# Patient Record
Sex: Male | Born: 1965 | Race: Black or African American | Marital: Single | Smoking: Current every day smoker
Health system: Southern US, Community
[De-identification: ages and names within clinical notes are randomized; demographics above are authoritative.]

## PROBLEM LIST (undated history)

## (undated) ENCOUNTER — Emergency Department (HOSPITAL_COMMUNITY): Payer: Medicare Other | Source: Home / Self Care

## (undated) DIAGNOSIS — R0600 Dyspnea, unspecified: Secondary | ICD-10-CM

## (undated) DIAGNOSIS — R634 Abnormal weight loss: Secondary | ICD-10-CM

## (undated) DIAGNOSIS — R140 Abdominal distension (gaseous): Secondary | ICD-10-CM

## (undated) DIAGNOSIS — W3400XA Accidental discharge from unspecified firearms or gun, initial encounter: Secondary | ICD-10-CM

## (undated) DIAGNOSIS — K219 Gastro-esophageal reflux disease without esophagitis: Secondary | ICD-10-CM

## (undated) DIAGNOSIS — R519 Headache, unspecified: Secondary | ICD-10-CM

## (undated) DIAGNOSIS — R51 Headache: Secondary | ICD-10-CM

## (undated) DIAGNOSIS — S31139A Puncture wound of abdominal wall without foreign body, unspecified quadrant without penetration into peritoneal cavity, initial encounter: Secondary | ICD-10-CM

## (undated) DIAGNOSIS — M7989 Other specified soft tissue disorders: Secondary | ICD-10-CM

## (undated) DIAGNOSIS — R509 Fever, unspecified: Secondary | ICD-10-CM

## (undated) DIAGNOSIS — Z9289 Personal history of other medical treatment: Secondary | ICD-10-CM

## (undated) DIAGNOSIS — R112 Nausea with vomiting, unspecified: Secondary | ICD-10-CM

## (undated) DIAGNOSIS — Z765 Malingerer [conscious simulation]: Secondary | ICD-10-CM

## (undated) DIAGNOSIS — L0291 Cutaneous abscess, unspecified: Secondary | ICD-10-CM

## (undated) DIAGNOSIS — K859 Acute pancreatitis without necrosis or infection, unspecified: Secondary | ICD-10-CM

## (undated) DIAGNOSIS — R197 Diarrhea, unspecified: Secondary | ICD-10-CM

## (undated) DIAGNOSIS — Z87442 Personal history of urinary calculi: Secondary | ICD-10-CM

## (undated) DIAGNOSIS — G8929 Other chronic pain: Secondary | ICD-10-CM

## (undated) DIAGNOSIS — R109 Unspecified abdominal pain: Secondary | ICD-10-CM

## (undated) HISTORY — DX: Fever, unspecified: R50.9

## (undated) HISTORY — DX: Headache: R51

## (undated) HISTORY — PX: HERNIA REPAIR: SHX51

## (undated) HISTORY — DX: Abnormal weight loss: R63.4

## (undated) HISTORY — DX: Headache, unspecified: R51.9

## (undated) HISTORY — DX: Puncture wound of abdominal wall without foreign body, unspecified quadrant without penetration into peritoneal cavity, initial encounter: S31.139A

## (undated) HISTORY — DX: Cutaneous abscess, unspecified: L02.91

## (undated) HISTORY — DX: Other specified soft tissue disorders: M79.89

## (undated) HISTORY — DX: Diarrhea, unspecified: R19.7

## (undated) HISTORY — DX: Nausea with vomiting, unspecified: R11.2

## (undated) HISTORY — DX: Accidental discharge from unspecified firearms or gun, initial encounter: W34.00XA

## (undated) HISTORY — DX: Abdominal distension (gaseous): R14.0

## (undated) HISTORY — PX: ABDOMINAL SURGERY: SHX537

---

## 2005-12-04 ENCOUNTER — Emergency Department (HOSPITAL_COMMUNITY): Admission: EM | Admit: 2005-12-04 | Discharge: 2005-12-04 | Payer: Self-pay | Admitting: Emergency Medicine

## 2006-06-27 ENCOUNTER — Emergency Department (HOSPITAL_COMMUNITY): Admission: EM | Admit: 2006-06-27 | Discharge: 2006-06-27 | Payer: Self-pay | Admitting: Emergency Medicine

## 2007-03-15 ENCOUNTER — Emergency Department (HOSPITAL_COMMUNITY): Admission: EM | Admit: 2007-03-15 | Discharge: 2007-03-15 | Payer: Self-pay | Admitting: Emergency Medicine

## 2007-03-27 ENCOUNTER — Emergency Department (HOSPITAL_COMMUNITY): Admission: EM | Admit: 2007-03-27 | Discharge: 2007-03-27 | Payer: Self-pay | Admitting: Emergency Medicine

## 2007-04-04 ENCOUNTER — Emergency Department (HOSPITAL_COMMUNITY): Admission: EM | Admit: 2007-04-04 | Discharge: 2007-04-05 | Payer: Self-pay | Admitting: Emergency Medicine

## 2007-04-06 ENCOUNTER — Emergency Department (HOSPITAL_COMMUNITY): Admission: EM | Admit: 2007-04-06 | Discharge: 2007-04-07 | Payer: Self-pay | Admitting: Emergency Medicine

## 2007-04-17 ENCOUNTER — Emergency Department (HOSPITAL_COMMUNITY): Admission: EM | Admit: 2007-04-17 | Discharge: 2007-04-18 | Payer: Self-pay | Admitting: Emergency Medicine

## 2007-04-26 ENCOUNTER — Emergency Department (HOSPITAL_COMMUNITY): Admission: EM | Admit: 2007-04-26 | Discharge: 2007-04-26 | Payer: Self-pay | Admitting: Emergency Medicine

## 2007-04-28 ENCOUNTER — Inpatient Hospital Stay (HOSPITAL_COMMUNITY): Admission: EM | Admit: 2007-04-28 | Discharge: 2007-05-01 | Payer: Self-pay | Admitting: Emergency Medicine

## 2007-05-03 ENCOUNTER — Ambulatory Visit: Payer: Self-pay | Admitting: Gastroenterology

## 2007-05-13 ENCOUNTER — Emergency Department (HOSPITAL_COMMUNITY): Admission: EM | Admit: 2007-05-13 | Discharge: 2007-05-13 | Payer: Self-pay | Admitting: Emergency Medicine

## 2007-06-05 ENCOUNTER — Emergency Department (HOSPITAL_COMMUNITY): Admission: EM | Admit: 2007-06-05 | Discharge: 2007-06-05 | Payer: Self-pay | Admitting: Emergency Medicine

## 2007-06-06 ENCOUNTER — Emergency Department (HOSPITAL_COMMUNITY): Admission: EM | Admit: 2007-06-06 | Discharge: 2007-06-06 | Payer: Self-pay | Admitting: Emergency Medicine

## 2007-06-08 ENCOUNTER — Emergency Department (HOSPITAL_COMMUNITY): Admission: EM | Admit: 2007-06-08 | Discharge: 2007-06-08 | Payer: Self-pay | Admitting: Emergency Medicine

## 2007-06-09 ENCOUNTER — Emergency Department (HOSPITAL_COMMUNITY): Admission: EM | Admit: 2007-06-09 | Discharge: 2007-06-09 | Payer: Self-pay | Admitting: Emergency Medicine

## 2007-06-10 ENCOUNTER — Inpatient Hospital Stay (HOSPITAL_COMMUNITY): Admission: EM | Admit: 2007-06-10 | Discharge: 2007-06-11 | Payer: Self-pay | Admitting: Emergency Medicine

## 2007-07-14 ENCOUNTER — Emergency Department (HOSPITAL_COMMUNITY): Admission: EM | Admit: 2007-07-14 | Discharge: 2007-07-14 | Payer: Self-pay | Admitting: Emergency Medicine

## 2007-07-15 ENCOUNTER — Emergency Department (HOSPITAL_COMMUNITY): Admission: EM | Admit: 2007-07-15 | Discharge: 2007-07-15 | Payer: Self-pay | Admitting: Emergency Medicine

## 2007-07-17 ENCOUNTER — Emergency Department (HOSPITAL_COMMUNITY): Admission: EM | Admit: 2007-07-17 | Discharge: 2007-07-17 | Payer: Self-pay | Admitting: Emergency Medicine
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
## 2008-03-18 IMAGING — US US ABDOMEN COMPLETE
1 series · 14 of 25 positions shown · non-contrast
Comparison: none

CLINICAL DATA: Upper abdominal pain for 3 weeks. 
 ABDOMEN ULTRASOUND:
TECHNIQUE: Complete abdominal ultrasound examination was performed including evaluation of the liver, gallbladder, bile ducts, pancreas, kidneys, spleen, IVC, and abdominal aorta.

[Series 1: abdomen · 0.25mm/px · 14 of 80 slices shown]
[im 1/80]
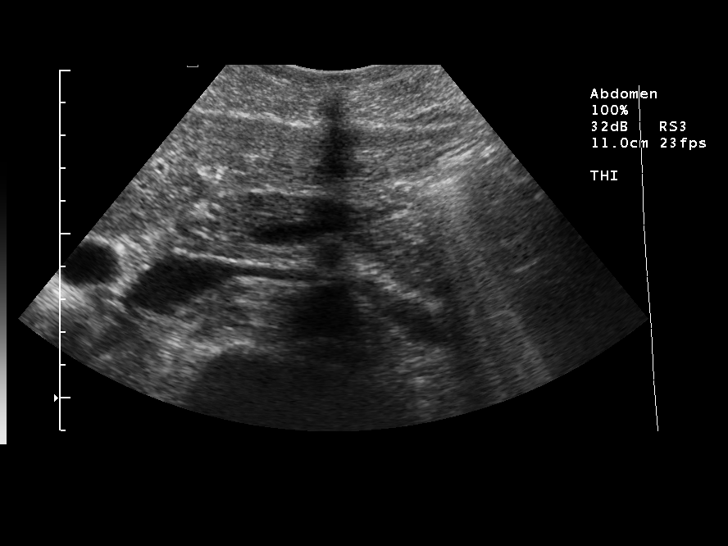
[im 7/80]
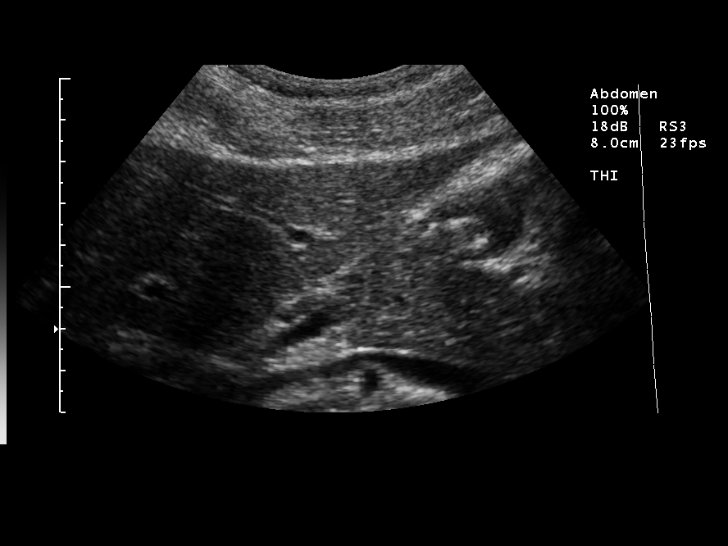
[im 14/80]
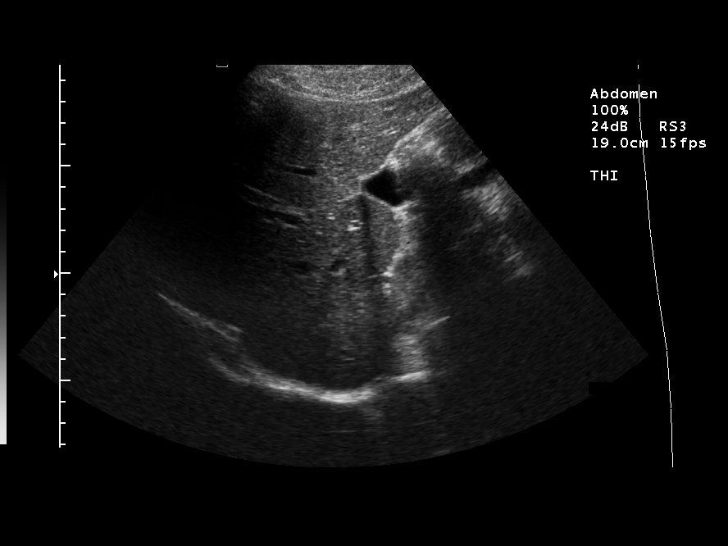
[im 20/80]
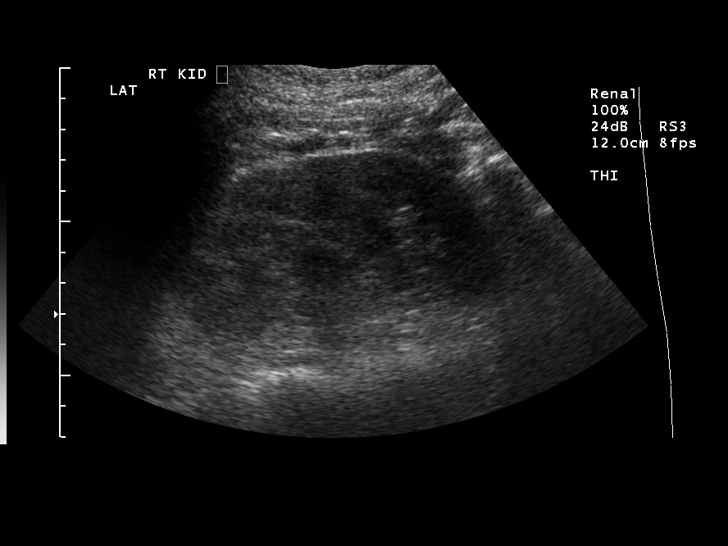
[im 27/80]
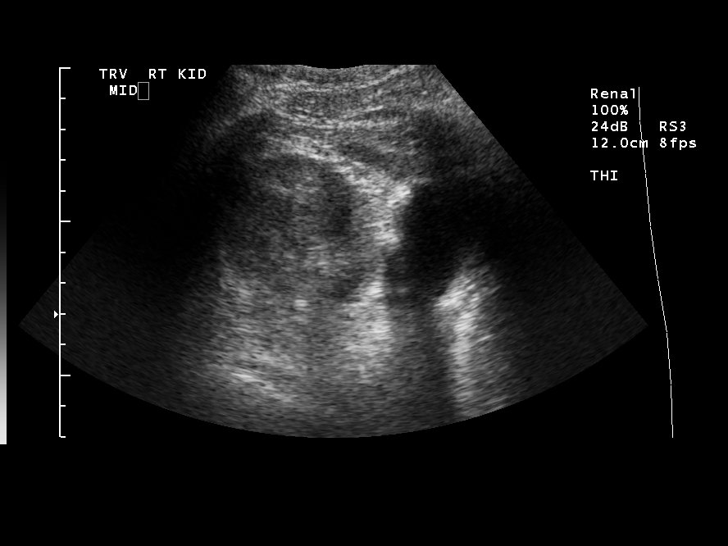
[im 30/80]
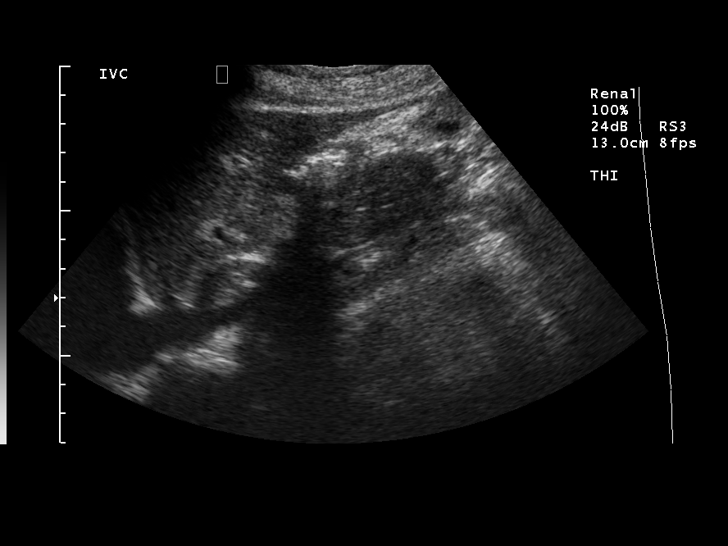
[im 37/80]
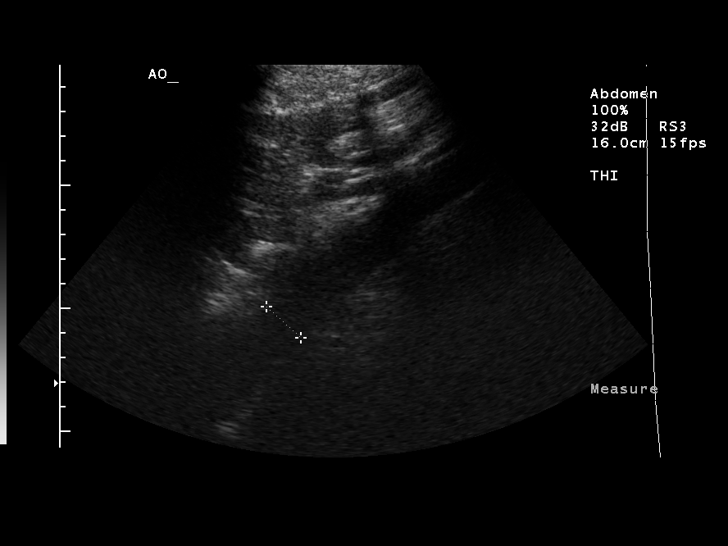
[im 43/80]
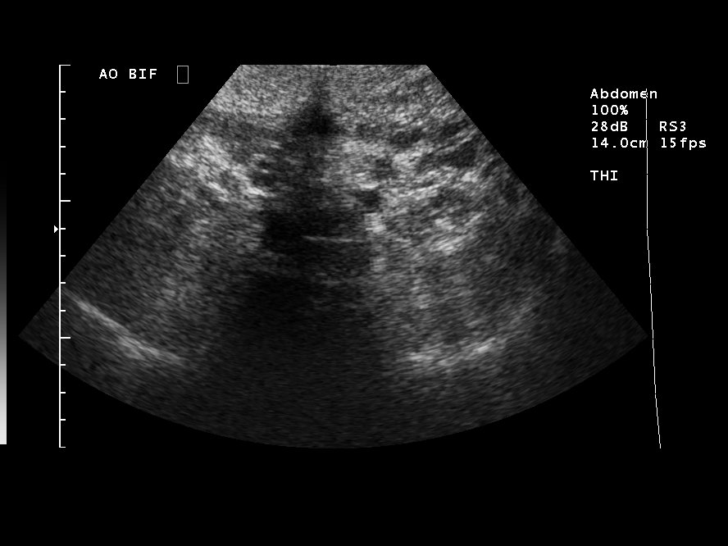
[im 50/80]
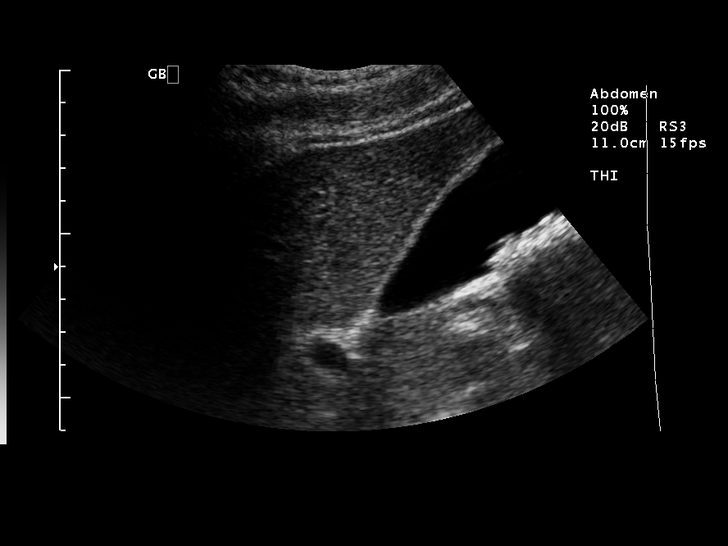
[im 53/80]
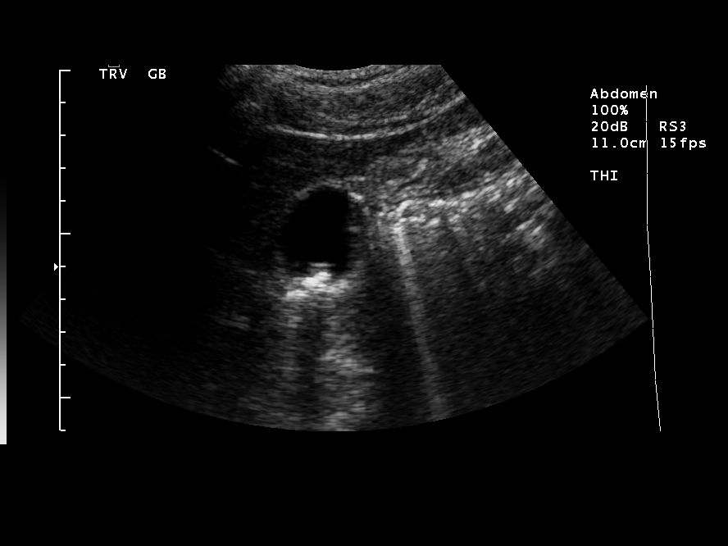
[im 60/80]
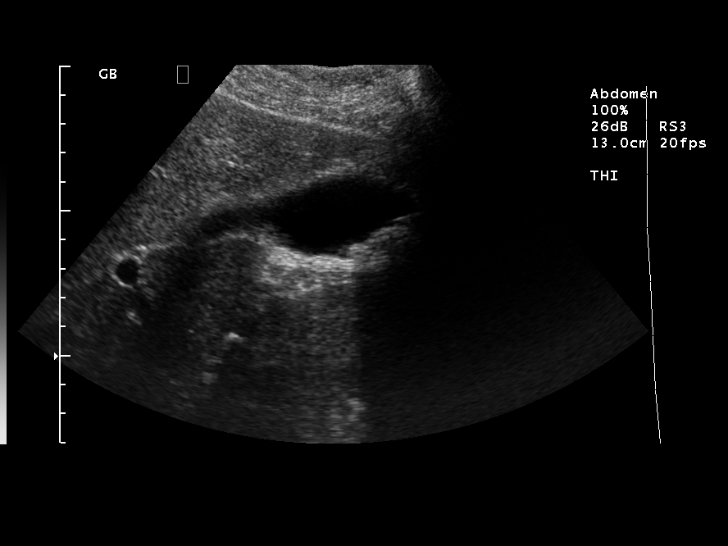
[im 66/80]
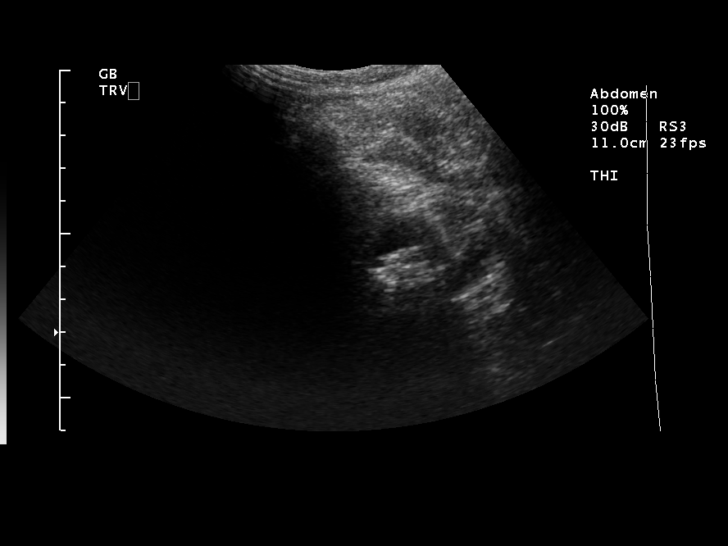
[im 73/80]
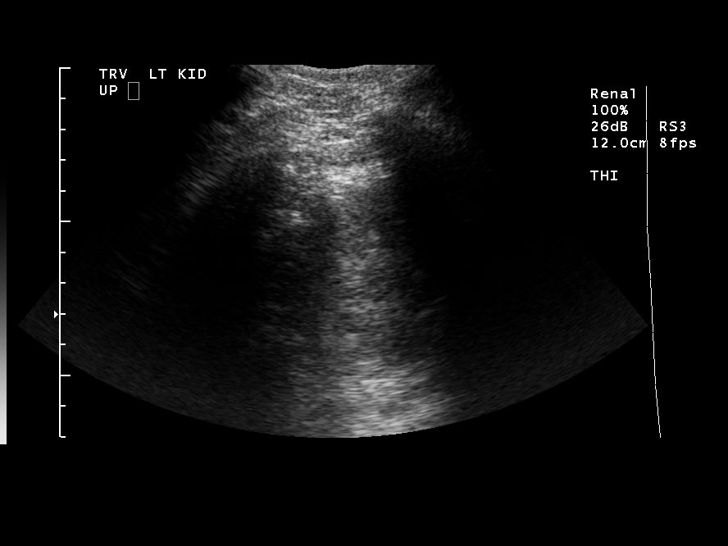
[im 80/80]
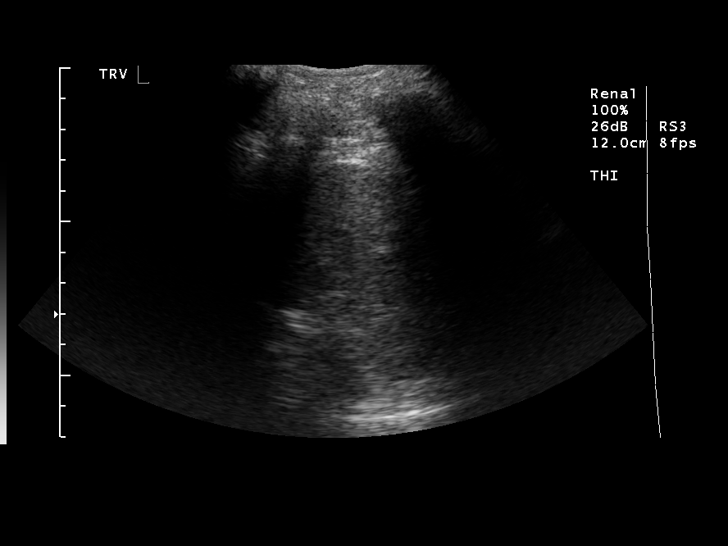

[14 of 25 positions shown; findings below may reference images not displayed]

FINDINGS: There are multiple shadowing gallstones present within the gallbladder. The gallbladder wall thickness measures 3 mm (upper normal).  There is no fluid surrounding the gallbladder.  The common bile duct is normal in caliber measuring 3 mm in diameter.  There are no focal hepatic abnormalities.  The inferior vena cava is patent although difficult to visualize in this patient.   The pancreas has a normal appearance. The entirety of the tail of the pancreas is not visualized.  The spleen, kidneys and abdominal aorta have a normal appearance with the right kidney measuring 10.7 cm in length and the left kidney 11.0 cm in length.
IMPRESSION: Cholelithiasis.  The gallbladder wall thickness is within normal limits and there is no fluid surrounding the gallbladder.  Normal sized common bile duct.
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
## 2008-04-14 ENCOUNTER — Emergency Department (HOSPITAL_COMMUNITY): Admission: EM | Admit: 2008-04-14 | Discharge: 2008-04-14 | Payer: Self-pay | Admitting: Emergency Medicine
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
## 2008-04-21 IMAGING — NM NM HEPATO W/GB/PHARM/[PERSON_NAME]
2 series · 12 of 12 positions shown · non-contrast
Comparison: CT abdomen and pelvis on 04/29/07 reviewed.

CLINICAL DATA: Persistent vomiting and abdominal pain.  
 NUCLEAR MEDICINE HEPATOBILIARY SCAN WITH EJECTION FRACTION:
TECHNIQUE: Sequential abdominal images were obtained following intravenous injection of radiopharmaceutical.  Sequential images were continued following oral ingestion of 8 oz. half-and-half, and the gallbladder ejection fraction was calculated.
 Radiopharmaceutical:  5.3 mCi [id number] Choletec with 8 oz of half-and-half.

[Series 1: he hepatobiliary · 3.21mm/px · 6 of 60 frames shown (1 of 2)]
[frame 6/60]
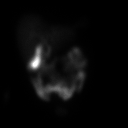
[frame 16/60]
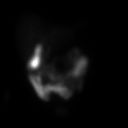
[frame 26/60]
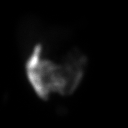
[frame 36/60]
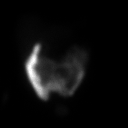
[frame 46/60]
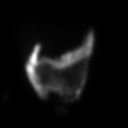
[frame 56/60]
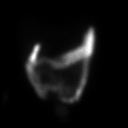

[Series 1: he hepatobiliary · 3.21mm/px · 6 of 46 frames shown (2 of 2)]
[frame 4/46]
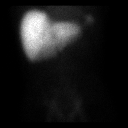
[frame 12/46]
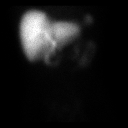
[frame 20/46]
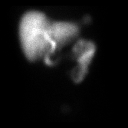
[frame 27/46]
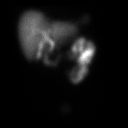
[frame 35/46]
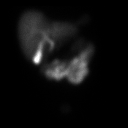
[frame 43/46]
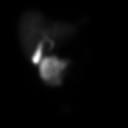

[12 of 12 positions shown; findings below may reference images not displayed]

FINDINGS: The liver demonstrates prompt and diffuse distribution of radiotracer.  The gallbladder is 1st visualized at 25 minutes.  Radiotracer is seen tracking into small bowel.  Gallbladder ejection fraction is 56% at 1 hour after 8 oz of half-and-half with normal for this lab greater than or equal to 50%.
IMPRESSION: Negative for cholecystitis with normal gallbladder ejection fraction.
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
## 2008-05-04 IMAGING — CR DG ABDOMEN ACUTE W/ 1V CHEST
3 series · 3 of 3 positions shown · non-contrast
Comparison: 05/01/07.

CLINICAL DATA: Abdominal pain with nausea, vomiting, and diarrhea.
 ACUTE ABDOMINAL SERIES - 3 VIEW:

[w chest pa]
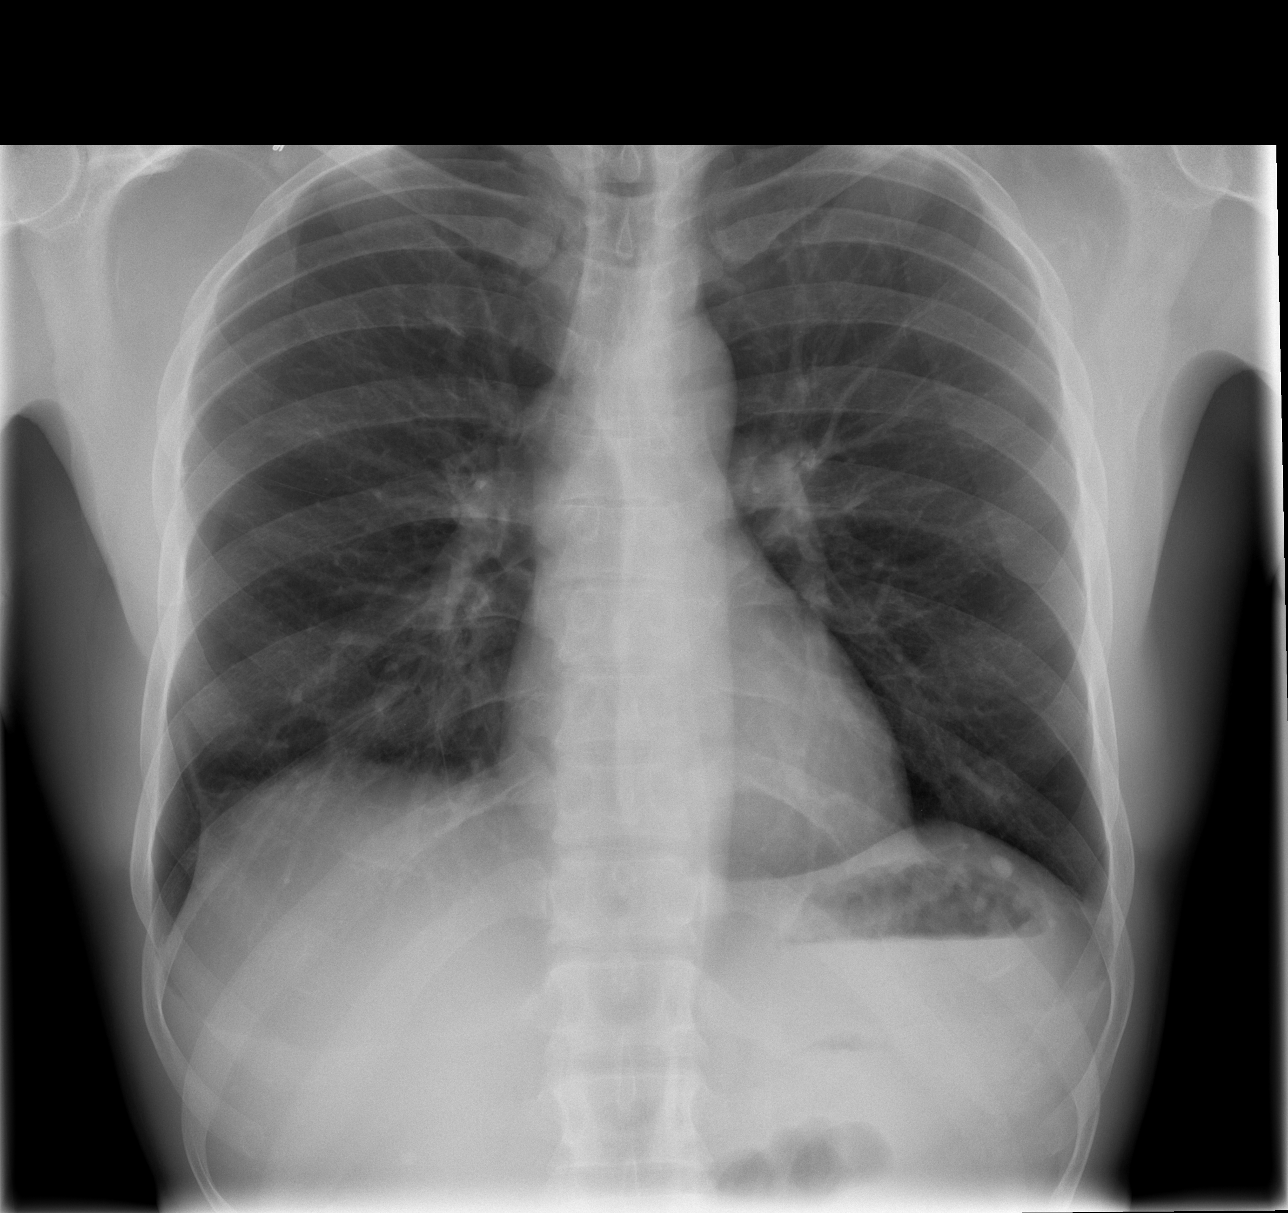

[w abdomen upright *]
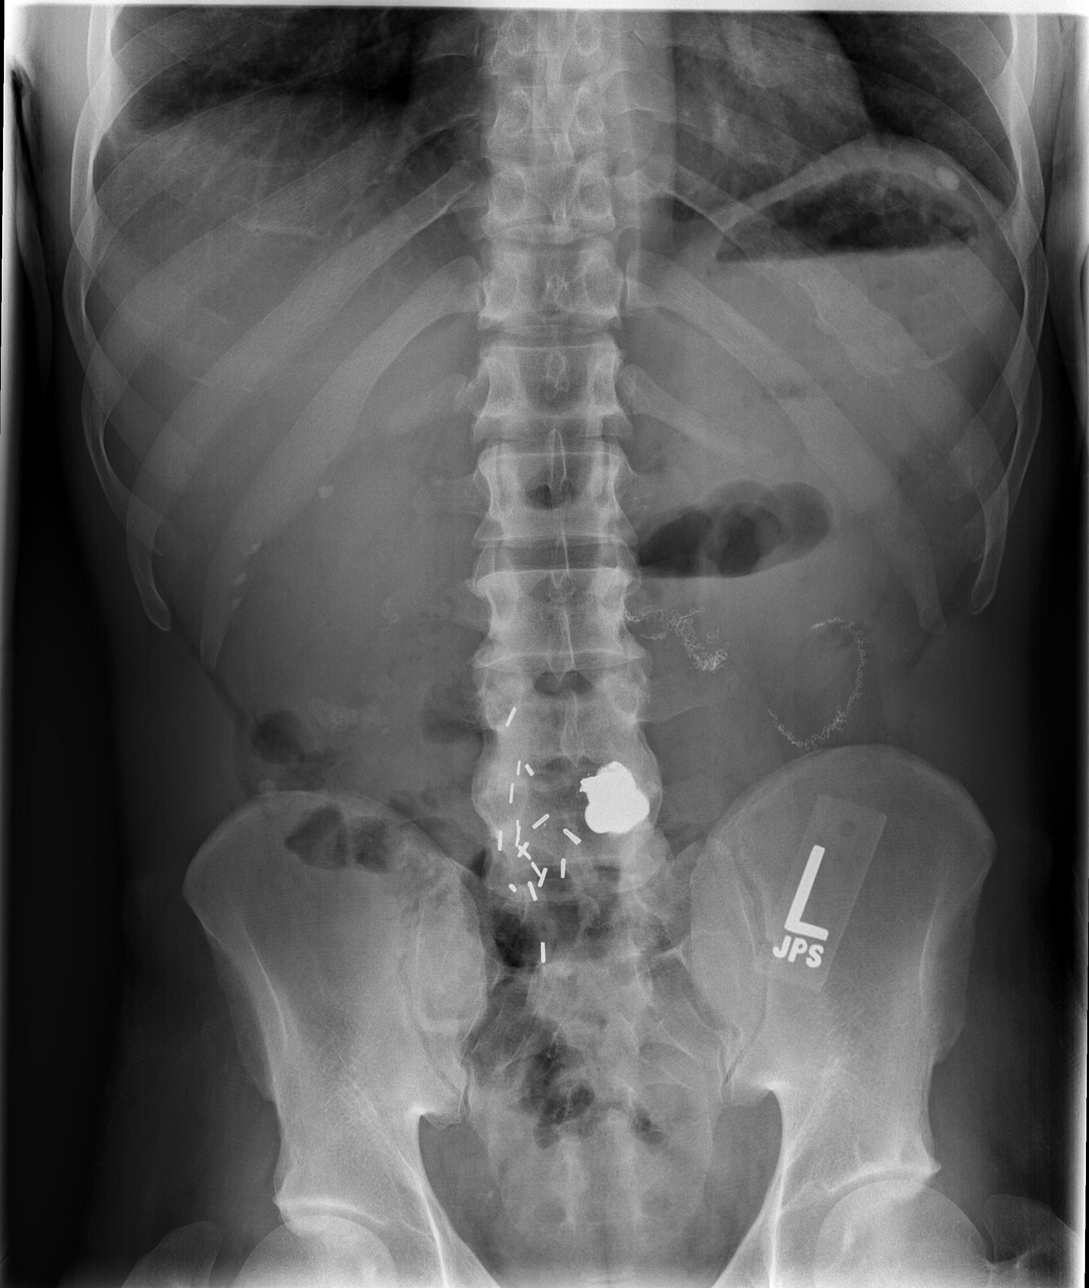

[t abdomen supine]
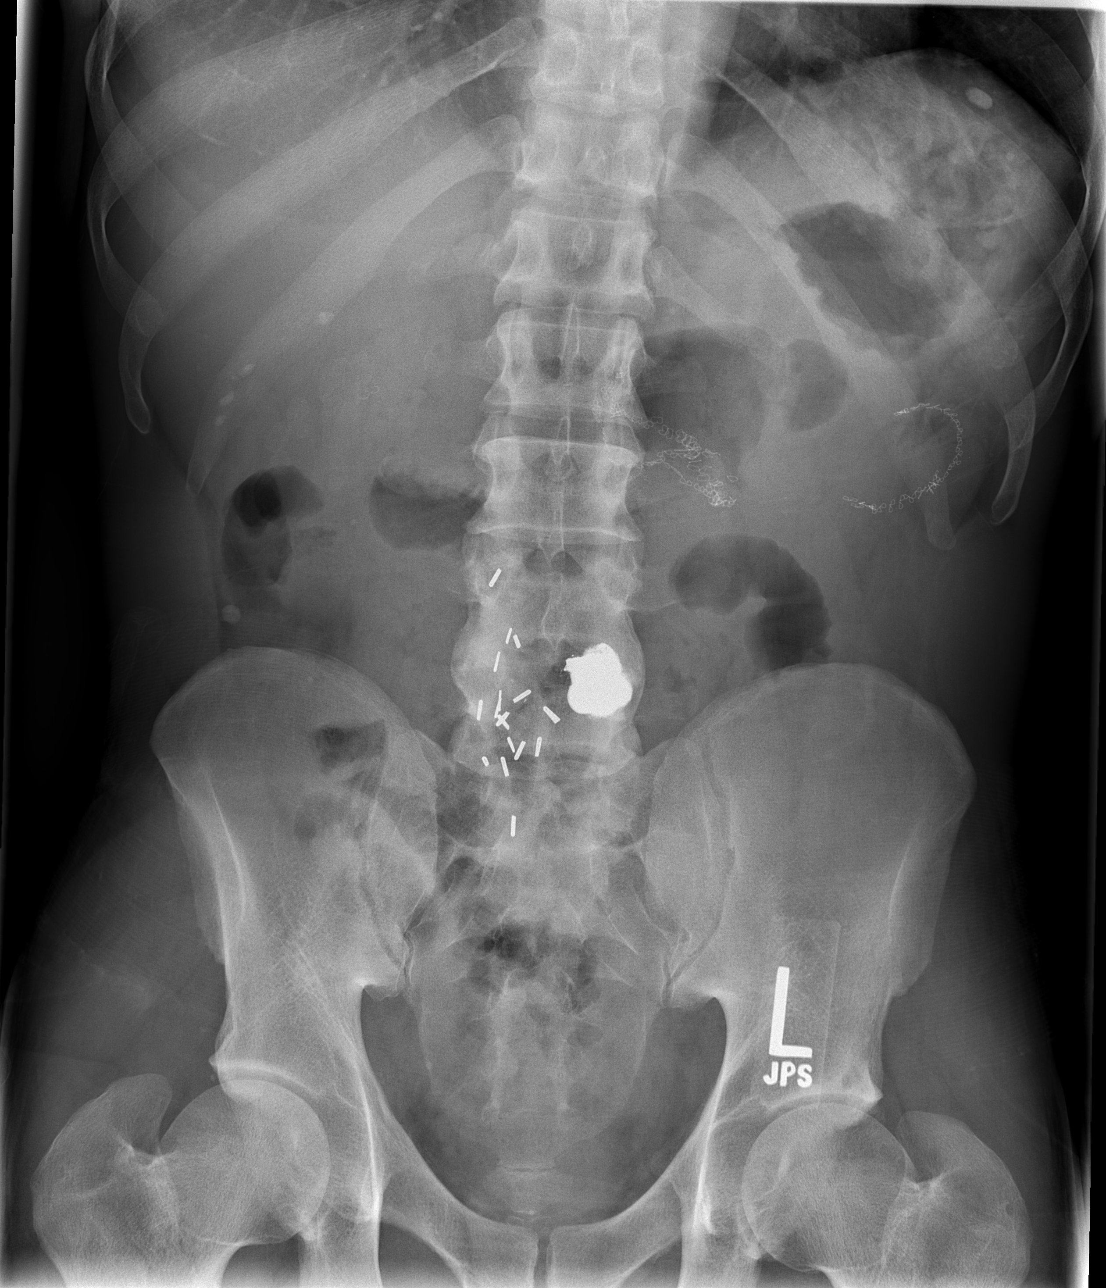

[3 of 3 positions shown; findings below may reference images not displayed]

FINDINGS: Frontal view of the chest shows midline trachea and normal heart size. The lungs are clear with the exception of right basilar scar. 
 Two views of the abdomen show gas and minimally prominent small bowel in the left abdomen. There are air fluid levels. Scattered colonic gas. Surgical changes are seen in the abdomen.
IMPRESSION: Nonspecific bowel gas pattern with partial small bowel obstruction not excluded.
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
## 2008-07-05 IMAGING — CR DG ABDOMEN ACUTE W/ 1V CHEST
3 series · 3 of 3 positions shown · non-contrast
Comparison: none

CLINICAL DATA: Abdominal pain, vomiting.
 ACUTE ABDOMINAL SERIES WITH CHEST ? 3 VIEW:

[w chest pa]
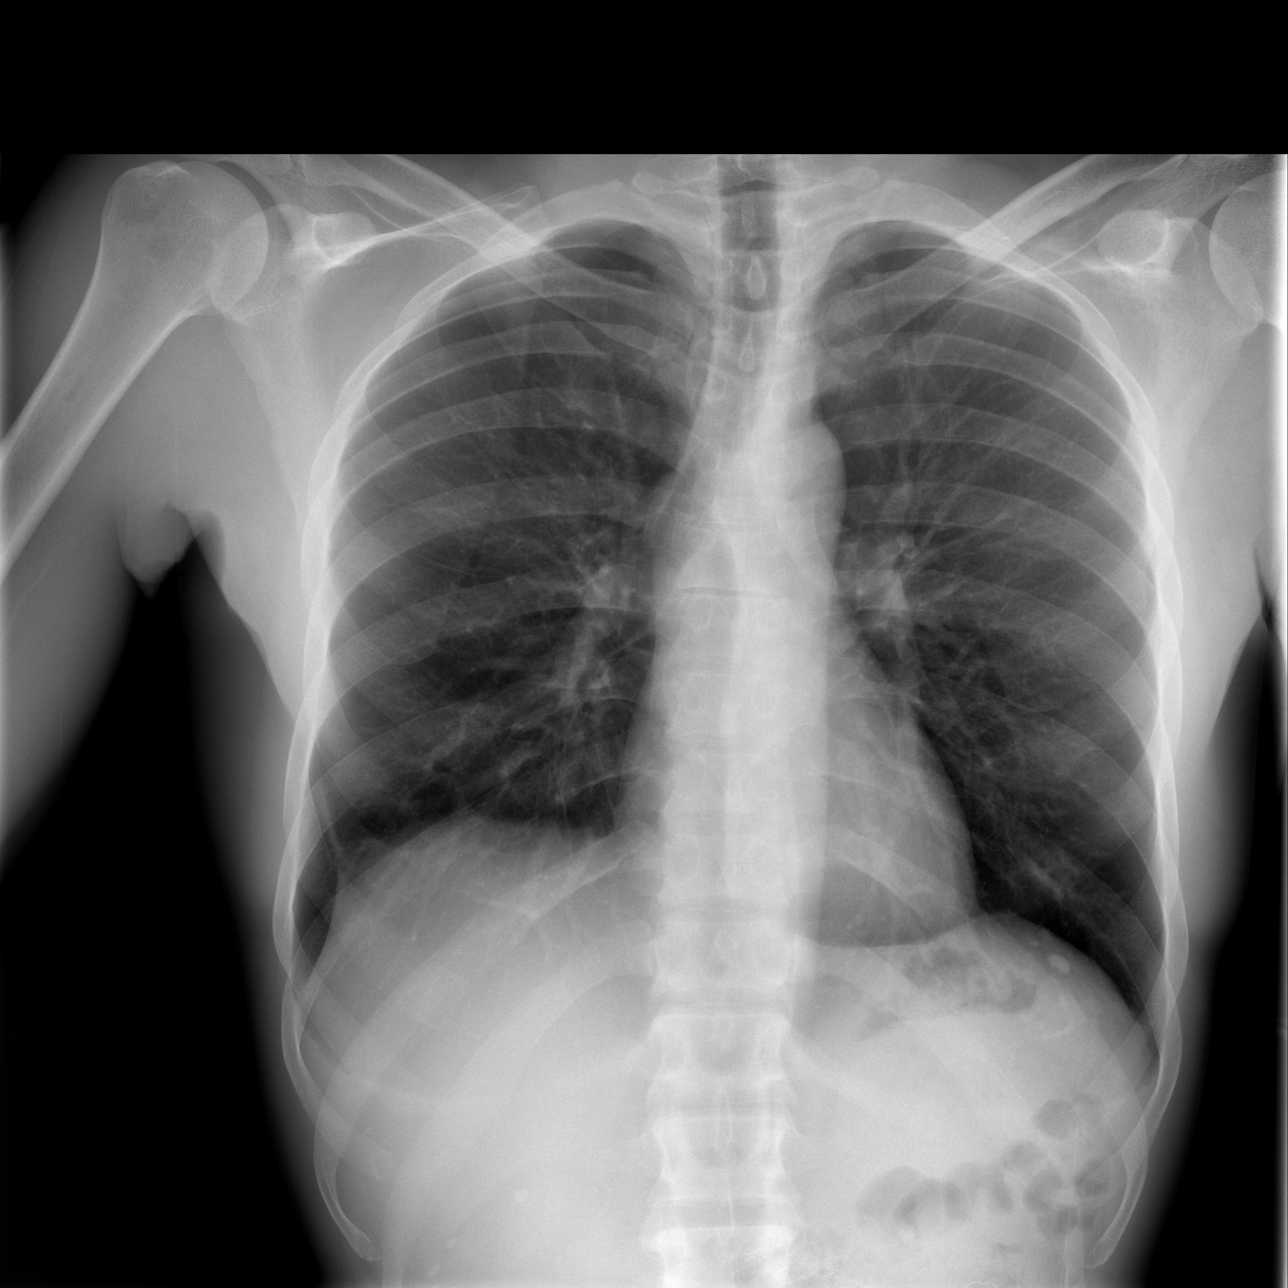

[w abdomen upright]
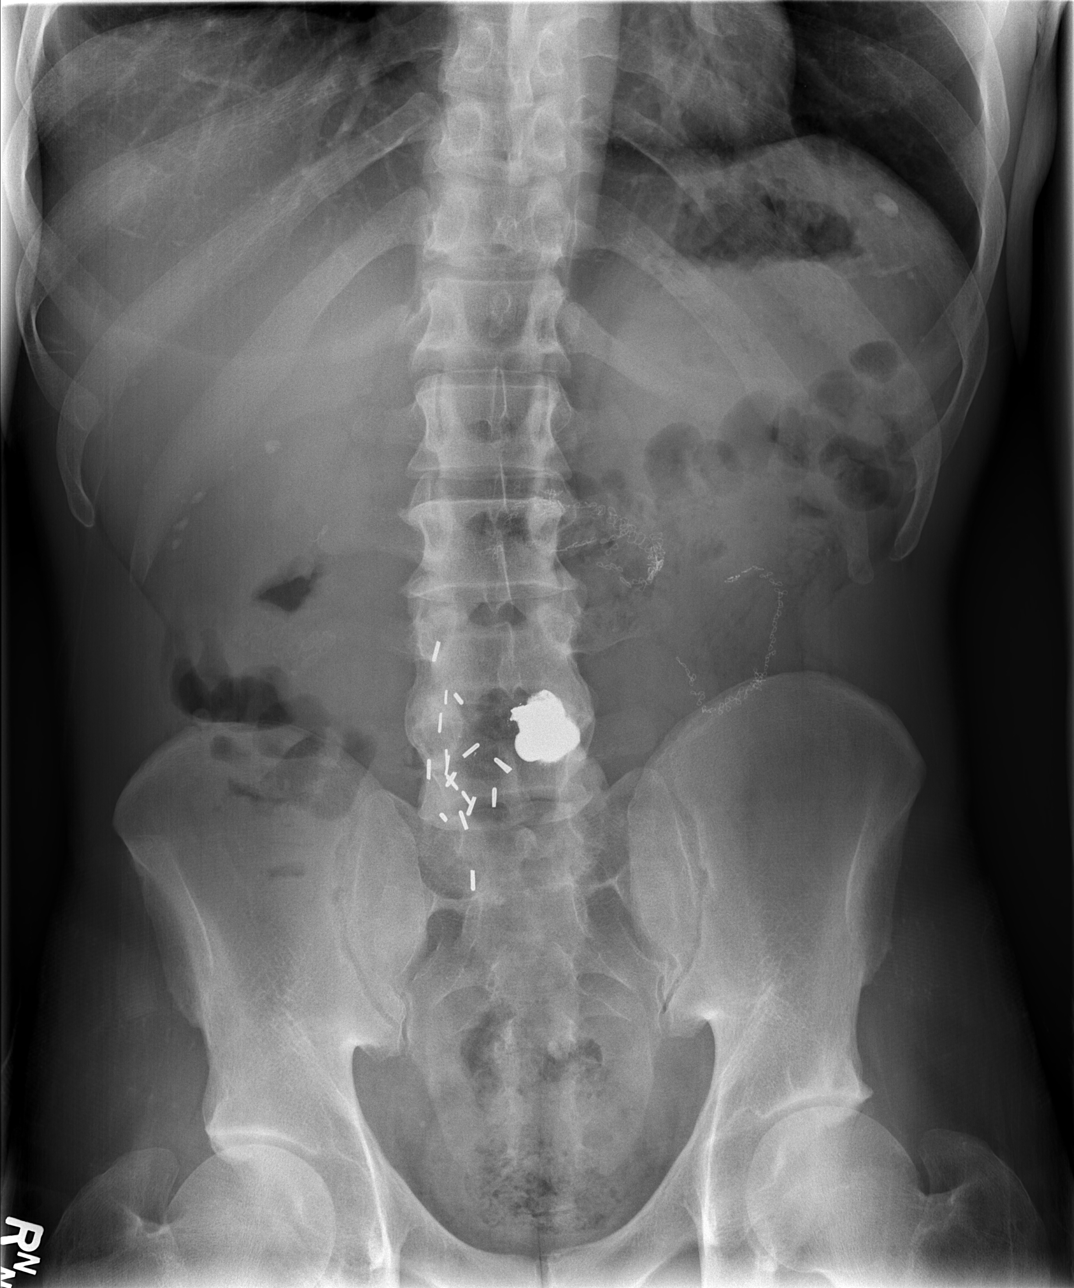

[t abdomen supine]
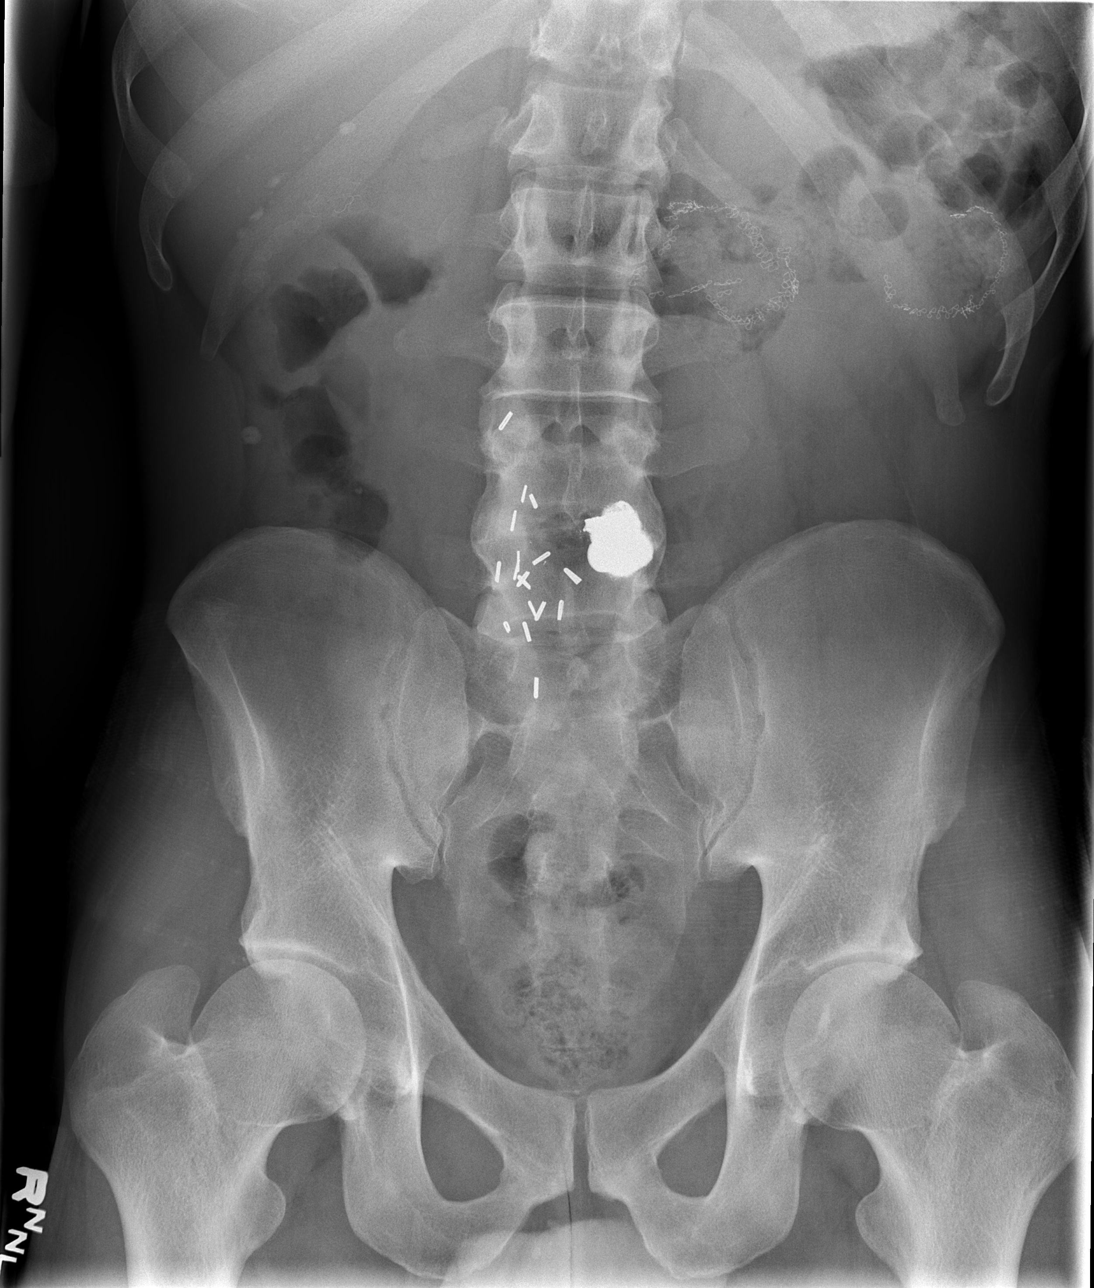

[3 of 3 positions shown; findings below may reference images not displayed]

FINDINGS: Negative for acute cardiopulmonary disease.  Surgical staple left mid abdomen.  A cluster of metallic surgical clips projecting just to the right of midline from L4 to S1.  Apparent metallic density projects over the left aspect of L4-5.  Bowel gas pattern unremarkable.  No findings to suggest ileus or bowel obstruction.
IMPRESSION: No acute chest or abdominal disease.
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
## 2010-04-24 ENCOUNTER — Emergency Department (HOSPITAL_COMMUNITY): Admission: EM | Admit: 2010-04-24 | Discharge: 2010-04-25 | Payer: Self-pay | Admitting: Emergency Medicine

## 2010-04-24 ENCOUNTER — Emergency Department (HOSPITAL_COMMUNITY): Admission: EM | Admit: 2010-04-24 | Discharge: 2010-04-24 | Payer: Self-pay | Admitting: Family Medicine

## 2010-04-26 ENCOUNTER — Emergency Department (HOSPITAL_COMMUNITY): Admission: EM | Admit: 2010-04-26 | Discharge: 2010-04-26 | Payer: Self-pay | Admitting: Emergency Medicine

## 2010-09-30 ENCOUNTER — Emergency Department (HOSPITAL_COMMUNITY): Admission: EM | Admit: 2010-09-30 | Discharge: 2010-09-30 | Payer: Self-pay | Admitting: Emergency Medicine

## 2010-10-02 ENCOUNTER — Emergency Department (HOSPITAL_COMMUNITY): Admission: EM | Admit: 2010-10-02 | Discharge: 2010-10-02 | Payer: Self-pay | Admitting: Emergency Medicine

## 2010-10-03 ENCOUNTER — Emergency Department (HOSPITAL_COMMUNITY): Admission: EM | Admit: 2010-10-03 | Discharge: 2010-10-04 | Payer: Self-pay | Admitting: Emergency Medicine

## 2010-10-05 ENCOUNTER — Emergency Department (HOSPITAL_COMMUNITY): Admission: EM | Admit: 2010-10-05 | Discharge: 2010-10-05 | Payer: Self-pay | Admitting: Emergency Medicine

## 2010-10-07 ENCOUNTER — Encounter (INDEPENDENT_AMBULATORY_CARE_PROVIDER_SITE_OTHER): Payer: Self-pay

## 2010-10-07 HISTORY — PX: CHOLECYSTECTOMY: SHX55

## 2010-10-14 ENCOUNTER — Emergency Department (HOSPITAL_COMMUNITY): Admission: EM | Admit: 2010-10-14 | Discharge: 2010-10-15 | Payer: Self-pay | Admitting: Emergency Medicine

## 2010-10-16 ENCOUNTER — Emergency Department (HOSPITAL_COMMUNITY): Admission: EM | Admit: 2010-10-16 | Discharge: 2010-10-16 | Payer: Self-pay | Admitting: Emergency Medicine

## 2010-11-10 ENCOUNTER — Inpatient Hospital Stay (HOSPITAL_COMMUNITY): Admission: EM | Admit: 2010-11-10 | Discharge: 2010-10-10 | Payer: Self-pay | Admitting: Emergency Medicine

## 2010-12-01 ENCOUNTER — Emergency Department (HOSPITAL_COMMUNITY)
Admission: EM | Admit: 2010-12-01 | Discharge: 2010-12-01 | Payer: Self-pay | Source: Home / Self Care | Admitting: Occupational Therapy

## 2010-12-02 ENCOUNTER — Inpatient Hospital Stay (HOSPITAL_COMMUNITY)
Admission: EM | Admit: 2010-12-02 | Discharge: 2010-12-04 | Payer: Self-pay | Source: Home / Self Care | Attending: Internal Medicine | Admitting: Internal Medicine
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
## 2011-01-09 ENCOUNTER — Emergency Department (HOSPITAL_COMMUNITY): Payer: Self-pay

## 2011-01-09 ENCOUNTER — Emergency Department (HOSPITAL_COMMUNITY)
Admission: EM | Admit: 2011-01-09 | Discharge: 2011-01-09 | Disposition: A | Discharge status: Home / Self Care | Payer: Self-pay | Attending: Emergency Medicine | Admitting: Emergency Medicine

## 2011-01-09 DIAGNOSIS — R109 Unspecified abdominal pain: Secondary | ICD-10-CM | POA: Insufficient documentation

## 2011-01-09 DIAGNOSIS — R112 Nausea with vomiting, unspecified: Secondary | ICD-10-CM | POA: Insufficient documentation

## 2011-01-09 LAB — COMPREHENSIVE METABOLIC PANEL
ALT: 29 U/L (ref 0–53)
AST: 38 U/L — ABNORMAL HIGH (ref 0–37)
Albumin: 4.2 g/dL (ref 3.5–5.2)
Alkaline Phosphatase: 64 U/L (ref 39–117)
BUN: 13 mg/dL (ref 6–23)
CO2: 19 mEq/L (ref 19–32)
Calcium: 9.7 mg/dL (ref 8.4–10.5)
Chloride: 107 mEq/L (ref 96–112)
Creatinine, Ser: 1.05 mg/dL (ref 0.4–1.5)
GFR calc Af Amer: >60 mL/min (ref >60)
GFR calc non Af Amer: >60 mL/min (ref >60)
Glucose, Bld: 96 mg/dL (ref 70–99)
Potassium: 4.2 mEq/L (ref 3.5–5.1)
Sodium: 144 mEq/L (ref 135–145)
Total Bilirubin: 0.6 mg/dL (ref 0.3–1.2)
Total Protein: 8.2 g/dL (ref 6.0–8.3)

## 2011-01-09 LAB — URINE MICROSCOPIC-ADD ON

## 2011-01-09 LAB — URINALYSIS, ROUTINE W REFLEX MICROSCOPIC
Hgb urine dipstick: NEGATIVE
Ketones, ur: 40 mg/dL — AB
Nitrite: NEGATIVE
Protein, ur: 30 mg/dL — AB
Specific Gravity, Urine: 1.038 — ABNORMAL HIGH (ref 1.005–1.030)
Urine Glucose, Fasting: NEGATIVE mg/dL
Urobilinogen, UA: 1 mg/dL (ref 0.0–1.0)
pH: 6 (ref 5.0–8.0)

## 2011-01-09 LAB — CBC
HCT: 44.3 % (ref 39.0–52.0)
Hemoglobin: 15.4 g/dL (ref 13.0–17.0)
MCH: 31 pg (ref 26.0–34.0)
MCHC: 34.8 g/dL (ref 30.0–36.0)
MCV: 89.3 fL (ref 78.0–100.0)
Platelets: 197 10*3/uL (ref 150–400)
RBC: 4.96 MIL/uL (ref 4.22–5.81)
RDW: 13.6 % (ref 11.5–15.5)
WBC: 9.5 10*3/uL (ref 4.0–10.5)

## 2011-01-09 LAB — DIFFERENTIAL
Basophils Absolute: 0 10*3/uL (ref 0.0–0.1)
Basophils Relative: 0 % (ref 0–1)
Eosinophils Absolute: 0 10*3/uL (ref 0.0–0.7)
Eosinophils Relative: 0 % (ref 0–5)
Lymphocytes Relative: 14 % (ref 12–46)
Lymphs Abs: 1.3 10*3/uL (ref 0.7–4.0)
Monocytes Absolute: 0.6 10*3/uL (ref 0.1–1.0)
Monocytes Relative: 6 % (ref 3–12)
Neutro Abs: 7.6 10*3/uL (ref 1.7–7.7)
Neutrophils Relative %: 80 % — ABNORMAL HIGH (ref 43–77)

## 2011-01-09 LAB — LIPASE, BLOOD: Lipase: 23 U/L (ref 11–59)

## 2011-01-10 ENCOUNTER — Emergency Department (HOSPITAL_COMMUNITY): Payer: Self-pay

## 2011-01-10 ENCOUNTER — Emergency Department (HOSPITAL_COMMUNITY)
Admission: EM | Admit: 2011-01-10 | Discharge: 2011-01-10 | Disposition: A | Discharge status: Home / Self Care | Payer: Self-pay | Attending: Emergency Medicine | Admitting: Emergency Medicine

## 2011-01-10 DIAGNOSIS — R1011 Right upper quadrant pain: Secondary | ICD-10-CM | POA: Insufficient documentation

## 2011-01-10 DIAGNOSIS — R197 Diarrhea, unspecified: Secondary | ICD-10-CM | POA: Insufficient documentation

## 2011-01-10 DIAGNOSIS — R112 Nausea with vomiting, unspecified: Secondary | ICD-10-CM | POA: Insufficient documentation

## 2011-01-10 LAB — LIPASE, BLOOD: Lipase: 19 U/L (ref 11–59)

## 2011-01-10 LAB — RAPID URINE DRUG SCREEN, HOSP PERFORMED
Amphetamines: NOT DETECTED
Barbiturates: NOT DETECTED
Benzodiazepines: NOT DETECTED
Cocaine: NOT DETECTED
Opiates: POSITIVE — AB
Tetrahydrocannabinol: POSITIVE — AB

## 2011-01-10 LAB — LACTIC ACID, PLASMA: Lactic Acid, Venous: 1.5 mmol/L (ref 0.5–2.2)

## 2011-01-10 LAB — DIFFERENTIAL
Basophils Absolute: 0 10*3/uL (ref 0.0–0.1)
Basophils Relative: 0 % (ref 0–1)
Eosinophils Absolute: 0 10*3/uL (ref 0.0–0.7)
Eosinophils Relative: 0 % (ref 0–5)
Lymphocytes Relative: 18 % (ref 12–46)
Lymphs Abs: 1.5 10*3/uL (ref 0.7–4.0)
Monocytes Absolute: 0.5 10*3/uL (ref 0.1–1.0)
Monocytes Relative: 6 % (ref 3–12)
Neutro Abs: 6.4 10*3/uL (ref 1.7–7.7)
Neutrophils Relative %: 75 % (ref 43–77)

## 2011-01-10 LAB — COMPREHENSIVE METABOLIC PANEL
ALT: 31 U/L (ref 0–53)
AST: 50 U/L — ABNORMAL HIGH (ref 0–37)
Albumin: 3.8 g/dL (ref 3.5–5.2)
Alkaline Phosphatase: 52 U/L (ref 39–117)
BUN: 9 mg/dL (ref 6–23)
CO2: 24 mEq/L (ref 19–32)
Calcium: 9.5 mg/dL (ref 8.4–10.5)
Chloride: 106 mEq/L (ref 96–112)
Creatinine, Ser: 1.14 mg/dL (ref 0.4–1.5)
GFR calc Af Amer: >60 mL/min (ref >60)
GFR calc non Af Amer: >60 mL/min (ref >60)
Glucose, Bld: 114 mg/dL — ABNORMAL HIGH (ref 70–99)
Potassium: 3.9 mEq/L (ref 3.5–5.1)
Sodium: 140 mEq/L (ref 135–145)
Total Bilirubin: 0.7 mg/dL (ref 0.3–1.2)
Total Protein: 7.3 g/dL (ref 6.0–8.3)

## 2011-01-10 LAB — CBC
HCT: 38.6 % — ABNORMAL LOW (ref 39.0–52.0)
Hemoglobin: 13.4 g/dL (ref 13.0–17.0)
MCH: 30.9 pg (ref 26.0–34.0)
MCHC: 34.7 g/dL (ref 30.0–36.0)
MCV: 89.1 fL (ref 78.0–100.0)
Platelets: 211 10*3/uL (ref 150–400)
RBC: 4.33 MIL/uL (ref 4.22–5.81)
RDW: 13.4 % (ref 11.5–15.5)
WBC: 8.5 10*3/uL (ref 4.0–10.5)

## 2011-02-13 ENCOUNTER — Emergency Department (HOSPITAL_COMMUNITY): Payer: Self-pay

## 2011-02-13 ENCOUNTER — Emergency Department (HOSPITAL_COMMUNITY)
Admission: EM | Admit: 2011-02-13 | Discharge: 2011-02-13 | Disposition: A | Discharge status: Home / Self Care | Payer: Self-pay | Attending: Emergency Medicine | Admitting: Emergency Medicine

## 2011-02-13 DIAGNOSIS — R112 Nausea with vomiting, unspecified: Secondary | ICD-10-CM | POA: Insufficient documentation

## 2011-02-13 DIAGNOSIS — R109 Unspecified abdominal pain: Secondary | ICD-10-CM | POA: Insufficient documentation

## 2011-02-13 LAB — URINALYSIS, ROUTINE W REFLEX MICROSCOPIC
Glucose, UA: NEGATIVE mg/dL
Glucose, UA: NEGATIVE mg/dL
Hgb urine dipstick: NEGATIVE
Hgb urine dipstick: NEGATIVE
Ketones, ur: 15 mg/dL — AB
Ketones, ur: 15 mg/dL — AB
Nitrite: POSITIVE — AB
Nitrite: POSITIVE — AB
Protein, ur: 30 mg/dL — AB
Protein, ur: 30 mg/dL — AB
Specific Gravity, Urine: 1.04 — ABNORMAL HIGH (ref 1.005–1.030)
Specific Gravity, Urine: 1.044 — ABNORMAL HIGH (ref 1.005–1.030)
Urobilinogen, UA: 0.2 mg/dL (ref 0.0–1.0)
Urobilinogen, UA: 1 mg/dL (ref 0.0–1.0)
pH: 5.5 (ref 5.0–8.0)
pH: 5.5 (ref 5.0–8.0)

## 2011-02-13 LAB — CBC
HCT: 39.2 % (ref 39.0–52.0)
HCT: 37.2 % — ABNORMAL LOW (ref 39.0–52.0)
HCT: 37.3 % — ABNORMAL LOW (ref 39.0–52.0)
HCT: 37.7 % — ABNORMAL LOW (ref 39.0–52.0)
HCT: 44.8 % (ref 39.0–52.0)
Hemoglobin: 13.6 g/dL (ref 13.0–17.0)
Hemoglobin: 12.5 g/dL — ABNORMAL LOW (ref 13.0–17.0)
Hemoglobin: 12.7 g/dL — ABNORMAL LOW (ref 13.0–17.0)
Hemoglobin: 13.1 g/dL (ref 13.0–17.0)
Hemoglobin: 15.5 g/dL (ref 13.0–17.0)
MCH: 31.1 pg (ref 26.0–34.0)
MCH: 30.3 pg (ref 26.0–34.0)
MCH: 30.5 pg (ref 26.0–34.0)
MCH: 30.8 pg (ref 26.0–34.0)
MCH: 30.9 pg (ref 26.0–34.0)
MCHC: 34.7 g/dL (ref 30.0–36.0)
MCHC: 33.5 g/dL (ref 30.0–36.0)
MCHC: 34.1 g/dL (ref 30.0–36.0)
MCHC: 34.6 g/dL (ref 30.0–36.0)
MCHC: 34.7 g/dL (ref 30.0–36.0)
MCV: 89.5 fL (ref 78.0–100.0)
MCV: 88.9 fL (ref 78.0–100.0)
MCV: 89.1 fL (ref 78.0–100.0)
MCV: 89.4 fL (ref 78.0–100.0)
MCV: 90.3 fL (ref 78.0–100.0)
Platelets: 178 10*3/uL (ref 150–400)
Platelets: 240 10*3/uL (ref 150–400)
Platelets: 241 10*3/uL (ref 150–400)
Platelets: 256 10*3/uL (ref 150–400)
Platelets: 260 10*3/uL (ref 150–400)
RBC: 4.38 MIL/uL (ref 4.22–5.81)
RBC: 4.13 MIL/uL — ABNORMAL LOW (ref 4.22–5.81)
RBC: 4.16 MIL/uL — ABNORMAL LOW (ref 4.22–5.81)
RBC: 4.24 MIL/uL (ref 4.22–5.81)
RBC: 5.03 MIL/uL (ref 4.22–5.81)
RDW: 13.4 % (ref 11.5–15.5)
RDW: 13.5 % (ref 11.5–15.5)
RDW: 13.6 % (ref 11.5–15.5)
RDW: 13.7 % (ref 11.5–15.5)
RDW: 13.8 % (ref 11.5–15.5)
WBC: 7.4 10*3/uL (ref 4.0–10.5)
WBC: 7.1 10*3/uL (ref 4.0–10.5)
WBC: 7.9 10*3/uL (ref 4.0–10.5)
WBC: 8 10*3/uL (ref 4.0–10.5)
WBC: 8.3 10*3/uL (ref 4.0–10.5)

## 2011-02-13 LAB — URINE MICROSCOPIC-ADD ON

## 2011-02-13 LAB — COMPREHENSIVE METABOLIC PANEL
ALT: 20 U/L (ref 0–53)
ALT: 19 U/L (ref 0–53)
ALT: 22 U/L (ref 0–53)
ALT: 33 U/L (ref 0–53)
AST: 23 U/L (ref 0–37)
AST: 24 U/L (ref 0–37)
AST: 41 U/L — ABNORMAL HIGH (ref 0–37)
AST: 49 U/L — ABNORMAL HIGH (ref 0–37)
Albumin: 3.8 g/dL (ref 3.5–5.2)
Albumin: 3.4 g/dL — ABNORMAL LOW (ref 3.5–5.2)
Albumin: 3.5 g/dL (ref 3.5–5.2)
Albumin: 4.9 g/dL (ref 3.5–5.2)
Alkaline Phosphatase: 48 U/L (ref 39–117)
Alkaline Phosphatase: 41 U/L (ref 39–117)
Alkaline Phosphatase: 62 U/L (ref 39–117)
Alkaline Phosphatase: 63 U/L (ref 39–117)
BUN: 8 mg/dL (ref 6–23)
BUN: 4 mg/dL — ABNORMAL LOW (ref 6–23)
BUN: 8 mg/dL (ref 6–23)
BUN: 9 mg/dL (ref 6–23)
CO2: 26 mEq/L (ref 19–32)
CO2: 23 mEq/L (ref 19–32)
CO2: 24 mEq/L (ref 19–32)
CO2: 26 mEq/L (ref 19–32)
Calcium: 9.1 mg/dL (ref 8.4–10.5)
Calcium: 10 mg/dL (ref 8.4–10.5)
Calcium: 8.6 mg/dL (ref 8.4–10.5)
Calcium: 9.9 mg/dL (ref 8.4–10.5)
Chloride: 109 mEq/L (ref 96–112)
Chloride: 100 mEq/L (ref 96–112)
Chloride: 101 mEq/L (ref 96–112)
Chloride: 107 mEq/L (ref 96–112)
Creatinine, Ser: 1.07 mg/dL (ref 0.4–1.5)
Creatinine, Ser: 0.9 mg/dL (ref 0.4–1.5)
Creatinine, Ser: 0.99 mg/dL (ref 0.4–1.5)
Creatinine, Ser: 1.03 mg/dL (ref 0.4–1.5)
GFR calc Af Amer: >60 mL/min (ref >60)
GFR calc Af Amer: >60 mL/min (ref >60)
GFR calc Af Amer: >60 mL/min (ref >60)
GFR calc Af Amer: >60 mL/min (ref >60)
GFR calc non Af Amer: >60 mL/min (ref >60)
GFR calc non Af Amer: >60 mL/min (ref >60)
GFR calc non Af Amer: >60 mL/min (ref >60)
GFR calc non Af Amer: >60 mL/min (ref >60)
Glucose, Bld: 115 mg/dL — ABNORMAL HIGH (ref 70–99)
Glucose, Bld: 103 mg/dL — ABNORMAL HIGH (ref 70–99)
Glucose, Bld: 112 mg/dL — ABNORMAL HIGH (ref 70–99)
Glucose, Bld: 116 mg/dL — ABNORMAL HIGH (ref 70–99)
Potassium: 3.7 mEq/L (ref 3.5–5.1)
Potassium: 4.2 mEq/L (ref 3.5–5.1)
Potassium: 4.3 mEq/L (ref 3.5–5.1)
Potassium: 4.9 mEq/L (ref 3.5–5.1)
Sodium: 142 mEq/L (ref 135–145)
Sodium: 135 mEq/L (ref 135–145)
Sodium: 136 mEq/L (ref 135–145)
Sodium: 136 mEq/L (ref 135–145)
Total Bilirubin: 0.6 mg/dL (ref 0.3–1.2)
Total Bilirubin: 0.6 mg/dL (ref 0.3–1.2)
Total Bilirubin: 0.8 mg/dL (ref 0.3–1.2)
Total Bilirubin: 0.9 mg/dL (ref 0.3–1.2)
Total Protein: 6.8 g/dL (ref 6.0–8.3)
Total Protein: 5.8 g/dL — ABNORMAL LOW (ref 6.0–8.3)
Total Protein: 7.5 g/dL (ref 6.0–8.3)
Total Protein: 8.3 g/dL (ref 6.0–8.3)

## 2011-02-13 LAB — BASIC METABOLIC PANEL
BUN: 7 mg/dL (ref 6–23)
CO2: 26 mEq/L (ref 19–32)
Calcium: 8.9 mg/dL (ref 8.4–10.5)
Chloride: 105 mEq/L (ref 96–112)
Creatinine, Ser: 0.97 mg/dL (ref 0.4–1.5)
GFR calc Af Amer: >60 mL/min (ref >60)
GFR calc non Af Amer: >60 mL/min (ref >60)
Glucose, Bld: 95 mg/dL (ref 70–99)
Potassium: 4.2 mEq/L (ref 3.5–5.1)
Sodium: 137 mEq/L (ref 135–145)

## 2011-02-13 LAB — DIFFERENTIAL
Basophils Absolute: 0 10*3/uL (ref 0.0–0.1)
Basophils Absolute: 0 10*3/uL (ref 0.0–0.1)
Basophils Absolute: 0 10*3/uL (ref 0.0–0.1)
Basophils Absolute: 0 10*3/uL (ref 0.0–0.1)
Basophils Relative: 0 % (ref 0–1)
Basophils Relative: 0 % (ref 0–1)
Basophils Relative: 0 % (ref 0–1)
Basophils Relative: 0 % (ref 0–1)
Eosinophils Absolute: 0 10*3/uL (ref 0.0–0.7)
Eosinophils Absolute: 0 10*3/uL (ref 0.0–0.7)
Eosinophils Absolute: 0 10*3/uL (ref 0.0–0.7)
Eosinophils Absolute: 0.1 10*3/uL (ref 0.0–0.7)
Eosinophils Relative: 1 % (ref 0–5)
Eosinophils Relative: 0 % (ref 0–5)
Eosinophils Relative: 0 % (ref 0–5)
Eosinophils Relative: 1 % (ref 0–5)
Lymphocytes Relative: 37 % (ref 12–46)
Lymphocytes Relative: 31 % (ref 12–46)
Lymphocytes Relative: 38 % (ref 12–46)
Lymphocytes Relative: 49 % — ABNORMAL HIGH (ref 12–46)
Lymphs Abs: 2.7 10*3/uL (ref 0.7–4.0)
Lymphs Abs: 2.5 10*3/uL (ref 0.7–4.0)
Lymphs Abs: 3 10*3/uL (ref 0.7–4.0)
Lymphs Abs: 3.5 10*3/uL (ref 0.7–4.0)
Monocytes Absolute: 0.5 10*3/uL (ref 0.1–1.0)
Monocytes Absolute: 0.5 10*3/uL (ref 0.1–1.0)
Monocytes Absolute: 0.7 10*3/uL (ref 0.1–1.0)
Monocytes Absolute: 0.7 10*3/uL (ref 0.1–1.0)
Monocytes Relative: 7 % (ref 3–12)
Monocytes Relative: 7 % (ref 3–12)
Monocytes Relative: 8 % (ref 3–12)
Monocytes Relative: 9 % (ref 3–12)
Neutro Abs: 4.1 10*3/uL (ref 1.7–7.7)
Neutro Abs: 3 10*3/uL (ref 1.7–7.7)
Neutro Abs: 4.1 10*3/uL (ref 1.7–7.7)
Neutro Abs: 4.8 10*3/uL (ref 1.7–7.7)
Neutrophils Relative %: 56 % (ref 43–77)
Neutrophils Relative %: 43 % (ref 43–77)
Neutrophils Relative %: 53 % (ref 43–77)
Neutrophils Relative %: 60 % (ref 43–77)

## 2011-02-13 LAB — MRSA PCR SCREENING: MRSA by PCR: NEGATIVE

## 2011-02-13 LAB — RAPID URINE DRUG SCREEN, HOSP PERFORMED
Amphetamines: NOT DETECTED
Barbiturates: NOT DETECTED
Benzodiazepines: NOT DETECTED
Cocaine: NOT DETECTED
Opiates: POSITIVE — AB
Tetrahydrocannabinol: POSITIVE — AB

## 2011-02-13 LAB — URINE CULTURE
Colony Count: NO GROWTH
Colony Count: NO GROWTH
Culture  Setup Time: 201112290829
Culture  Setup Time: 201112301645
Culture: NO GROWTH
Culture: NO GROWTH

## 2011-02-13 LAB — HIV ANTIBODY (ROUTINE TESTING W REFLEX): HIV: NONREACTIVE

## 2011-02-13 LAB — TSH: TSH: 1.045 u[IU]/mL (ref 0.350–4.500)

## 2011-02-13 LAB — LIPASE, BLOOD
Lipase: 28 U/L (ref 11–59)
Lipase: 26 U/L (ref 11–59)
Lipase: 27 U/L (ref 11–59)

## 2011-02-13 LAB — GLUCOSE, CAPILLARY: Glucose-Capillary: 112 mg/dL — ABNORMAL HIGH (ref 70–99)

## 2011-02-13 LAB — HEPATITIS PANEL, ACUTE
HCV Ab: NEGATIVE
Hep A IgM: NEGATIVE
Hep B C IgM: NEGATIVE
Hepatitis B Surface Ag: NEGATIVE

## 2011-02-14 LAB — LIPASE, BLOOD
Lipase: 28 U/L (ref 11–59)
Lipase: 23 U/L (ref 11–59)
Lipase: 35 U/L (ref 11–59)

## 2011-02-14 LAB — COMPREHENSIVE METABOLIC PANEL
ALT: 24 U/L (ref 0–53)
ALT: 21 U/L (ref 0–53)
ALT: 32 U/L (ref 0–53)
ALT: 40 U/L (ref 0–53)
AST: 41 U/L — ABNORMAL HIGH (ref 0–37)
AST: 25 U/L (ref 0–37)
AST: 38 U/L — ABNORMAL HIGH (ref 0–37)
AST: 57 U/L — ABNORMAL HIGH (ref 0–37)
Albumin: 3.5 g/dL (ref 3.5–5.2)
Albumin: 3 g/dL — ABNORMAL LOW (ref 3.5–5.2)
Albumin: 3.6 g/dL (ref 3.5–5.2)
Albumin: 3.9 g/dL (ref 3.5–5.2)
Alkaline Phosphatase: 47 U/L (ref 39–117)
Alkaline Phosphatase: 40 U/L (ref 39–117)
Alkaline Phosphatase: 47 U/L (ref 39–117)
Alkaline Phosphatase: 52 U/L (ref 39–117)
BUN: 6 mg/dL (ref 6–23)
BUN: 3 mg/dL — ABNORMAL LOW (ref 6–23)
BUN: 5 mg/dL — ABNORMAL LOW (ref 6–23)
BUN: 5 mg/dL — ABNORMAL LOW (ref 6–23)
CO2: 25 mEq/L (ref 19–32)
CO2: 24 mEq/L (ref 19–32)
CO2: 24 mEq/L (ref 19–32)
CO2: 25 mEq/L (ref 19–32)
Calcium: 8.8 mg/dL (ref 8.4–10.5)
Calcium: 8.2 mg/dL — ABNORMAL LOW (ref 8.4–10.5)
Calcium: 9 mg/dL (ref 8.4–10.5)
Calcium: 9.1 mg/dL (ref 8.4–10.5)
Chloride: 107 mEq/L (ref 96–112)
Chloride: 101 mEq/L (ref 96–112)
Chloride: 105 mEq/L (ref 96–112)
Chloride: 107 mEq/L (ref 96–112)
Creatinine, Ser: 1.11 mg/dL (ref 0.4–1.5)
Creatinine, Ser: 0.84 mg/dL (ref 0.4–1.5)
Creatinine, Ser: 0.89 mg/dL (ref 0.4–1.5)
Creatinine, Ser: 0.96 mg/dL (ref 0.4–1.5)
GFR calc Af Amer: >60 mL/min (ref >60)
GFR calc Af Amer: >60 mL/min (ref >60)
GFR calc Af Amer: >60 mL/min (ref >60)
GFR calc Af Amer: >60 mL/min (ref >60)
GFR calc non Af Amer: >60 mL/min (ref >60)
GFR calc non Af Amer: >60 mL/min (ref >60)
GFR calc non Af Amer: >60 mL/min (ref >60)
GFR calc non Af Amer: >60 mL/min (ref >60)
Glucose, Bld: 138 mg/dL — ABNORMAL HIGH (ref 70–99)
Glucose, Bld: 116 mg/dL — ABNORMAL HIGH (ref 70–99)
Glucose, Bld: 118 mg/dL — ABNORMAL HIGH (ref 70–99)
Glucose, Bld: 127 mg/dL — ABNORMAL HIGH (ref 70–99)
Potassium: 3.2 mEq/L — ABNORMAL LOW (ref 3.5–5.1)
Potassium: 3.3 mEq/L — ABNORMAL LOW (ref 3.5–5.1)
Potassium: 4.7 mEq/L (ref 3.5–5.1)
Potassium: 4.8 mEq/L (ref 3.5–5.1)
Sodium: 139 mEq/L (ref 135–145)
Sodium: 134 mEq/L — ABNORMAL LOW (ref 135–145)
Sodium: 136 mEq/L (ref 135–145)
Sodium: 138 mEq/L (ref 135–145)
Total Bilirubin: 1.2 mg/dL (ref 0.3–1.2)
Total Bilirubin: 0.8 mg/dL (ref 0.3–1.2)
Total Bilirubin: 1.1 mg/dL (ref 0.3–1.2)
Total Bilirubin: 1.7 mg/dL — ABNORMAL HIGH (ref 0.3–1.2)
Total Protein: 6.6 g/dL (ref 6.0–8.3)
Total Protein: 5.8 g/dL — ABNORMAL LOW (ref 6.0–8.3)
Total Protein: 6.8 g/dL (ref 6.0–8.3)
Total Protein: 7 g/dL (ref 6.0–8.3)

## 2011-02-14 LAB — POCT I-STAT, CHEM 8
BUN: <3 mg/dL — ABNORMAL LOW (ref 6–23)
Calcium, Ion: 1.03 mmol/L — ABNORMAL LOW (ref 1.12–1.32)
Chloride: 103 mEq/L (ref 96–112)
Creatinine, Ser: 1.1 mg/dL (ref 0.4–1.5)
Glucose, Bld: 104 mg/dL — ABNORMAL HIGH (ref 70–99)
HCT: 44 % (ref 39.0–52.0)
Hemoglobin: 15 g/dL (ref 13.0–17.0)
Potassium: 4.8 mEq/L (ref 3.5–5.1)
Sodium: 135 mEq/L (ref 135–145)
TCO2: 27 mmol/L (ref 0–100)

## 2011-02-14 LAB — DIFFERENTIAL
Basophils Absolute: 0 10*3/uL (ref 0.0–0.1)
Basophils Relative: 0 % (ref 0–1)
Eosinophils Absolute: 0 10*3/uL (ref 0.0–0.7)
Eosinophils Relative: 0 % (ref 0–5)
Lymphocytes Relative: 19 % (ref 12–46)
Lymphs Abs: 1.8 10*3/uL (ref 0.7–4.0)
Monocytes Absolute: 0.7 10*3/uL (ref 0.1–1.0)
Monocytes Relative: 8 % (ref 3–12)
Neutro Abs: 6.8 10*3/uL (ref 1.7–7.7)
Neutrophils Relative %: 73 % (ref 43–77)

## 2011-02-14 LAB — CBC
HCT: 34 % — ABNORMAL LOW (ref 39.0–52.0)
HCT: 38.5 % — ABNORMAL LOW (ref 39.0–52.0)
HCT: 41 % (ref 39.0–52.0)
Hemoglobin: 11.5 g/dL — ABNORMAL LOW (ref 13.0–17.0)
Hemoglobin: 13.1 g/dL (ref 13.0–17.0)
Hemoglobin: 13.8 g/dL (ref 13.0–17.0)
MCH: 30.2 pg (ref 26.0–34.0)
MCH: 30.5 pg (ref 26.0–34.0)
MCH: 30.7 pg (ref 26.0–34.0)
MCHC: 33.7 g/dL (ref 30.0–36.0)
MCHC: 33.8 g/dL (ref 30.0–36.0)
MCHC: 34 g/dL (ref 30.0–36.0)
MCV: 89.5 fL (ref 78.0–100.0)
MCV: 89.7 fL (ref 78.0–100.0)
MCV: 90.7 fL (ref 78.0–100.0)
Platelets: 168 10*3/uL (ref 150–400)
Platelets: 215 10*3/uL (ref 150–400)
Platelets: 286 10*3/uL (ref 150–400)
RBC: 3.75 MIL/uL — ABNORMAL LOW (ref 4.22–5.81)
RBC: 4.3 MIL/uL (ref 4.22–5.81)
RBC: 4.57 MIL/uL (ref 4.22–5.81)
RDW: 13.1 % (ref 11.5–15.5)
RDW: 13.2 % (ref 11.5–15.5)
RDW: 13.3 % (ref 11.5–15.5)
WBC: 10.8 10*3/uL — ABNORMAL HIGH (ref 4.0–10.5)
WBC: 9.3 10*3/uL (ref 4.0–10.5)
WBC: 9.3 10*3/uL (ref 4.0–10.5)

## 2011-02-15 LAB — CBC
HCT: 40.4 % (ref 39.0–52.0)
HCT: 48.1 % (ref 39.0–52.0)
Hemoglobin: 13.5 g/dL (ref 13.0–17.0)
Hemoglobin: 16.1 g/dL (ref 13.0–17.0)
MCH: 30.2 pg (ref 26.0–34.0)
MCH: 32.2 pg (ref 26.0–34.0)
MCHC: 33.4 g/dL (ref 30.0–36.0)
MCHC: 33.5 g/dL (ref 30.0–36.0)
MCV: 90.4 fL (ref 78.0–100.0)
MCV: 96 fL (ref 78.0–100.0)
Platelets: 164 10*3/uL (ref 150–400)
Platelets: 202 10*3/uL (ref 150–400)
RBC: 4.47 MIL/uL (ref 4.22–5.81)
RBC: 5.01 MIL/uL (ref 4.22–5.81)
RDW: 13.3 % (ref 11.5–15.5)
RDW: 14.5 % (ref 11.5–15.5)
WBC: 11.3 10*3/uL — ABNORMAL HIGH (ref 4.0–10.5)
WBC: 8.3 10*3/uL (ref 4.0–10.5)

## 2011-02-15 LAB — COMPREHENSIVE METABOLIC PANEL
ALT: 19 U/L (ref 0–53)
ALT: 26 U/L (ref 0–53)
AST: 29 U/L (ref 0–37)
AST: 46 U/L — ABNORMAL HIGH (ref 0–37)
Albumin: 4 g/dL (ref 3.5–5.2)
Albumin: 4.1 g/dL (ref 3.5–5.2)
Alkaline Phosphatase: 53 U/L (ref 39–117)
Alkaline Phosphatase: 54 U/L (ref 39–117)
BUN: 8 mg/dL (ref 6–23)
BUN: 9 mg/dL (ref 6–23)
CO2: 23 mEq/L (ref 19–32)
CO2: 25 mEq/L (ref 19–32)
Calcium: 9.1 mg/dL (ref 8.4–10.5)
Calcium: 9.3 mg/dL (ref 8.4–10.5)
Chloride: 103 mEq/L (ref 96–112)
Chloride: 109 mEq/L (ref 96–112)
Creatinine, Ser: 0.95 mg/dL (ref 0.4–1.5)
Creatinine, Ser: 1.11 mg/dL (ref 0.4–1.5)
GFR calc Af Amer: >60 mL/min (ref >60)
GFR calc Af Amer: >60 mL/min (ref >60)
GFR calc non Af Amer: >60 mL/min (ref >60)
GFR calc non Af Amer: >60 mL/min (ref >60)
Glucose, Bld: 94 mg/dL (ref 70–99)
Glucose, Bld: 97 mg/dL (ref 70–99)
Potassium: 3.4 mEq/L — ABNORMAL LOW (ref 3.5–5.1)
Potassium: 3.8 mEq/L (ref 3.5–5.1)
Sodium: 138 mEq/L (ref 135–145)
Sodium: 140 mEq/L (ref 135–145)
Total Bilirubin: 0.9 mg/dL (ref 0.3–1.2)
Total Bilirubin: 1.1 mg/dL (ref 0.3–1.2)
Total Protein: 7.2 g/dL (ref 6.0–8.3)
Total Protein: 7.7 g/dL (ref 6.0–8.3)

## 2011-02-15 LAB — DIFFERENTIAL
Basophils Absolute: 0 10*3/uL (ref 0.0–0.1)
Basophils Absolute: 0 10*3/uL (ref 0.0–0.1)
Basophils Relative: 0 % (ref 0–1)
Basophils Relative: 0 % (ref 0–1)
Eosinophils Absolute: 0 10*3/uL (ref 0.0–0.7)
Eosinophils Absolute: 0 10*3/uL (ref 0.0–0.7)
Eosinophils Relative: 0 % (ref 0–5)
Eosinophils Relative: 0 % (ref 0–5)
Lymphocytes Relative: 15 % (ref 12–46)
Lymphocytes Relative: 19 % (ref 12–46)
Lymphs Abs: 1.6 10*3/uL (ref 0.7–4.0)
Lymphs Abs: 1.7 10*3/uL (ref 0.7–4.0)
Monocytes Absolute: 0.4 10*3/uL (ref 0.1–1.0)
Monocytes Absolute: 0.6 10*3/uL (ref 0.1–1.0)
Monocytes Relative: 4 % (ref 3–12)
Monocytes Relative: 7 % (ref 3–12)
Neutro Abs: 6.2 10*3/uL (ref 1.7–7.7)
Neutro Abs: 9.2 10*3/uL — ABNORMAL HIGH (ref 1.7–7.7)
Neutrophils Relative %: 74 % (ref 43–77)
Neutrophils Relative %: 81 % — ABNORMAL HIGH (ref 43–77)

## 2011-02-15 LAB — RAPID URINE DRUG SCREEN, HOSP PERFORMED
Amphetamines: NOT DETECTED
Barbiturates: NOT DETECTED
Benzodiazepines: NOT DETECTED
Cocaine: NOT DETECTED
Opiates: POSITIVE — AB
Tetrahydrocannabinol: POSITIVE — AB

## 2011-02-15 LAB — POCT I-STAT, CHEM 8
BUN: 7 mg/dL (ref 6–23)
Calcium, Ion: 0.87 mmol/L — ABNORMAL LOW (ref 1.12–1.32)
Chloride: 114 mEq/L — ABNORMAL HIGH (ref 96–112)
Creatinine, Ser: 0.9 mg/dL (ref 0.4–1.5)
Glucose, Bld: 88 mg/dL (ref 70–99)
HCT: 51 % (ref 39.0–52.0)
Hemoglobin: 17.3 g/dL — ABNORMAL HIGH (ref 13.0–17.0)
Potassium: 3.9 mEq/L (ref 3.5–5.1)
Sodium: 139 mEq/L (ref 135–145)
TCO2: 16 mmol/L (ref 0–100)

## 2011-02-15 LAB — URINALYSIS, ROUTINE W REFLEX MICROSCOPIC
Bilirubin Urine: NEGATIVE
Glucose, UA: NEGATIVE mg/dL
Hgb urine dipstick: NEGATIVE
Nitrite: NEGATIVE
Protein, ur: NEGATIVE mg/dL
Specific Gravity, Urine: 1.026 (ref 1.005–1.030)
Urobilinogen, UA: 0.2 mg/dL (ref 0.0–1.0)
pH: 6.5 (ref 5.0–8.0)

## 2011-02-15 LAB — ETHANOL: Alcohol, Ethyl (B): 6 mg/dL (ref 0–10)

## 2011-02-15 LAB — LIPASE, BLOOD
Lipase: 18 U/L (ref 11–59)
Lipase: 22 U/L (ref 11–59)

## 2011-02-20 LAB — COMPREHENSIVE METABOLIC PANEL
ALT: 35 U/L (ref 0–53)
ALT: 39 U/L (ref 0–53)
ALT: 50 U/L (ref 0–53)
AST: 117 U/L — ABNORMAL HIGH (ref 0–37)
AST: 76 U/L — ABNORMAL HIGH (ref 0–37)
AST: 86 U/L — ABNORMAL HIGH (ref 0–37)
Albumin: 3.6 g/dL (ref 3.5–5.2)
Albumin: 3.8 g/dL (ref 3.5–5.2)
Albumin: 4.2 g/dL (ref 3.5–5.2)
Alkaline Phosphatase: 53 U/L (ref 39–117)
Alkaline Phosphatase: 53 U/L (ref 39–117)
Alkaline Phosphatase: 60 U/L (ref 39–117)
BUN: 5 mg/dL — ABNORMAL LOW (ref 6–23)
BUN: 7 mg/dL (ref 6–23)
BUN: 8 mg/dL (ref 6–23)
CO2: 23 mEq/L (ref 19–32)
CO2: 23 mEq/L (ref 19–32)
CO2: 25 mEq/L (ref 19–32)
Calcium: 8.8 mg/dL (ref 8.4–10.5)
Calcium: 9.2 mg/dL (ref 8.4–10.5)
Calcium: 9.7 mg/dL (ref 8.4–10.5)
Chloride: 109 mEq/L (ref 96–112)
Chloride: 109 mEq/L (ref 96–112)
Chloride: 112 mEq/L (ref 96–112)
Creatinine, Ser: 0.94 mg/dL (ref 0.4–1.5)
Creatinine, Ser: 1.01 mg/dL (ref 0.4–1.5)
Creatinine, Ser: 1.03 mg/dL (ref 0.4–1.5)
GFR calc Af Amer: >60 mL/min (ref >60)
GFR calc Af Amer: >60 mL/min (ref >60)
GFR calc Af Amer: >60 mL/min (ref >60)
GFR calc non Af Amer: >60 mL/min (ref >60)
GFR calc non Af Amer: >60 mL/min (ref >60)
GFR calc non Af Amer: >60 mL/min (ref >60)
Glucose, Bld: 105 mg/dL — ABNORMAL HIGH (ref 70–99)
Glucose, Bld: 109 mg/dL — ABNORMAL HIGH (ref 70–99)
Glucose, Bld: 119 mg/dL — ABNORMAL HIGH (ref 70–99)
Potassium: 3.8 mEq/L (ref 3.5–5.1)
Potassium: 3.8 mEq/L (ref 3.5–5.1)
Potassium: 4.1 mEq/L (ref 3.5–5.1)
Sodium: 140 mEq/L (ref 135–145)
Sodium: 140 mEq/L (ref 135–145)
Sodium: 141 mEq/L (ref 135–145)
Total Bilirubin: 0.4 mg/dL (ref 0.3–1.2)
Total Bilirubin: 0.9 mg/dL (ref 0.3–1.2)
Total Bilirubin: 1.1 mg/dL (ref 0.3–1.2)
Total Protein: 6.7 g/dL (ref 6.0–8.3)
Total Protein: 6.9 g/dL (ref 6.0–8.3)
Total Protein: 7.8 g/dL (ref 6.0–8.3)

## 2011-02-20 LAB — DIFFERENTIAL
Basophils Absolute: 0 10*3/uL (ref 0.0–0.1)
Basophils Absolute: 0 10*3/uL (ref 0.0–0.1)
Basophils Absolute: 0 10*3/uL (ref 0.0–0.1)
Basophils Relative: 0 % (ref 0–1)
Basophils Relative: 0 % (ref 0–1)
Basophils Relative: 1 % (ref 0–1)
Eosinophils Absolute: 0 10*3/uL (ref 0.0–0.7)
Eosinophils Absolute: 0 10*3/uL (ref 0.0–0.7)
Eosinophils Absolute: 0 10*3/uL (ref 0.0–0.7)
Eosinophils Relative: 0 % (ref 0–5)
Eosinophils Relative: 0 % (ref 0–5)
Eosinophils Relative: 0 % (ref 0–5)
Lymphocytes Relative: 16 % (ref 12–46)
Lymphocytes Relative: 20 % (ref 12–46)
Lymphocytes Relative: 7 % — ABNORMAL LOW (ref 12–46)
Lymphs Abs: 0.7 10*3/uL (ref 0.7–4.0)
Lymphs Abs: 1.2 10*3/uL (ref 0.7–4.0)
Lymphs Abs: 1.5 10*3/uL (ref 0.7–4.0)
Monocytes Absolute: 0.5 10*3/uL (ref 0.1–1.0)
Monocytes Absolute: 0.6 10*3/uL (ref 0.1–1.0)
Monocytes Absolute: 0.6 10*3/uL (ref 0.1–1.0)
Monocytes Relative: 6 % (ref 3–12)
Monocytes Relative: 7 % (ref 3–12)
Monocytes Relative: 8 % (ref 3–12)
Neutro Abs: 5.4 10*3/uL (ref 1.7–7.7)
Neutro Abs: 5.5 10*3/uL (ref 1.7–7.7)
Neutro Abs: 9.2 10*3/uL — ABNORMAL HIGH (ref 1.7–7.7)
Neutrophils Relative %: 71 % (ref 43–77)
Neutrophils Relative %: 77 % (ref 43–77)
Neutrophils Relative %: 88 % — ABNORMAL HIGH (ref 43–77)

## 2011-02-20 LAB — LIPASE, BLOOD
Lipase: 20 U/L (ref 11–59)
Lipase: 20 U/L (ref 11–59)
Lipase: 28 U/L (ref 11–59)

## 2011-02-20 LAB — URINE CULTURE
Colony Count: NO GROWTH
Culture: NO GROWTH

## 2011-02-20 LAB — URINALYSIS, ROUTINE W REFLEX MICROSCOPIC
Glucose, UA: NEGATIVE mg/dL
Hgb urine dipstick: NEGATIVE
Ketones, ur: 40 mg/dL — AB
Nitrite: NEGATIVE
Protein, ur: NEGATIVE mg/dL
Specific Gravity, Urine: 1.028 (ref 1.005–1.030)
Urobilinogen, UA: 0.2 mg/dL (ref 0.0–1.0)
pH: 5.5 (ref 5.0–8.0)

## 2011-02-20 LAB — RAPID URINE DRUG SCREEN, HOSP PERFORMED
Amphetamines: NOT DETECTED
Barbiturates: NOT DETECTED
Benzodiazepines: NOT DETECTED
Cocaine: NOT DETECTED
Opiates: NOT DETECTED
Tetrahydrocannabinol: POSITIVE — AB

## 2011-02-20 LAB — CBC
HCT: 39.8 % (ref 39.0–52.0)
HCT: 40.6 % (ref 39.0–52.0)
HCT: 43.2 % (ref 39.0–52.0)
Hemoglobin: 13.4 g/dL (ref 13.0–17.0)
Hemoglobin: 13.6 g/dL (ref 13.0–17.0)
Hemoglobin: 14.4 g/dL (ref 13.0–17.0)
MCHC: 33.4 g/dL (ref 30.0–36.0)
MCHC: 33.5 g/dL (ref 30.0–36.0)
MCHC: 33.6 g/dL (ref 30.0–36.0)
MCV: 95.3 fL (ref 78.0–100.0)
MCV: 95.4 fL (ref 78.0–100.0)
MCV: 95.8 fL (ref 78.0–100.0)
Platelets: 175 10*3/uL (ref 150–400)
Platelets: 181 10*3/uL (ref 150–400)
Platelets: 196 10*3/uL (ref 150–400)
RBC: 4.18 MIL/uL — ABNORMAL LOW (ref 4.22–5.81)
RBC: 4.25 MIL/uL (ref 4.22–5.81)
RBC: 4.52 MIL/uL (ref 4.22–5.81)
RDW: 13.4 % (ref 11.5–15.5)
RDW: 13.7 % (ref 11.5–15.5)
RDW: 13.8 % (ref 11.5–15.5)
WBC: 10.5 10*3/uL (ref 4.0–10.5)
WBC: 7.2 10*3/uL (ref 4.0–10.5)
WBC: 7.6 10*3/uL (ref 4.0–10.5)

## 2011-02-20 LAB — POCT URINALYSIS DIP (DEVICE)
Glucose, UA: NEGATIVE mg/dL
Ketones, ur: 15 mg/dL — AB
Nitrite: POSITIVE — AB
Protein, ur: 100 mg/dL — AB
Specific Gravity, Urine: >=1.03 (ref 1.005–1.030)
Urobilinogen, UA: 1 mg/dL (ref 0.0–1.0)
pH: 5.5 (ref 5.0–8.0)

## 2011-03-17 ENCOUNTER — Emergency Department (HOSPITAL_COMMUNITY): Payer: Self-pay

## 2011-03-17 ENCOUNTER — Emergency Department (HOSPITAL_COMMUNITY)
Admission: EM | Admit: 2011-03-17 | Discharge: 2011-03-17 | Disposition: A | Discharge status: Home / Self Care | Payer: Self-pay | Attending: Emergency Medicine | Admitting: Emergency Medicine

## 2011-03-17 DIAGNOSIS — G8929 Other chronic pain: Secondary | ICD-10-CM | POA: Insufficient documentation

## 2011-03-17 DIAGNOSIS — R1011 Right upper quadrant pain: Secondary | ICD-10-CM | POA: Insufficient documentation

## 2011-03-17 DIAGNOSIS — R112 Nausea with vomiting, unspecified: Secondary | ICD-10-CM | POA: Insufficient documentation

## 2011-03-17 LAB — COMPREHENSIVE METABOLIC PANEL
ALT: 35 U/L (ref 0–53)
AST: 39 U/L — ABNORMAL HIGH (ref 0–37)
Albumin: 4.1 g/dL (ref 3.5–5.2)
Alkaline Phosphatase: 61 U/L (ref 39–117)
BUN: 7 mg/dL (ref 6–23)
CO2: 21 mEq/L (ref 19–32)
Calcium: 9.5 mg/dL (ref 8.4–10.5)
Chloride: 106 mEq/L (ref 96–112)
Creatinine, Ser: 1.05 mg/dL (ref 0.4–1.5)
GFR calc Af Amer: >60 mL/min (ref >60)
GFR calc non Af Amer: >60 mL/min (ref >60)
Glucose, Bld: 118 mg/dL — ABNORMAL HIGH (ref 70–99)
Potassium: 4.1 mEq/L (ref 3.5–5.1)
Sodium: 140 mEq/L (ref 135–145)
Total Bilirubin: 0.6 mg/dL (ref 0.3–1.2)
Total Protein: 8 g/dL (ref 6.0–8.3)

## 2011-03-17 LAB — DIFFERENTIAL
Basophils Absolute: 0 10*3/uL (ref 0.0–0.1)
Basophils Relative: 1 % (ref 0–1)
Eosinophils Absolute: 0 10*3/uL (ref 0.0–0.7)
Eosinophils Relative: 0 % (ref 0–5)
Lymphocytes Relative: 29 % (ref 12–46)
Lymphs Abs: 1.8 10*3/uL (ref 0.7–4.0)
Monocytes Absolute: 0.6 10*3/uL (ref 0.1–1.0)
Monocytes Relative: 10 % (ref 3–12)
Neutro Abs: 3.8 10*3/uL (ref 1.7–7.7)
Neutrophils Relative %: 60 % (ref 43–77)

## 2011-03-17 LAB — CBC
HCT: 43.6 % (ref 39.0–52.0)
Hemoglobin: 15.2 g/dL (ref 13.0–17.0)
MCH: 31.3 pg (ref 26.0–34.0)
MCHC: 34.9 g/dL (ref 30.0–36.0)
MCV: 89.9 fL (ref 78.0–100.0)
Platelets: 212 10*3/uL (ref 150–400)
RBC: 4.85 MIL/uL (ref 4.22–5.81)
RDW: 13.7 % (ref 11.5–15.5)
WBC: 6.3 10*3/uL (ref 4.0–10.5)

## 2011-03-17 LAB — LIPASE, BLOOD: Lipase: 22 U/L (ref 11–59)

## 2011-03-20 ENCOUNTER — Emergency Department (HOSPITAL_COMMUNITY)
Admission: EM | Admit: 2011-03-20 | Discharge: 2011-03-20 | Disposition: A | Discharge status: Home / Self Care | Payer: Self-pay | Attending: Emergency Medicine | Admitting: Emergency Medicine

## 2011-03-20 ENCOUNTER — Emergency Department (HOSPITAL_COMMUNITY): Payer: Self-pay

## 2011-03-20 DIAGNOSIS — R109 Unspecified abdominal pain: Secondary | ICD-10-CM | POA: Insufficient documentation

## 2011-03-20 DIAGNOSIS — R110 Nausea: Secondary | ICD-10-CM | POA: Insufficient documentation

## 2011-03-20 LAB — COMPREHENSIVE METABOLIC PANEL
ALT: 31 U/L (ref 0–53)
AST: 29 U/L (ref 0–37)
Albumin: 4 g/dL (ref 3.5–5.2)
Alkaline Phosphatase: 59 U/L (ref 39–117)
BUN: 5 mg/dL — ABNORMAL LOW (ref 6–23)
CO2: 26 mEq/L (ref 19–32)
Calcium: 9.4 mg/dL (ref 8.4–10.5)
Chloride: 107 mEq/L (ref 96–112)
Creatinine, Ser: 0.92 mg/dL (ref 0.4–1.5)
GFR calc Af Amer: >60 mL/min (ref >60)
GFR calc non Af Amer: >60 mL/min (ref >60)
Glucose, Bld: 106 mg/dL — ABNORMAL HIGH (ref 70–99)
Potassium: 3.9 mEq/L (ref 3.5–5.1)
Sodium: 138 mEq/L (ref 135–145)
Total Bilirubin: 0.8 mg/dL (ref 0.3–1.2)
Total Protein: 7.5 g/dL (ref 6.0–8.3)

## 2011-03-20 LAB — DIFFERENTIAL
Basophils Absolute: 0 10*3/uL (ref 0.0–0.1)
Basophils Relative: 1 % (ref 0–1)
Eosinophils Absolute: 0 10*3/uL (ref 0.0–0.7)
Eosinophils Relative: 0 % (ref 0–5)
Lymphocytes Relative: 38 % (ref 12–46)
Lymphs Abs: 2.1 10*3/uL (ref 0.7–4.0)
Monocytes Absolute: 0.6 10*3/uL (ref 0.1–1.0)
Monocytes Relative: 11 % (ref 3–12)
Neutro Abs: 2.8 10*3/uL (ref 1.7–7.7)
Neutrophils Relative %: 51 % (ref 43–77)

## 2011-03-20 LAB — URINALYSIS, ROUTINE W REFLEX MICROSCOPIC
Glucose, UA: NEGATIVE mg/dL
Hgb urine dipstick: NEGATIVE
Ketones, ur: 15 mg/dL — AB
Nitrite: NEGATIVE
Protein, ur: 100 mg/dL — AB
Specific Gravity, Urine: 1.035 — ABNORMAL HIGH (ref 1.005–1.030)
Urobilinogen, UA: 1 mg/dL (ref 0.0–1.0)
pH: 5.5 (ref 5.0–8.0)

## 2011-03-20 LAB — CBC
HCT: 46.7 % (ref 39.0–52.0)
Hemoglobin: 16.2 g/dL (ref 13.0–17.0)
MCH: 31.3 pg (ref 26.0–34.0)
MCHC: 34.7 g/dL (ref 30.0–36.0)
MCV: 90.2 fL (ref 78.0–100.0)
Platelets: 227 10*3/uL (ref 150–400)
RBC: 5.18 MIL/uL (ref 4.22–5.81)
RDW: 13.6 % (ref 11.5–15.5)
WBC: 5.5 10*3/uL (ref 4.0–10.5)

## 2011-03-20 LAB — URINE MICROSCOPIC-ADD ON

## 2011-03-30 ENCOUNTER — Emergency Department (HOSPITAL_COMMUNITY)
Admission: EM | Admit: 2011-03-30 | Discharge: 2011-03-31 | Disposition: A | Discharge status: Home / Self Care | Payer: Self-pay | Attending: Emergency Medicine | Admitting: Emergency Medicine

## 2011-03-30 DIAGNOSIS — IMO0002 Reserved for concepts with insufficient information to code with codable children: Secondary | ICD-10-CM | POA: Insufficient documentation

## 2011-03-30 DIAGNOSIS — R109 Unspecified abdominal pain: Secondary | ICD-10-CM | POA: Insufficient documentation

## 2011-03-30 DIAGNOSIS — G8929 Other chronic pain: Secondary | ICD-10-CM | POA: Insufficient documentation

## 2011-03-30 DIAGNOSIS — R112 Nausea with vomiting, unspecified: Secondary | ICD-10-CM | POA: Insufficient documentation

## 2011-03-30 DIAGNOSIS — F411 Generalized anxiety disorder: Secondary | ICD-10-CM | POA: Insufficient documentation

## 2011-03-31 ENCOUNTER — Emergency Department (HOSPITAL_COMMUNITY): Payer: Self-pay

## 2011-03-31 LAB — COMPREHENSIVE METABOLIC PANEL
ALT: 24 U/L (ref 0–53)
AST: 28 U/L (ref 0–37)
Albumin: 4.2 g/dL (ref 3.5–5.2)
Alkaline Phosphatase: 64 U/L (ref 39–117)
BUN: 8 mg/dL (ref 6–23)
CO2: 25 mEq/L (ref 19–32)
Calcium: 9.6 mg/dL (ref 8.4–10.5)
Chloride: 105 mEq/L (ref 96–112)
Creatinine, Ser: 1.08 mg/dL (ref 0.4–1.5)
GFR calc Af Amer: >60 mL/min (ref >60)
GFR calc non Af Amer: >60 mL/min (ref >60)
Glucose, Bld: 139 mg/dL — ABNORMAL HIGH (ref 70–99)
Potassium: 3.8 mEq/L (ref 3.5–5.1)
Sodium: 140 mEq/L (ref 135–145)
Total Bilirubin: 0.8 mg/dL (ref 0.3–1.2)
Total Protein: 7.7 g/dL (ref 6.0–8.3)

## 2011-03-31 LAB — CBC
HCT: 39.7 % (ref 39.0–52.0)
Hemoglobin: 13.9 g/dL (ref 13.0–17.0)
MCH: 31.6 pg (ref 26.0–34.0)
MCHC: 35 g/dL (ref 30.0–36.0)
MCV: 90.2 fL (ref 78.0–100.0)
Platelets: 276 10*3/uL (ref 150–400)
RBC: 4.4 MIL/uL (ref 4.22–5.81)
RDW: 13.7 % (ref 11.5–15.5)
WBC: 12.7 10*3/uL — ABNORMAL HIGH (ref 4.0–10.5)

## 2011-03-31 LAB — URINALYSIS, ROUTINE W REFLEX MICROSCOPIC
Glucose, UA: NEGATIVE mg/dL
Hgb urine dipstick: NEGATIVE
Ketones, ur: 15 mg/dL — AB
Leukocytes, UA: NEGATIVE
Nitrite: NEGATIVE
Protein, ur: 30 mg/dL — AB
Specific Gravity, Urine: 1.029 (ref 1.005–1.030)
Urobilinogen, UA: 1 mg/dL (ref 0.0–1.0)
pH: 7 (ref 5.0–8.0)

## 2011-03-31 LAB — DIFFERENTIAL
Basophils Absolute: 0 10*3/uL (ref 0.0–0.1)
Basophils Relative: 0 % (ref 0–1)
Eosinophils Absolute: 0 10*3/uL (ref 0.0–0.7)
Eosinophils Relative: 0 % (ref 0–5)
Lymphocytes Relative: 15 % (ref 12–46)
Lymphs Abs: 1.9 10*3/uL (ref 0.7–4.0)
Monocytes Absolute: 0.8 10*3/uL (ref 0.1–1.0)
Monocytes Relative: 6 % (ref 3–12)
Neutro Abs: 10 10*3/uL — ABNORMAL HIGH (ref 1.7–7.7)
Neutrophils Relative %: 79 % — ABNORMAL HIGH (ref 43–77)

## 2011-03-31 LAB — URINE MICROSCOPIC-ADD ON

## 2011-03-31 LAB — LIPASE, BLOOD: Lipase: 25 U/L (ref 11–59)

## 2011-04-01 ENCOUNTER — Emergency Department (HOSPITAL_COMMUNITY)
Admission: EM | Admit: 2011-04-01 | Discharge: 2011-04-02 | Disposition: A | Discharge status: Home / Self Care | Payer: Self-pay | Attending: Emergency Medicine | Admitting: Emergency Medicine

## 2011-04-01 ENCOUNTER — Emergency Department (HOSPITAL_COMMUNITY): Payer: Self-pay

## 2011-04-01 DIAGNOSIS — G8929 Other chronic pain: Secondary | ICD-10-CM | POA: Insufficient documentation

## 2011-04-01 DIAGNOSIS — R109 Unspecified abdominal pain: Secondary | ICD-10-CM | POA: Insufficient documentation

## 2011-04-01 DIAGNOSIS — R112 Nausea with vomiting, unspecified: Secondary | ICD-10-CM | POA: Insufficient documentation

## 2011-04-01 LAB — DIFFERENTIAL
Basophils Absolute: 0 10*3/uL (ref 0.0–0.1)
Basophils Relative: 0 % (ref 0–1)
Eosinophils Absolute: 0 10*3/uL (ref 0.0–0.7)
Eosinophils Relative: 0 % (ref 0–5)
Lymphocytes Relative: 22 % (ref 12–46)
Lymphs Abs: 1.7 10*3/uL (ref 0.7–4.0)
Monocytes Absolute: 0.6 10*3/uL (ref 0.1–1.0)
Monocytes Relative: 7 % (ref 3–12)
Neutro Abs: 5.6 10*3/uL (ref 1.7–7.7)
Neutrophils Relative %: 71 % (ref 43–77)

## 2011-04-01 LAB — COMPREHENSIVE METABOLIC PANEL
ALT: 26 U/L (ref 0–53)
AST: 37 U/L (ref 0–37)
Albumin: 4.3 g/dL (ref 3.5–5.2)
Alkaline Phosphatase: 66 U/L (ref 39–117)
BUN: 10 mg/dL (ref 6–23)
CO2: 23 mEq/L (ref 19–32)
Calcium: 9.9 mg/dL (ref 8.4–10.5)
Chloride: 106 mEq/L (ref 96–112)
Creatinine, Ser: 1.12 mg/dL (ref 0.4–1.5)
GFR calc Af Amer: >60 mL/min (ref >60)
GFR calc non Af Amer: >60 mL/min (ref >60)
Glucose, Bld: 110 mg/dL — ABNORMAL HIGH (ref 70–99)
Potassium: 4.3 mEq/L (ref 3.5–5.1)
Sodium: 140 mEq/L (ref 135–145)
Total Bilirubin: 1.3 mg/dL — ABNORMAL HIGH (ref 0.3–1.2)
Total Protein: 7.7 g/dL (ref 6.0–8.3)

## 2011-04-01 LAB — CBC
HCT: 43 % (ref 39.0–52.0)
Hemoglobin: 15.2 g/dL (ref 13.0–17.0)
MCH: 31.3 pg (ref 26.0–34.0)
MCHC: 35.3 g/dL (ref 30.0–36.0)
MCV: 88.7 fL (ref 78.0–100.0)
Platelets: 247 10*3/uL (ref 150–400)
RBC: 4.85 MIL/uL (ref 4.22–5.81)
RDW: 13.5 % (ref 11.5–15.5)
WBC: 7.9 10*3/uL (ref 4.0–10.5)

## 2011-04-01 LAB — LIPASE, BLOOD: Lipase: 23 U/L (ref 11–59)

## 2011-04-03 ENCOUNTER — Emergency Department (HOSPITAL_COMMUNITY)
Admission: EM | Admit: 2011-04-03 | Discharge: 2011-04-03 | Disposition: A | Discharge status: Home / Self Care | Payer: Self-pay | Attending: Emergency Medicine | Admitting: Emergency Medicine

## 2011-04-03 DIAGNOSIS — Z9089 Acquired absence of other organs: Secondary | ICD-10-CM | POA: Insufficient documentation

## 2011-04-03 DIAGNOSIS — R109 Unspecified abdominal pain: Secondary | ICD-10-CM | POA: Insufficient documentation

## 2011-04-03 DIAGNOSIS — Z9889 Other specified postprocedural states: Secondary | ICD-10-CM | POA: Insufficient documentation

## 2011-04-03 DIAGNOSIS — G8929 Other chronic pain: Secondary | ICD-10-CM | POA: Insufficient documentation
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
## 2011-04-24 ENCOUNTER — Emergency Department (HOSPITAL_COMMUNITY)
Admission: EM | Admit: 2011-04-24 | Discharge: 2011-04-24 | Disposition: A | Discharge status: Home / Self Care | Payer: Self-pay | Attending: Emergency Medicine | Admitting: Emergency Medicine

## 2011-04-24 DIAGNOSIS — G8929 Other chronic pain: Secondary | ICD-10-CM | POA: Insufficient documentation

## 2011-04-24 DIAGNOSIS — R1011 Right upper quadrant pain: Secondary | ICD-10-CM | POA: Insufficient documentation

## 2011-04-30 ENCOUNTER — Emergency Department (HOSPITAL_COMMUNITY)
Admission: EM | Admit: 2011-04-30 | Discharge: 2011-04-30 | Disposition: A | Discharge status: Home / Self Care | Payer: Self-pay | Attending: Emergency Medicine | Admitting: Emergency Medicine

## 2011-04-30 ENCOUNTER — Emergency Department (HOSPITAL_COMMUNITY): Payer: Self-pay

## 2011-04-30 DIAGNOSIS — R112 Nausea with vomiting, unspecified: Secondary | ICD-10-CM | POA: Insufficient documentation

## 2011-04-30 DIAGNOSIS — R109 Unspecified abdominal pain: Secondary | ICD-10-CM | POA: Insufficient documentation

## 2011-04-30 DIAGNOSIS — G8929 Other chronic pain: Secondary | ICD-10-CM | POA: Insufficient documentation

## 2011-04-30 LAB — COMPREHENSIVE METABOLIC PANEL
ALT: 31 U/L (ref 0–53)
AST: 28 U/L (ref 0–37)
Albumin: 4.1 g/dL (ref 3.5–5.2)
Alkaline Phosphatase: 63 U/L (ref 39–117)
BUN: 5 mg/dL — ABNORMAL LOW (ref 6–23)
CO2: 20 mEq/L (ref 19–32)
Calcium: 9.4 mg/dL (ref 8.4–10.5)
Chloride: 106 mEq/L (ref 96–112)
Creatinine, Ser: 0.78 mg/dL (ref 0.4–1.5)
GFR calc Af Amer: >60 mL/min (ref >60)
GFR calc non Af Amer: >60 mL/min (ref >60)
Glucose, Bld: 101 mg/dL — ABNORMAL HIGH (ref 70–99)
Potassium: 3.9 mEq/L (ref 3.5–5.1)
Sodium: 139 mEq/L (ref 135–145)
Total Bilirubin: 0.5 mg/dL (ref 0.3–1.2)
Total Protein: 7.4 g/dL (ref 6.0–8.3)

## 2011-04-30 LAB — URINALYSIS, ROUTINE W REFLEX MICROSCOPIC
Bilirubin Urine: NEGATIVE
Glucose, UA: NEGATIVE mg/dL
Hgb urine dipstick: NEGATIVE
Ketones, ur: NEGATIVE mg/dL
Nitrite: NEGATIVE
Protein, ur: NEGATIVE mg/dL
Specific Gravity, Urine: 1.02 (ref 1.005–1.030)
Urobilinogen, UA: 1 mg/dL (ref 0.0–1.0)
pH: 7.5 (ref 5.0–8.0)

## 2011-04-30 LAB — CBC
HCT: 42.3 % (ref 39.0–52.0)
Hemoglobin: 15.1 g/dL (ref 13.0–17.0)
MCH: 31.9 pg (ref 26.0–34.0)
MCHC: 35.7 g/dL (ref 30.0–36.0)
MCV: 89.2 fL (ref 78.0–100.0)
Platelets: 206 10*3/uL (ref 150–400)
RBC: 4.74 MIL/uL (ref 4.22–5.81)
RDW: 13.2 % (ref 11.5–15.5)
WBC: 10.4 10*3/uL (ref 4.0–10.5)

## 2011-04-30 LAB — LIPASE, BLOOD: Lipase: 39 U/L (ref 11–59)

## 2011-04-30 LAB — DIFFERENTIAL
Basophils Absolute: 0 10*3/uL (ref 0.0–0.1)
Basophils Relative: 0 % (ref 0–1)
Eosinophils Absolute: 0 10*3/uL (ref 0.0–0.7)
Eosinophils Relative: 0 % (ref 0–5)
Lymphocytes Relative: 21 % (ref 12–46)
Lymphs Abs: 2.2 10*3/uL (ref 0.7–4.0)
Monocytes Absolute: 0.7 10*3/uL (ref 0.1–1.0)
Monocytes Relative: 7 % (ref 3–12)
Neutro Abs: 7.4 10*3/uL (ref 1.7–7.7)
Neutrophils Relative %: 72 % (ref 43–77)

## 2011-05-22 ENCOUNTER — Emergency Department (HOSPITAL_COMMUNITY): Payer: Self-pay

## 2011-05-22 ENCOUNTER — Emergency Department (HOSPITAL_COMMUNITY)
Admission: EM | Admit: 2011-05-22 | Discharge: 2011-05-22 | Disposition: A | Discharge status: Home / Self Care | Payer: Self-pay | Attending: Emergency Medicine | Admitting: Emergency Medicine

## 2011-05-22 DIAGNOSIS — K2990 Gastroduodenitis, unspecified, without bleeding: Secondary | ICD-10-CM | POA: Insufficient documentation

## 2011-05-22 DIAGNOSIS — G8929 Other chronic pain: Secondary | ICD-10-CM | POA: Insufficient documentation

## 2011-05-22 DIAGNOSIS — K2970 Gastritis, unspecified, without bleeding: Secondary | ICD-10-CM | POA: Insufficient documentation

## 2011-05-22 DIAGNOSIS — R1011 Right upper quadrant pain: Secondary | ICD-10-CM | POA: Insufficient documentation

## 2011-05-22 DIAGNOSIS — R112 Nausea with vomiting, unspecified: Secondary | ICD-10-CM | POA: Insufficient documentation

## 2011-05-22 LAB — DIFFERENTIAL
Basophils Absolute: 0 10*3/uL (ref 0.0–0.1)
Basophils Relative: 0 % (ref 0–1)
Eosinophils Absolute: 0 10*3/uL (ref 0.0–0.7)
Eosinophils Relative: 0 % (ref 0–5)
Lymphocytes Relative: 18 % (ref 12–46)
Lymphs Abs: 1.6 10*3/uL (ref 0.7–4.0)
Monocytes Absolute: 0.6 10*3/uL (ref 0.1–1.0)
Monocytes Relative: 7 % (ref 3–12)
Neutro Abs: 6.4 10*3/uL (ref 1.7–7.7)
Neutrophils Relative %: 75 % (ref 43–77)

## 2011-05-22 LAB — URINALYSIS, ROUTINE W REFLEX MICROSCOPIC
Glucose, UA: NEGATIVE mg/dL
Ketones, ur: 15 mg/dL — AB
Leukocytes, UA: NEGATIVE
Nitrite: NEGATIVE
Protein, ur: 30 mg/dL — AB
Specific Gravity, Urine: 1.034 — ABNORMAL HIGH (ref 1.005–1.030)
Urobilinogen, UA: 1 mg/dL (ref 0.0–1.0)
pH: 6 (ref 5.0–8.0)

## 2011-05-22 LAB — COMPREHENSIVE METABOLIC PANEL
ALT: 28 U/L (ref 0–53)
AST: 20 U/L (ref 0–37)
Albumin: 4.4 g/dL (ref 3.5–5.2)
Alkaline Phosphatase: 61 U/L (ref 39–117)
BUN: 12 mg/dL (ref 6–23)
CO2: 25 mEq/L (ref 19–32)
Calcium: 9.9 mg/dL (ref 8.4–10.5)
Chloride: 107 mEq/L (ref 96–112)
Creatinine, Ser: 0.91 mg/dL (ref 0.50–1.35)
GFR calc Af Amer: >60 mL/min (ref >60)
GFR calc non Af Amer: >60 mL/min (ref >60)
Glucose, Bld: 157 mg/dL — ABNORMAL HIGH (ref 70–99)
Potassium: 3.8 mEq/L (ref 3.5–5.1)
Sodium: 142 mEq/L (ref 135–145)
Total Bilirubin: 0.5 mg/dL (ref 0.3–1.2)
Total Protein: 7.7 g/dL (ref 6.0–8.3)

## 2011-05-22 LAB — CBC
HCT: 40.1 % (ref 39.0–52.0)
Hemoglobin: 14.5 g/dL (ref 13.0–17.0)
MCH: 31.9 pg (ref 26.0–34.0)
MCHC: 36.2 g/dL — ABNORMAL HIGH (ref 30.0–36.0)
MCV: 88.1 fL (ref 78.0–100.0)
Platelets: 211 10*3/uL (ref 150–400)
RBC: 4.55 MIL/uL (ref 4.22–5.81)
RDW: 13 % (ref 11.5–15.5)
WBC: 8.6 10*3/uL (ref 4.0–10.5)

## 2011-05-22 LAB — OCCULT BLOOD, POC DEVICE: Fecal Occult Bld: NEGATIVE

## 2011-05-22 LAB — URINE MICROSCOPIC-ADD ON

## 2011-05-22 LAB — LIPASE, BLOOD: Lipase: 20 U/L (ref 11–59)

## 2011-05-30 ENCOUNTER — Emergency Department (HOSPITAL_COMMUNITY): Payer: Self-pay

## 2011-05-30 ENCOUNTER — Emergency Department (HOSPITAL_COMMUNITY)
Admission: EM | Admit: 2011-05-30 | Discharge: 2011-05-30 | Disposition: A | Discharge status: Home / Self Care | Payer: Self-pay | Attending: Emergency Medicine | Admitting: Emergency Medicine

## 2011-05-30 DIAGNOSIS — R109 Unspecified abdominal pain: Secondary | ICD-10-CM | POA: Insufficient documentation

## 2011-05-30 DIAGNOSIS — Z9889 Other specified postprocedural states: Secondary | ICD-10-CM | POA: Insufficient documentation

## 2011-05-30 DIAGNOSIS — R112 Nausea with vomiting, unspecified: Secondary | ICD-10-CM | POA: Insufficient documentation

## 2011-05-30 DIAGNOSIS — G8929 Other chronic pain: Secondary | ICD-10-CM | POA: Insufficient documentation

## 2011-07-06 ENCOUNTER — Emergency Department (HOSPITAL_COMMUNITY)
Admission: EM | Admit: 2011-07-06 | Discharge: 2011-07-06 | Disposition: A | Discharge status: Home / Self Care | Payer: Self-pay | Attending: Emergency Medicine | Admitting: Emergency Medicine

## 2011-07-06 ENCOUNTER — Emergency Department (HOSPITAL_COMMUNITY): Payer: Self-pay

## 2011-07-06 DIAGNOSIS — R1011 Right upper quadrant pain: Secondary | ICD-10-CM | POA: Insufficient documentation

## 2011-07-06 DIAGNOSIS — G8929 Other chronic pain: Secondary | ICD-10-CM | POA: Insufficient documentation

## 2011-07-06 LAB — COMPREHENSIVE METABOLIC PANEL
ALT: 27 U/L (ref 0–53)
AST: 22 U/L (ref 0–37)
Albumin: 3.7 g/dL (ref 3.5–5.2)
Alkaline Phosphatase: 63 U/L (ref 39–117)
BUN: 9 mg/dL (ref 6–23)
CO2: 27 mEq/L (ref 19–32)
Calcium: 9.5 mg/dL (ref 8.4–10.5)
Chloride: 100 mEq/L (ref 96–112)
Creatinine, Ser: 0.87 mg/dL (ref 0.50–1.35)
GFR calc Af Amer: >60 mL/min (ref >60)
GFR calc non Af Amer: >60 mL/min (ref >60)
Glucose, Bld: 108 mg/dL — ABNORMAL HIGH (ref 70–99)
Potassium: 4.3 mEq/L (ref 3.5–5.1)
Sodium: 135 mEq/L (ref 135–145)
Total Bilirubin: 0.4 mg/dL (ref 0.3–1.2)
Total Protein: 7.5 g/dL (ref 6.0–8.3)

## 2011-07-06 LAB — CBC
HCT: 43.5 % (ref 39.0–52.0)
Hemoglobin: 15.4 g/dL (ref 13.0–17.0)
MCH: 31.7 pg (ref 26.0–34.0)
MCHC: 35.4 g/dL (ref 30.0–36.0)
MCV: 89.5 fL (ref 78.0–100.0)
Platelets: 124 10*3/uL — ABNORMAL LOW (ref 150–400)
RBC: 4.86 MIL/uL (ref 4.22–5.81)
RDW: 13.4 % (ref 11.5–15.5)
WBC: 8 10*3/uL (ref 4.0–10.5)

## 2011-07-06 LAB — DIFFERENTIAL
Basophils Absolute: 0 10*3/uL (ref 0.0–0.1)
Basophils Relative: 0 % (ref 0–1)
Eosinophils Absolute: 0.1 10*3/uL (ref 0.0–0.7)
Eosinophils Relative: 2 % (ref 0–5)
Lymphocytes Relative: 39 % (ref 12–46)
Lymphs Abs: 3.1 10*3/uL (ref 0.7–4.0)
Monocytes Absolute: 0.8 10*3/uL (ref 0.1–1.0)
Monocytes Relative: 9 % (ref 3–12)
Neutro Abs: 4 10*3/uL (ref 1.7–7.7)
Neutrophils Relative %: 50 % (ref 43–77)

## 2011-07-06 LAB — LIPASE, BLOOD: Lipase: 24 U/L (ref 11–59)

## 2011-07-06 LAB — URINALYSIS, ROUTINE W REFLEX MICROSCOPIC
Glucose, UA: NEGATIVE mg/dL
Hgb urine dipstick: NEGATIVE
Leukocytes, UA: NEGATIVE
Nitrite: NEGATIVE
Protein, ur: NEGATIVE mg/dL
Specific Gravity, Urine: 1.035 — ABNORMAL HIGH (ref 1.005–1.030)
Urobilinogen, UA: 0.2 mg/dL (ref 0.0–1.0)
pH: 6 (ref 5.0–8.0)

## 2011-07-07 ENCOUNTER — Emergency Department (HOSPITAL_COMMUNITY): Payer: Self-pay

## 2011-07-07 ENCOUNTER — Emergency Department (HOSPITAL_COMMUNITY)
Admission: EM | Admit: 2011-07-07 | Discharge: 2011-07-07 | Disposition: A | Discharge status: Home / Self Care | Payer: Self-pay | Attending: Emergency Medicine | Admitting: Emergency Medicine

## 2011-07-07 DIAGNOSIS — R6883 Chills (without fever): Secondary | ICD-10-CM | POA: Insufficient documentation

## 2011-07-07 DIAGNOSIS — R61 Generalized hyperhidrosis: Secondary | ICD-10-CM | POA: Insufficient documentation

## 2011-07-07 DIAGNOSIS — R197 Diarrhea, unspecified: Secondary | ICD-10-CM | POA: Insufficient documentation

## 2011-07-07 DIAGNOSIS — G8929 Other chronic pain: Secondary | ICD-10-CM | POA: Insufficient documentation

## 2011-07-07 DIAGNOSIS — R1011 Right upper quadrant pain: Secondary | ICD-10-CM | POA: Insufficient documentation

## 2011-07-07 DIAGNOSIS — R112 Nausea with vomiting, unspecified: Secondary | ICD-10-CM | POA: Insufficient documentation

## 2011-07-07 LAB — DIFFERENTIAL
Basophils Absolute: 0 10*3/uL (ref 0.0–0.1)
Basophils Relative: 1 % (ref 0–1)
Eosinophils Absolute: 0 10*3/uL (ref 0.0–0.7)
Eosinophils Relative: 0 % (ref 0–5)
Lymphocytes Relative: 20 % (ref 12–46)
Lymphs Abs: 1.5 10*3/uL (ref 0.7–4.0)
Monocytes Absolute: 0.5 10*3/uL (ref 0.1–1.0)
Monocytes Relative: 7 % (ref 3–12)
Neutro Abs: 5.4 10*3/uL (ref 1.7–7.7)
Neutrophils Relative %: 72 % (ref 43–77)

## 2011-07-07 LAB — URINALYSIS, ROUTINE W REFLEX MICROSCOPIC
Glucose, UA: NEGATIVE mg/dL
Hgb urine dipstick: NEGATIVE
Ketones, ur: 15 mg/dL — AB
Leukocytes, UA: NEGATIVE
Nitrite: NEGATIVE
Protein, ur: NEGATIVE mg/dL
Specific Gravity, Urine: 1.031 — ABNORMAL HIGH (ref 1.005–1.030)
Urobilinogen, UA: 0.2 mg/dL (ref 0.0–1.0)
pH: 6 (ref 5.0–8.0)

## 2011-07-07 LAB — CBC
HCT: 40.5 % (ref 39.0–52.0)
Hemoglobin: 14.6 g/dL (ref 13.0–17.0)
MCH: 31.5 pg (ref 26.0–34.0)
MCHC: 36 g/dL (ref 30.0–36.0)
MCV: 87.3 fL (ref 78.0–100.0)
Platelets: 174 10*3/uL (ref 150–400)
RBC: 4.64 MIL/uL (ref 4.22–5.81)
RDW: 12.9 % (ref 11.5–15.5)
WBC: 7.5 10*3/uL (ref 4.0–10.5)

## 2011-07-07 LAB — COMPREHENSIVE METABOLIC PANEL
ALT: 36 U/L (ref 0–53)
AST: 30 U/L (ref 0–37)
Albumin: 3.9 g/dL (ref 3.5–5.2)
Alkaline Phosphatase: 58 U/L (ref 39–117)
BUN: 11 mg/dL (ref 6–23)
CO2: 23 mEq/L (ref 19–32)
Calcium: 9.4 mg/dL (ref 8.4–10.5)
Chloride: 107 mEq/L (ref 96–112)
Creatinine, Ser: 0.9 mg/dL (ref 0.50–1.35)
GFR calc Af Amer: >60 mL/min (ref >60)
GFR calc non Af Amer: >60 mL/min (ref >60)
Glucose, Bld: 108 mg/dL — ABNORMAL HIGH (ref 70–99)
Potassium: 4.7 mEq/L (ref 3.5–5.1)
Sodium: 139 mEq/L (ref 135–145)
Total Bilirubin: 0.3 mg/dL (ref 0.3–1.2)
Total Protein: 7.3 g/dL (ref 6.0–8.3)

## 2011-07-07 LAB — LIPASE, BLOOD: Lipase: 33 U/L (ref 11–59)

## 2011-08-02 ENCOUNTER — Emergency Department (HOSPITAL_COMMUNITY): Payer: Self-pay

## 2011-08-02 ENCOUNTER — Emergency Department (HOSPITAL_COMMUNITY)
Admission: EM | Admit: 2011-08-02 | Discharge: 2011-08-02 | Disposition: A | Discharge status: Home / Self Care | Payer: Self-pay | Attending: Emergency Medicine | Admitting: Emergency Medicine

## 2011-08-02 DIAGNOSIS — Z87828 Personal history of other (healed) physical injury and trauma: Secondary | ICD-10-CM | POA: Insufficient documentation

## 2011-08-02 DIAGNOSIS — R112 Nausea with vomiting, unspecified: Secondary | ICD-10-CM | POA: Insufficient documentation

## 2011-08-02 DIAGNOSIS — Z9889 Other specified postprocedural states: Secondary | ICD-10-CM | POA: Insufficient documentation

## 2011-08-02 DIAGNOSIS — R109 Unspecified abdominal pain: Secondary | ICD-10-CM | POA: Insufficient documentation

## 2011-08-02 LAB — CBC
HCT: 42.6 % (ref 39.0–52.0)
Hemoglobin: 14.9 g/dL (ref 13.0–17.0)
MCH: 31.4 pg (ref 26.0–34.0)
MCHC: 35 g/dL (ref 30.0–36.0)
MCV: 89.7 fL (ref 78.0–100.0)
Platelets: 217 10*3/uL (ref 150–400)
RBC: 4.75 MIL/uL (ref 4.22–5.81)
RDW: 13 % (ref 11.5–15.5)
WBC: 10.7 10*3/uL — ABNORMAL HIGH (ref 4.0–10.5)

## 2011-08-02 LAB — COMPREHENSIVE METABOLIC PANEL
ALT: 24 U/L (ref 0–53)
AST: 23 U/L (ref 0–37)
Albumin: 4 g/dL (ref 3.5–5.2)
Alkaline Phosphatase: 65 U/L (ref 39–117)
BUN: 13 mg/dL (ref 6–23)
CO2: 24 mEq/L (ref 19–32)
Calcium: 9.6 mg/dL (ref 8.4–10.5)
Chloride: 106 mEq/L (ref 96–112)
Creatinine, Ser: 1.05 mg/dL (ref 0.50–1.35)
GFR calc Af Amer: >60 mL/min (ref >60)
GFR calc non Af Amer: >60 mL/min (ref >60)
Glucose, Bld: 121 mg/dL — ABNORMAL HIGH (ref 70–99)
Potassium: 4.1 mEq/L (ref 3.5–5.1)
Sodium: 141 mEq/L (ref 135–145)
Total Bilirubin: 0.4 mg/dL (ref 0.3–1.2)
Total Protein: 7.5 g/dL (ref 6.0–8.3)

## 2011-08-02 LAB — DIFFERENTIAL
Basophils Absolute: 0 10*3/uL (ref 0.0–0.1)
Basophils Relative: 0 % (ref 0–1)
Eosinophils Absolute: 0 10*3/uL (ref 0.0–0.7)
Eosinophils Relative: 0 % (ref 0–5)
Lymphocytes Relative: 15 % (ref 12–46)
Lymphs Abs: 1.6 10*3/uL (ref 0.7–4.0)
Monocytes Absolute: 0.9 10*3/uL (ref 0.1–1.0)
Monocytes Relative: 8 % (ref 3–12)
Neutro Abs: 8.2 10*3/uL — ABNORMAL HIGH (ref 1.7–7.7)
Neutrophils Relative %: 77 % (ref 43–77)

## 2011-08-02 LAB — LIPASE, BLOOD: Lipase: 20 U/L (ref 11–59)

## 2011-08-24 ENCOUNTER — Emergency Department (HOSPITAL_COMMUNITY)
Admission: EM | Admit: 2011-08-24 | Discharge: 2011-08-24 | Disposition: A | Discharge status: Home / Self Care | Payer: Self-pay | Attending: Emergency Medicine | Admitting: Emergency Medicine

## 2011-08-24 ENCOUNTER — Emergency Department (HOSPITAL_COMMUNITY): Payer: Self-pay

## 2011-08-24 DIAGNOSIS — R197 Diarrhea, unspecified: Secondary | ICD-10-CM | POA: Insufficient documentation

## 2011-08-24 DIAGNOSIS — G8929 Other chronic pain: Secondary | ICD-10-CM | POA: Insufficient documentation

## 2011-08-24 DIAGNOSIS — R1011 Right upper quadrant pain: Secondary | ICD-10-CM | POA: Insufficient documentation

## 2011-08-24 DIAGNOSIS — R109 Unspecified abdominal pain: Secondary | ICD-10-CM | POA: Insufficient documentation

## 2011-08-24 DIAGNOSIS — R1110 Vomiting, unspecified: Secondary | ICD-10-CM | POA: Insufficient documentation

## 2011-08-24 DIAGNOSIS — R112 Nausea with vomiting, unspecified: Secondary | ICD-10-CM | POA: Insufficient documentation

## 2011-08-24 LAB — URINE MICROSCOPIC-ADD ON

## 2011-08-24 LAB — DIFFERENTIAL
Basophils Absolute: 0 10*3/uL (ref 0.0–0.1)
Basophils Relative: 0 % (ref 0–1)
Eosinophils Absolute: 0.1 10*3/uL (ref 0.0–0.7)
Eosinophils Relative: 1 % (ref 0–5)
Lymphocytes Relative: 33 % (ref 12–46)
Lymphs Abs: 3 10*3/uL (ref 0.7–4.0)
Monocytes Absolute: 0.6 10*3/uL (ref 0.1–1.0)
Monocytes Relative: 6 % (ref 3–12)
Neutro Abs: 5.5 10*3/uL (ref 1.7–7.7)
Neutrophils Relative %: 60 % (ref 43–77)

## 2011-08-24 LAB — URINALYSIS, ROUTINE W REFLEX MICROSCOPIC
Glucose, UA: NEGATIVE mg/dL
Hgb urine dipstick: NEGATIVE
Ketones, ur: 15 mg/dL — AB
Nitrite: POSITIVE — AB
Protein, ur: NEGATIVE mg/dL
Specific Gravity, Urine: 1.03 (ref 1.005–1.030)
Urobilinogen, UA: 0.2 mg/dL (ref 0.0–1.0)
pH: 6 (ref 5.0–8.0)

## 2011-08-24 LAB — CBC
HCT: 43.9 % (ref 39.0–52.0)
Hemoglobin: 15.6 g/dL (ref 13.0–17.0)
MCH: 31.8 pg (ref 26.0–34.0)
MCHC: 35.5 g/dL (ref 30.0–36.0)
MCV: 89.6 fL (ref 78.0–100.0)
Platelets: 221 10*3/uL (ref 150–400)
RBC: 4.9 MIL/uL (ref 4.22–5.81)
RDW: 13.3 % (ref 11.5–15.5)
WBC: 9.2 10*3/uL (ref 4.0–10.5)

## 2011-08-24 LAB — COMPREHENSIVE METABOLIC PANEL
ALT: 39 U/L (ref 0–53)
AST: 37 U/L (ref 0–37)
Albumin: 4.7 g/dL (ref 3.5–5.2)
Alkaline Phosphatase: 69 U/L (ref 39–117)
BUN: 8 mg/dL (ref 6–23)
CO2: 28 mEq/L (ref 19–32)
Calcium: 10.1 mg/dL (ref 8.4–10.5)
Chloride: 104 mEq/L (ref 96–112)
Creatinine, Ser: 1.04 mg/dL (ref 0.50–1.35)
GFR calc Af Amer: >60 mL/min (ref >60)
GFR calc non Af Amer: >60 mL/min (ref >60)
Glucose, Bld: 122 mg/dL — ABNORMAL HIGH (ref 70–99)
Potassium: 4.3 mEq/L (ref 3.5–5.1)
Sodium: 141 mEq/L (ref 135–145)
Total Bilirubin: 0.4 mg/dL (ref 0.3–1.2)
Total Protein: 8.6 g/dL — ABNORMAL HIGH (ref 6.0–8.3)

## 2011-08-24 LAB — LIPASE, BLOOD: Lipase: 36 U/L (ref 11–59)

## 2011-08-25 ENCOUNTER — Emergency Department (HOSPITAL_COMMUNITY): Payer: Self-pay

## 2011-08-25 ENCOUNTER — Emergency Department (HOSPITAL_COMMUNITY)
Admission: EM | Admit: 2011-08-25 | Discharge: 2011-08-26 | Disposition: A | Discharge status: Home / Self Care | Payer: Self-pay | Attending: Emergency Medicine | Admitting: Emergency Medicine

## 2011-08-25 DIAGNOSIS — G8929 Other chronic pain: Secondary | ICD-10-CM | POA: Insufficient documentation

## 2011-08-25 DIAGNOSIS — R630 Anorexia: Secondary | ICD-10-CM | POA: Insufficient documentation

## 2011-08-25 DIAGNOSIS — Z79899 Other long term (current) drug therapy: Secondary | ICD-10-CM | POA: Insufficient documentation

## 2011-08-25 DIAGNOSIS — R109 Unspecified abdominal pain: Secondary | ICD-10-CM | POA: Insufficient documentation

## 2011-08-25 DIAGNOSIS — K625 Hemorrhage of anus and rectum: Secondary | ICD-10-CM | POA: Insufficient documentation

## 2011-08-25 DIAGNOSIS — R1110 Vomiting, unspecified: Secondary | ICD-10-CM | POA: Insufficient documentation

## 2011-08-25 LAB — DIFFERENTIAL
Basophils Absolute: 0 10*3/uL (ref 0.0–0.1)
Basophils Relative: 0 % (ref 0–1)
Eosinophils Absolute: 0 10*3/uL (ref 0.0–0.7)
Eosinophils Relative: 0 % (ref 0–5)
Lymphocytes Relative: 26 % (ref 12–46)
Lymphs Abs: 2.5 10*3/uL (ref 0.7–4.0)
Monocytes Absolute: 0.9 10*3/uL (ref 0.1–1.0)
Monocytes Relative: 9 % (ref 3–12)
Neutro Abs: 6.3 10*3/uL (ref 1.7–7.7)
Neutrophils Relative %: 64 % (ref 43–77)

## 2011-08-25 LAB — COMPREHENSIVE METABOLIC PANEL
ALT: 32 U/L (ref 0–53)
AST: 30 U/L (ref 0–37)
Albumin: 4.2 g/dL (ref 3.5–5.2)
Alkaline Phosphatase: 62 U/L (ref 39–117)
BUN: 9 mg/dL (ref 6–23)
CO2: 26 mEq/L (ref 19–32)
Calcium: 9.3 mg/dL (ref 8.4–10.5)
Chloride: 103 mEq/L (ref 96–112)
Creatinine, Ser: 1.01 mg/dL (ref 0.50–1.35)
GFR calc Af Amer: >60 mL/min (ref >60)
GFR calc non Af Amer: >60 mL/min (ref >60)
Glucose, Bld: 112 mg/dL — ABNORMAL HIGH (ref 70–99)
Potassium: 4 mEq/L (ref 3.5–5.1)
Sodium: 138 mEq/L (ref 135–145)
Total Bilirubin: 0.6 mg/dL (ref 0.3–1.2)
Total Protein: 7.3 g/dL (ref 6.0–8.3)

## 2011-08-25 LAB — URINALYSIS, ROUTINE W REFLEX MICROSCOPIC
Glucose, UA: NEGATIVE mg/dL
Ketones, ur: 15 mg/dL — AB
Nitrite: POSITIVE — AB
Protein, ur: 30 mg/dL — AB
Specific Gravity, Urine: 1.024 (ref 1.005–1.030)
Urobilinogen, UA: 0.2 mg/dL (ref 0.0–1.0)
pH: 6 (ref 5.0–8.0)

## 2011-08-25 LAB — CBC
HCT: 39.5 % (ref 39.0–52.0)
Hemoglobin: 13.8 g/dL (ref 13.0–17.0)
MCH: 31.2 pg (ref 26.0–34.0)
MCHC: 34.9 g/dL (ref 30.0–36.0)
MCV: 89.2 fL (ref 78.0–100.0)
Platelets: 192 10*3/uL (ref 150–400)
RBC: 4.43 MIL/uL (ref 4.22–5.81)
RDW: 13.2 % (ref 11.5–15.5)
WBC: 9.8 10*3/uL (ref 4.0–10.5)

## 2011-08-25 LAB — LIPASE, BLOOD: Lipase: 22 U/L (ref 11–59)

## 2011-08-25 LAB — URINE MICROSCOPIC-ADD ON

## 2011-08-25 LAB — PROTIME-INR
INR: 1.04 (ref 0.00–1.49)
Prothrombin Time: 13.8 seconds (ref 11.6–15.2)

## 2011-08-26 MED ORDER — IOHEXOL 300 MG/ML  SOLN
80.0000 mL | Freq: Once | INTRAMUSCULAR | Status: AC | PRN
  Administered 2011-08-26: 80 mL via INTRAVENOUS

## 2011-09-02 ENCOUNTER — Emergency Department (HOSPITAL_COMMUNITY)
Admission: EM | Admit: 2011-09-02 | Discharge: 2011-09-02 | Disposition: A | Discharge status: Home / Self Care | Payer: Self-pay | Attending: Emergency Medicine | Admitting: Emergency Medicine

## 2011-09-02 ENCOUNTER — Emergency Department (HOSPITAL_COMMUNITY): Payer: Self-pay

## 2011-09-02 DIAGNOSIS — F17200 Nicotine dependence, unspecified, uncomplicated: Secondary | ICD-10-CM | POA: Insufficient documentation

## 2011-09-02 DIAGNOSIS — R195 Other fecal abnormalities: Secondary | ICD-10-CM | POA: Insufficient documentation

## 2011-09-02 DIAGNOSIS — R109 Unspecified abdominal pain: Secondary | ICD-10-CM | POA: Insufficient documentation

## 2011-09-02 LAB — CBC
HCT: 40.3 % (ref 39.0–52.0)
Hemoglobin: 14.6 g/dL (ref 13.0–17.0)
MCH: 31.9 pg (ref 26.0–34.0)
MCHC: 36.2 g/dL — ABNORMAL HIGH (ref 30.0–36.0)
MCV: 88.2 fL (ref 78.0–100.0)
Platelets: 199 10*3/uL (ref 150–400)
RBC: 4.57 MIL/uL (ref 4.22–5.81)
RDW: 13.1 % (ref 11.5–15.5)
WBC: 9.1 10*3/uL (ref 4.0–10.5)

## 2011-09-02 LAB — DIFFERENTIAL
Basophils Absolute: 0 10*3/uL (ref 0.0–0.1)
Basophils Relative: 0 % (ref 0–1)
Eosinophils Absolute: 0 10*3/uL (ref 0.0–0.7)
Eosinophils Relative: 0 % (ref 0–5)
Lymphocytes Relative: 22 % (ref 12–46)
Lymphs Abs: 2 10*3/uL (ref 0.7–4.0)
Monocytes Absolute: 0.8 10*3/uL (ref 0.1–1.0)
Monocytes Relative: 9 % (ref 3–12)
Neutro Abs: 6.2 10*3/uL (ref 1.7–7.7)
Neutrophils Relative %: 69 % (ref 43–77)

## 2011-09-02 LAB — POCT I-STAT, CHEM 8
BUN: 10 mg/dL (ref 6–23)
Calcium, Ion: 1.19 mmol/L (ref 1.12–1.32)
Chloride: 110 mEq/L (ref 96–112)
Creatinine, Ser: 1 mg/dL (ref 0.50–1.35)
Glucose, Bld: 130 mg/dL — ABNORMAL HIGH (ref 70–99)
HCT: 45 % (ref 39.0–52.0)
Hemoglobin: 15.3 g/dL (ref 13.0–17.0)
Potassium: 4.6 mEq/L (ref 3.5–5.1)
Sodium: 140 mEq/L (ref 135–145)
TCO2: 23 mmol/L (ref 0–100)

## 2011-09-02 LAB — URINALYSIS, ROUTINE W REFLEX MICROSCOPIC
Bilirubin Urine: NEGATIVE
Glucose, UA: NEGATIVE mg/dL
Hgb urine dipstick: NEGATIVE
Ketones, ur: 15 mg/dL — AB
Nitrite: NEGATIVE
Protein, ur: NEGATIVE mg/dL
Specific Gravity, Urine: 1.028 (ref 1.005–1.030)
Urobilinogen, UA: 1 mg/dL (ref 0.0–1.0)
pH: 8.5 — ABNORMAL HIGH (ref 5.0–8.0)

## 2011-09-02 LAB — URINE MICROSCOPIC-ADD ON

## 2011-09-02 LAB — OCCULT BLOOD, POC DEVICE: Fecal Occult Bld: NEGATIVE

## 2011-09-18 LAB — URINALYSIS, ROUTINE W REFLEX MICROSCOPIC
Bilirubin Urine: NEGATIVE
Glucose, UA: NEGATIVE
Glucose, UA: NEGATIVE
Hgb urine dipstick: NEGATIVE
Hgb urine dipstick: NEGATIVE
Ketones, ur: 15 — AB
Ketones, ur: 15 — AB
Leukocytes, UA: NEGATIVE
Nitrite: NEGATIVE
Nitrite: NEGATIVE
Protein, ur: 100 — AB
Protein, ur: NEGATIVE
Specific Gravity, Urine: 1.029
Specific Gravity, Urine: 1.039 — ABNORMAL HIGH
Urobilinogen, UA: 1
Urobilinogen, UA: 1
pH: 6
pH: 8.5 — ABNORMAL HIGH

## 2011-09-18 LAB — CBC
HCT: 40.8
HCT: 41.1
Hemoglobin: 13.7
Hemoglobin: 13.8
MCHC: 33.6
MCHC: 33.7
MCV: 92
MCV: 92.4
Platelets: 179
Platelets: 210
RBC: 4.41
RBC: 4.47
RDW: 12.8
RDW: 12.8
WBC: 10.4
WBC: 7.2

## 2011-09-18 LAB — RAPID URINE DRUG SCREEN, HOSP PERFORMED
Amphetamines: NOT DETECTED
Barbiturates: NOT DETECTED
Benzodiazepines: NOT DETECTED
Cocaine: NOT DETECTED
Opiates: POSITIVE — AB
Tetrahydrocannabinol: POSITIVE — AB

## 2011-09-18 LAB — ETHANOL: Alcohol, Ethyl (B): <5

## 2011-09-18 LAB — COMPREHENSIVE METABOLIC PANEL
ALT: 31
ALT: 32
AST: 33
AST: 52 — ABNORMAL HIGH
Albumin: 4
Albumin: 4.3
Alkaline Phosphatase: 44
Alkaline Phosphatase: 52
BUN: 13
BUN: 6
CO2: 23
CO2: 26
Calcium: 9.6
Calcium: 9.9
Chloride: 104
Chloride: 107
Creatinine, Ser: 1.04
Creatinine, Ser: 1.07
GFR calc Af Amer: >60
GFR calc Af Amer: >60
GFR calc non Af Amer: >60
GFR calc non Af Amer: >60
Glucose, Bld: 126 — ABNORMAL HIGH
Glucose, Bld: 136 — ABNORMAL HIGH
Potassium: 3.4 — ABNORMAL LOW
Potassium: 3.6
Sodium: 140
Sodium: 143
Total Bilirubin: 0.6
Total Bilirubin: 0.7
Total Protein: 7.1
Total Protein: 7.7

## 2011-09-18 LAB — DIFFERENTIAL
Basophils Absolute: 0
Basophils Absolute: 0
Basophils Relative: 0
Basophils Relative: 0
Eosinophils Absolute: 0
Eosinophils Absolute: 0
Eosinophils Relative: 0
Eosinophils Relative: 0
Lymphocytes Relative: 12
Lymphocytes Relative: 29
Lymphs Abs: 1.3
Lymphs Abs: 2.1
Monocytes Absolute: 0.4
Monocytes Absolute: 0.4
Monocytes Relative: 4
Monocytes Relative: 6
Neutro Abs: 4.6
Neutro Abs: 8.7 — ABNORMAL HIGH
Neutrophils Relative %: 65
Neutrophils Relative %: 84 — ABNORMAL HIGH

## 2011-09-18 LAB — URINE MICROSCOPIC-ADD ON

## 2011-09-18 LAB — LIPASE, BLOOD
Lipase: 18
Lipase: 21

## 2011-09-19 LAB — COMPREHENSIVE METABOLIC PANEL
ALT: 17
ALT: 19
ALT: 22
ALT: 24
ALT: 24
AST: 27
AST: 27
AST: 29
AST: 29
AST: 37
Albumin: 3.1 — ABNORMAL LOW
Albumin: 3.5
Albumin: 3.6
Albumin: 4.1
Albumin: 4.2
Alkaline Phosphatase: 38 — ABNORMAL LOW
Alkaline Phosphatase: 41
Alkaline Phosphatase: 42
Alkaline Phosphatase: 46
Alkaline Phosphatase: 46
BUN: 13
BUN: 5 — ABNORMAL LOW
BUN: 6
BUN: 7
BUN: 7
CO2: 21
CO2: 23
CO2: 25
CO2: 25
CO2: 27
Calcium: 8.8
Calcium: 9.1
Calcium: 9.1
Calcium: 9.3
Calcium: 9.6
Chloride: 103
Chloride: 103
Chloride: 106
Chloride: 107
Chloride: 99
Creatinine, Ser: 1.02
Creatinine, Ser: 1.07
Creatinine, Ser: 1.08
Creatinine, Ser: 1.14
Creatinine, Ser: 1.2
GFR calc Af Amer: >60
GFR calc Af Amer: >60
GFR calc Af Amer: >60
GFR calc Af Amer: >60
GFR calc Af Amer: >60
GFR calc non Af Amer: >60
GFR calc non Af Amer: >60
GFR calc non Af Amer: >60
GFR calc non Af Amer: >60
GFR calc non Af Amer: >60
Glucose, Bld: 108 — ABNORMAL HIGH
Glucose, Bld: 124 — ABNORMAL HIGH
Glucose, Bld: 126 — ABNORMAL HIGH
Glucose, Bld: 128 — ABNORMAL HIGH
Glucose, Bld: 73
Potassium: 2.8 — ABNORMAL LOW
Potassium: 3.2 — ABNORMAL LOW
Potassium: 3.4 — ABNORMAL LOW
Potassium: 3.9
Potassium: 4.3
Sodium: 135
Sodium: 136
Sodium: 137
Sodium: 137
Sodium: 139
Total Bilirubin: 0.7
Total Bilirubin: 1
Total Bilirubin: 1
Total Bilirubin: 1
Total Bilirubin: 1.2
Total Protein: 6
Total Protein: 6.4
Total Protein: 6.6
Total Protein: 7.1
Total Protein: 7.3

## 2011-09-19 LAB — URINALYSIS, ROUTINE W REFLEX MICROSCOPIC
Bilirubin Urine: NEGATIVE
Bilirubin Urine: NEGATIVE
Bilirubin Urine: NEGATIVE
Bilirubin Urine: NEGATIVE
Glucose, UA: NEGATIVE
Glucose, UA: NEGATIVE
Glucose, UA: NEGATIVE
Glucose, UA: NEGATIVE
Hgb urine dipstick: NEGATIVE
Hgb urine dipstick: NEGATIVE
Hgb urine dipstick: NEGATIVE
Hgb urine dipstick: NEGATIVE
Ketones, ur: 15 — AB
Ketones, ur: 15 — AB
Ketones, ur: 40 — AB
Leukocytes, UA: NEGATIVE
Nitrite: NEGATIVE
Nitrite: NEGATIVE
Nitrite: NEGATIVE
Nitrite: NEGATIVE
Protein, ur: NEGATIVE
Protein, ur: NEGATIVE
Protein, ur: NEGATIVE
Protein, ur: NEGATIVE
Specific Gravity, Urine: 1.027
Specific Gravity, Urine: 1.028
Specific Gravity, Urine: 1.029
Specific Gravity, Urine: 1.029
Urobilinogen, UA: 0.2
Urobilinogen, UA: 0.2
Urobilinogen, UA: 0.2
Urobilinogen, UA: 1
pH: 5.5
pH: 6.5
pH: 7.5
pH: 8

## 2011-09-19 LAB — RAPID URINE DRUG SCREEN, HOSP PERFORMED
Amphetamines: NOT DETECTED
Barbiturates: NOT DETECTED
Benzodiazepines: NOT DETECTED
Cocaine: NOT DETECTED
Opiates: POSITIVE — AB
Tetrahydrocannabinol: POSITIVE — AB

## 2011-09-19 LAB — CBC
HCT: 41.7
HCT: 41.8
HCT: 41.8
HCT: 41.9
Hemoglobin: 13.9
Hemoglobin: 14.1
Hemoglobin: 14.1
Hemoglobin: 14.2
MCHC: 33.3
MCHC: 33.6
MCHC: 33.6
MCHC: 33.9
MCV: 91.3
MCV: 91.4
MCV: 92.6
MCV: 92.8
Platelets: 206
Platelets: 209
Platelets: 210
Platelets: 212
RBC: 4.5
RBC: 4.52
RBC: 4.58
RBC: 4.58
RDW: 12.7
RDW: 12.9
RDW: 13.1
RDW: 13.3
WBC: 7.8
WBC: 8.6
WBC: 8.8
WBC: 9.3

## 2011-09-19 LAB — DIFFERENTIAL
Basophils Absolute: 0
Basophils Absolute: 0
Basophils Absolute: 0.1
Basophils Relative: 0
Basophils Relative: 1
Basophils Relative: 1
Eosinophils Absolute: 0
Eosinophils Absolute: 0
Eosinophils Absolute: 0.1
Eosinophils Relative: 0
Eosinophils Relative: 0
Eosinophils Relative: 1
Lymphocytes Relative: 17
Lymphocytes Relative: 26
Lymphocytes Relative: 34
Lymphs Abs: 1.5
Lymphs Abs: 2.4
Lymphs Abs: 2.6
Monocytes Absolute: 0.3
Monocytes Absolute: 0.5
Monocytes Absolute: 0.5
Monocytes Relative: 4
Monocytes Relative: 6
Monocytes Relative: 7
Neutro Abs: 4.5
Neutro Abs: 6.3
Neutro Abs: 6.8
Neutrophils Relative %: 58
Neutrophils Relative %: 67
Neutrophils Relative %: 79 — ABNORMAL HIGH

## 2011-09-19 LAB — URINE CULTURE
Colony Count: NO GROWTH
Culture: NO GROWTH

## 2011-09-19 LAB — SEDIMENTATION RATE: Sed Rate: 0

## 2011-09-19 LAB — LIPASE, BLOOD
Lipase: 25
Lipase: 27
Lipase: 27
Lipase: 28

## 2011-09-19 LAB — URINE MICROSCOPIC-ADD ON

## 2011-09-21 LAB — DIFFERENTIAL
Basophils Absolute: 0
Basophils Relative: 0
Eosinophils Absolute: 0
Eosinophils Relative: 0
Lymphocytes Relative: 14
Lymphs Abs: 1.1
Monocytes Absolute: 0.4
Monocytes Relative: 5
Neutro Abs: 6.6
Neutrophils Relative %: 81 — ABNORMAL HIGH

## 2011-09-21 LAB — CBC
HCT: 44.8
Hemoglobin: 14.8
MCHC: 33.1
MCV: 93.6
Platelets: ADEQUATE
RBC: 4.78
RDW: 13.3
WBC: 8.1

## 2011-09-21 LAB — I-STAT 8, (EC8 V) (CONVERTED LAB)
Acid-Base Excess: 1
BUN: 10
Bicarbonate: 23.4
Chloride: 109
Glucose, Bld: 133 — ABNORMAL HIGH
HCT: 48
Hemoglobin: 16.3
Operator id: 288331
Potassium: 4.3
Sodium: 139
TCO2: 24
pCO2, Ven: 32.2 — ABNORMAL LOW
pH, Ven: 7.471 — ABNORMAL HIGH

## 2011-09-21 LAB — LIPASE, BLOOD: Lipase: 24

## 2011-09-21 LAB — POCT I-STAT CREATININE
Creatinine, Ser: 1.1
Operator id: 288331

## 2011-09-22 ENCOUNTER — Emergency Department (HOSPITAL_COMMUNITY)
Admission: EM | Admit: 2011-09-22 | Discharge: 2011-09-22 | Disposition: A | Discharge status: Home / Self Care | Payer: Self-pay | Attending: Emergency Medicine | Admitting: Emergency Medicine

## 2011-09-22 ENCOUNTER — Emergency Department (HOSPITAL_COMMUNITY): Payer: Self-pay

## 2011-09-22 DIAGNOSIS — R1013 Epigastric pain: Secondary | ICD-10-CM | POA: Insufficient documentation

## 2011-09-22 DIAGNOSIS — G8929 Other chronic pain: Secondary | ICD-10-CM | POA: Insufficient documentation

## 2011-09-22 LAB — URINALYSIS, ROUTINE W REFLEX MICROSCOPIC
Bilirubin Urine: NEGATIVE
Glucose, UA: NEGATIVE mg/dL
Hgb urine dipstick: NEGATIVE
Ketones, ur: 15 mg/dL — AB
Leukocytes, UA: NEGATIVE
Nitrite: NEGATIVE
Protein, ur: NEGATIVE mg/dL
Specific Gravity, Urine: 1.03 (ref 1.005–1.030)
Urobilinogen, UA: 1 mg/dL (ref 0.0–1.0)
pH: 6.5 (ref 5.0–8.0)

## 2011-09-22 LAB — COMPREHENSIVE METABOLIC PANEL
ALT: 29 U/L (ref 0–53)
AST: 25 U/L (ref 0–37)
Albumin: 4.4 g/dL (ref 3.5–5.2)
Alkaline Phosphatase: 64 U/L (ref 39–117)
BUN: 8 mg/dL (ref 6–23)
CO2: 27 mEq/L (ref 19–32)
Calcium: 9.8 mg/dL (ref 8.4–10.5)
Chloride: 103 mEq/L (ref 96–112)
Creatinine, Ser: 1.02 mg/dL (ref 0.50–1.35)
GFR calc Af Amer: >90 mL/min (ref >90)
GFR calc non Af Amer: 86 mL/min — ABNORMAL LOW (ref >90)
Glucose, Bld: 103 mg/dL — ABNORMAL HIGH (ref 70–99)
Potassium: 4.3 mEq/L (ref 3.5–5.1)
Sodium: 140 mEq/L (ref 135–145)
Total Bilirubin: 0.3 mg/dL (ref 0.3–1.2)
Total Protein: 7.9 g/dL (ref 6.0–8.3)

## 2011-09-22 LAB — CBC
HCT: 43 % (ref 39.0–52.0)
Hemoglobin: 15 g/dL (ref 13.0–17.0)
MCH: 31.6 pg (ref 26.0–34.0)
MCHC: 34.9 g/dL (ref 30.0–36.0)
MCV: 90.5 fL (ref 78.0–100.0)
Platelets: 205 10*3/uL (ref 150–400)
RBC: 4.75 MIL/uL (ref 4.22–5.81)
RDW: 13.1 % (ref 11.5–15.5)
WBC: 8 10*3/uL (ref 4.0–10.5)

## 2011-09-22 LAB — LIPASE, BLOOD: Lipase: 31 U/L (ref 11–59)
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
## 2011-09-25 IMAGING — CR DG ABDOMEN 2V
2 series · 2 of 2 positions shown · non-contrast
Comparison: 09/30/2010

CLINICAL DATA: Abdominal pain.  Nausea vomiting.

ABDOMEN - 2 VIEW

[w abdomen upright]
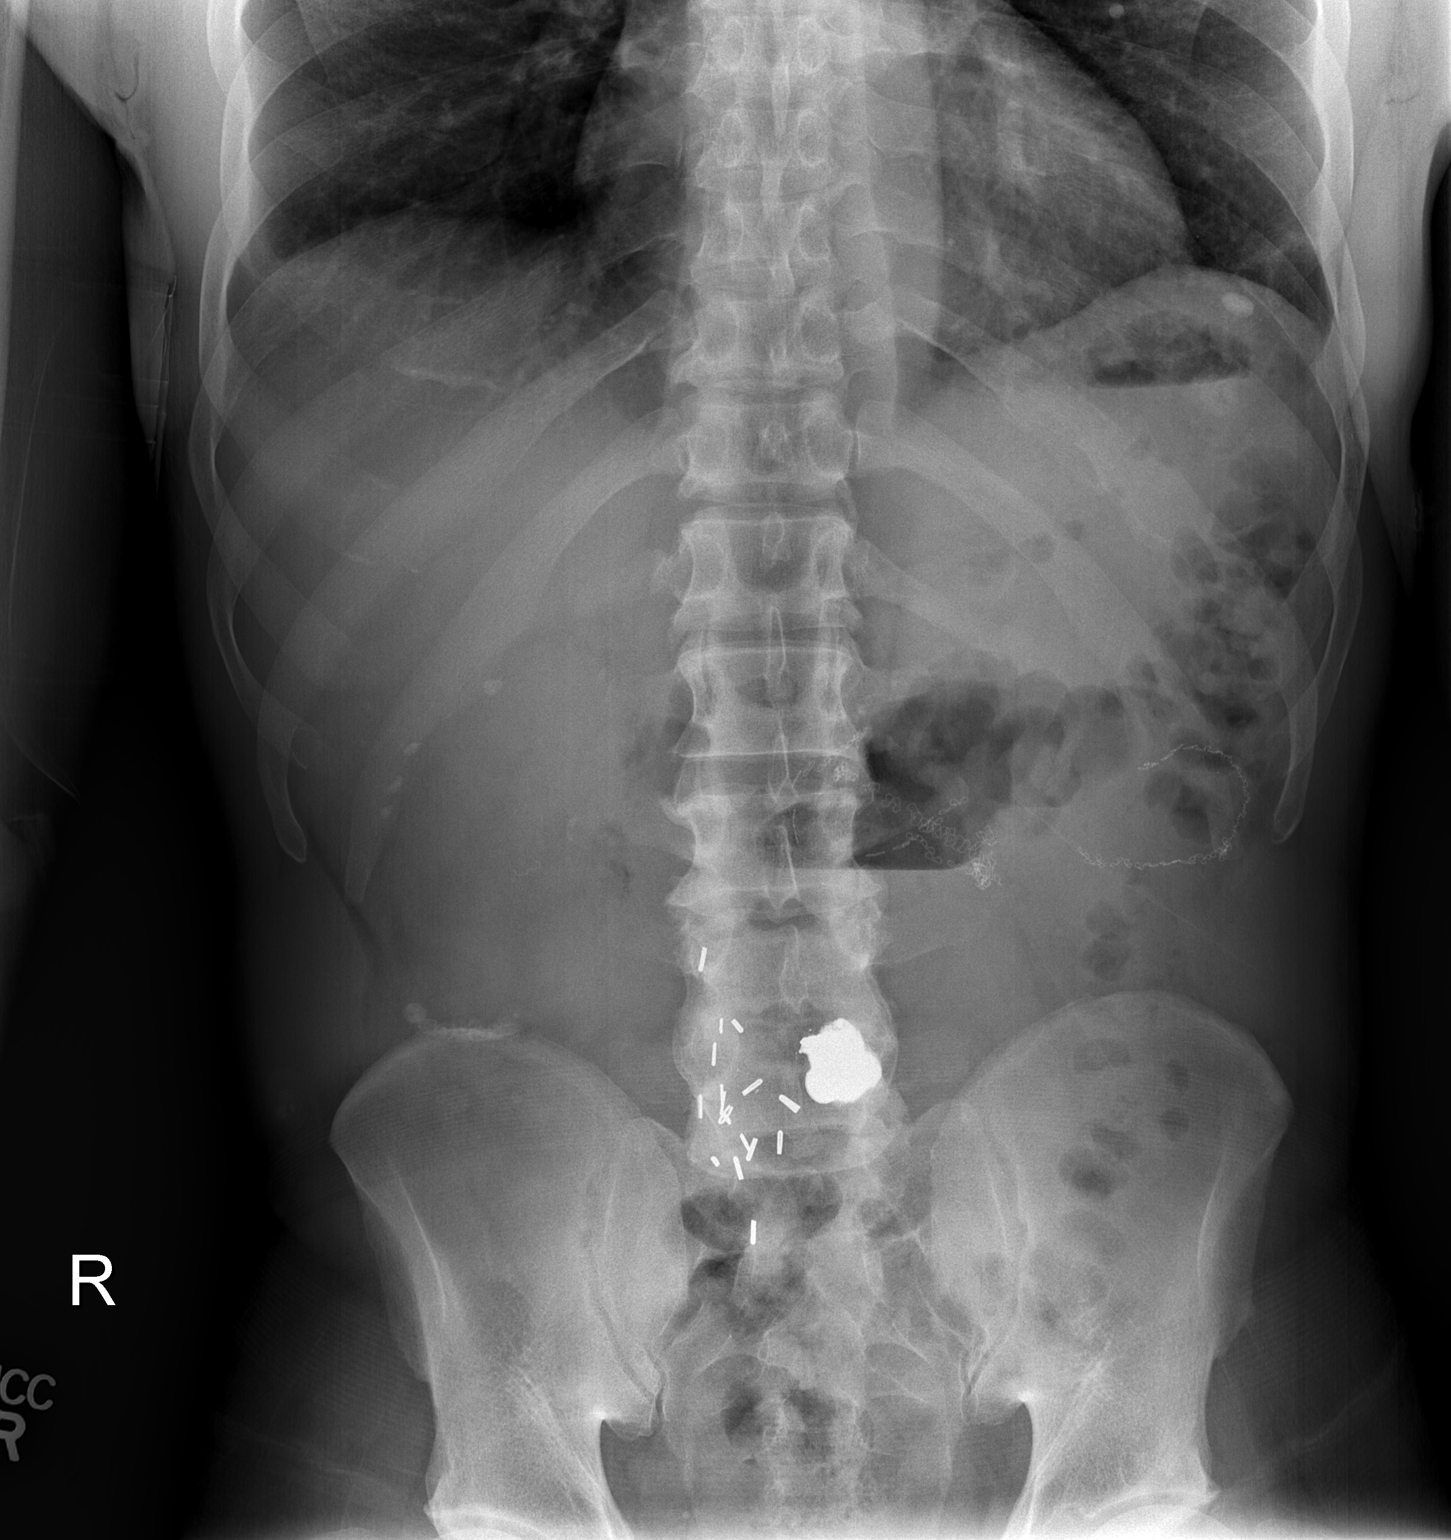

[t abdomen supine]
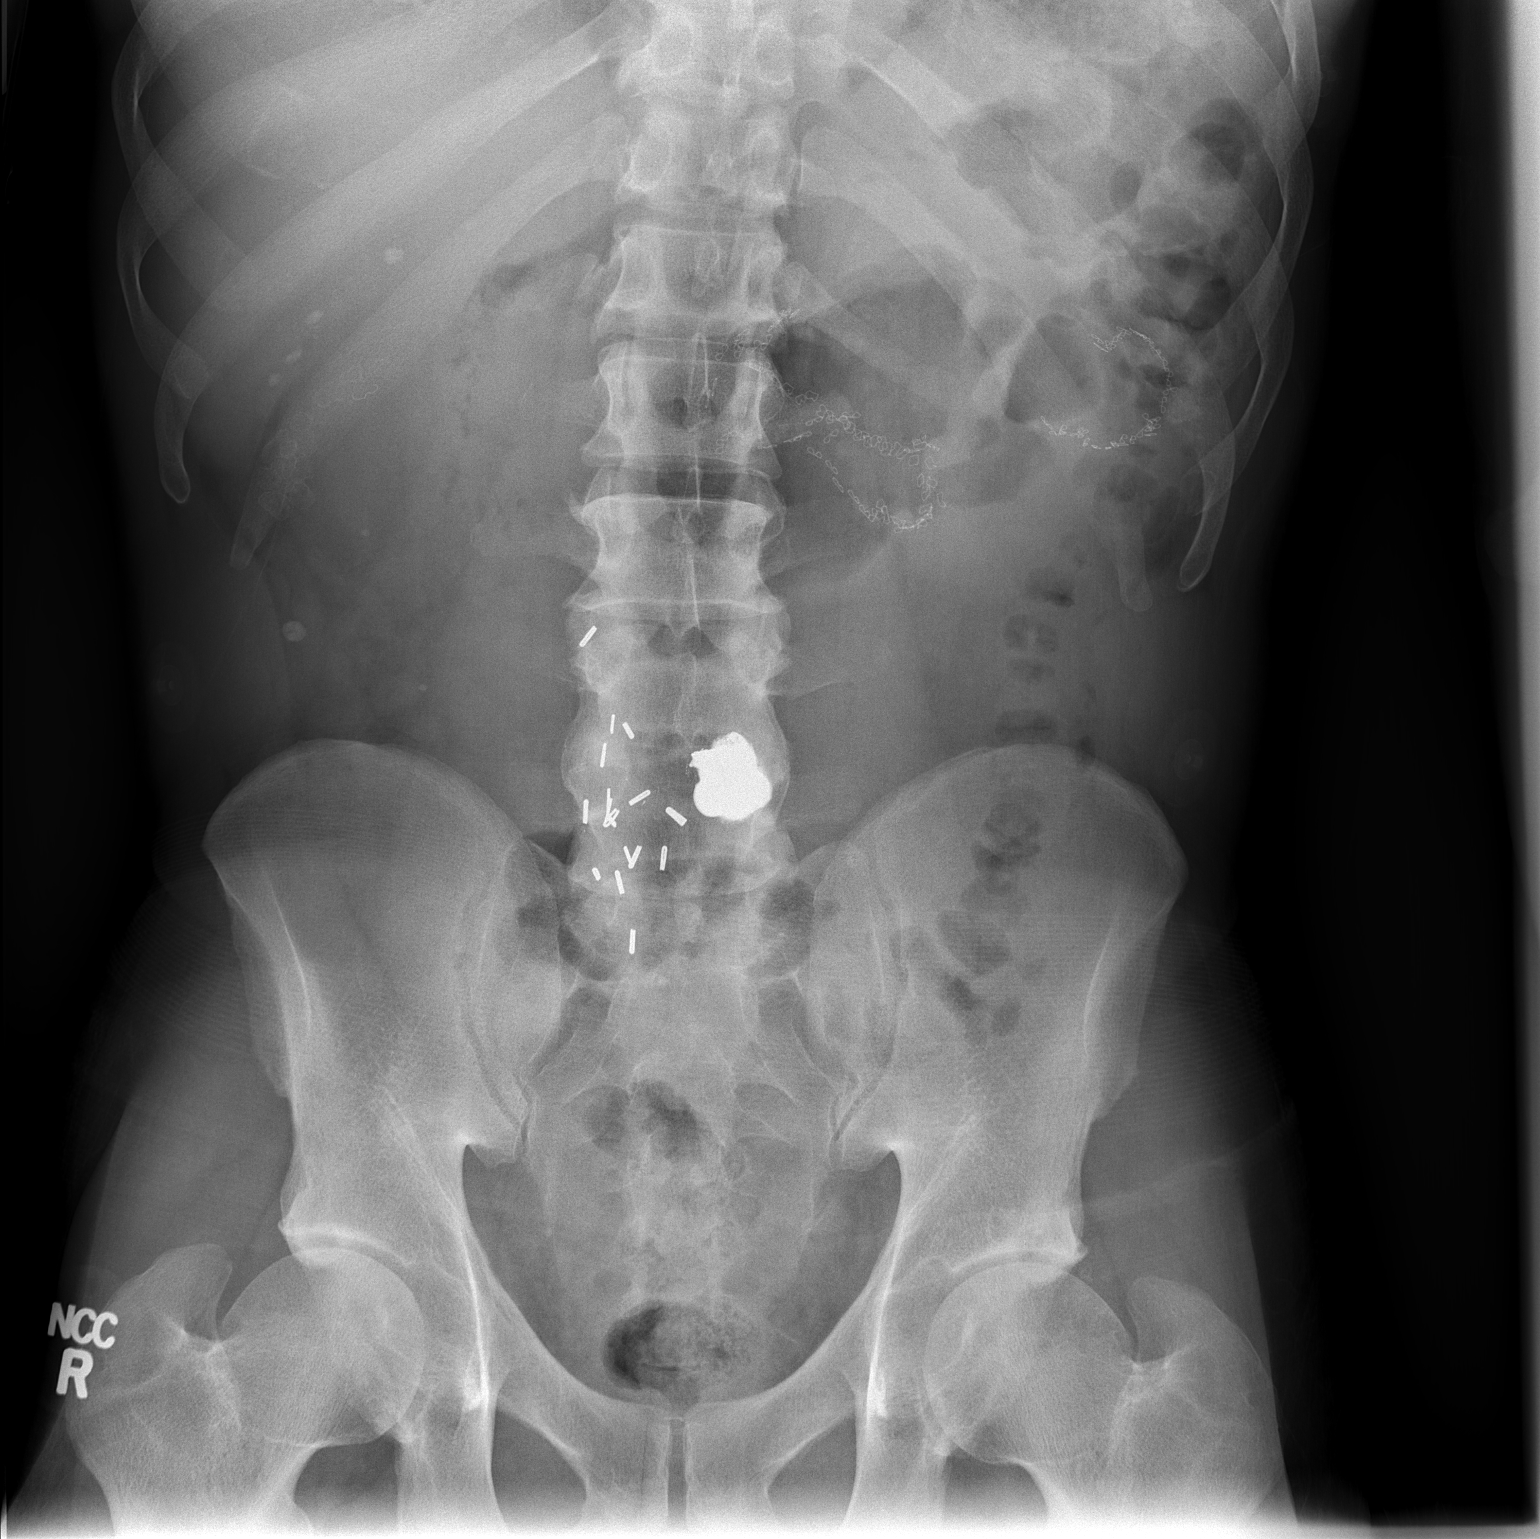

[2 of 2 positions shown; findings below may reference images not displayed]

FINDINGS: Upright abdomen shows no evidence for intraperitoneal
free air.  The supine film shows no gaseous bowel dilatation to
suggest obstruction.  There is some gas visible in the duodenal
loop.  The numerous areas of suture are seen throughout the
abdomen.  Air-fluid  level in the central abdomen on the upright
film is probably the somewhat patulous ileocolic anastomosis in
this patient status post right hemicolectomy (see CT scan from
09/30/2010).  Calcified stones are noted in the gallbladder lumen.
No gaseous bowel dilatation to suggest obstruction.
IMPRESSION: No evidence for bowel perforation or obstruction.

Cholelithiasis.

## 2011-09-26 ENCOUNTER — Emergency Department (HOSPITAL_COMMUNITY)
Admission: EM | Admit: 2011-09-26 | Discharge: 2011-09-26 | Disposition: A | Discharge status: Home / Self Care | Payer: Self-pay | Attending: Emergency Medicine | Admitting: Emergency Medicine

## 2011-09-26 DIAGNOSIS — F411 Generalized anxiety disorder: Secondary | ICD-10-CM | POA: Insufficient documentation

## 2011-09-26 DIAGNOSIS — R630 Anorexia: Secondary | ICD-10-CM | POA: Insufficient documentation

## 2011-09-26 DIAGNOSIS — R1110 Vomiting, unspecified: Secondary | ICD-10-CM | POA: Insufficient documentation

## 2011-09-26 DIAGNOSIS — R109 Unspecified abdominal pain: Secondary | ICD-10-CM | POA: Insufficient documentation
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
## 2011-09-27 IMAGING — US US ABDOMEN COMPLETE
1 series · 14 of 25 positions shown · non-contrast
Comparison: 

CLINICAL DATA: Pain.

COMPLETE ABDOMINAL ULTRASOUND

[Series 1: us abdomen complete · 0.28mm/px · 14 of 77 slices shown]
[im 1/77]
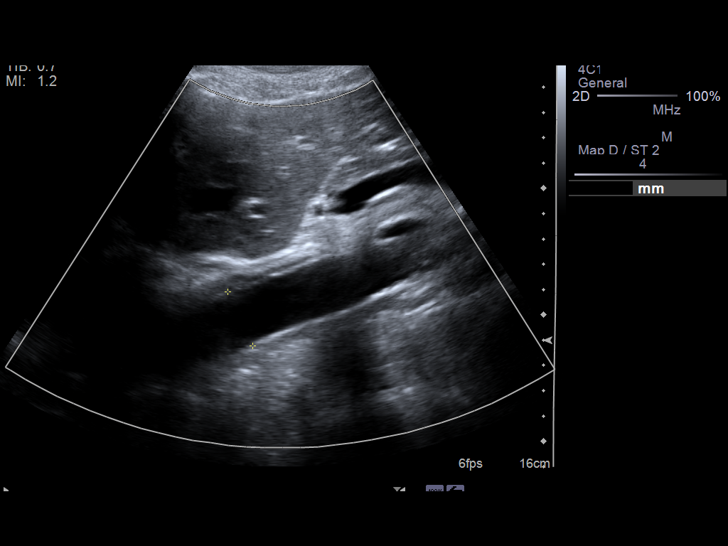
[im 7/77]
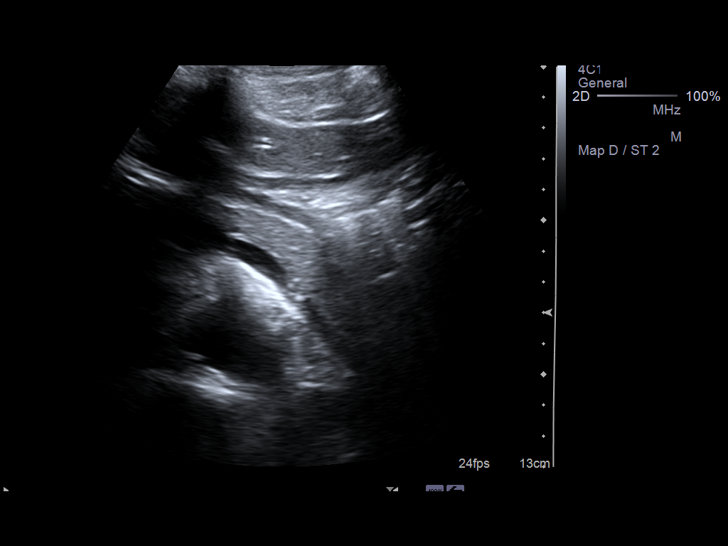
[im 13/77]
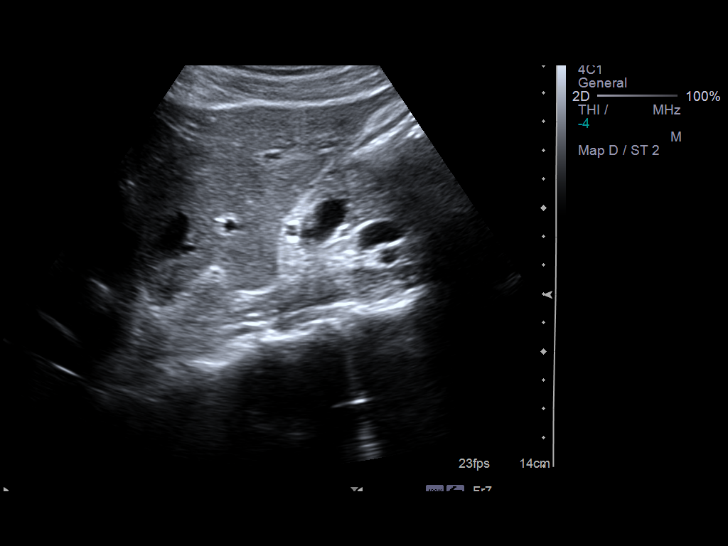
[im 20/77]
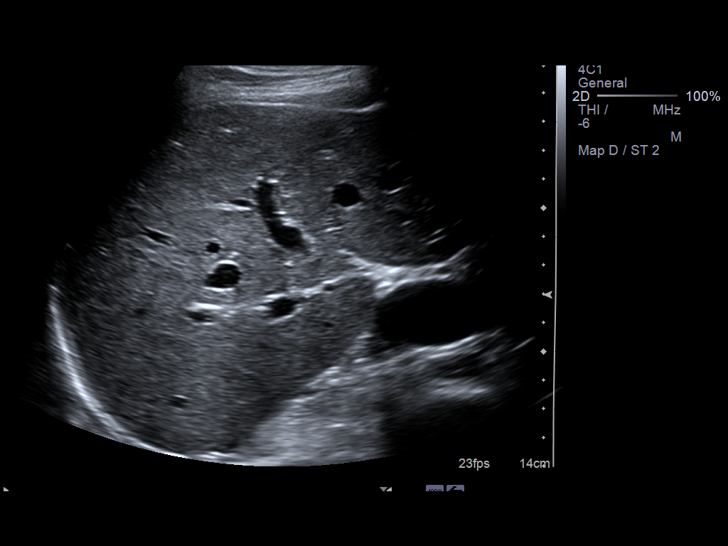
[im 26/77]
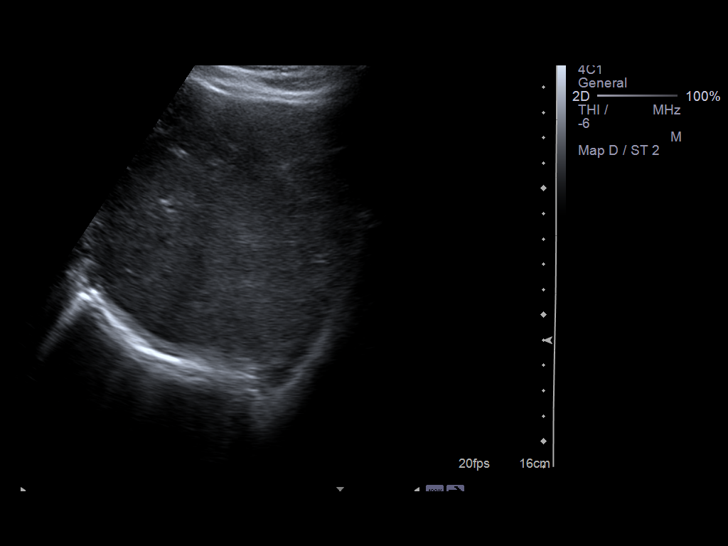
[im 29/77]
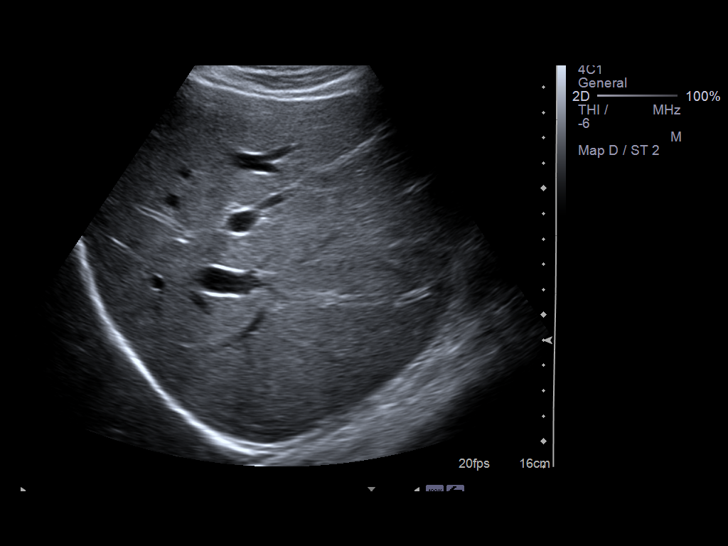
[im 35/77]
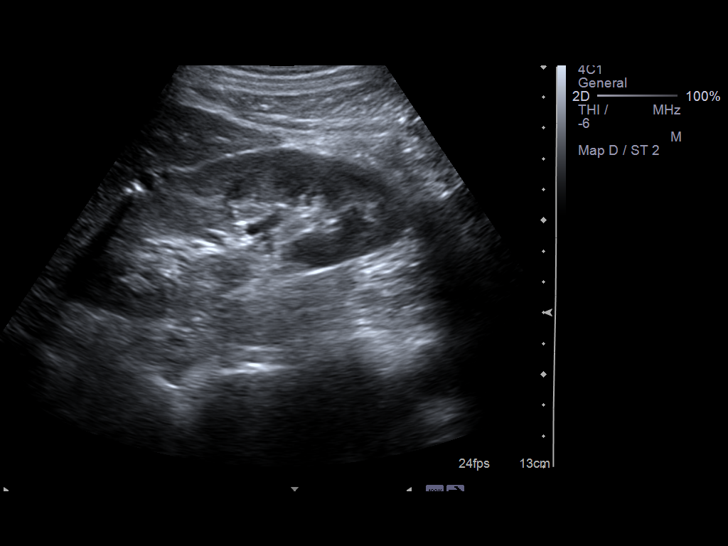
[im 42/77]
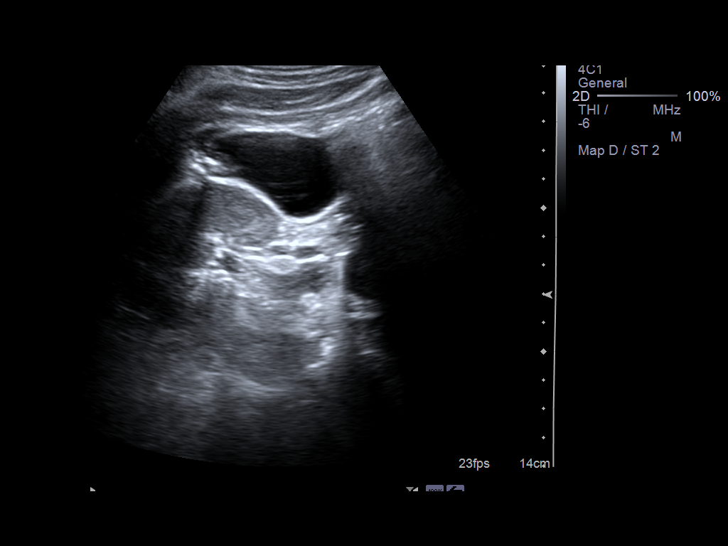
[im 48/77]
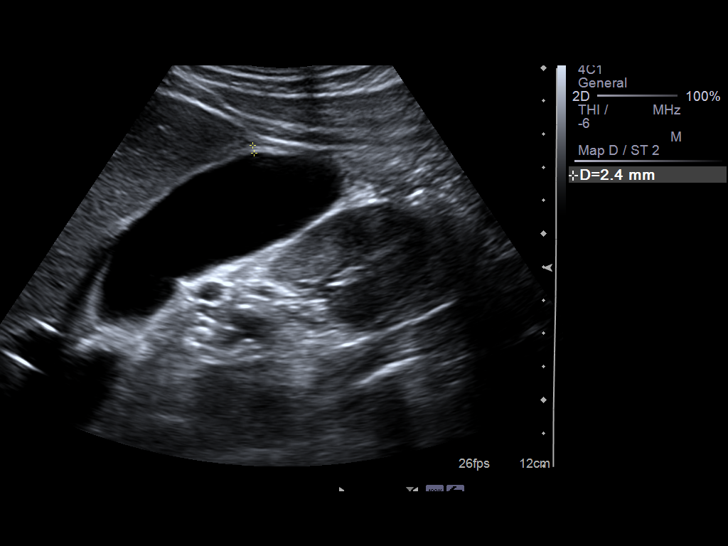
[im 51/77]
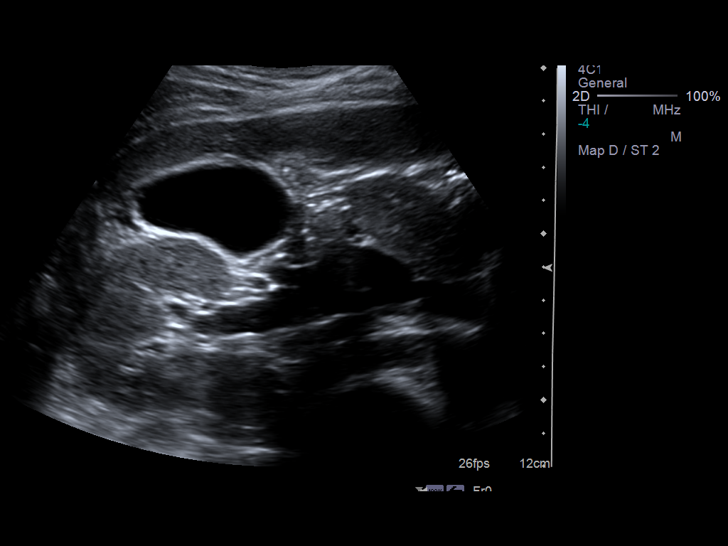
[im 58/77]
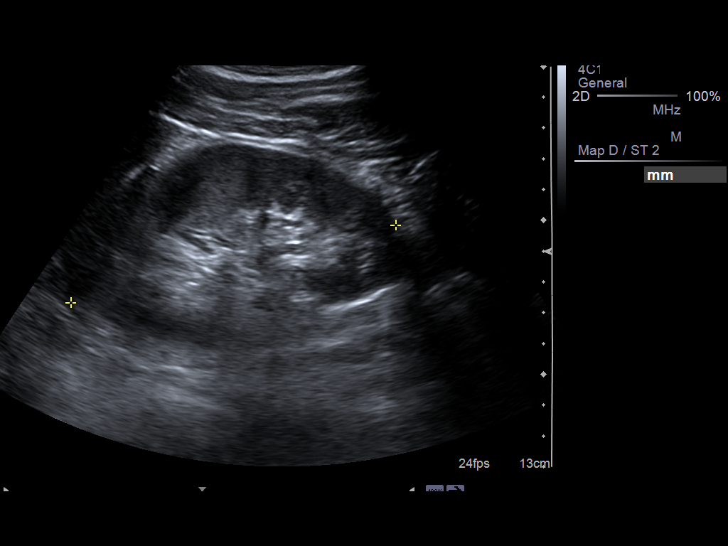
[im 64/77]
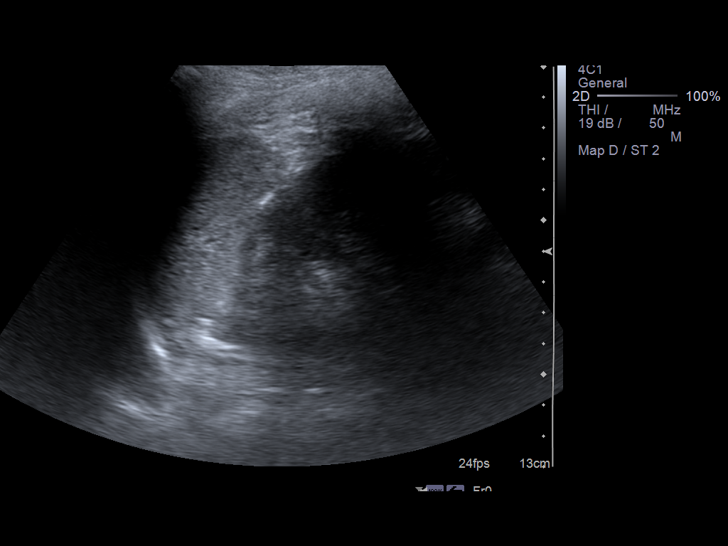
[im 70/77]
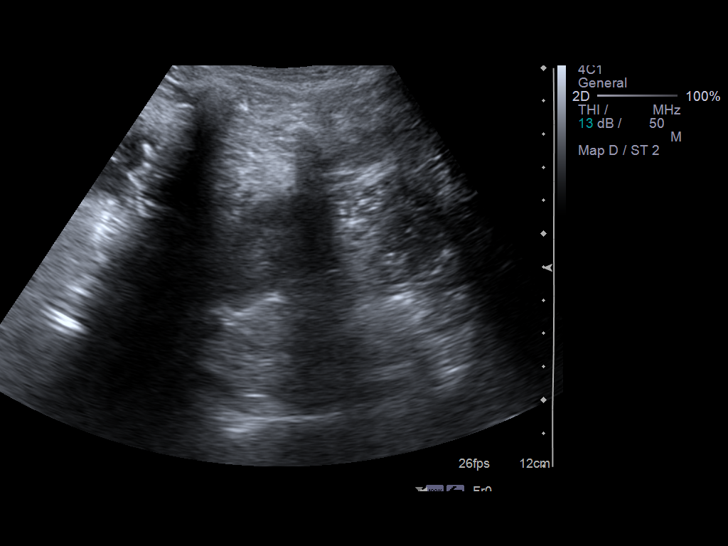
[im 77/77]
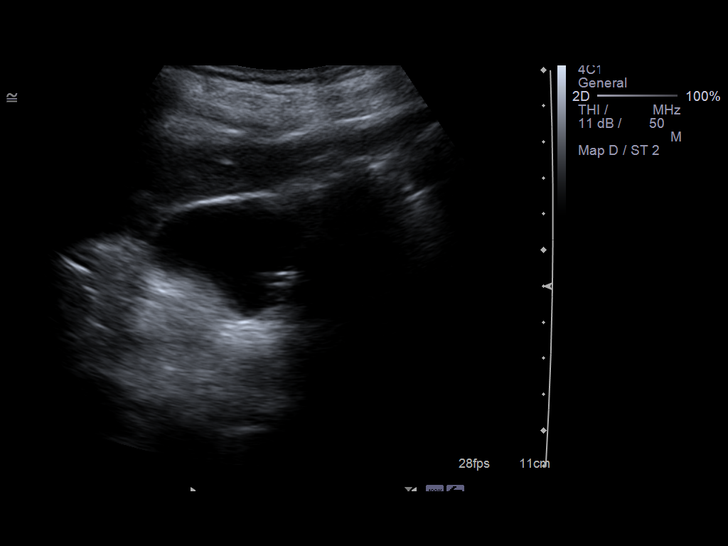

[14 of 25 positions shown; findings below may reference images not displayed]

FINDINGS: Gallbladder:  Gallstones.  Mild gallbladder wall thickening
measuring 3.1 mm.  Small amount of pericholecystic fluid.  In the
proper clinical setting, this may represent changes of
cholecystitis.

Common bile duct:  2.3 mm.

Liver:  15 cm in length. No focal hepatic lesion or intrahepatic
biliary duct dilatation.

IVC:  Limit evaluation.

Pancreas:  Small portions not visualized secondary to bowel gas.
Portions visualized unremarkable.

Spleen:  5.6 cm.  No focal mass.

Right Kidney:  11.2 cm. No hydronephrosis or renal mass.

Left Kidney:  10.8 cm. No hydronephrosis or renal mass.

Abdominal aorta:  Maximal AP dimension 2.4 cm.
IMPRESSION: Gallstones.  Mild gallbladder wall thickening measuring 3.1 mm.
Small amount of pericholecystic fluid.  In the proper clinical
setting, this may represent changes of cholecystitis.  Given the CT
findings, it is possible that the gallbladder findings are
incidental.

## 2011-09-28 ENCOUNTER — Emergency Department (HOSPITAL_COMMUNITY): Payer: Self-pay

## 2011-09-28 ENCOUNTER — Emergency Department (HOSPITAL_COMMUNITY)
Admission: EM | Admit: 2011-09-28 | Discharge: 2011-09-28 | Disposition: A | Discharge status: Home / Self Care | Payer: Self-pay | Attending: Emergency Medicine | Admitting: Emergency Medicine

## 2011-09-28 DIAGNOSIS — R110 Nausea: Secondary | ICD-10-CM | POA: Insufficient documentation

## 2011-09-28 DIAGNOSIS — R10819 Abdominal tenderness, unspecified site: Secondary | ICD-10-CM | POA: Insufficient documentation

## 2011-09-28 DIAGNOSIS — Z79899 Other long term (current) drug therapy: Secondary | ICD-10-CM | POA: Insufficient documentation

## 2011-09-28 DIAGNOSIS — IMO0002 Reserved for concepts with insufficient information to code with codable children: Secondary | ICD-10-CM | POA: Insufficient documentation

## 2011-09-28 DIAGNOSIS — R109 Unspecified abdominal pain: Secondary | ICD-10-CM | POA: Insufficient documentation

## 2011-09-28 LAB — CBC
HCT: 42.8 % (ref 39.0–52.0)
Hemoglobin: 15.1 g/dL (ref 13.0–17.0)
MCH: 31.5 pg (ref 26.0–34.0)
MCHC: 35.3 g/dL (ref 30.0–36.0)
MCV: 89.2 fL (ref 78.0–100.0)
Platelets: 142 10*3/uL — ABNORMAL LOW (ref 150–400)
RBC: 4.8 MIL/uL (ref 4.22–5.81)
RDW: 13.2 % (ref 11.5–15.5)
WBC: 8.3 10*3/uL (ref 4.0–10.5)

## 2011-09-28 LAB — COMPREHENSIVE METABOLIC PANEL
ALT: 24 U/L (ref 0–53)
AST: 27 U/L (ref 0–37)
Albumin: 3.9 g/dL (ref 3.5–5.2)
Alkaline Phosphatase: 53 U/L (ref 39–117)
BUN: 11 mg/dL (ref 6–23)
CO2: 20 mEq/L (ref 19–32)
Calcium: 10 mg/dL (ref 8.4–10.5)
Chloride: 107 mEq/L (ref 96–112)
Creatinine, Ser: 1.03 mg/dL (ref 0.50–1.35)
GFR calc Af Amer: >90 mL/min (ref >90)
GFR calc non Af Amer: 85 mL/min — ABNORMAL LOW (ref >90)
Glucose, Bld: 95 mg/dL (ref 70–99)
Potassium: 4.8 mEq/L (ref 3.5–5.1)
Sodium: 140 mEq/L (ref 135–145)
Total Bilirubin: 0.5 mg/dL (ref 0.3–1.2)
Total Protein: 7.2 g/dL (ref 6.0–8.3)

## 2011-09-28 LAB — DIFFERENTIAL
Basophils Absolute: 0.1 10*3/uL (ref 0.0–0.1)
Basophils Relative: 1 % (ref 0–1)
Eosinophils Absolute: 0.1 10*3/uL (ref 0.0–0.7)
Eosinophils Relative: 1 % (ref 0–5)
Lymphocytes Relative: 34 % (ref 12–46)
Lymphs Abs: 2.8 10*3/uL (ref 0.7–4.0)
Monocytes Absolute: 0.6 10*3/uL (ref 0.1–1.0)
Monocytes Relative: 7 % (ref 3–12)
Neutro Abs: 4.8 10*3/uL (ref 1.7–7.7)
Neutrophils Relative %: 58 % (ref 43–77)

## 2011-09-28 LAB — LIPASE, BLOOD: Lipase: 39 U/L (ref 11–59)

## 2011-09-28 IMAGING — NM NM LIVER FUNCTION STUDY
3 series · 13 of 13 positions shown · non-contrast
Comparison: None.

CLINICAL DATA: Abdominal pain

NUCLEAR MEDICINE HEPATOBILIARY IMAGING
TECHNIQUE: Sequential images of the abdomen were obtained [DATE] minutes following intravenous administration of
radiopharmaceutical.
Radiopharmaceutical:  5.0 mCi Sc-KKm Choletec

[he hepatobiliary · 3.43mm/px · 6 of 49 frames shown (1 of 3)]
[frame 5/49]
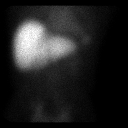
[frame 13/49]
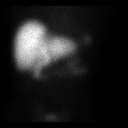
[frame 21/49]
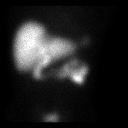
[frame 29/49]
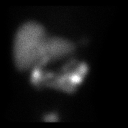
[frame 37/49]
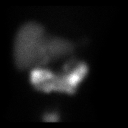
[frame 45/49]
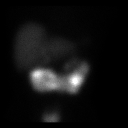

[he hepatobiliary · 3.43mm/px · 6 of 28 frames shown (2 of 3)]
[frame 3/28]
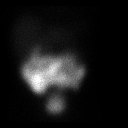
[frame 7/28]
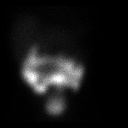
[frame 12/28]
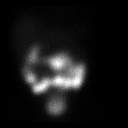
[frame 17/28]
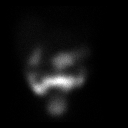
[frame 21/28]
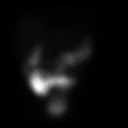
[frame 26/28]
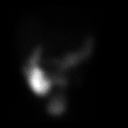

[he hepatobiliary · 1 of 1 slices shown (3 of 3)]
[im 1/1]
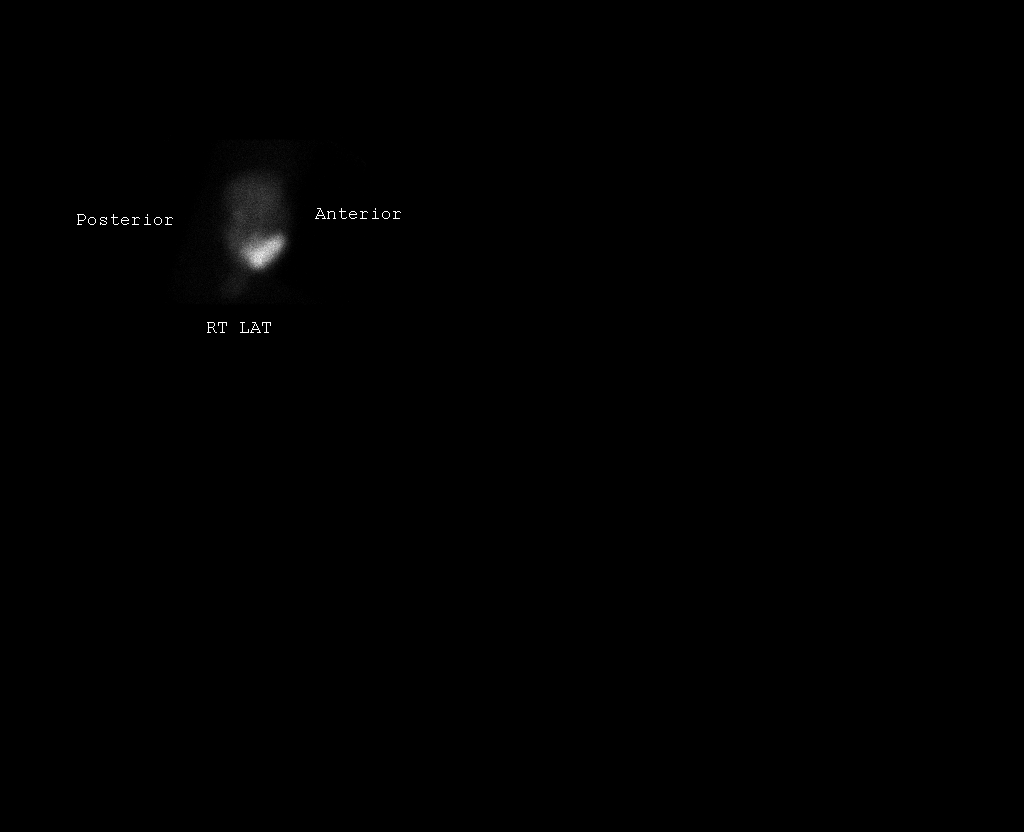

[13 of 13 positions shown; findings below may reference images not displayed]

FINDINGS: Imaging was performed over the first hour without
radioactivity in the gallbladder.  There is uniform uptake
throughout the liver as well as small bowel activity after 20
minutes.

After injecting 3.0 mg of morphine sulfate, subsequent imaging
demonstrates persistent activity throughout bowel.  There is an
oblong area of activity projecting inferior to the liver, near the
porta hepatis and is felt to represent bowel.
IMPRESSION: Findings are compatible with cystic duct obstruction.

## 2011-09-29 ENCOUNTER — Emergency Department (HOSPITAL_COMMUNITY)
Admission: EM | Admit: 2011-09-29 | Discharge: 2011-09-29 | Disposition: A | Discharge status: Home / Self Care | Payer: Self-pay | Attending: Emergency Medicine | Admitting: Emergency Medicine

## 2011-09-29 DIAGNOSIS — G8929 Other chronic pain: Secondary | ICD-10-CM | POA: Insufficient documentation

## 2011-09-29 DIAGNOSIS — R1011 Right upper quadrant pain: Secondary | ICD-10-CM | POA: Insufficient documentation

## 2011-10-03 ENCOUNTER — Encounter (INDEPENDENT_AMBULATORY_CARE_PROVIDER_SITE_OTHER): Payer: Self-pay | Admitting: Surgery

## 2011-10-03 ENCOUNTER — Emergency Department (HOSPITAL_COMMUNITY)
Admission: EM | Admit: 2011-10-03 | Discharge: 2011-10-04 | Disposition: A | Discharge status: Home / Self Care | Payer: Self-pay | Attending: Emergency Medicine | Admitting: Emergency Medicine

## 2011-10-03 DIAGNOSIS — R109 Unspecified abdominal pain: Secondary | ICD-10-CM | POA: Insufficient documentation

## 2011-10-03 DIAGNOSIS — R112 Nausea with vomiting, unspecified: Secondary | ICD-10-CM | POA: Insufficient documentation

## 2011-10-03 DIAGNOSIS — Z79899 Other long term (current) drug therapy: Secondary | ICD-10-CM | POA: Insufficient documentation

## 2011-10-03 DIAGNOSIS — Z9889 Other specified postprocedural states: Secondary | ICD-10-CM | POA: Insufficient documentation

## 2011-10-03 LAB — URINALYSIS, ROUTINE W REFLEX MICROSCOPIC
Glucose, UA: NEGATIVE mg/dL
Hgb urine dipstick: NEGATIVE
Ketones, ur: 15 mg/dL — AB
Leukocytes, UA: NEGATIVE
Nitrite: NEGATIVE
Protein, ur: NEGATIVE mg/dL
Specific Gravity, Urine: 1.036 — ABNORMAL HIGH (ref 1.005–1.030)
Urobilinogen, UA: 1 mg/dL (ref 0.0–1.0)
pH: 5.5 (ref 5.0–8.0)

## 2011-10-04 ENCOUNTER — Encounter (HOSPITAL_COMMUNITY): Payer: Self-pay | Admitting: Radiology

## 2011-10-04 ENCOUNTER — Emergency Department (HOSPITAL_COMMUNITY): Payer: Self-pay

## 2011-10-04 LAB — DIFFERENTIAL
Basophils Absolute: 0 10*3/uL (ref 0.0–0.1)
Basophils Relative: 0 % (ref 0–1)
Eosinophils Absolute: 0 10*3/uL (ref 0.0–0.7)
Eosinophils Relative: 0 % (ref 0–5)
Lymphocytes Relative: 19 % (ref 12–46)
Lymphs Abs: 1.9 10*3/uL (ref 0.7–4.0)
Monocytes Absolute: 0.6 10*3/uL (ref 0.1–1.0)
Monocytes Relative: 6 % (ref 3–12)
Neutro Abs: 7.5 10*3/uL (ref 1.7–7.7)
Neutrophils Relative %: 75 % (ref 43–77)

## 2011-10-04 LAB — CBC
HCT: 40.4 % (ref 39.0–52.0)
Hemoglobin: 13.9 g/dL (ref 13.0–17.0)
MCH: 31.4 pg (ref 26.0–34.0)
MCHC: 34.4 g/dL (ref 30.0–36.0)
MCV: 91.2 fL (ref 78.0–100.0)
Platelets: 205 10*3/uL (ref 150–400)
RBC: 4.43 MIL/uL (ref 4.22–5.81)
RDW: 12.9 % (ref 11.5–15.5)
WBC: 10.1 10*3/uL (ref 4.0–10.5)

## 2011-10-04 LAB — COMPREHENSIVE METABOLIC PANEL
ALT: 20 U/L (ref 0–53)
AST: 25 U/L (ref 0–37)
Albumin: 4.3 g/dL (ref 3.5–5.2)
Alkaline Phosphatase: 54 U/L (ref 39–117)
BUN: 10 mg/dL (ref 6–23)
CO2: 26 mEq/L (ref 19–32)
Calcium: 9.7 mg/dL (ref 8.4–10.5)
Chloride: 102 mEq/L (ref 96–112)
Creatinine, Ser: 1.06 mg/dL (ref 0.50–1.35)
GFR calc Af Amer: >90 mL/min (ref >90)
GFR calc non Af Amer: 82 mL/min — ABNORMAL LOW (ref >90)
Glucose, Bld: 136 mg/dL — ABNORMAL HIGH (ref 70–99)
Potassium: 4.1 mEq/L (ref 3.5–5.1)
Sodium: 139 mEq/L (ref 135–145)
Total Bilirubin: 0.3 mg/dL (ref 0.3–1.2)
Total Protein: 7.3 g/dL (ref 6.0–8.3)

## 2011-10-04 LAB — LIPASE, BLOOD: Lipase: 42 U/L (ref 11–59)
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
## 2011-10-09 ENCOUNTER — Encounter (INDEPENDENT_AMBULATORY_CARE_PROVIDER_SITE_OTHER): Payer: Self-pay | Admitting: Surgery

## 2011-10-09 ENCOUNTER — Ambulatory Visit (INDEPENDENT_AMBULATORY_CARE_PROVIDER_SITE_OTHER): Payer: PRIVATE HEALTH INSURANCE | Admitting: Surgery

## 2011-10-09 DIAGNOSIS — R1011 Right upper quadrant pain: Secondary | ICD-10-CM

## 2011-10-09 DIAGNOSIS — R112 Nausea with vomiting, unspecified: Secondary | ICD-10-CM | POA: Insufficient documentation
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
## 2011-10-20 ENCOUNTER — Ambulatory Visit
Admission: RE | Admit: 2011-10-20 | Discharge: 2011-10-20 | Disposition: A | Discharge status: Home / Self Care | Payer: Self-pay | Source: Ambulatory Visit | Attending: Surgery | Admitting: Surgery

## 2011-10-27 ENCOUNTER — Emergency Department (HOSPITAL_COMMUNITY): Payer: Self-pay

## 2011-10-27 ENCOUNTER — Emergency Department (HOSPITAL_COMMUNITY)
Admission: EM | Admit: 2011-10-27 | Discharge: 2011-10-27 | Disposition: A | Discharge status: Home / Self Care | Payer: Self-pay | Attending: Emergency Medicine | Admitting: Emergency Medicine

## 2011-10-27 ENCOUNTER — Encounter (HOSPITAL_COMMUNITY): Payer: Self-pay | Admitting: *Deleted

## 2011-10-27 DIAGNOSIS — R1110 Vomiting, unspecified: Secondary | ICD-10-CM | POA: Insufficient documentation

## 2011-10-27 DIAGNOSIS — Z79899 Other long term (current) drug therapy: Secondary | ICD-10-CM | POA: Insufficient documentation

## 2011-10-27 DIAGNOSIS — F17200 Nicotine dependence, unspecified, uncomplicated: Secondary | ICD-10-CM | POA: Insufficient documentation

## 2011-10-27 DIAGNOSIS — G8929 Other chronic pain: Secondary | ICD-10-CM | POA: Insufficient documentation

## 2011-10-27 DIAGNOSIS — Z9889 Other specified postprocedural states: Secondary | ICD-10-CM | POA: Insufficient documentation

## 2011-10-27 DIAGNOSIS — R1011 Right upper quadrant pain: Secondary | ICD-10-CM | POA: Insufficient documentation

## 2011-10-27 DIAGNOSIS — Z87828 Personal history of other (healed) physical injury and trauma: Secondary | ICD-10-CM | POA: Insufficient documentation

## 2011-10-27 DIAGNOSIS — R10817 Generalized abdominal tenderness: Secondary | ICD-10-CM | POA: Insufficient documentation

## 2011-10-27 LAB — DIFFERENTIAL
Basophils Absolute: 0 10*3/uL (ref 0.0–0.1)
Basophils Relative: 0 % (ref 0–1)
Eosinophils Absolute: 0.1 10*3/uL (ref 0.0–0.7)
Eosinophils Relative: 2 % (ref 0–5)
Lymphocytes Relative: 28 % (ref 12–46)
Lymphs Abs: 2.1 10*3/uL (ref 0.7–4.0)
Monocytes Absolute: 0.6 10*3/uL (ref 0.1–1.0)
Monocytes Relative: 8 % (ref 3–12)
Neutro Abs: 4.6 10*3/uL (ref 1.7–7.7)
Neutrophils Relative %: 62 % (ref 43–77)

## 2011-10-27 LAB — COMPREHENSIVE METABOLIC PANEL
ALT: 40 U/L (ref 0–53)
AST: 25 U/L (ref 0–37)
Albumin: 3.8 g/dL (ref 3.5–5.2)
Alkaline Phosphatase: 73 U/L (ref 39–117)
BUN: 9 mg/dL (ref 6–23)
CO2: 21 mEq/L (ref 19–32)
Calcium: 9.2 mg/dL (ref 8.4–10.5)
Chloride: 107 mEq/L (ref 96–112)
Creatinine, Ser: 0.84 mg/dL (ref 0.50–1.35)
GFR calc Af Amer: >90 mL/min (ref >90)
GFR calc non Af Amer: >90 mL/min (ref >90)
Glucose, Bld: 97 mg/dL (ref 70–99)
Potassium: 4.2 mEq/L (ref 3.5–5.1)
Sodium: 138 mEq/L (ref 135–145)
Total Bilirubin: 0.3 mg/dL (ref 0.3–1.2)
Total Protein: 7.6 g/dL (ref 6.0–8.3)

## 2011-10-27 LAB — CBC
HCT: 42.3 % (ref 39.0–52.0)
Hemoglobin: 14.7 g/dL (ref 13.0–17.0)
MCH: 31.5 pg (ref 26.0–34.0)
MCHC: 34.8 g/dL (ref 30.0–36.0)
MCV: 90.6 fL (ref 78.0–100.0)
Platelets: 216 10*3/uL (ref 150–400)
RBC: 4.67 MIL/uL (ref 4.22–5.81)
RDW: 13.3 % (ref 11.5–15.5)
WBC: 7.4 10*3/uL (ref 4.0–10.5)

## 2011-10-27 LAB — LIPASE, BLOOD: Lipase: 33 U/L (ref 11–59)

## 2011-10-27 MED ORDER — ONDANSETRON HCL 4 MG/2ML IJ SOLN
4.0000 mg | Freq: Once | INTRAMUSCULAR | Status: AC
  Administered 2011-10-27: 4 mg via INTRAVENOUS
  Filled 2011-10-27: qty 2

## 2011-10-27 MED ORDER — LORAZEPAM 2 MG/ML IJ SOLN
2.0000 mg | Freq: Once | INTRAMUSCULAR | Status: AC
  Administered 2011-10-27: 2 mg via INTRAVENOUS
  Filled 2011-10-27: qty 1

## 2011-10-27 MED ORDER — SODIUM CHLORIDE 0.9 % IV BOLUS (SEPSIS)
1000.0000 mL | Freq: Once | INTRAVENOUS | Status: AC
  Administered 2011-10-27: 1000 mL via INTRAVENOUS

## 2011-10-27 MED ORDER — GI COCKTAIL ~~LOC~~
30.0000 mL | Freq: Once | ORAL | Status: AC
  Administered 2011-10-27: 30 mL via ORAL
  Filled 2011-10-27: qty 30

## 2011-10-27 MED ORDER — FENTANYL CITRATE 0.05 MG/ML IJ SOLN
INTRAMUSCULAR | Status: AC
  Administered 2011-10-27: 100 ug
  Filled 2011-10-27: qty 2

## 2011-10-27 MED ORDER — HYDROMORPHONE HCL PF 1 MG/ML IJ SOLN
1.0000 mg | Freq: Once | INTRAMUSCULAR | Status: AC
  Administered 2011-10-27: 1 mg via INTRAVENOUS
  Filled 2011-10-27: qty 1

## 2011-10-27 MED ORDER — HYDROMORPHONE HCL PF 1 MG/ML IJ SOLN
INTRAMUSCULAR | Status: AC
  Filled 2011-10-27: qty 1

## 2011-10-27 MED ORDER — ONDANSETRON HCL 4 MG/2ML IJ SOLN
INTRAMUSCULAR | Status: AC
  Administered 2011-10-27: 4 mg via INTRAVENOUS
  Filled 2011-10-27: qty 2
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
## 2011-10-27 NOTE — ED Notes (Signed)
Pt presents with continued complaints of abdominal pain. He has been here multiple times for the same complaint and states that he has another follow up appointment with gi dr on 12-10. Upon arrival pt is screaming and rolling around and is lying on the floor. Iv started and protocols initiated. Pt is alert and oriented. He stops screaming when nursing is out of view but when in eyesight or near the bedside the screaming and rolling around continue.

## 2011-10-27 NOTE — ED Notes (Signed)
Patient reports onset of abd pain,  He states he took his shower and then had onset of increasing abd pain.  Patient has pain in his mid abd,  Right to mid abd

## 2011-11-01 ENCOUNTER — Emergency Department (HOSPITAL_COMMUNITY)
Admission: EM | Admit: 2011-11-01 | Discharge: 2011-11-02 | Disposition: A | Discharge status: Home / Self Care | Payer: Self-pay | Attending: Emergency Medicine | Admitting: Emergency Medicine

## 2011-11-01 ENCOUNTER — Encounter (HOSPITAL_COMMUNITY): Payer: Self-pay | Admitting: *Deleted

## 2011-11-01 DIAGNOSIS — R112 Nausea with vomiting, unspecified: Secondary | ICD-10-CM | POA: Insufficient documentation

## 2011-11-01 DIAGNOSIS — R109 Unspecified abdominal pain: Secondary | ICD-10-CM | POA: Insufficient documentation

## 2011-11-01 MED ORDER — ONDANSETRON HCL 4 MG/2ML IJ SOLN
4.0000 mg | Freq: Once | INTRAMUSCULAR | Status: AC
  Administered 2011-11-01: 4 mg via INTRAVENOUS
  Filled 2011-11-01: qty 2

## 2011-11-01 MED ORDER — HYDROMORPHONE HCL PF 1 MG/ML IJ SOLN
1.0000 mg | Freq: Once | INTRAMUSCULAR | Status: AC
  Administered 2011-11-01: 1 mg via INTRAVENOUS
  Filled 2011-11-01: qty 1

## 2011-11-01 MED ORDER — SODIUM CHLORIDE 0.9 % IV BOLUS (SEPSIS)
1000.0000 mL | Freq: Once | INTRAVENOUS | Status: AC
  Administered 2011-11-01: 1000 mL via INTRAVENOUS
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
## 2011-11-01 NOTE — ED Notes (Signed)
Pt has had abdominal pain for one year.  Last November he had his gallbladder taken out and has had severe abdominal pain since.  Pt states that the pain has been worse than normal for the last 3-4 hours.  Pt has had n/v with this.

## 2011-11-01 NOTE — ED Notes (Signed)
acutity changed to 2 due to pt severe pain

## 2011-11-03 ENCOUNTER — Emergency Department (HOSPITAL_COMMUNITY): Payer: Self-pay

## 2011-11-03 ENCOUNTER — Encounter (HOSPITAL_COMMUNITY): Payer: Self-pay | Admitting: Emergency Medicine

## 2011-11-03 ENCOUNTER — Emergency Department (HOSPITAL_COMMUNITY)
Admission: EM | Admit: 2011-11-03 | Discharge: 2011-11-03 | Disposition: A | Discharge status: Home / Self Care | Payer: Self-pay | Attending: Emergency Medicine | Admitting: Emergency Medicine

## 2011-11-03 ENCOUNTER — Telehealth (HOSPITAL_COMMUNITY): Payer: Self-pay | Admitting: Emergency Medicine

## 2011-11-03 DIAGNOSIS — Z79899 Other long term (current) drug therapy: Secondary | ICD-10-CM | POA: Insufficient documentation

## 2011-11-03 DIAGNOSIS — R109 Unspecified abdominal pain: Secondary | ICD-10-CM | POA: Insufficient documentation

## 2011-11-03 DIAGNOSIS — N390 Urinary tract infection, site not specified: Secondary | ICD-10-CM | POA: Insufficient documentation

## 2011-11-03 DIAGNOSIS — G8929 Other chronic pain: Secondary | ICD-10-CM | POA: Insufficient documentation

## 2011-11-03 DIAGNOSIS — R112 Nausea with vomiting, unspecified: Secondary | ICD-10-CM | POA: Insufficient documentation

## 2011-11-03 DIAGNOSIS — R10819 Abdominal tenderness, unspecified site: Secondary | ICD-10-CM | POA: Insufficient documentation

## 2011-11-03 DIAGNOSIS — R300 Dysuria: Secondary | ICD-10-CM | POA: Insufficient documentation

## 2011-11-03 DIAGNOSIS — Z9889 Other specified postprocedural states: Secondary | ICD-10-CM | POA: Insufficient documentation

## 2011-11-03 LAB — COMPREHENSIVE METABOLIC PANEL
ALT: 27 U/L (ref 0–53)
AST: 22 U/L (ref 0–37)
Albumin: 3.6 g/dL (ref 3.5–5.2)
Alkaline Phosphatase: 63 U/L (ref 39–117)
BUN: 6 mg/dL (ref 6–23)
CO2: 25 mEq/L (ref 19–32)
Calcium: 8.7 mg/dL (ref 8.4–10.5)
Chloride: 104 mEq/L (ref 96–112)
Creatinine, Ser: 0.87 mg/dL (ref 0.50–1.35)
GFR calc Af Amer: >90 mL/min (ref >90)
GFR calc non Af Amer: >90 mL/min (ref >90)
Glucose, Bld: 106 mg/dL — ABNORMAL HIGH (ref 70–99)
Potassium: 3.6 mEq/L (ref 3.5–5.1)
Sodium: 139 mEq/L (ref 135–145)
Total Bilirubin: 0.3 mg/dL (ref 0.3–1.2)
Total Protein: 6.9 g/dL (ref 6.0–8.3)

## 2011-11-03 LAB — DIFFERENTIAL
Basophils Absolute: 0 10*3/uL (ref 0.0–0.1)
Basophils Relative: 0 % (ref 0–1)
Eosinophils Absolute: 0.1 10*3/uL (ref 0.0–0.7)
Eosinophils Relative: 1 % (ref 0–5)
Lymphocytes Relative: 33 % (ref 12–46)
Lymphs Abs: 2.8 10*3/uL (ref 0.7–4.0)
Monocytes Absolute: 0.7 10*3/uL (ref 0.1–1.0)
Monocytes Relative: 8 % (ref 3–12)
Neutro Abs: 5 10*3/uL (ref 1.7–7.7)
Neutrophils Relative %: 58 % (ref 43–77)

## 2011-11-03 LAB — CBC
HCT: 38.7 % — ABNORMAL LOW (ref 39.0–52.0)
Hemoglobin: 13.3 g/dL (ref 13.0–17.0)
MCH: 30.9 pg (ref 26.0–34.0)
MCHC: 34.4 g/dL (ref 30.0–36.0)
MCV: 90 fL (ref 78.0–100.0)
Platelets: 210 10*3/uL (ref 150–400)
RBC: 4.3 MIL/uL (ref 4.22–5.81)
RDW: 13.2 % (ref 11.5–15.5)
WBC: 8.5 10*3/uL (ref 4.0–10.5)

## 2011-11-03 LAB — URINE MICROSCOPIC-ADD ON

## 2011-11-03 LAB — URINALYSIS, ROUTINE W REFLEX MICROSCOPIC
Glucose, UA: NEGATIVE mg/dL
Hgb urine dipstick: NEGATIVE
Ketones, ur: 15 mg/dL — AB
Nitrite: POSITIVE — AB
Protein, ur: NEGATIVE mg/dL
Specific Gravity, Urine: 1.035 — ABNORMAL HIGH (ref 1.005–1.030)
Urobilinogen, UA: 1 mg/dL (ref 0.0–1.0)
pH: 5.5 (ref 5.0–8.0)

## 2011-11-03 LAB — LIPASE, BLOOD: Lipase: 20 U/L (ref 11–59)

## 2011-11-03 MED ORDER — FENTANYL CITRATE 0.05 MG/ML IJ SOLN
100.0000 ug | Freq: Once | INTRAMUSCULAR | Status: AC
  Administered 2011-11-03: 100 ug via INTRAVENOUS
  Filled 2011-11-03: qty 2

## 2011-11-03 MED ORDER — IOHEXOL 300 MG/ML  SOLN
100.0000 mL | Freq: Once | INTRAMUSCULAR | Status: AC | PRN
  Administered 2011-11-03: 100 mL via INTRAVENOUS

## 2011-11-03 MED ORDER — SODIUM CHLORIDE 0.9 % IV BOLUS (SEPSIS)
1000.0000 mL | Freq: Once | INTRAVENOUS | Status: AC
  Administered 2011-11-03: 1000 mL via INTRAVENOUS

## 2011-11-03 MED ORDER — AZITHROMYCIN 250 MG PO TABS
1000.0000 mg | ORAL_TABLET | Freq: Once | ORAL | Status: AC
  Administered 2011-11-03: 1000 mg via ORAL
  Filled 2011-11-03: qty 4

## 2011-11-03 MED ORDER — CIPROFLOXACIN HCL 500 MG PO TABS: 500.0000 mg | ORAL_TABLET | Freq: Two times a day (BID) | ORAL | Status: DC

## 2011-11-03 MED ORDER — ONDANSETRON HCL 4 MG/2ML IJ SOLN
4.0000 mg | Freq: Once | INTRAMUSCULAR | Status: AC
  Administered 2011-11-03: 4 mg via INTRAVENOUS
  Filled 2011-11-03: qty 2

## 2011-11-03 MED ORDER — CEFTRIAXONE SODIUM 250 MG IJ SOLR
250.0000 mg | Freq: Once | INTRAMUSCULAR | Status: AC
  Administered 2011-11-03: 250 mg via INTRAMUSCULAR
  Filled 2011-11-03: qty 250

## 2011-11-03 MED ORDER — AZITHROMYCIN 1 G PO PACK: 1.0000 g | PACK | Freq: Once | ORAL | Status: DC

## 2011-11-03 NOTE — ED Notes (Signed)
Ct called and aware has completed oral contrast.
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
## 2011-11-03 NOTE — ED Notes (Signed)
States his surgeon gave him dilaudid for pain but ran out, states has follow up appointment with Dr Magnus Ivan on 11/13/11

## 2011-11-03 NOTE — ED Provider Notes (Signed)
Medical screening examination/treatment/procedure(s) were performed by non-physician practitioner and as supervising physician I was immediately available for consultation/collaboration. Devoria Albe, MD, Armando Gang   Ward Givens, MD 11/03/11 [phone]

## 2011-11-03 NOTE — ED Provider Notes (Signed)
Medical screening examination/treatment/procedure(s) were performed by non-physician practitioner and as supervising physician I was immediately available for consultation/collaboration. Devoria Albe, MD, Armando Gang   Ward Givens, MD 11/03/11 1901
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
## 2011-11-03 NOTE — ED Notes (Signed)
Pt here c/o right upper abd pain starting last night; pt denies N/V/D; pt sts painful to take deep breath; pt sts pain near where had gall bladder out

## 2011-11-03 NOTE — ED Notes (Signed)
Pt states had a bowel movement and pain returned.

## 2011-11-03 NOTE — ED Notes (Signed)
Ct called again about wait time. States will get him "soon", pt aware.

## 2011-11-04 LAB — URINE CULTURE
Colony Count: NO GROWTH
Culture  Setup Time: 201211301635
Culture: NO GROWTH

## 2011-11-05 ENCOUNTER — Encounter (HOSPITAL_COMMUNITY): Payer: Self-pay | Admitting: *Deleted

## 2011-11-05 ENCOUNTER — Emergency Department (HOSPITAL_COMMUNITY)
Admission: EM | Admit: 2011-11-05 | Discharge: 2011-11-05 | Disposition: A | Discharge status: Home / Self Care | Payer: Self-pay

## 2011-11-05 ENCOUNTER — Emergency Department (HOSPITAL_COMMUNITY)
Admission: EM | Admit: 2011-11-05 | Discharge: 2011-11-05 | Disposition: A | Discharge status: Home / Self Care | Payer: Self-pay | Attending: Emergency Medicine | Admitting: Emergency Medicine

## 2011-11-05 DIAGNOSIS — R109 Unspecified abdominal pain: Secondary | ICD-10-CM

## 2011-11-05 DIAGNOSIS — R1084 Generalized abdominal pain: Secondary | ICD-10-CM | POA: Insufficient documentation

## 2011-11-05 DIAGNOSIS — G8929 Other chronic pain: Secondary | ICD-10-CM | POA: Insufficient documentation

## 2011-11-05 MED ORDER — KETOROLAC TROMETHAMINE 60 MG/2ML IM SOLN
60.0000 mg | Freq: Once | INTRAMUSCULAR | Status: AC
  Administered 2011-11-05: 60 mg via INTRAMUSCULAR
  Filled 2011-11-05: qty 2

## 2011-11-05 NOTE — ED Notes (Signed)
Pt uncooperative on answering questions. Thrashing about in bed cussing at me and telling me to get him more pain medicine.ER Dr. And charge nurse came and talked with pt. Which calmed him to a small degree.

## 2011-11-05 NOTE — ED Notes (Signed)
Pt to ED c/o his chronic abd pain and emesis.  C/o diffuse epigastric pain.

## 2011-11-05 NOTE — ED Notes (Signed)
Pt still will not respond to anyone but male charge nurse and ER Dr. Catalina Pizza me that if he didn't get any stronger medicine that he would "check back in" and "want to see another doctor." Pt left alone to self sooth. Call light at bedside.

## 2011-11-05 NOTE — ED Notes (Signed)
Treating EDP aware of pt complaint . OK'd for discharge
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
## 2011-11-05 NOTE — ED Notes (Signed)
Pt reports being treated for same SX,s on 12-1. Pt returns because SX's are worse

## 2011-11-06 NOTE — ED Provider Notes (Signed)
  I reviewed the history and physical examination of Randal J Wrightsman and discussed/supervised his management with Dr. Zebedee Iba.  I agree with the history, physical, assessment, and plan of care, with the following exceptions: None  I was present for the following procedures: None Time Spent in Critical Care of the patient: None   Manus Rudd, MD 11/06/11 [phone]

## 2011-11-07 ENCOUNTER — Emergency Department (HOSPITAL_COMMUNITY)
Admission: EM | Admit: 2011-11-07 | Discharge: 2011-11-07 | Disposition: A | Discharge status: Home / Self Care | Payer: Self-pay | Attending: Emergency Medicine | Admitting: Emergency Medicine

## 2011-11-07 ENCOUNTER — Encounter (HOSPITAL_COMMUNITY): Payer: Self-pay

## 2011-11-07 DIAGNOSIS — R110 Nausea: Secondary | ICD-10-CM | POA: Insufficient documentation

## 2011-11-07 DIAGNOSIS — F17200 Nicotine dependence, unspecified, uncomplicated: Secondary | ICD-10-CM | POA: Insufficient documentation

## 2011-11-07 DIAGNOSIS — R52 Pain, unspecified: Secondary | ICD-10-CM | POA: Insufficient documentation

## 2011-11-07 DIAGNOSIS — Z9889 Other specified postprocedural states: Secondary | ICD-10-CM | POA: Insufficient documentation

## 2011-11-07 DIAGNOSIS — R10819 Abdominal tenderness, unspecified site: Secondary | ICD-10-CM | POA: Insufficient documentation

## 2011-11-07 DIAGNOSIS — R109 Unspecified abdominal pain: Secondary | ICD-10-CM | POA: Insufficient documentation

## 2011-11-07 DIAGNOSIS — Z87828 Personal history of other (healed) physical injury and trauma: Secondary | ICD-10-CM | POA: Insufficient documentation

## 2011-11-07 DIAGNOSIS — G8929 Other chronic pain: Secondary | ICD-10-CM | POA: Insufficient documentation

## 2011-11-07 MED ORDER — KETOROLAC TROMETHAMINE 60 MG/2ML IM SOLN
60.0000 mg | Freq: Once | INTRAMUSCULAR | Status: AC
  Administered 2011-11-07: 60 mg via INTRAMUSCULAR
  Filled 2011-11-07: qty 2

## 2011-11-07 MED ORDER — OXYCODONE-ACETAMINOPHEN 5-325 MG PO TABS
1.0000 | ORAL_TABLET | Freq: Once | ORAL | Status: AC
  Administered 2011-11-07: 1 via ORAL
  Filled 2011-11-07: qty 1

## 2011-11-07 MED ORDER — FENTANYL CITRATE 0.05 MG/ML IJ SOLN: 50.0000 ug | Freq: Once | INTRAMUSCULAR | Status: DC

## 2011-11-07 MED ORDER — ONDANSETRON 4 MG PO TBDP
4.0000 mg | ORAL_TABLET | Freq: Once | ORAL | Status: AC
  Administered 2011-11-07: 4 mg via ORAL
  Filled 2011-11-07: qty 1

## 2011-11-07 NOTE — ED Notes (Signed)
Pt c/o mid abd pain starting 0400 this am, pt sts he took one of his friends pain medication

## 2011-11-07 NOTE — ED Notes (Signed)
C/o n/v, RUQ pain since Saturday, seen here for same & discharged. Returns today for worsening pain this morning. Emesis x 2. Denies diarrhea. Last BM Sunday. Pt restless, rolling around, writhing in pain, yelling, moaning & groaning.
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
## 2011-11-11 NOTE — ED Provider Notes (Signed)
I saw and evaluated the patient, reviewed the resident's note and I agree with the findings and plan. Pt with chronic abd pain without peritonitis today.  Recently had labs and CT will have f/u with PCP.  Gwyneth Sprout, MD 11/11/11 [phone]

## 2011-11-13 ENCOUNTER — Encounter (INDEPENDENT_AMBULATORY_CARE_PROVIDER_SITE_OTHER): Payer: Self-pay | Admitting: Surgery

## 2011-11-13 ENCOUNTER — Ambulatory Visit (INDEPENDENT_AMBULATORY_CARE_PROVIDER_SITE_OTHER): Payer: PRIVATE HEALTH INSURANCE | Admitting: Surgery

## 2011-11-13 VITALS — BP 122/80 | HR 70 | Temp 98.6°F | Resp 16 | Ht 68.0 in | Wt 143.4 lb

## 2011-11-13 DIAGNOSIS — G8929 Other chronic pain: Secondary | ICD-10-CM

## 2011-11-13 DIAGNOSIS — R109 Unspecified abdominal pain: Secondary | ICD-10-CM

## 2011-11-13 NOTE — Progress Notes (Signed)
Subjective:     Patient ID: Leanora Cover, male   DOB: [birth year], 45 y.o.   MRN: [id number]  HPI He is here for another visit regarding his chronic abdominal pain. He still describes a floating in the right upper quadrant.  He has no nausea or vomiting. He has had multiple trips to the emergency department for this.  Review of Systems     Objective:   Physical Exam On exam, his abdomen is soft and nontender today. There are no hernias. He does have palpable sutures underneath the right-sided incision   His upper GI and small bowel follow-through was normal with normal transit and no stricturing or signs of obstruction. He has multiple CAT scans which are also normal.  Assessment:     Patient with chronic abdominal pain    Plan:     Given his lack of findings for obstruction on multiple films and his known large amount of adhesions, I am not recommending exploratory laparotomy. Again I remain uncertain of the etiology of his pain. Given his bloating, I will try him on Reglan to see if this will help. From a surgical standpoint, I have no further recommendations.,
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
## 2011-11-25 IMAGING — CR DG ABDOMEN 2V
2 series · 2 of 2 positions shown · non-contrast
Comparison: Plain film of 1 day prior.  CT of 12/01/2010.

CLINICAL DATA: Abdominal pain with nausea vomiting.  Ileus versus
partial small bowel obstruction.  Follow-up.

ABDOMEN - 2 VIEW

[w abdomen upright]
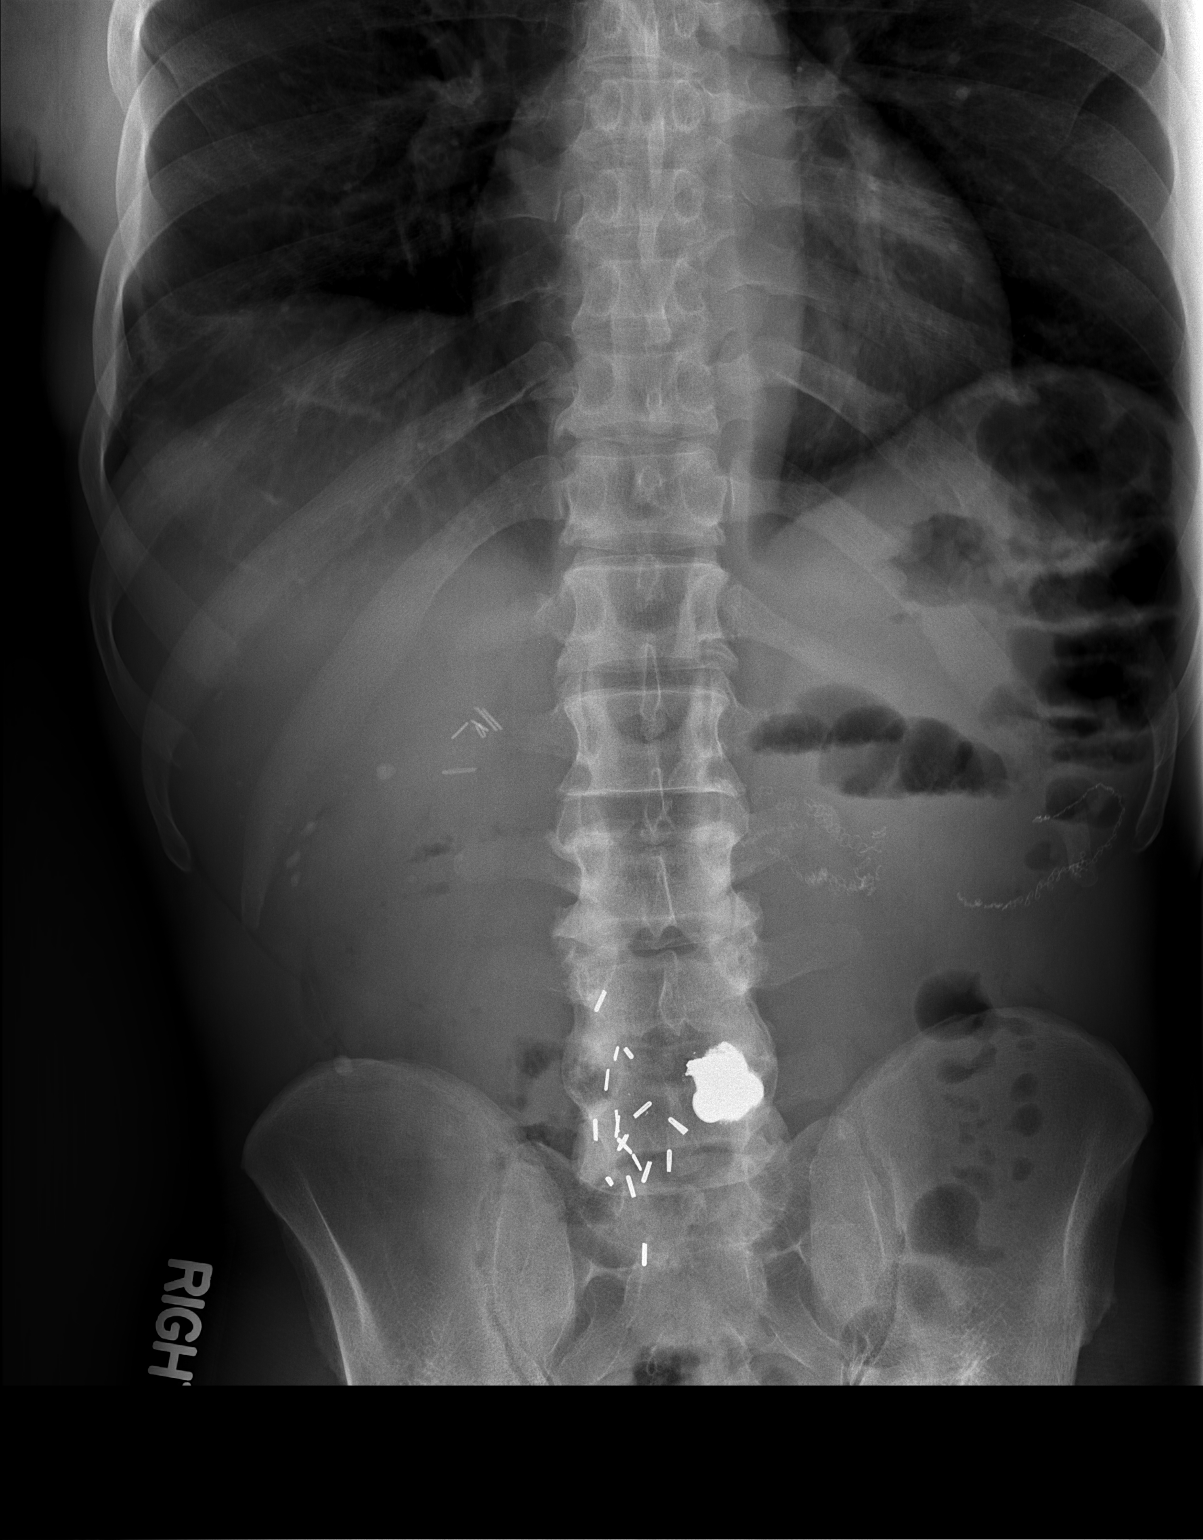

[t abdomen supine]
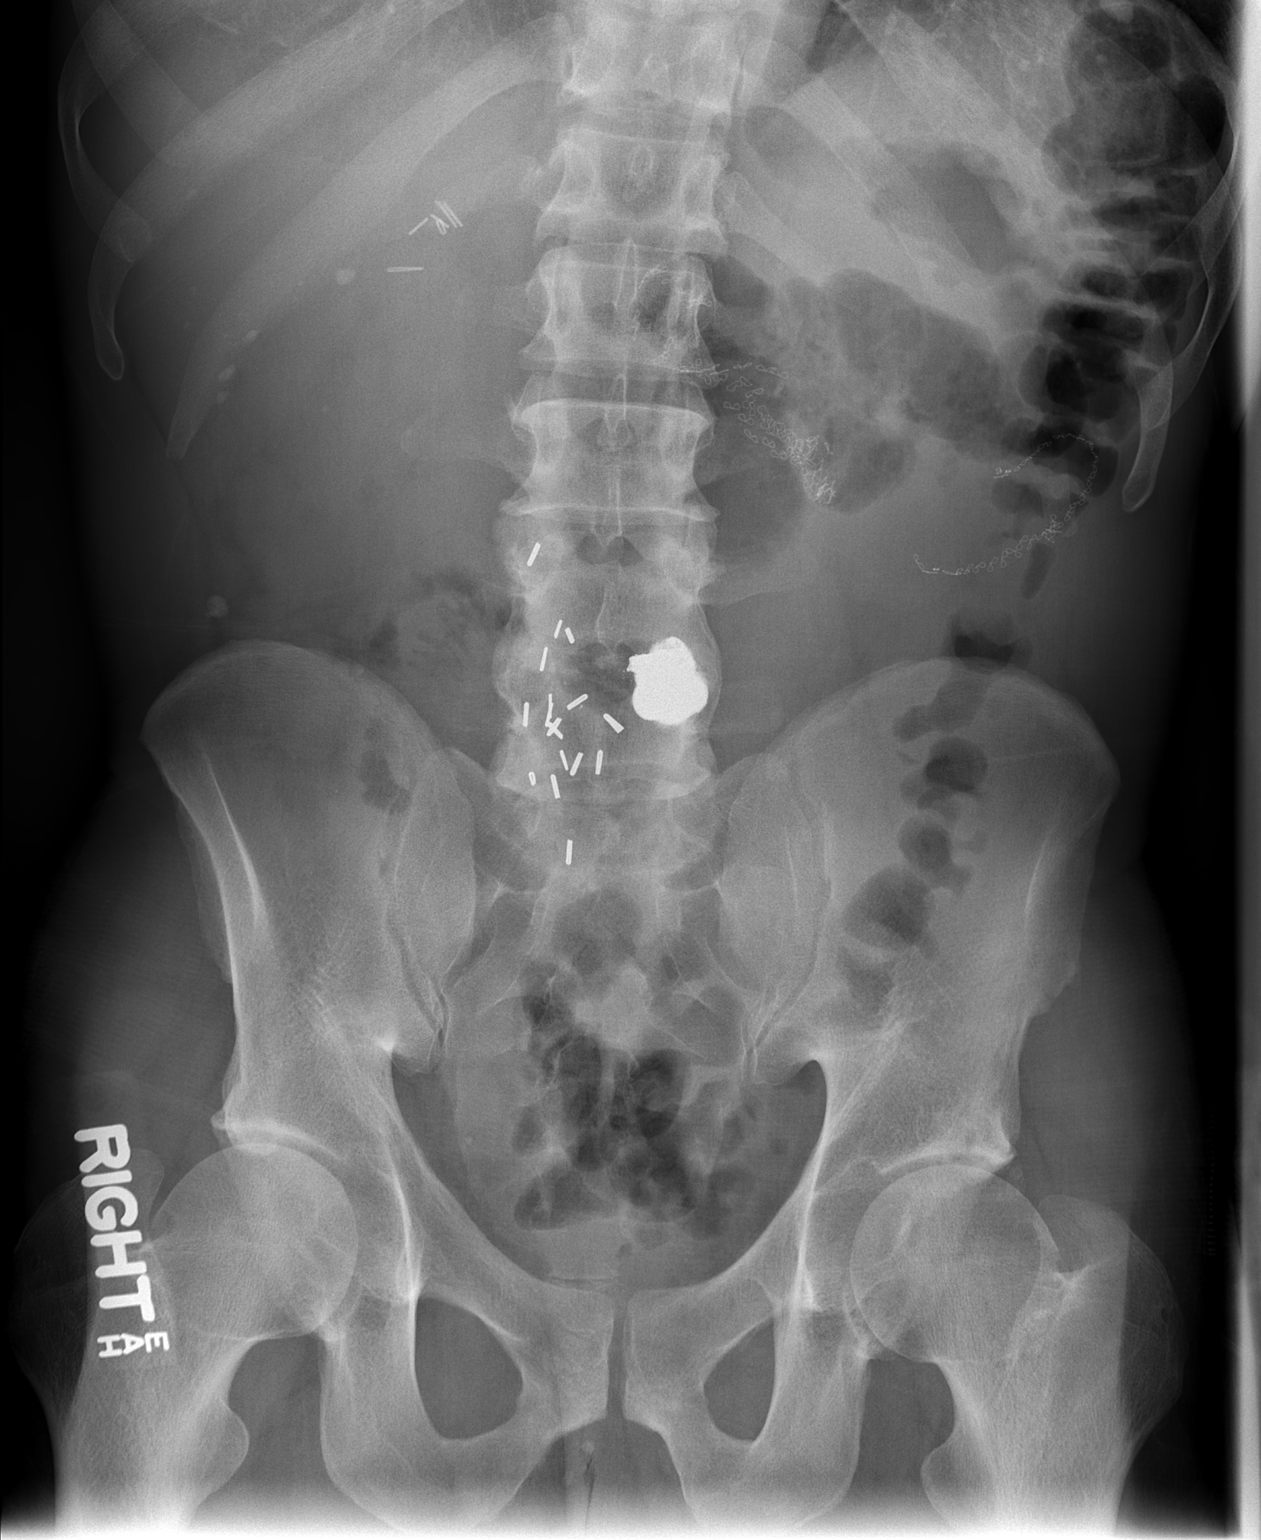

[2 of 2 positions shown; findings below may reference images not displayed]

FINDINGS: Upright and supine views.  The upright view demonstrates
decreased air fluid levels within small bowel loops in the left
side of the abdomen.  Calcifications and surgical changes in the
upper pelvis.

Supine view demonstrates improved small bowel dilatation.  Normal
caliber colon.  Distal gas and stool.  Scattered calcifications in
the right upper quadrant are likely vascular.  Cholecystectomy.  No
free intraperitoneal air.
IMPRESSION: Improved small bowel dilatation and air-fluid levels.  Either
resolving ileus or low grade partial small bowel obstruction.

## 2011-12-29 ENCOUNTER — Emergency Department (HOSPITAL_COMMUNITY)
Admission: EM | Admit: 2011-12-29 | Discharge: 2011-12-29 | Disposition: A | Discharge status: Home / Self Care | Payer: Self-pay | Attending: Emergency Medicine | Admitting: Emergency Medicine

## 2011-12-29 ENCOUNTER — Encounter (HOSPITAL_COMMUNITY): Payer: Self-pay | Admitting: Emergency Medicine

## 2011-12-29 ENCOUNTER — Emergency Department (HOSPITAL_COMMUNITY): Payer: Self-pay

## 2011-12-29 DIAGNOSIS — R110 Nausea: Secondary | ICD-10-CM | POA: Insufficient documentation

## 2011-12-29 DIAGNOSIS — Z9089 Acquired absence of other organs: Secondary | ICD-10-CM | POA: Insufficient documentation

## 2011-12-29 DIAGNOSIS — R10819 Abdominal tenderness, unspecified site: Secondary | ICD-10-CM | POA: Insufficient documentation

## 2011-12-29 DIAGNOSIS — R109 Unspecified abdominal pain: Secondary | ICD-10-CM | POA: Insufficient documentation

## 2011-12-29 DIAGNOSIS — Z87828 Personal history of other (healed) physical injury and trauma: Secondary | ICD-10-CM | POA: Insufficient documentation

## 2011-12-29 LAB — URINALYSIS, ROUTINE W REFLEX MICROSCOPIC
Glucose, UA: NEGATIVE mg/dL
Hgb urine dipstick: NEGATIVE
Nitrite: NEGATIVE
Protein, ur: 30 mg/dL — AB
Specific Gravity, Urine: 1.038 — ABNORMAL HIGH (ref 1.005–1.030)
Urobilinogen, UA: 1 mg/dL (ref 0.0–1.0)
pH: 6 (ref 5.0–8.0)

## 2011-12-29 LAB — POCT I-STAT, CHEM 8
BUN: 18 mg/dL (ref 6–23)
Calcium, Ion: 1.15 mmol/L (ref 1.12–1.32)
Chloride: 100 mEq/L (ref 96–112)
Creatinine, Ser: 1.1 mg/dL (ref 0.50–1.35)
Glucose, Bld: 121 mg/dL — ABNORMAL HIGH (ref 70–99)
HCT: 56 % — ABNORMAL HIGH (ref 39.0–52.0)
Hemoglobin: 19 g/dL — ABNORMAL HIGH (ref 13.0–17.0)
Potassium: 3.9 mEq/L (ref 3.5–5.1)
Sodium: 135 mEq/L (ref 135–145)
TCO2: 28 mmol/L (ref 0–100)

## 2011-12-29 LAB — URINE MICROSCOPIC-ADD ON

## 2011-12-29 MED ORDER — ONDANSETRON HCL 4 MG/2ML IJ SOLN
4.0000 mg | Freq: Once | INTRAMUSCULAR | Status: AC
Start: 2011-12-29 — End: 2011-12-29
  Administered 2011-12-29: 4 mg via INTRAVENOUS
  Filled 2011-12-29: qty 2

## 2011-12-29 MED ORDER — DIPHENHYDRAMINE HCL 50 MG/ML IJ SOLN
25.0000 mg | Freq: Once | INTRAMUSCULAR | Status: AC
  Administered 2011-12-29: 25 mg via INTRAVENOUS
  Filled 2011-12-29: qty 1

## 2011-12-29 MED ORDER — SODIUM CHLORIDE 0.9 % IV BOLUS (SEPSIS)
1000.0000 mL | Freq: Once | INTRAVENOUS | Status: AC
  Administered 2011-12-29: 1000 mL via INTRAVENOUS

## 2011-12-29 MED ORDER — HYDROMORPHONE HCL PF 1 MG/ML IJ SOLN
1.0000 mg | Freq: Once | INTRAMUSCULAR | Status: AC
  Administered 2011-12-29: 1 mg via INTRAVENOUS
  Filled 2011-12-29: qty 1

## 2011-12-29 NOTE — ED Notes (Signed)
MD at bedside. 

## 2011-12-29 NOTE — ED Notes (Signed)
Per EMS, patient has had abdominal pain x 2 days in the RLQ.  Per EMS, pt reports nausea and vomiting and dark urine that he noticed today.  Per EMS, pt last ate 2 days ago.  Per EMS pt has been having regular bowel movements.
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
## 2011-12-29 NOTE — ED Notes (Signed)
AVW:[id number]<XB> Expected date:12/29/11<BR> Expected time: 1:03 AM<BR> Means of arrival:Ambulance<BR> Comments:<BR> Rt lower abd pain
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
## 2012-01-20 ENCOUNTER — Emergency Department (HOSPITAL_COMMUNITY): Payer: Self-pay

## 2012-01-20 ENCOUNTER — Emergency Department (HOSPITAL_COMMUNITY)
Admission: EM | Admit: 2012-01-20 | Discharge: 2012-01-21 | Disposition: A | Discharge status: Home / Self Care | Payer: Self-pay | Attending: Emergency Medicine | Admitting: Emergency Medicine

## 2012-01-20 ENCOUNTER — Encounter (HOSPITAL_COMMUNITY): Payer: Self-pay | Admitting: Emergency Medicine

## 2012-01-20 DIAGNOSIS — R112 Nausea with vomiting, unspecified: Secondary | ICD-10-CM | POA: Insufficient documentation

## 2012-01-20 DIAGNOSIS — R1011 Right upper quadrant pain: Secondary | ICD-10-CM | POA: Insufficient documentation

## 2012-01-20 DIAGNOSIS — G8929 Other chronic pain: Secondary | ICD-10-CM | POA: Insufficient documentation

## 2012-01-20 DIAGNOSIS — F17200 Nicotine dependence, unspecified, uncomplicated: Secondary | ICD-10-CM | POA: Insufficient documentation

## 2012-01-20 LAB — DIFFERENTIAL
Basophils Absolute: 0 10*3/uL (ref 0.0–0.1)
Basophils Relative: 1 % (ref 0–1)
Eosinophils Absolute: 0.1 10*3/uL (ref 0.0–0.7)
Eosinophils Relative: 1 % (ref 0–5)
Lymphocytes Relative: 39 % (ref 12–46)
Lymphs Abs: 3.3 10*3/uL (ref 0.7–4.0)
Monocytes Absolute: 0.7 10*3/uL (ref 0.1–1.0)
Monocytes Relative: 8 % (ref 3–12)
Neutro Abs: 4.4 10*3/uL (ref 1.7–7.7)
Neutrophils Relative %: 52 % (ref 43–77)

## 2012-01-20 LAB — COMPREHENSIVE METABOLIC PANEL
ALT: 24 U/L (ref 0–53)
AST: 22 U/L (ref 0–37)
Albumin: 4.3 g/dL (ref 3.5–5.2)
Alkaline Phosphatase: 71 U/L (ref 39–117)
BUN: 9 mg/dL (ref 6–23)
CO2: 22 mEq/L (ref 19–32)
Calcium: 10.2 mg/dL (ref 8.4–10.5)
Chloride: 103 mEq/L (ref 96–112)
Creatinine, Ser: 0.96 mg/dL (ref 0.50–1.35)
GFR calc Af Amer: >90 mL/min (ref >90)
GFR calc non Af Amer: >90 mL/min (ref >90)
Glucose, Bld: 107 mg/dL — ABNORMAL HIGH (ref 70–99)
Potassium: 3.6 mEq/L (ref 3.5–5.1)
Sodium: 139 mEq/L (ref 135–145)
Total Bilirubin: 0.4 mg/dL (ref 0.3–1.2)
Total Protein: 7.9 g/dL (ref 6.0–8.3)

## 2012-01-20 LAB — CBC
HCT: 44.9 % (ref 39.0–52.0)
Hemoglobin: 15.8 g/dL (ref 13.0–17.0)
MCH: 31.5 pg (ref 26.0–34.0)
MCHC: 35.2 g/dL (ref 30.0–36.0)
MCV: 89.6 fL (ref 78.0–100.0)
Platelets: 185 10*3/uL (ref 150–400)
RBC: 5.01 MIL/uL (ref 4.22–5.81)
RDW: 13.3 % (ref 11.5–15.5)
WBC: 8.8 10*3/uL (ref 4.0–10.5)

## 2012-01-20 LAB — LIPASE, BLOOD: Lipase: 34 U/L (ref 11–59)

## 2012-01-20 MED ORDER — HYDROMORPHONE HCL PF 1 MG/ML IJ SOLN
1.0000 mg | INTRAMUSCULAR | Status: DC | PRN
  Administered 2012-01-20: 1 mg via INTRAVENOUS

## 2012-01-20 MED ORDER — HYDROMORPHONE HCL PF 2 MG/ML IJ SOLN
2.0000 mg | Freq: Once | INTRAMUSCULAR | Status: AC
  Administered 2012-01-20: 2 mg via INTRAVENOUS

## 2012-01-20 MED ORDER — ONDANSETRON HCL 4 MG/2ML IJ SOLN
4.0000 mg | Freq: Once | INTRAMUSCULAR | Status: AC
  Administered 2012-01-20: 4 mg via INTRAVENOUS
  Filled 2012-01-20: qty 2

## 2012-01-20 MED ORDER — HYDROMORPHONE HCL PF 1 MG/ML IJ SOLN
INTRAMUSCULAR | Status: AC
  Filled 2012-01-20: qty 1

## 2012-01-20 MED ORDER — HYDROMORPHONE HCL PF 2 MG/ML IJ SOLN
INTRAMUSCULAR | Status: AC
  Administered 2012-01-20: 2 mg via INTRAVENOUS
  Filled 2012-01-20: qty 1

## 2012-01-20 MED ORDER — ONDANSETRON 8 MG PO TBDP: 8.0000 mg | ORAL_TABLET | Freq: Three times a day (TID) | ORAL | Status: AC | PRN

## 2012-01-20 NOTE — ED Notes (Signed)
Patient complaining of right upper abdominal pain for the past two days; reports nausea and vomiting.  Patient has history of reoccurring abdominal pain.

## 2012-01-20 NOTE — ED Notes (Signed)
Pt states pain has returned, requesting pain medication. RN told pt he can have his scheduled dose of hydromorphone at 00:01.

## 2012-01-20 NOTE — Discharge Instructions (Signed)

## 2012-01-20 NOTE — ED Notes (Signed)
Pt states that vomiting will make his pain decrease. Pt attempted to stick his finger down his throat several times to induce vomiting. RN advise pt against this. Pt continues to try, vomiting bile like substance.
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
## 2012-01-21 LAB — URINALYSIS, ROUTINE W REFLEX MICROSCOPIC
Bilirubin Urine: NEGATIVE
Glucose, UA: NEGATIVE mg/dL
Ketones, ur: 15 mg/dL — AB
Nitrite: NEGATIVE
Protein, ur: 100 mg/dL — AB
Specific Gravity, Urine: 1.038 — ABNORMAL HIGH (ref 1.005–1.030)
Urobilinogen, UA: 1 mg/dL (ref 0.0–1.0)
pH: 5.5 (ref 5.0–8.0)

## 2012-01-21 LAB — URINE MICROSCOPIC-ADD ON

## 2012-02-03 ENCOUNTER — Emergency Department (HOSPITAL_COMMUNITY): Payer: Self-pay

## 2012-02-03 ENCOUNTER — Emergency Department (HOSPITAL_COMMUNITY)
Admission: EM | Admit: 2012-02-03 | Discharge: 2012-02-03 | Disposition: A | Discharge status: Home / Self Care | Payer: Self-pay | Attending: Emergency Medicine | Admitting: Emergency Medicine

## 2012-02-03 ENCOUNTER — Encounter (HOSPITAL_COMMUNITY): Payer: Self-pay | Admitting: *Deleted

## 2012-02-03 DIAGNOSIS — R197 Diarrhea, unspecified: Secondary | ICD-10-CM | POA: Insufficient documentation

## 2012-02-03 DIAGNOSIS — R112 Nausea with vomiting, unspecified: Secondary | ICD-10-CM | POA: Insufficient documentation

## 2012-02-03 DIAGNOSIS — G8929 Other chronic pain: Secondary | ICD-10-CM | POA: Insufficient documentation

## 2012-02-03 DIAGNOSIS — R630 Anorexia: Secondary | ICD-10-CM | POA: Insufficient documentation

## 2012-02-03 DIAGNOSIS — F17200 Nicotine dependence, unspecified, uncomplicated: Secondary | ICD-10-CM | POA: Insufficient documentation

## 2012-02-03 DIAGNOSIS — R1011 Right upper quadrant pain: Secondary | ICD-10-CM | POA: Insufficient documentation

## 2012-02-03 DIAGNOSIS — K117 Disturbances of salivary secretion: Secondary | ICD-10-CM | POA: Insufficient documentation

## 2012-02-03 DIAGNOSIS — R10811 Right upper quadrant abdominal tenderness: Secondary | ICD-10-CM | POA: Insufficient documentation

## 2012-02-03 LAB — DIFFERENTIAL
Basophils Absolute: 0.1 10*3/uL (ref 0.0–0.1)
Basophils Relative: 1 % (ref 0–1)
Eosinophils Absolute: 0.1 10*3/uL (ref 0.0–0.7)
Eosinophils Relative: 1 % (ref 0–5)
Lymphocytes Relative: 36 % (ref 12–46)
Lymphs Abs: 3.9 10*3/uL (ref 0.7–4.0)
Monocytes Absolute: 0.8 10*3/uL (ref 0.1–1.0)
Monocytes Relative: 7 % (ref 3–12)
Neutro Abs: 6.1 10*3/uL (ref 1.7–7.7)
Neutrophils Relative %: 56 % (ref 43–77)

## 2012-02-03 LAB — CBC
HCT: 45.3 % (ref 39.0–52.0)
Hemoglobin: 16.5 g/dL (ref 13.0–17.0)
MCH: 32.2 pg (ref 26.0–34.0)
MCHC: 36.4 g/dL — ABNORMAL HIGH (ref 30.0–36.0)
MCV: 88.3 fL (ref 78.0–100.0)
Platelets: 222 10*3/uL (ref 150–400)
RBC: 5.13 MIL/uL (ref 4.22–5.81)
RDW: 12.9 % (ref 11.5–15.5)
WBC: 10.9 10*3/uL — ABNORMAL HIGH (ref 4.0–10.5)

## 2012-02-03 LAB — HEPATIC FUNCTION PANEL
ALT: 36 U/L (ref 0–53)
AST: 30 U/L (ref 0–37)
Albumin: 4.2 g/dL (ref 3.5–5.2)
Alkaline Phosphatase: 73 U/L (ref 39–117)
Bilirubin, Direct: <0.1 mg/dL (ref 0.0–0.3)
Total Bilirubin: 0.3 mg/dL (ref 0.3–1.2)
Total Protein: 8.2 g/dL (ref 6.0–8.3)

## 2012-02-03 LAB — LIPASE, BLOOD: Lipase: 29 U/L (ref 11–59)

## 2012-02-03 LAB — BASIC METABOLIC PANEL
BUN: 6 mg/dL (ref 6–23)
CO2: 23 mEq/L (ref 19–32)
Calcium: 10.2 mg/dL (ref 8.4–10.5)
Chloride: 99 mEq/L (ref 96–112)
Creatinine, Ser: 0.9 mg/dL (ref 0.50–1.35)
GFR calc Af Amer: >90 mL/min (ref >90)
GFR calc non Af Amer: >90 mL/min (ref >90)
Glucose, Bld: 77 mg/dL (ref 70–99)
Potassium: 3.4 mEq/L — ABNORMAL LOW (ref 3.5–5.1)
Sodium: 135 mEq/L (ref 135–145)

## 2012-02-03 LAB — LACTIC ACID, PLASMA: Lactic Acid, Venous: 1.7 mmol/L (ref 0.5–2.2)

## 2012-02-03 MED ORDER — FENTANYL CITRATE 0.05 MG/ML IJ SOLN
50.0000 ug | Freq: Once | INTRAMUSCULAR | Status: AC
  Administered 2012-02-03: 50 ug via NASAL

## 2012-02-03 MED ORDER — HYDROMORPHONE HCL PF 2 MG/ML IJ SOLN
2.0000 mg | Freq: Once | INTRAMUSCULAR | Status: AC
  Administered 2012-02-03: 2 mg via INTRAMUSCULAR
  Filled 2012-02-03: qty 1

## 2012-02-03 MED ORDER — ONDANSETRON HCL 8 MG PO TABS: 8.0000 mg | ORAL_TABLET | Freq: Three times a day (TID) | ORAL | Status: AC | PRN

## 2012-02-03 MED ORDER — HYDROMORPHONE HCL PF 1 MG/ML IJ SOLN
1.0000 mg | Freq: Once | INTRAMUSCULAR | Status: AC
  Administered 2012-02-03: 1 mg via INTRAVENOUS
  Filled 2012-02-03: qty 1

## 2012-02-03 MED ORDER — SODIUM CHLORIDE 0.9 % IV SOLN
INTRAVENOUS | Status: DC
  Administered 2012-02-03: 20:00:00 via INTRAVENOUS

## 2012-02-03 MED ORDER — OXYCODONE-ACETAMINOPHEN 5-325 MG PO TABS: 2.0000 | ORAL_TABLET | ORAL | Status: DC | PRN

## 2012-02-03 MED ORDER — FENTANYL CITRATE 0.05 MG/ML IJ SOLN
INTRAMUSCULAR | Status: AC
  Filled 2012-02-03: qty 2

## 2012-02-03 MED ORDER — ONDANSETRON 4 MG PO TBDP
ORAL_TABLET | ORAL | Status: AC
  Filled 2012-02-03: qty 2

## 2012-02-03 MED ORDER — DIPHENHYDRAMINE HCL 25 MG PO CAPS
50.0000 mg | ORAL_CAPSULE | Freq: Once | ORAL | Status: AC
  Administered 2012-02-03: 50 mg via ORAL
  Filled 2012-02-03: qty 2

## 2012-02-03 MED ORDER — ONDANSETRON 4 MG PO TBDP
8.0000 mg | ORAL_TABLET | Freq: Once | ORAL | Status: AC
  Administered 2012-02-03: 8 mg via ORAL

## 2012-02-03 MED ORDER — FAMOTIDINE 20 MG PO TABS
20.0000 mg | ORAL_TABLET | Freq: Once | ORAL | Status: AC
  Administered 2012-02-03: 20 mg via ORAL
  Filled 2012-02-03: qty 1

## 2012-02-03 MED ORDER — ONDANSETRON HCL 4 MG PO TABS: 8.0000 mg | ORAL_TABLET | Freq: Three times a day (TID) | ORAL | Status: DC | PRN

## 2012-02-03 NOTE — Discharge Instructions (Signed)
Abdominal Pain Abdominal pain can be caused by many things. Your caregiver decides the seriousness of your pain by an examination and possibly blood tests and X-rays. Many cases can be observed and treated at home. Most abdominal pain is not caused by a disease and will probably improve without treatment. However, in many cases, more time must pass before a clear cause of the pain can be found. Before that point, it may not be known if you need more testing, or if hospitalization or surgery is needed. HOME CARE INSTRUCTIONS   Do not take laxatives unless directed by your caregiver.   Take pain medicine only as directed by your caregiver.   Only take over-the-counter or prescription medicines for pain, discomfort, or fever as directed by your caregiver.   Try a clear liquid diet (broth, tea, or water) for as long as directed by your caregiver. Slowly move to a bland diet as tolerated.  SEEK IMMEDIATE MEDICAL CARE IF:   The pain does not go away.   You have a fever.   You keep throwing up (vomiting).   The pain is felt only in portions of the abdomen. Pain in the right side could possibly be appendicitis. In an adult, pain in the left lower portion of the abdomen could be colitis or diverticulitis.   You pass bloody or black tarry stools.  MAKE SURE YOU:  Understand these instructions. Nausea and Vomiting Nausea is a sick feeling that often comes before throwing up (vomiting). Vomiting is a reflex where stomach contents come out of your mouth. Vomiting can cause severe loss of body fluids (dehydration). Children and elderly adults can become dehydrated quickly, especially if they also have diarrhea. Nausea and vomiting are symptoms of a condition or disease. It is important to find the cause of your symptoms. CAUSES  Direct irritation of the stomach lining. This irritation can result from increased acid production (gastroesophageal reflux disease), infection, food poisoning, taking certain  medicines (such as nonsteroidal anti-inflammatory drugs), alcohol use, or tobacco use.  Signals from the brain.These signals could be caused by a headache, heat exposure, an inner ear disturbance, increased pressure in the brain from injury, infection, a tumor, or a concussion, pain, emotional stimulus, or metabolic problems.  An obstruction in the gastrointestinal tract (bowel obstruction).  Illnesses such as diabetes, hepatitis, gallbladder problems, appendicitis, kidney problems, cancer, sepsis, atypical symptoms of a heart attack, or eating disorders.  Medical treatments such as chemotherapy and radiation.  Receiving medicine that makes you sleep (general anesthetic) during surgery.  DIAGNOSIS Your caregiver may ask for tests to be done if the problems do not improve after a few days. Tests may also be done if symptoms are severe or if the reason for the nausea and vomiting is not clear. Tests may include: Urine tests.  Blood tests.  Stool tests.  Cultures (to look for evidence of infection).  X-rays or other imaging studies.  Test results can help your caregiver make decisions about treatment or the need for additional tests. TREATMENT You need to stay well hydrated. Drink frequently but in small amounts.You may wish to drink water, sports drinks, clear broth, or eat frozen ice pops or gelatin dessert to help stay hydrated.When you eat, eating slowly may help prevent nausea.There are also some antinausea medicines that may help prevent nausea. HOME CARE INSTRUCTIONS  Take all medicine as directed by your caregiver.  If you do not have an appetite, do not force yourself to eat. However, you must continue  to drink fluids.  If you have an appetite, eat a normal diet unless your caregiver tells you differently.  Eat a variety of complex carbohydrates (rice, wheat, potatoes, bread), lean meats, yogurt, fruits, and vegetables.  Avoid high-fat foods because they are more difficult to digest.    Drink enough water and fluids to keep your urine clear or pale yellow.  If you are dehydrated, ask your caregiver for specific rehydration instructions. Signs of dehydration may include:  Severe thirst.  Dry lips and mouth.  Dizziness.  Dark urine.  Decreasing urine frequency and amount.  Confusion.  Rapid breathing or pulse.  SEEK IMMEDIATE MEDICAL CARE IF:  You have blood or brown flecks (like coffee grounds) in your vomit.  You have black or bloody stools.  You have a severe headache or stiff neck.  You are confused.  You have severe abdominal pain.  You have chest pain or trouble breathing.  You do not urinate at least once every 8 hours.  You develop cold or clammy skin.  You continue to vomit for longer than 24 to 48 hours.  You have a fever.  MAKE SURE YOU:  Understand these instructions.  Will watch your condition.  Will get help right away if you are not doing well or get worse.  Document Released: 11/20/2005 Document Revised: 08/02/2011 Document Reviewed: 04/19/2011  [facility] Patient Information 2012 [place], [place].  Will watch your condition.   Will get help right away if you are not doing well or get worse.  Document Released: 08/30/2005 Document Revised: 08/02/2011 Document Reviewed: 07/08/2008 [facility] Patient Information 2012 [place], [place].
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
## 2012-02-07 ENCOUNTER — Encounter (HOSPITAL_COMMUNITY): Payer: Self-pay | Admitting: *Deleted

## 2012-02-07 ENCOUNTER — Emergency Department (HOSPITAL_COMMUNITY)
Admission: EM | Admit: 2012-02-07 | Discharge: 2012-02-07 | Disposition: A | Discharge status: Home / Self Care | Payer: Self-pay | Attending: Emergency Medicine | Admitting: Emergency Medicine

## 2012-02-07 ENCOUNTER — Emergency Department (HOSPITAL_COMMUNITY): Payer: Self-pay

## 2012-02-07 DIAGNOSIS — R109 Unspecified abdominal pain: Secondary | ICD-10-CM | POA: Insufficient documentation

## 2012-02-07 LAB — COMPREHENSIVE METABOLIC PANEL
ALT: 19 U/L (ref 0–53)
AST: 18 U/L (ref 0–37)
Albumin: 3.7 g/dL (ref 3.5–5.2)
Alkaline Phosphatase: 65 U/L (ref 39–117)
BUN: 6 mg/dL (ref 6–23)
CO2: 24 mEq/L (ref 19–32)
Calcium: 9.4 mg/dL (ref 8.4–10.5)
Chloride: 103 mEq/L (ref 96–112)
Creatinine, Ser: 0.86 mg/dL (ref 0.50–1.35)
GFR calc Af Amer: >90 mL/min (ref >90)
GFR calc non Af Amer: >90 mL/min (ref >90)
Glucose, Bld: 74 mg/dL (ref 70–99)
Potassium: 4 mEq/L (ref 3.5–5.1)
Sodium: 136 mEq/L (ref 135–145)
Total Bilirubin: 0.3 mg/dL (ref 0.3–1.2)
Total Protein: 7.1 g/dL (ref 6.0–8.3)

## 2012-02-07 LAB — CBC
HCT: 40.1 % (ref 39.0–52.0)
Hemoglobin: 14 g/dL (ref 13.0–17.0)
MCH: 31.1 pg (ref 26.0–34.0)
MCHC: 34.9 g/dL (ref 30.0–36.0)
MCV: 89.1 fL (ref 78.0–100.0)
Platelets: 231 10*3/uL (ref 150–400)
RBC: 4.5 MIL/uL (ref 4.22–5.81)
RDW: 12.9 % (ref 11.5–15.5)
WBC: 7.3 10*3/uL (ref 4.0–10.5)

## 2012-02-07 LAB — LIPASE, BLOOD: Lipase: 38 U/L (ref 11–59)

## 2012-02-07 MED ORDER — SODIUM CHLORIDE 0.9 % IV BOLUS (SEPSIS)
1000.0000 mL | Freq: Once | INTRAVENOUS | Status: AC
  Administered 2012-02-07: 1000 mL via INTRAVENOUS

## 2012-02-07 MED ORDER — HYDROMORPHONE HCL PF 1 MG/ML IJ SOLN
1.0000 mg | Freq: Once | INTRAMUSCULAR | Status: AC
  Administered 2012-02-07: 1 mg via INTRAVENOUS

## 2012-02-07 MED ORDER — ONDANSETRON HCL 4 MG/2ML IJ SOLN
4.0000 mg | Freq: Once | INTRAMUSCULAR | Status: AC
  Administered 2012-02-07: 4 mg via INTRAVENOUS
  Filled 2012-02-07: qty 2

## 2012-02-07 MED ORDER — HYDROMORPHONE HCL PF 1 MG/ML IJ SOLN
1.0000 mg | Freq: Once | INTRAMUSCULAR | Status: DC
  Filled 2012-02-07: qty 1

## 2012-02-07 MED ORDER — LORAZEPAM 2 MG/ML IJ SOLN
1.0000 mg | Freq: Once | INTRAMUSCULAR | Status: AC
  Administered 2012-02-07: 1 mg via INTRAVENOUS
  Filled 2012-02-07: qty 1

## 2012-02-07 NOTE — ED Notes (Signed)
Per EMS pt from home with c/o lower right side abdominal pain. Hx of surgery at similar site, pain to surgical site. Seen March 2 at [facility] and given rx for percocet/zofran, but did not have rx filled. Pt states pain has become increasingly worse. Vital signs stable. BP 114/74 HR 74 O2 98%RA

## 2012-02-07 NOTE — Discharge Instructions (Signed)
Follow up with primary care doctor in coming week. Return to ER if worse, fevers, persistent vomiting, other concern.  You were given pain medication in the ER - no driving for the next 6 hours.    Abdominal Pain Abdominal pain can be caused by many things. Your caregiver decides the seriousness of your pain by an examination and possibly blood tests and X-rays. Many cases can be observed and treated at home. Most abdominal pain is not caused by a disease and will probably improve without treatment. However, in many cases, more time must pass before a clear cause of the pain can be found. Before that point, it may not be known if you need more testing, or if hospitalization or surgery is needed. HOME CARE INSTRUCTIONS   Do not take laxatives unless directed by your caregiver.   Take pain medicine only as directed by your caregiver.   Only take over-the-counter or prescription medicines for pain, discomfort, or fever as directed by your caregiver.   Try a clear liquid diet (broth, tea, or water) for as long as directed by your caregiver. Slowly move to a bland diet as tolerated.  SEEK IMMEDIATE MEDICAL CARE IF:   The pain does not go away.   You have a fever.   You keep throwing up (vomiting).   The pain is felt only in portions of the abdomen. Pain in the right side could possibly be appendicitis. In an adult, pain in the left lower portion of the abdomen could be colitis or diverticulitis.   You pass bloody or black tarry stools.  MAKE SURE YOU:   Understand these instructions.   Will watch your condition.   Will get help right away if you are not doing well or get worse.  Document Released: 08/30/2005 Document Revised: 11/09/2011 Document Reviewed: 07/08/2008 [facility] Patient Information 2012 [place], [place].     Adhesions Adhesions are stringy (fibrous) bands of tissue. Adhesions are similar to scars, but they are on the inside of your body. Adhesions form between two  surfaces of the body. CAUSES   The most common adhesions are those that occur following surgery. Touching and moving things within the belly (abdomen) leads to inflammation and adhesions can form. Not all people get adhesions following surgery. But, adhesions may happen even with the gentlest handling of the organs in the abdomen. There is no way to predict who will have adhesions. The adhesions can occur between:   Loops of bowel.   The bowel and other organs such as the liver.   The contents in the pelvis such as the uterus, bladder, ovaries and tubes.   Inflammation within the abdomen even if there is no surgery. An example would be an infection in the tubes and uterus (pelvic inflammatory disease). Any infection will cause inflammation that may lead to adhesions.   Radiation treatment.  SYMPTOMS  Many people have no signs or symptoms from adhesions. However, adhesions may cause a number of symptoms. Some of these are:  Abdominal pain, tenderness or cramping.   Abdominal bloating.   Constipation or diarrhea.   Vomiting.   Painful sex.   In females, difficulty getting pregnant.  DIAGNOSIS   An exam by your caregiver may suggest that adhesions are present.   A surgical procedure using a small incision and a thin scope can be used to look inside the abdomen at the adhesions.   Sometimes x-rays may suggest adhesions inside the abdomen.  TREATMENT  Surgery can be performed to  separate the adhesions. This often takes care of the problems caused by the adhesions, although surgery may also cause more adhesions to develop. PROGNOSIS   The outcome is usually good if an operation is used to fix adhesions inside the belly.   All adhesions may reoccur and the problem can come back.  HOME CARE INSTRUCTIONS   Take usual medications as directed by your caregiver.   Only take over-the-counter or prescription medicines for pain, discomfort or fever as directed by your caregiver.    Pay attention to the pain:   Has it changed?   Has it moved?   Is it gone?   Do not eat solid food until your pain is gone.   While you have pain: Stay on a clear liquid diet. A clear liquid is one you can see through (water, weak tea, broth or bouillon, lemon lime carbonated drinks, gelatin, popsicles or ice chips).   When your pain is gone: Start a light diet (dry toast, crackers, applesauce, white rice, bananas, broth or bouillon). Increase the diet slowly as long as it does not bother you. No dairy products (including cheese and eggs) and no spicy, fatty, fried or high fiber foods. Your caregiver will tell you if you should be on a special diet.   No alcohol, caffeine or cigarettes.   If your caregiver has given you a follow-up appointment, it is very important to keep that appointment. Not keeping the appointment could result in a permanent injury or lasting (chronic) pain or disability.  SEEK IMMEDIATE MEDICAL CARE IF:   Your pain is not gone in 24 hours.   Your pain becomes worse, changes location or feels different.   You have a fever.   Your vomiting will not stop.   You have blood or brown flecks (like coffee grounds) in your vomit.   You have blood in your bowel movements.   Your bowel movements are dark or black.   Your bowel movements stop (become blocked) or you can not pass gas.  Document Released: 02/10/2004 Document Revised: 11/09/2011 Document Reviewed: 09/10/2009 [facility] Patient Information 2012 [place], [place].

## 2012-02-07 NOTE — ED Notes (Signed)
Patient transported to X-ray 

## 2012-02-07 NOTE — ED Notes (Signed)
ZOX:[id number]<AV> Expected date:02/07/12<BR> Expected time:11:20 AM<BR> Means of arrival:Ambulance<BR> Comments:<BR> Abdominal pain/hasn&#39;t filled pain med rx
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
## 2012-02-14 ENCOUNTER — Emergency Department (HOSPITAL_COMMUNITY): Payer: Self-pay

## 2012-02-14 ENCOUNTER — Emergency Department (HOSPITAL_COMMUNITY)
Admission: EM | Admit: 2012-02-14 | Discharge: 2012-02-15 | Disposition: A | Discharge status: Home / Self Care | Payer: Self-pay | Attending: Emergency Medicine | Admitting: Emergency Medicine

## 2012-02-14 ENCOUNTER — Encounter (HOSPITAL_COMMUNITY): Payer: Self-pay | Admitting: Emergency Medicine

## 2012-02-14 DIAGNOSIS — K560 Paralytic ileus: Secondary | ICD-10-CM | POA: Insufficient documentation

## 2012-02-14 DIAGNOSIS — K56609 Unspecified intestinal obstruction, unspecified as to partial versus complete obstruction: Secondary | ICD-10-CM | POA: Diagnosis present

## 2012-02-14 DIAGNOSIS — K567 Ileus, unspecified: Secondary | ICD-10-CM

## 2012-02-14 DIAGNOSIS — R110 Nausea: Secondary | ICD-10-CM | POA: Insufficient documentation

## 2012-02-14 DIAGNOSIS — R1084 Generalized abdominal pain: Secondary | ICD-10-CM | POA: Insufficient documentation

## 2012-02-14 LAB — BASIC METABOLIC PANEL
BUN: 9 mg/dL (ref 6–23)
CO2: 21 mEq/L (ref 19–32)
Calcium: 10 mg/dL (ref 8.4–10.5)
Chloride: 104 mEq/L (ref 96–112)
Creatinine, Ser: 0.87 mg/dL (ref 0.50–1.35)
GFR calc Af Amer: >90 mL/min (ref >90)
GFR calc non Af Amer: >90 mL/min (ref >90)
Glucose, Bld: 96 mg/dL (ref 70–99)
Potassium: 3.7 mEq/L (ref 3.5–5.1)
Sodium: 137 mEq/L (ref 135–145)

## 2012-02-14 LAB — CBC
HCT: 43.3 % (ref 39.0–52.0)
Hemoglobin: 15.5 g/dL (ref 13.0–17.0)
MCH: 31.6 pg (ref 26.0–34.0)
MCHC: 35.8 g/dL (ref 30.0–36.0)
MCV: 88.4 fL (ref 78.0–100.0)
Platelets: 213 10*3/uL (ref 150–400)
RBC: 4.9 MIL/uL (ref 4.22–5.81)
RDW: 13.2 % (ref 11.5–15.5)
WBC: 9.3 10*3/uL (ref 4.0–10.5)

## 2012-02-14 MED ORDER — ONDANSETRON HCL 4 MG/2ML IJ SOLN
4.0000 mg | Freq: Once | INTRAMUSCULAR | Status: AC
  Administered 2012-02-14: 4 mg via INTRAVENOUS
  Filled 2012-02-14: qty 2

## 2012-02-14 MED ORDER — SODIUM CHLORIDE 0.9 % IV SOLN
INTRAVENOUS | Status: DC
  Administered 2012-02-14: 20:00:00 via INTRAVENOUS

## 2012-02-14 MED ORDER — HYDROMORPHONE HCL PF 1 MG/ML IJ SOLN
1.0000 mg | Freq: Once | INTRAMUSCULAR | Status: AC
  Administered 2012-02-14: 1 mg via INTRAVENOUS
  Filled 2012-02-14: qty 1

## 2012-02-14 MED ORDER — IOHEXOL 300 MG/ML  SOLN
100.0000 mL | Freq: Once | INTRAMUSCULAR | Status: AC | PRN
  Administered 2012-02-14: 100 mL via INTRAVENOUS
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
## 2012-02-14 NOTE — ED Notes (Signed)
Patient has history of abdominal pain called EMS for pain 10/10 sharp generalized abdominal pain. Denies n/v has diarrhea.
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
## 2012-02-14 NOTE — ED Notes (Signed)
Patient called EMS for chronic history of  generalized abdominal pain denies nausea vomiting has diarrhea. Pain 10/10 sharp.

## 2012-02-14 NOTE — ED Notes (Signed)
Pt drinking contrast. 

## 2012-02-15 DIAGNOSIS — K56609 Unspecified intestinal obstruction, unspecified as to partial versus complete obstruction: Secondary | ICD-10-CM | POA: Diagnosis present

## 2012-02-15 MED ORDER — HYDROMORPHONE HCL PF 1 MG/ML IJ SOLN
1.0000 mg | Freq: Once | INTRAMUSCULAR | Status: AC
  Administered 2012-02-15: 1 mg via INTRAVENOUS
  Filled 2012-02-15: qty 1

## 2012-02-15 MED ORDER — SODIUM CHLORIDE 0.9 % IV BOLUS (SEPSIS)
1000.0000 mL | Freq: Once | INTRAVENOUS | Status: AC
  Administered 2012-02-15: 1000 mL via INTRAVENOUS

## 2012-02-15 MED ORDER — HYDROMORPHONE HCL PF 1 MG/ML IJ SOLN
0.5000 mg | Freq: Once | INTRAMUSCULAR | Status: AC
  Administered 2012-02-15: 0.5 mg via INTRAVENOUS
  Filled 2012-02-15: qty 1

## 2012-02-15 MED ORDER — METOCLOPRAMIDE HCL 5 MG/ML IJ SOLN
10.0000 mg | Freq: Once | INTRAMUSCULAR | Status: AC
  Administered 2012-02-15: 10 mg via INTRAVENOUS
  Filled 2012-02-15: qty 2

## 2012-02-15 NOTE — Discharge Instructions (Signed)
You were seen and evaluated for your complaints of abdominal pain. Your providers today discussed with you option is being admitted for your continued management of your pain. At this time you have reported feeling much better and requested to return home instead of being admitted to the hospital. If you have any worsening pain, distention of your abdomen, nausea, vomiting, fever chills please return to the emergency room immediately. You may return at anytime if you change your mind.  Ileus The intestine (bowel, or gut) is a long muscular tube connecting your stomach to your rectum. If the intestine stops working, food cannot pass through. This is called an ileus. This can happen for a variety of reasons. Ileus is a major medical problem that usually requires hospitalization. If your intestine stops working because of a blockage, this is called a bowel obstruction, and is a different condition. CAUSES   Surgery in your abdomen. This can last from a few hours to a few days.   An infection or inflammation in the belly (abdomen). This includes inflammation of the lining of the abdomen (peritonitis).   Infection or inflammation in other parts of the body, such as pneumonia or pancreatitis.   Passage of gallstones or kidney stones.   Damage to the nerves or blood vessels which go to the bowel.   Imbalance in the salts in the blood (electrolytes).   Injury to the brain and or spinal cord.   Medications. Many medications can cause ileus or make it worse. The most common of these are strong pain medications.  SYMPTOMS  Symptoms of bowel obstruction come from the bowel inactivity. They may include:  Bloating. Your belly gets bigger (distension).   Pain or discomfort in the abdomen.   Poor appetite, feeling sick to your stomach (nausea) and vomiting.   You may also not be able to hear your normal bowel sounds, such as "growling" in your stomach.  DIAGNOSIS   Your history and a physical exam  will usually suggest to your caregiver that you have an ileus.   X-rays or a CT scan of your abdomen will confirm the diagnosis. X-rays, CT scans and lab tests may also suggest the cause.  TREATMENT   Rest the intestine until it starts working again. This is most often accomplished by:   Stopping intake of oral food and drink. Dehydration is prevented by using IV (intravenous) fluids.   Sometimes, a naso-gastric tube (NG tube) is needed. This is a narrow plastic tube inserted through your nose and into your stomach. It is connected to suction to keep the stomach emptied out. This also helps treat the nausea and vomiting.   If there is an imbalance in the electrolytes, they are corrected with supplements in your intravenous fluids.   Medications that might make an ileus worse might be stopped.   There are no medications that reliably treat ileus, though your caregiver may suggest a trial of certain medications.   If your condition is slow to resolve, you will be re-evaluated to be sure another condition, such as a blockage, is not present.  Ileus is common and usually has a good outcome. Depending on cause of your ileus, it usually can be treated by your caregivers with good results. Sometimes, specialists (surgeons or gastroenterologists) are asked to assist in your care.  HOME CARE INSTRUCTIONS   Follow your caregiver's instructions regarding diet and fluid intake. This will usually include drinking plenty of clear fluids, avoiding alcohol and caffeine, and eating a  gentle diet.   Follow your caregiver's instructions regarding activity. A period of rest is sometimes advised before returning to work or school.   Take only medications prescribed by your caregiver. Be especially careful with narcotic pain medication, which can slow your bowel activity and contribute to ileus.   Keep any follow-up appointments with your caregiver or specialists.  SEEK MEDICAL CARE IF:   You have a  recurrence of nausea, vomiting or abdominal discomfort.   You develop fever of more than 102 F (38.9 C).  SEEK IMMEDIATE MEDICAL CARE IF:   You have severe abdominal pain.   You are unable to keep fluids down.  Document Released: 11/23/2003 Document Revised: 11/09/2011 Document Reviewed: 03/25/2009 [facility] Patient Information 2012 [place], [place].   RESOURCE GUIDE  Dental Problems  Patients with Medicaid: [facility] [phone] [address]                                           [phone] [address] Phone:  [phone]                                                  Phone:  [phone]  If unable to pay or uninsured, contact:  [facility] or [facility]. to become qualified for the adult dental clinic.  Chronic Pain Problems Contact [facility]  [phone] Patients need to be referred by their primary care doctor.  Insufficient Money for Medicine Contact [employer]:  call "211" or [facility] 306-076-3337.  No Primary Care Doctor Call [facility]  [phone] Other agencies that provide inexpensive medical care    [facility]  [phone]    [facility]  [phone]    [facility]  [phone]    [facility]  [phone]    [facility]  646-692-0148    [facility]  [phone]  Psychological Services [facility]  [phone] [facility]  7818732533 [facility] Mental [facility]   [phone] (emergency services [phone])  Substance Abuse Resources [facility]  551-628-2485 [facility] 2298535843 [place] (984)718-4397 Floydene Flock 804-171-4614 Residential & Outpatient Substance Abuse Program  9028491182  Abuse/Neglect [facility] Child Abuse Hotline (516)608-0247 [facility] Child Abuse Hotline 916-653-1248 (After Hours)  Emergency Shelter [facility]  [place] [phone]  Maternity Homes Room at the [place] [facility] [place] 701-836-7236 [facility] Services 8601961136  MRSA Hotline #:   501-655-1419    [facility] Resources  [facility] [place]     [employer]                          [facility] Dept. [address] [place]                       [address]      [address]  [address]  Sela Hua Phone:  [phone]                                   Phone:  [phone]                 Phone:  [place] Phone:  [place] [phone] (After Hours)

## 2012-02-15 NOTE — ED Notes (Signed)
Return from ct.

## 2012-02-15 NOTE — ED Notes (Signed)
Applesauce given. Pt tolerated grape juice. MD aware

## 2012-02-15 NOTE — ED Notes (Signed)
Pt tolerated food plate without difficulty
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
## 2012-02-15 NOTE — ED Notes (Signed)
Peter PA informed pt status and tolerance of food bolus.

## 2012-02-16 NOTE — ED Provider Notes (Signed)
Medical screening examination/treatment/procedure(s) were conducted as a shared visit with non-physician practitioner(s) and myself.  I personally evaluated the patient during the encounter  See my original note in the care of patient prior to turnover.  Shelda Jakes, MD 02/16/12 [phone]

## 2012-02-22 ENCOUNTER — Emergency Department (HOSPITAL_COMMUNITY)
Admission: EM | Admit: 2012-02-22 | Discharge: 2012-02-23 | Disposition: A | Discharge status: Home / Self Care | Payer: Self-pay | Attending: Emergency Medicine | Admitting: Emergency Medicine

## 2012-02-22 ENCOUNTER — Encounter (HOSPITAL_COMMUNITY): Payer: Self-pay | Admitting: *Deleted

## 2012-02-22 DIAGNOSIS — R1011 Right upper quadrant pain: Secondary | ICD-10-CM | POA: Insufficient documentation

## 2012-02-22 DIAGNOSIS — G8929 Other chronic pain: Secondary | ICD-10-CM | POA: Insufficient documentation

## 2012-02-22 DIAGNOSIS — R112 Nausea with vomiting, unspecified: Secondary | ICD-10-CM | POA: Insufficient documentation

## 2012-02-22 DIAGNOSIS — R10819 Abdominal tenderness, unspecified site: Secondary | ICD-10-CM | POA: Insufficient documentation

## 2012-02-22 DIAGNOSIS — F17200 Nicotine dependence, unspecified, uncomplicated: Secondary | ICD-10-CM | POA: Insufficient documentation

## 2012-02-22 DIAGNOSIS — R109 Unspecified abdominal pain: Secondary | ICD-10-CM
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
## 2012-02-23 ENCOUNTER — Emergency Department (HOSPITAL_COMMUNITY): Payer: Self-pay

## 2012-02-23 LAB — POCT I-STAT, CHEM 8
BUN: 15 mg/dL (ref 6–23)
Calcium, Ion: 1.21 mmol/L (ref 1.12–1.32)
Chloride: 105 mEq/L (ref 96–112)
Creatinine, Ser: 1 mg/dL (ref 0.50–1.35)
Glucose, Bld: 97 mg/dL (ref 70–99)
HCT: 46 % (ref 39.0–52.0)
Hemoglobin: 15.6 g/dL (ref 13.0–17.0)
Potassium: 4.3 mEq/L (ref 3.5–5.1)
Sodium: 142 mEq/L (ref 135–145)
TCO2: 27 mmol/L (ref 0–100)

## 2012-02-23 LAB — CBC
HCT: 41.3 % (ref 39.0–52.0)
Hemoglobin: 14.5 g/dL (ref 13.0–17.0)
MCH: 31 pg (ref 26.0–34.0)
MCHC: 35.1 g/dL (ref 30.0–36.0)
MCV: 88.4 fL (ref 78.0–100.0)
Platelets: 213 10*3/uL (ref 150–400)
RBC: 4.67 MIL/uL (ref 4.22–5.81)
RDW: 12.7 % (ref 11.5–15.5)
WBC: 9.3 10*3/uL (ref 4.0–10.5)

## 2012-02-23 LAB — DIFFERENTIAL
Basophils Absolute: 0 10*3/uL (ref 0.0–0.1)
Basophils Relative: 0 % (ref 0–1)
Eosinophils Absolute: 0.1 10*3/uL (ref 0.0–0.7)
Eosinophils Relative: 1 % (ref 0–5)
Lymphocytes Relative: 41 % (ref 12–46)
Lymphs Abs: 3.9 10*3/uL (ref 0.7–4.0)
Monocytes Absolute: 0.7 10*3/uL (ref 0.1–1.0)
Monocytes Relative: 7 % (ref 3–12)
Neutro Abs: 4.7 10*3/uL (ref 1.7–7.7)
Neutrophils Relative %: 50 % (ref 43–77)

## 2012-02-23 MED ORDER — ONDANSETRON HCL 4 MG/2ML IJ SOLN
4.0000 mg | Freq: Once | INTRAMUSCULAR | Status: AC
  Administered 2012-02-23: 4 mg via INTRAMUSCULAR
  Filled 2012-02-23: qty 2

## 2012-02-23 MED ORDER — HYDROMORPHONE HCL PF 1 MG/ML IJ SOLN
1.0000 mg | Freq: Once | INTRAMUSCULAR | Status: AC
  Administered 2012-02-23: 1 mg via INTRAMUSCULAR
  Filled 2012-02-23: qty 1

## 2012-02-23 MED ORDER — SODIUM CHLORIDE 0.9 % IV SOLN
Freq: Once | INTRAVENOUS | Status: AC
  Administered 2012-02-23: 03:00:00 via INTRAVENOUS

## 2012-02-23 MED ORDER — ACETAMINOPHEN-CODEINE #3 300-30 MG PO TABS: 1.0000 | ORAL_TABLET | Freq: Four times a day (QID) | ORAL | Status: AC | PRN

## 2012-02-23 MED ORDER — ONDANSETRON HCL 4 MG PO TABS: 4.0000 mg | ORAL_TABLET | Freq: Four times a day (QID) | ORAL | Status: AC
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
## 2012-02-23 NOTE — ED Provider Notes (Signed)
Medical screening examination/treatment/procedure(s) were performed by non-physician practitioner and as supervising physician I was immediately available for consultation/collaboration.   Monterrius Cardosa, MD 02/23/12 0743 

## 2012-02-23 NOTE — Discharge Instructions (Signed)
It is very important establish care with a primary care physician for further evaluation and management of your chronic abdominal pain. You may use Tylenol #. 3 for pain, alternating with ibuprofen for additional pain relief and Zofran for nausea. Return to emergency department for emergent changing or worsening symptoms otherwise, once again chronic pain management is best handled by primary care physician for pain management clinic.

## 2012-03-07 ENCOUNTER — Encounter (HOSPITAL_COMMUNITY): Payer: Self-pay | Admitting: *Deleted

## 2012-03-07 ENCOUNTER — Emergency Department (HOSPITAL_COMMUNITY): Payer: Self-pay

## 2012-03-07 ENCOUNTER — Emergency Department (HOSPITAL_COMMUNITY)
Admission: EM | Admit: 2012-03-07 | Discharge: 2012-03-08 | Disposition: A | Discharge status: Home / Self Care | Payer: Self-pay | Attending: Emergency Medicine | Admitting: Emergency Medicine

## 2012-03-07 DIAGNOSIS — R1011 Right upper quadrant pain: Secondary | ICD-10-CM | POA: Insufficient documentation

## 2012-03-07 DIAGNOSIS — G8929 Other chronic pain: Secondary | ICD-10-CM | POA: Insufficient documentation

## 2012-03-07 LAB — LACTIC ACID, PLASMA: Lactic Acid, Venous: 2.3 mmol/L — ABNORMAL HIGH (ref 0.5–2.2)

## 2012-03-07 LAB — DIFFERENTIAL
Basophils Absolute: 0.1 10*3/uL (ref 0.0–0.1)
Basophils Relative: 1 % (ref 0–1)
Eosinophils Absolute: 0.1 10*3/uL (ref 0.0–0.7)
Eosinophils Relative: 1 % (ref 0–5)
Lymphocytes Relative: 36 % (ref 12–46)
Lymphs Abs: 3.7 10*3/uL (ref 0.7–4.0)
Monocytes Absolute: 0.8 10*3/uL (ref 0.1–1.0)
Monocytes Relative: 8 % (ref 3–12)
Neutro Abs: 5.6 10*3/uL (ref 1.7–7.7)
Neutrophils Relative %: 55 % (ref 43–77)

## 2012-03-07 LAB — COMPREHENSIVE METABOLIC PANEL
ALT: 90 U/L — ABNORMAL HIGH (ref 0–53)
AST: 49 U/L — ABNORMAL HIGH (ref 0–37)
Albumin: 4.4 g/dL (ref 3.5–5.2)
Alkaline Phosphatase: 71 U/L (ref 39–117)
BUN: 9 mg/dL (ref 6–23)
CO2: 27 mEq/L (ref 19–32)
Calcium: 9.8 mg/dL (ref 8.4–10.5)
Chloride: 105 mEq/L (ref 96–112)
Creatinine, Ser: 1.04 mg/dL (ref 0.50–1.35)
GFR calc Af Amer: >90 mL/min (ref >90)
GFR calc non Af Amer: 83 mL/min — ABNORMAL LOW (ref >90)
Glucose, Bld: 114 mg/dL — ABNORMAL HIGH (ref 70–99)
Potassium: 3.9 mEq/L (ref 3.5–5.1)
Sodium: 143 mEq/L (ref 135–145)
Total Bilirubin: 0.4 mg/dL (ref 0.3–1.2)
Total Protein: 8 g/dL (ref 6.0–8.3)

## 2012-03-07 LAB — LIPASE, BLOOD: Lipase: 102 U/L — ABNORMAL HIGH (ref 11–59)

## 2012-03-07 LAB — CBC
HCT: 44.9 % (ref 39.0–52.0)
Hemoglobin: 15.2 g/dL (ref 13.0–17.0)
MCH: 30.9 pg (ref 26.0–34.0)
MCHC: 33.9 g/dL (ref 30.0–36.0)
MCV: 91.3 fL (ref 78.0–100.0)
Platelets: 225 10*3/uL (ref 150–400)
RBC: 4.92 MIL/uL (ref 4.22–5.81)
RDW: 13.2 % (ref 11.5–15.5)
WBC: 10.1 10*3/uL (ref 4.0–10.5)

## 2012-03-07 MED ORDER — ONDANSETRON HCL 4 MG/2ML IJ SOLN
4.0000 mg | Freq: Once | INTRAMUSCULAR | Status: AC
  Administered 2012-03-07: 4 mg via INTRAVENOUS
  Filled 2012-03-07: qty 2

## 2012-03-07 MED ORDER — SODIUM CHLORIDE 0.9 % IV BOLUS (SEPSIS)
1000.0000 mL | Freq: Once | INTRAVENOUS | Status: AC
  Administered 2012-03-07: 1000 mL via INTRAVENOUS

## 2012-03-07 MED ORDER — FENTANYL CITRATE 0.05 MG/ML IJ SOLN
100.0000 ug | Freq: Once | INTRAMUSCULAR | Status: AC
  Administered 2012-03-07: 100 ug via INTRAVENOUS
  Filled 2012-03-07: qty 2

## 2012-03-07 MED ORDER — HYDROMORPHONE HCL PF 1 MG/ML IJ SOLN
1.0000 mg | Freq: Once | INTRAMUSCULAR | Status: AC
  Administered 2012-03-07: 1 mg via INTRAVENOUS
  Filled 2012-03-07: qty 1

## 2012-03-07 MED ORDER — DROPERIDOL 2.5 MG/ML IJ SOLN
2.5000 mg | Freq: Once | INTRAMUSCULAR | Status: AC
  Administered 2012-03-07: 2.5 mg via INTRAVENOUS
  Filled 2012-03-07: qty 1
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
## 2012-03-07 NOTE — ED Notes (Signed)
[id number]<AV> Expected date:03/07/12<BR> Expected time: 7:41 PM<BR> Means of arrival:Ambulance<BR> Comments:<BR> abd pain

## 2012-03-07 NOTE — ED Notes (Signed)
Pt brought in via ems and taken to room 2 and made comfortable.  

## 2012-03-08 ENCOUNTER — Emergency Department (HOSPITAL_COMMUNITY): Payer: Self-pay
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
## 2012-03-08 MED ORDER — IOHEXOL 300 MG/ML  SOLN
100.0000 mL | Freq: Once | INTRAMUSCULAR | Status: AC | PRN
  Administered 2012-03-08: 100 mL via INTRAVENOUS

## 2012-03-08 MED ORDER — HYDROCODONE-ACETAMINOPHEN 5-325 MG PO TABS: 1.0000 | ORAL_TABLET | ORAL | Status: DC | PRN

## 2012-03-08 NOTE — ED Notes (Signed)
MD at bedside. Dr. Le at bedside.  
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
## 2012-03-08 NOTE — ED Notes (Signed)
Patient transported to CT 
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
## 2012-03-08 NOTE — Discharge Instructions (Signed)
Your labs today show that your liver enzymes were slightly elevated. This will need follow up by your primary care doctor. Please plan to keep your appointment with [facility]. You have been given a small prescription of pain medicine here. Take this as prescribed. Return to the ER for worsening condition.  Abdominal Pain Abdominal pain can be caused by many things. Your caregiver decides the seriousness of your pain by an examination and possibly blood tests and X-rays. Many cases can be observed and treated at home. Most abdominal pain is not caused by a disease and will probably improve without treatment. However, in many cases, more time must pass before a clear cause of the pain can be found. Before that point, it may not be known if you need more testing, or if hospitalization or surgery is needed. HOME CARE INSTRUCTIONS   Do not take laxatives unless directed by your caregiver.   Take pain medicine only as directed by your caregiver.   Only take over-the-counter or prescription medicines for pain, discomfort, or fever as directed by your caregiver.   Try a clear liquid diet (broth, tea, or water) for as long as directed by your caregiver. Slowly move to a bland diet as tolerated.  SEEK IMMEDIATE MEDICAL CARE IF:   The pain does not go away.   You have a fever.   You keep throwing up (vomiting).   The pain is felt only in portions of the abdomen. Pain in the right side could possibly be appendicitis. In an adult, pain in the left lower portion of the abdomen could be colitis or diverticulitis.   You pass bloody or black tarry stools.  MAKE SURE YOU:   Understand these instructions.   Will watch your condition.   Will get help right away if you are not doing well or get worse.  Document Released: 08/30/2005 Document Revised: 11/09/2011 Document Reviewed: 07/08/2008 [facility] [place] Patient Information 2012 [place], [place].

## 2012-03-09 ENCOUNTER — Encounter (HOSPITAL_COMMUNITY): Payer: Self-pay | Admitting: Nurse Practitioner

## 2012-03-09 ENCOUNTER — Emergency Department (HOSPITAL_COMMUNITY)
Admission: EM | Admit: 2012-03-09 | Discharge: 2012-03-09 | Disposition: A | Discharge status: Home / Self Care | Payer: Self-pay | Attending: Emergency Medicine | Admitting: Emergency Medicine

## 2012-03-09 DIAGNOSIS — R197 Diarrhea, unspecified: Secondary | ICD-10-CM | POA: Insufficient documentation

## 2012-03-09 DIAGNOSIS — R0682 Tachypnea, not elsewhere classified: Secondary | ICD-10-CM | POA: Insufficient documentation

## 2012-03-09 DIAGNOSIS — R1011 Right upper quadrant pain: Secondary | ICD-10-CM | POA: Insufficient documentation

## 2012-03-09 DIAGNOSIS — R112 Nausea with vomiting, unspecified: Secondary | ICD-10-CM | POA: Insufficient documentation

## 2012-03-09 DIAGNOSIS — G8929 Other chronic pain: Secondary | ICD-10-CM | POA: Insufficient documentation

## 2012-03-09 HISTORY — DX: Acute pancreatitis without necrosis or infection, unspecified: K85.90

## 2012-03-09 LAB — CBC
HCT: 39.2 % (ref 39.0–52.0)
Hemoglobin: 13.9 g/dL (ref 13.0–17.0)
MCH: 31.5 pg (ref 26.0–34.0)
MCHC: 35.5 g/dL (ref 30.0–36.0)
MCV: 88.9 fL (ref 78.0–100.0)
Platelets: 208 10*3/uL (ref 150–400)
RBC: 4.41 MIL/uL (ref 4.22–5.81)
RDW: 12.8 % (ref 11.5–15.5)
WBC: 6.8 10*3/uL (ref 4.0–10.5)

## 2012-03-09 LAB — DIFFERENTIAL
Basophils Absolute: 0 10*3/uL (ref 0.0–0.1)
Basophils Relative: 0 % (ref 0–1)
Eosinophils Absolute: 0 10*3/uL (ref 0.0–0.7)
Eosinophils Relative: 0 % (ref 0–5)
Lymphocytes Relative: 37 % (ref 12–46)
Lymphs Abs: 2.6 10*3/uL (ref 0.7–4.0)
Monocytes Absolute: 0.6 10*3/uL (ref 0.1–1.0)
Monocytes Relative: 9 % (ref 3–12)
Neutro Abs: 3.6 10*3/uL (ref 1.7–7.7)
Neutrophils Relative %: 53 % (ref 43–77)

## 2012-03-09 LAB — COMPREHENSIVE METABOLIC PANEL
ALT: 56 U/L — ABNORMAL HIGH (ref 0–53)
AST: 28 U/L (ref 0–37)
Albumin: 4 g/dL (ref 3.5–5.2)
Alkaline Phosphatase: 67 U/L (ref 39–117)
BUN: 7 mg/dL (ref 6–23)
CO2: 24 mEq/L (ref 19–32)
Calcium: 9.7 mg/dL (ref 8.4–10.5)
Chloride: 104 mEq/L (ref 96–112)
Creatinine, Ser: 0.97 mg/dL (ref 0.50–1.35)
GFR calc Af Amer: >90 mL/min (ref >90)
GFR calc non Af Amer: >90 mL/min (ref >90)
Glucose, Bld: 98 mg/dL (ref 70–99)
Potassium: 3.6 mEq/L (ref 3.5–5.1)
Sodium: 138 mEq/L (ref 135–145)
Total Bilirubin: 0.4 mg/dL (ref 0.3–1.2)
Total Protein: 7.2 g/dL (ref 6.0–8.3)

## 2012-03-09 LAB — LIPASE, BLOOD: Lipase: 23 U/L (ref 11–59)

## 2012-03-09 MED ORDER — HYDROMORPHONE HCL PF 1 MG/ML IJ SOLN
1.0000 mg | Freq: Once | INTRAMUSCULAR | Status: AC
  Administered 2012-03-09: 1 mg via INTRAVENOUS
  Filled 2012-03-09: qty 1

## 2012-03-09 MED ORDER — SODIUM CHLORIDE 0.9 % IV BOLUS (SEPSIS)
1000.0000 mL | Freq: Once | INTRAVENOUS | Status: AC
  Administered 2012-03-09: 1000 mL via INTRAVENOUS

## 2012-03-09 MED ORDER — OXYCODONE-ACETAMINOPHEN 5-325 MG PO TABS: 1.0000 | ORAL_TABLET | ORAL | Status: AC | PRN

## 2012-03-09 MED ORDER — DIPHENHYDRAMINE HCL 50 MG/ML IJ SOLN
25.0000 mg | Freq: Once | INTRAMUSCULAR | Status: AC
  Administered 2012-03-09: 25 mg via INTRAVENOUS
  Filled 2012-03-09: qty 1

## 2012-03-09 MED ORDER — SODIUM CHLORIDE 0.9 % IV SOLN: Freq: Once | INTRAVENOUS | Status: DC

## 2012-03-09 MED ORDER — ONDANSETRON HCL 4 MG/2ML IJ SOLN
4.0000 mg | Freq: Once | INTRAMUSCULAR | Status: AC
  Administered 2012-03-09: 4 mg via INTRAVENOUS
  Filled 2012-03-09: qty 2

## 2012-03-09 MED ORDER — METOCLOPRAMIDE HCL 10 MG PO TABS: 10.0000 mg | ORAL_TABLET | Freq: Four times a day (QID) | ORAL | Status: DC | PRN

## 2012-03-09 NOTE — ED Provider Notes (Signed)
Medical screening examination/treatment/procedure(s) were performed by non-physician practitioner and as supervising physician I was immediately available for consultation/collaboration.  Toy Baker, MD 03/09/12 [phone]
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
## 2012-03-09 NOTE — ED Notes (Addendum)
C/o abd pain and "feels like im bloated' since last night. "think its my pancreatitis." reports nausea and diarrhea. States he was diagnosed with pancreatitis at [facility] last week but never got his pain meds filled for same

## 2012-03-09 NOTE — Discharge Instructions (Signed)
Chronic Pain Chronic pain can be defined as pain that is lasting, off and on, and lasts for 3 to 6 months or longer. Many things cause chronic pain, which can make it difficult to make a discrete diagnosis. There are many treatment options available for chronic pain. However, finding a treatment that works well for you may require trying various approaches until a suitable one is found. CAUSES  In some types of chronic medical conditions, the pain is caused by a normal pain response within the body. A normal pain response helps the body identify illness or injury and prevent further damage from being done. In these cases, the cause of the pain may be identified and treated, even if it may not be cured completely. Examples of chronic conditions which can cause chronic pain include:  Inflammation of the joints (arthritis).   Back pain or neck pain (including bulging or herniated disks).   Migraine headaches.   Cancer.  In some other types of chronic pain syndromes, the pain is caused by an abnormal pain response within the body. An abnormal pain response is present when there is no ongoing cause (or stimulus) for the pain, or when the cause of the pain is arising from the nerves or nervous system itself. Examples of conditions which can cause chronic pain due to an abnormal pain response include:  Fibromyalgia.   Reflex sympathetic dystrophy (RSD).   Neuropathy (when the nerves themselves are damaged, and may cause pain).  DIAGNOSIS  Your caregiver will help diagnose your condition over time. In many cases, the initial focus will be on excluding conditions that could be causing the pain. Depending on your symptoms, your caregiver may order some tests to diagnose your condition. Some of these tests include:  Blood tests.   Computerized X-ray scans (CT scan).   Computerized magnetic scans (MRI).   X-rays.   Ultrasounds.   Nerve conduction studies.   Consultation with other physicians or  specialists.  TREATMENT  There are many treatment options for people suffering from chronic pain. Finding a treatment that works well may take time.   You may be referred to a pain management specialist.   You may be put on medication to help with the pain. Unfortunately, some medications (such as opiate medications) may not be very effective in cases where chronic pain is due to abnormal pain responses. Finding the right medications can take some time.   Adjunctive therapies may be used to provide additional relief and improve a patient's quality of life. These therapies include:   Mindfulness meditation.   Acupuncture.   Biofeedback.   Cognitive-behavioral therapy.   In certain cases, surgical interventions may be attempted.  HOME CARE INSTRUCTIONS   Make sure you understand these instructions prior to discharge.   Ask any questions and share any further concerns you have with your caregiver prior to discharge.   Take all medications as directed by your caregiver.   Keep all follow-up appointments.  SEEK MEDICAL CARE IF:   Your pain gets worse.   You develop a new pain that was not present before.   You cannot tolerate any medications prescribed by your caregiver.   You develop new symptoms since your last visit with your caregiver.  SEEK IMMEDIATE MEDICAL CARE IF:   You develop muscular weakness.   You have decreased sensation or numbness.   You lose control of bowel or bladder function.   Your pain suddenly gets much worse.   You have an oral   temperature above 102 F (38.9 C), not controlled by medication.   You develop shaking chills, confusion, chest pain, or shortness of breath.  Document Released: 08/12/2002 Document Revised: 11/09/2011 Document Reviewed: 11/18/2008 [facility] Patient Information 2012 [place], [place].  Acetaminophen; Oxycodone tablets What is this medicine? ACETAMINOPHEN; OXYCODONE (a set a MEE noe fen; ox i KOE done) is a pain reliever.  It is used to treat mild to moderate pain. This medicine may be used for other purposes; ask your health care provider or pharmacist if you have questions. What should I tell my health care provider before I take this medicine? They need to know if you have any of these conditions: -brain tumor -Crohn's disease, inflammatory bowel disease, or ulcerative colitis -drink more than 3 alcohol containing drinks per day -drug abuse or addiction -head injury -heart or circulation problems -kidney disease or problems going to the bathroom -liver disease -lung disease, asthma, or breathing problems -an unusual or allergic reaction to acetaminophen, oxycodone, other opioid analgesics, other medicines, foods, dyes, or preservatives -pregnant or trying to get pregnant -breast-feeding How should I use this medicine? Take this medicine by mouth with a full glass of water. Follow the directions on the prescription label. Take your medicine at regular intervals. Do not take your medicine more often than directed. Talk to your pediatrician regarding the use of this medicine in children. Special care may be needed. Patients over 43 years old may have a stronger reaction and need a smaller dose. Overdosage: If you think you have taken too much of this medicine contact a poison control center or emergency room at once. NOTE: This medicine is only for you. Do not share this medicine with others. What if I miss a dose? If you miss a dose, take it as soon as you can. If it is almost time for your next dose, take only that dose. Do not take double or extra doses. What may interact with this medicine? -alcohol or medicines that contain alcohol -antihistamines -barbiturates like amobarbital, butalbital, butabarbital, methohexital, pentobarbital, phenobarbital, thiopental, and secobarbital -benztropine -drugs for bladder problems like solifenacin, trospium, oxybutynin, tolterodine, hyoscyamine, and  methscopolamine -drugs for breathing problems like ipratropium and tiotropium -drugs for certain stomach or intestine problems like propantheline, homatropine methylbromide, glycopyrrolate, atropine, belladonna, and dicyclomine -general anesthetics like etomidate, ketamine, nitrous oxide, propofol, desflurane, enflurane, halothane, isoflurane, and sevoflurane -medicines for depression, anxiety, or psychotic disturbances -medicines for pain like codeine, morphine, pentazocine, buprenorphine, butorphanol, nalbuphine, tramadol, and propoxyphene -medicines for sleep -muscle relaxants -naltrexone -phenothiazines like perphenazine, thioridazine, chlorpromazine, mesoridazine, fluphenazine, prochlorperazine, promazine, and trifluoperazine -scopolamine -trihexyphenidyl This list may not describe all possible interactions. Give your health care provider a list of all the medicines, herbs, non-prescription drugs, or dietary supplements you use. Also tell them if you smoke, drink alcohol, or use illegal drugs. Some items may interact with your medicine. What should I watch for while using this medicine? Tell your doctor or health care professional if your pain does not go away, if it gets worse, or if you have new or a different type of pain. You may develop tolerance to the medicine. Tolerance means that you will need a higher dose of the medication for pain relief. Tolerance is normal and is expected if you take this medicine for a long time. Do not suddenly stop taking your medicine because you may develop a severe reaction. Your body becomes used to the medicine. This does NOT mean you are addicted. Addiction is a behavior related to  getting and using a drug for a nonmedical reason. If you have pain, you have a medical reason to take pain medicine. Your doctor will tell you how much medicine to take. If your doctor wants you to stop the medicine, the dose will be slowly lowered over time to avoid any side  effects. You may get drowsy or dizzy. Do not drive, use machinery, or do anything that needs mental alertness until you know how this medicine affects you. Do not stand or sit up quickly, especially if you are an older patient. This reduces the risk of dizzy or fainting spells. Alcohol may interfere with the effect of this medicine. Avoid alcoholic drinks. The medicine will cause constipation. Try to have a bowel movement at least every 2 to 3 days. If you do not have a bowel movement for 3 days, call your doctor or health care professional. Do not take Tylenol (acetaminophen) or medicines that have acetaminophen with this medicine. Too much acetaminophen can be very dangerous. Many nonprescription medicines contain acetaminophen. Always read the labels carefully to avoid taking more acetaminophen. What side effects may I notice from receiving this medicine? Side effects that you should report to your doctor or health care professional as soon as possible: -allergic reactions like skin rash, itching or hives, swelling of the face, lips, or tongue -breathing difficulties, wheezing -confusion -light headedness or fainting spells -severe stomach pain -yellowing of the skin or the whites of the eyes Side effects that usually do not require medical attention (report to your doctor or health care professional if they continue or are bothersome): -dizziness -drowsiness -nausea -vomiting This list may not describe all possible side effects. Call your doctor for medical advice about side effects. You may report side effects to FDA at 1-800-FDA-1088. Where should I keep my medicine? Keep out of the reach of children. This medicine can be abused. Keep your medicine in a safe place to protect it from theft. Do not share this medicine with anyone. Selling or giving away this medicine is dangerous and against the law. Store at room temperature between 20 and 25 degrees C (68 and 77 degrees F). Keep container  tightly closed. Protect from light. Flush any unused medicines down the toilet. Do not use the medicine after the expiration date. NOTE: This sheet is a summary. It may not cover all possible information. If you have questions about this medicine, talk to your doctor, pharmacist, or health care provider.  2012, [employer] Standard. (10/19/2008 10:01:21 AM)  Metoclopramide tablets What is this medicine? METOCLOPRAMIDE (met oh kloe PRA mide) is used to treat the symptoms of gastroesophageal reflux disease (GERD) like heartburn. It is also used to treat people with slow emptying of the stomach and intestinal tract. This medicine may be used for other purposes; ask your health care provider or pharmacist if you have questions. What should I tell my health care provider before I take this medicine? They need to know if you have any of these conditions: -breast cancer -depression -diabetes -heart failure -high blood pressure -kidney disease -liver disease -Parkinson's disease or a movement disorder -pheochromocytoma -seizures -stomach obstruction, bleeding, or perforation -an unusual or allergic reaction to metoclopramide, procainamide, sulfites, other medicines, foods, dyes, or preservatives -pregnant or trying to get pregnant -breast-feeding How should I use this medicine? Take this medicine by mouth with a glass of water. Follow the directions on the prescription label. Take this medicine on an empty stomach, about 30 minutes before eating. Take your doses  at regular intervals. Do not take your medicine more often than directed. Do not stop taking except on the advice of your doctor or health care professional. A special MedGuide will be given to you by the pharmacist with each prescription and refill. Be sure to read this information carefully each time. Talk to your pediatrician regarding the use of this medicine in children. Special care may be needed. Overdosage: If you think you  have taken too much of this medicine contact a poison control center or emergency room at once. NOTE: This medicine is only for you. Do not share this medicine with others. What if I miss a dose? If you miss a dose, take it as soon as you can. If it is almost time for your next dose, take only that dose. Do not take double or extra doses. What may interact with this medicine? -acetaminophen -cyclosporine -digoxin -medicines for blood pressure -medicines for diabetes, including insulin -medicines for hay fever and other allergies -medicines for depression, especially an Monoamine Oxidase Inhibitor (MAOI) -medicines for Parkinson's disease, like levodopa -medicines for sleep or for pain -tetracycline This list may not describe all possible interactions. Give your health care provider a list of all the medicines, herbs, non-prescription drugs, or dietary supplements you use. Also tell them if you smoke, drink alcohol, or use illegal drugs. Some items may interact with your medicine. What should I watch for while using this medicine? It may take a few weeks for your stomach condition to start to get better. However, do not take this medicine for longer than 12 weeks. The longer you take this medicine, and the more you take it, the greater your chances are of developing serious side effects. If you are an elderly patient, a male patient, or you have diabetes, you may be at an increased risk for side effects from this medicine. Contact your doctor immediately if you start having movements you cannot control such as lip smacking, rapid movements of the tongue, involuntary or uncontrollable movements of the eyes, head, arms and legs, or muscle twitches and spasms. Patients and their families should watch out for worsening depression or thoughts of suicide. Also watch out for any sudden or severe changes in feelings such as feeling anxious, agitated, panicky, irritable, hostile, aggressive, impulsive,  severely restless, overly excited and hyperactive, or not being able to sleep. If this happens, especially at the beginning of treatment or after a change in dose, call your doctor. Do not treat yourself for high fever. Ask your doctor or health care professional for advice. You may get drowsy or dizzy. Do not drive, use machinery, or do anything that needs mental alertness until you know how this drug affects you. Do not stand or sit up quickly, especially if you are an older patient. This reduces the risk of dizzy or fainting spells. Alcohol can make you more drowsy and dizzy. Avoid alcoholic drinks. What side effects may I notice from receiving this medicine? Side effects that you should report to your doctor or health care professional as soon as possible: -allergic reactions like skin rash, itching or hives, swelling of the face, lips, or tongue -abnormal production of milk in females -breast enlargement in both males and females -change in the way you walk -difficulty moving, speaking or swallowing -drooling, lip smacking, or rapid movements of the tongue -excessive sweating -fever -involuntary or uncontrollable movements of the eyes, head, arms and legs -irregular heartbeat or palpitations -muscle twitches and spasms -unusually weak or tired  Side effects that usually do not require medical attention (report to your doctor or health care professional if they continue or are bothersome): -change in sex drive or performance -depressed mood -diarrhea -difficulty sleeping -headache -menstrual changes -restless or nervous This list may not describe all possible side effects. Call your doctor for medical advice about side effects. You may report side effects to FDA at 1-800-FDA-1088. Where should I keep my medicine? Keep out of the reach of children. Store at room temperature between 20 and 25 degrees C (68 and 77 degrees F). Protect from light. Keep container tightly closed. Throw away  any unused medicine after the expiration date. NOTE: This sheet is a summary. It may not cover all possible information. If you have questions about this medicine, talk to your doctor, pharmacist, or health care provider.  2012, [employer]vier/Gold Standard. (07/15/2008 4:30:05 PM)
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
## 2012-03-23 IMAGING — US US ABDOMEN COMPLETE
1 series · 14 of 25 positions shown · non-contrast
Comparison: CT 11/11/2010

CLINICAL DATA: Abdominal pain, right upper quadrant pain.

COMPLETE ABDOMINAL ULTRASOUND

[Series 1: us abdomen complete · 0.19mm/px · 14 of 71 slices shown]
[im 1/71]
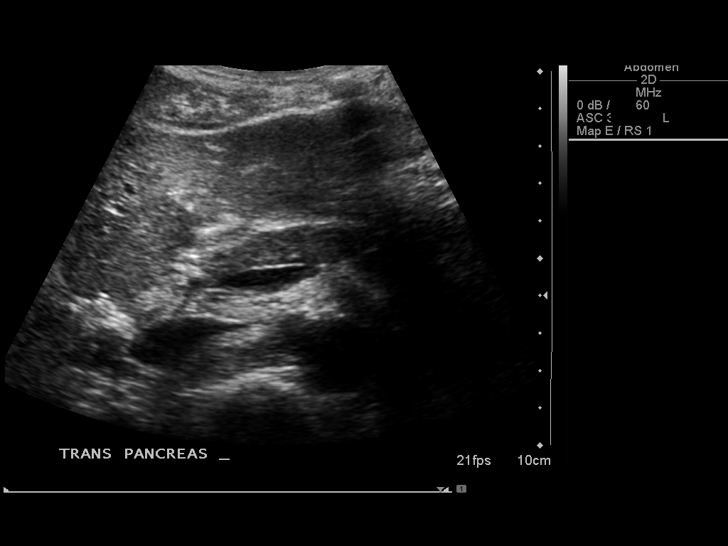
[im 6/71]
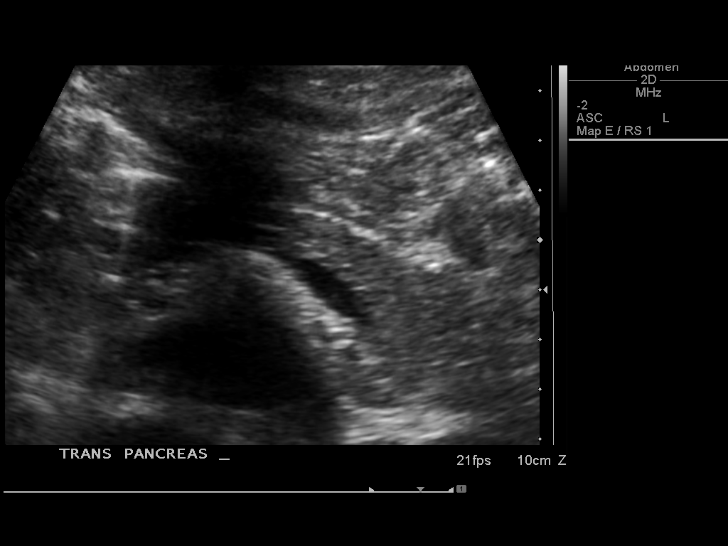
[im 12/71]
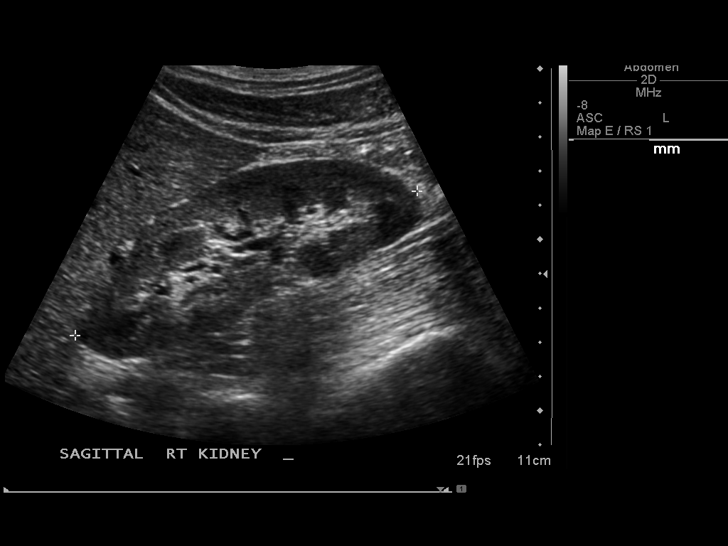
[im 18/71]
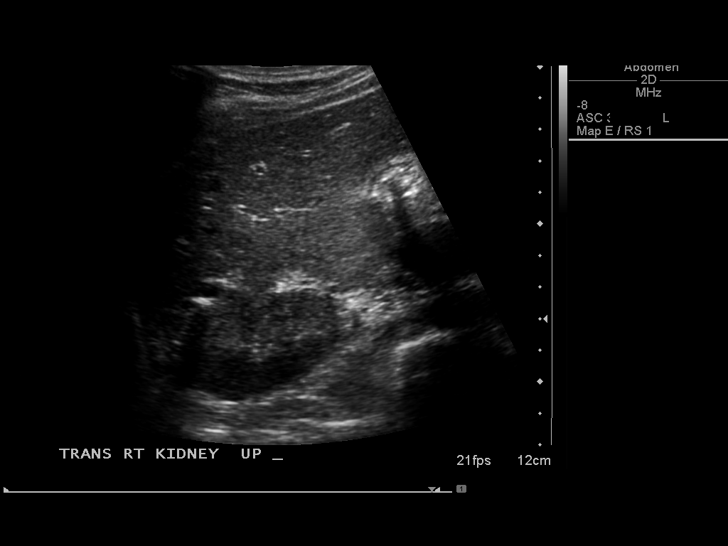
[im 24/71]
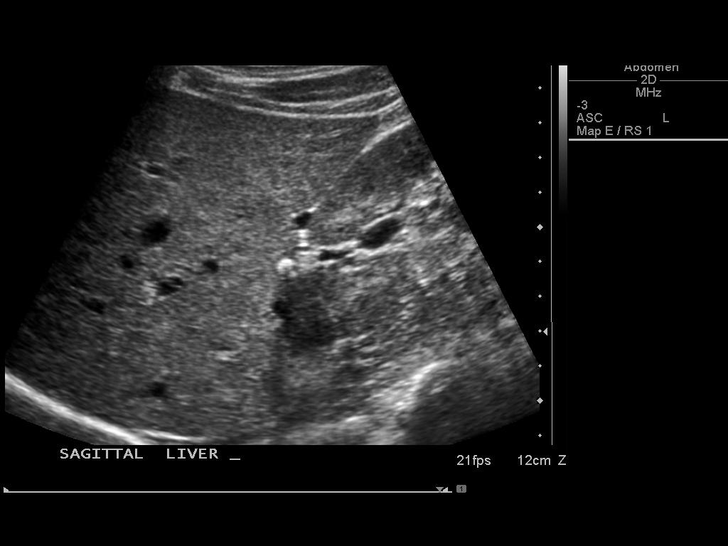
[im 27/71]
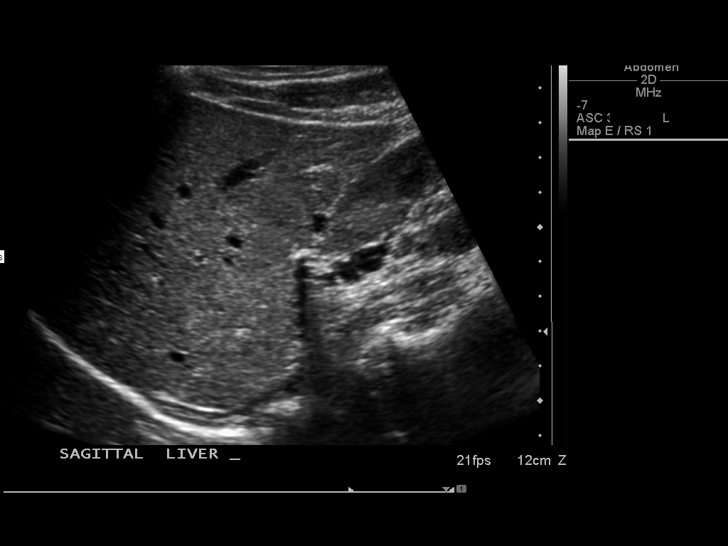
[im 33/71]
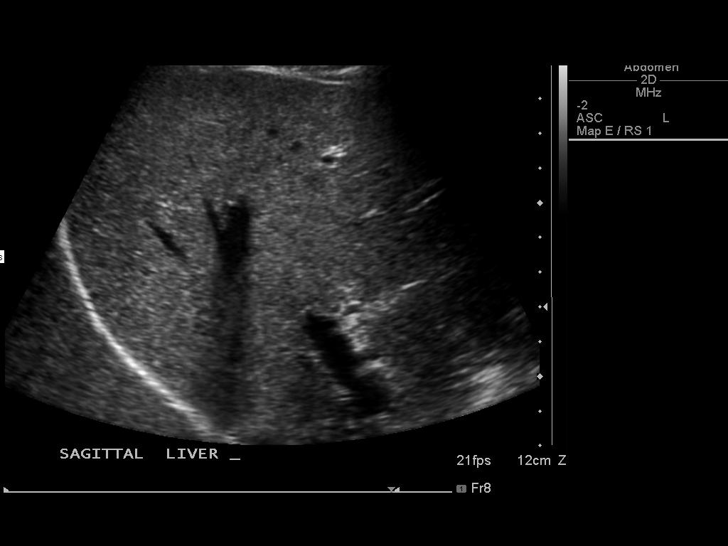
[im 38/71]
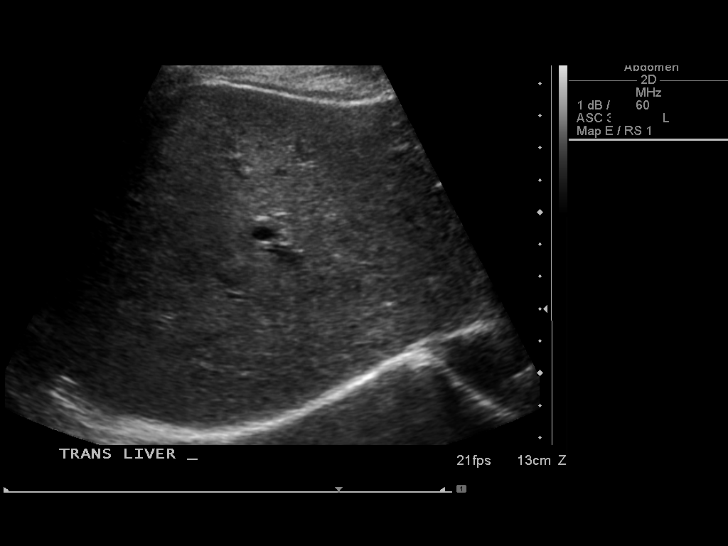
[im 44/71]
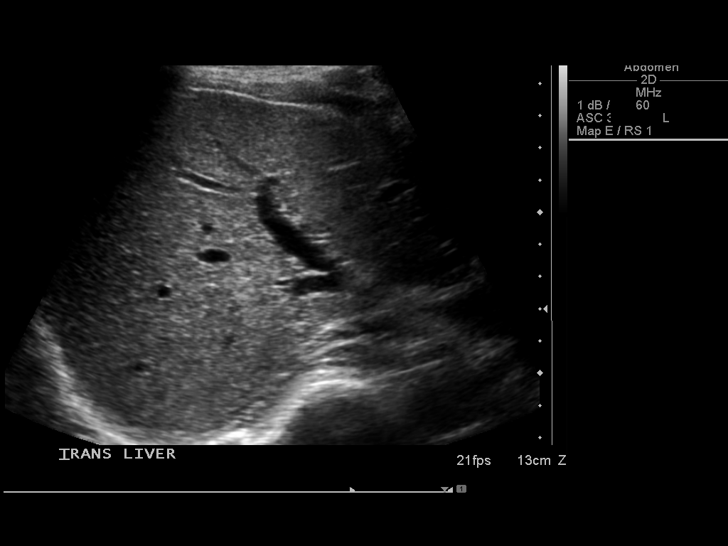
[im 47/71]
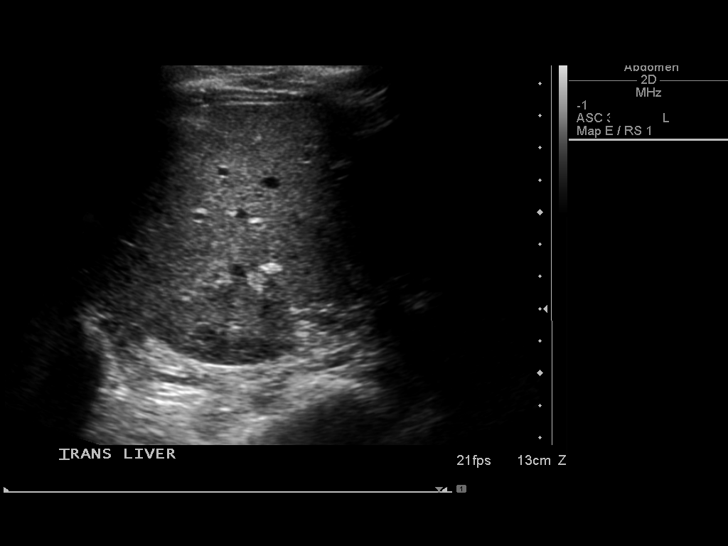
[im 53/71]
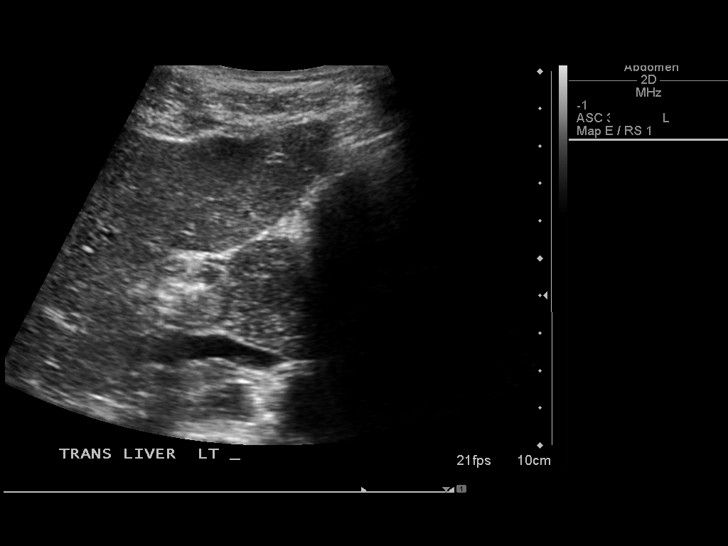
[im 59/71]
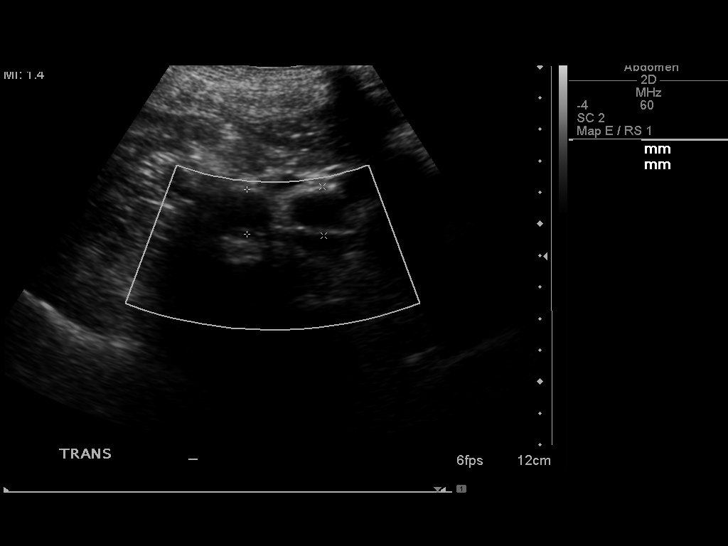
[im 65/71]
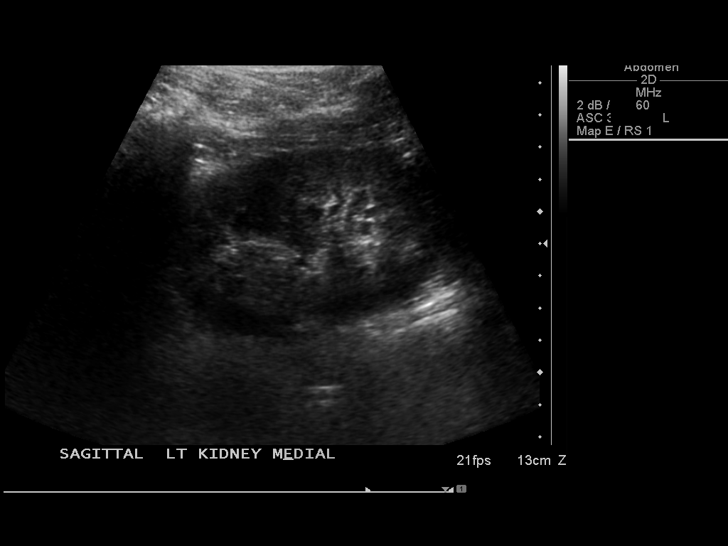
[im 71/71]
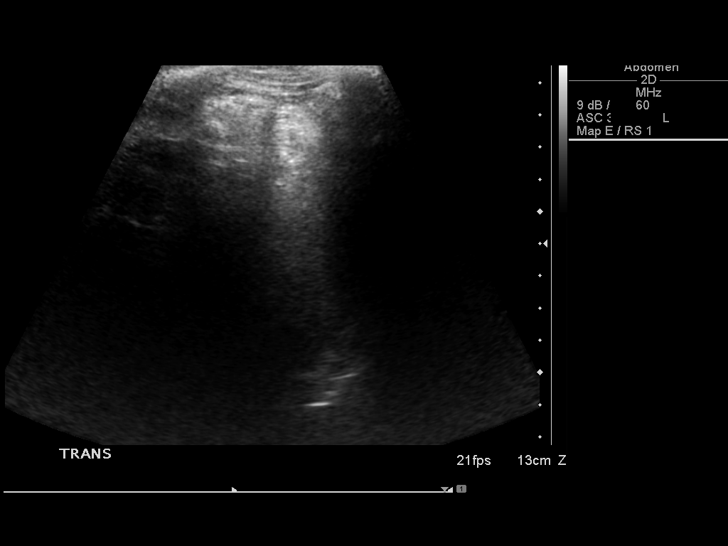

[14 of 25 positions shown; findings below may reference images not displayed]

FINDINGS: Gallbladder:  Surgically absent

Common bile duct:  Normal at 4 mm

Liver:  No focal lesion identified.  Within normal limits in
parenchymal echogenicity.

IVC:  Appears normal.

Pancreas:  No focal abnormality seen.

Spleen:  Normal in size and echogenicity.

Right Kidney:  10.8cm in length.  No evidence of hydronephrosis or
stones.

Left Kidney:  10.4cm in length.  No evidence of hydronephrosis or
stones.

Abdominal aorta:  No aneurysm identified.
IMPRESSION: Cholecystectomy.  No explanation for right upper quadrant pain.
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
## 2012-06-02 ENCOUNTER — Encounter (HOSPITAL_COMMUNITY): Payer: Self-pay | Admitting: Emergency Medicine

## 2012-06-02 ENCOUNTER — Emergency Department (HOSPITAL_COMMUNITY)
Admission: EM | Admit: 2012-06-02 | Discharge: 2012-06-02 | Disposition: A | Discharge status: Home / Self Care | Payer: Self-pay | Attending: Emergency Medicine | Admitting: Emergency Medicine

## 2012-06-02 ENCOUNTER — Emergency Department (HOSPITAL_COMMUNITY): Payer: Self-pay

## 2012-06-02 DIAGNOSIS — R1110 Vomiting, unspecified: Secondary | ICD-10-CM | POA: Insufficient documentation

## 2012-06-02 DIAGNOSIS — F17200 Nicotine dependence, unspecified, uncomplicated: Secondary | ICD-10-CM | POA: Insufficient documentation

## 2012-06-02 DIAGNOSIS — R109 Unspecified abdominal pain: Secondary | ICD-10-CM | POA: Insufficient documentation

## 2012-06-02 DIAGNOSIS — G8929 Other chronic pain: Secondary | ICD-10-CM | POA: Insufficient documentation

## 2012-06-02 LAB — URINALYSIS, ROUTINE W REFLEX MICROSCOPIC
Glucose, UA: NEGATIVE mg/dL
Ketones, ur: NEGATIVE mg/dL
Leukocytes, UA: NEGATIVE
Nitrite: NEGATIVE
Protein, ur: NEGATIVE mg/dL
Specific Gravity, Urine: 1.034 — ABNORMAL HIGH (ref 1.005–1.030)
Urobilinogen, UA: 0.2 mg/dL (ref 0.0–1.0)
pH: 5 (ref 5.0–8.0)

## 2012-06-02 LAB — COMPREHENSIVE METABOLIC PANEL
ALT: 34 U/L (ref 0–53)
AST: 29 U/L (ref 0–37)
Albumin: 4 g/dL (ref 3.5–5.2)
Alkaline Phosphatase: 81 U/L (ref 39–117)
BUN: 11 mg/dL (ref 6–23)
CO2: 21 mEq/L (ref 19–32)
Calcium: 9.6 mg/dL (ref 8.4–10.5)
Chloride: 107 mEq/L (ref 96–112)
Creatinine, Ser: 1.05 mg/dL (ref 0.50–1.35)
GFR calc Af Amer: >90 mL/min (ref >90)
GFR calc non Af Amer: 82 mL/min — ABNORMAL LOW (ref >90)
Glucose, Bld: 123 mg/dL — ABNORMAL HIGH (ref 70–99)
Potassium: 4.1 mEq/L (ref 3.5–5.1)
Sodium: 139 mEq/L (ref 135–145)
Total Bilirubin: 0.3 mg/dL (ref 0.3–1.2)
Total Protein: 7.9 g/dL (ref 6.0–8.3)

## 2012-06-02 LAB — CBC WITH DIFFERENTIAL/PLATELET
Basophils Absolute: 0 10*3/uL (ref 0.0–0.1)
Basophils Relative: 0 % (ref 0–1)
Eosinophils Absolute: 0 10*3/uL (ref 0.0–0.7)
Eosinophils Relative: 0 % (ref 0–5)
HCT: 42.6 % (ref 39.0–52.0)
Hemoglobin: 14.5 g/dL (ref 13.0–17.0)
Lymphocytes Relative: 22 % (ref 12–46)
Lymphs Abs: 2 10*3/uL (ref 0.7–4.0)
MCH: 31.1 pg (ref 26.0–34.0)
MCHC: 34 g/dL (ref 30.0–36.0)
MCV: 91.4 fL (ref 78.0–100.0)
Monocytes Absolute: 0.7 10*3/uL (ref 0.1–1.0)
Monocytes Relative: 8 % (ref 3–12)
Neutro Abs: 6.3 10*3/uL (ref 1.7–7.7)
Neutrophils Relative %: 70 % (ref 43–77)
Platelets: 214 10*3/uL (ref 150–400)
RBC: 4.66 MIL/uL (ref 4.22–5.81)
RDW: 13.9 % (ref 11.5–15.5)
WBC: 9.1 10*3/uL (ref 4.0–10.5)

## 2012-06-02 LAB — URINE MICROSCOPIC-ADD ON

## 2012-06-02 LAB — LIPASE, BLOOD: Lipase: 29 U/L (ref 11–59)

## 2012-06-02 MED ORDER — SODIUM CHLORIDE 0.9 % IV SOLN
Freq: Once | INTRAVENOUS | Status: AC
  Administered 2012-06-02: 11:00:00 via INTRAVENOUS

## 2012-06-02 MED ORDER — HYDROMORPHONE HCL PF 1 MG/ML IJ SOLN
2.0000 mg | Freq: Once | INTRAMUSCULAR | Status: AC
  Administered 2012-06-02: 2 mg via INTRAVENOUS
  Filled 2012-06-02: qty 2

## 2012-06-02 MED ORDER — HYDROMORPHONE HCL PF 1 MG/ML IJ SOLN: 1.0000 mg | Freq: Once | INTRAMUSCULAR | Status: DC

## 2012-06-02 MED ORDER — DIPHENHYDRAMINE HCL 50 MG/ML IJ SOLN
25.0000 mg | Freq: Once | INTRAMUSCULAR | Status: AC
  Administered 2012-06-02: 25 mg via INTRAVENOUS
  Filled 2012-06-02: qty 1

## 2012-06-02 MED ORDER — ONDANSETRON HCL 4 MG/2ML IJ SOLN
INTRAMUSCULAR | Status: AC
  Administered 2012-06-02: 4 mg
  Filled 2012-06-02: qty 2

## 2012-06-02 MED ORDER — ONDANSETRON HCL 4 MG/2ML IJ SOLN
4.0000 mg | Freq: Once | INTRAMUSCULAR | Status: AC
  Administered 2012-06-02: 4 mg via INTRAVENOUS
  Filled 2012-06-02: qty 2

## 2012-06-02 MED ORDER — LORAZEPAM 2 MG/ML IJ SOLN
1.0000 mg | Freq: Once | INTRAMUSCULAR | Status: AC
  Administered 2012-06-02: 1 mg via INTRAVENOUS
  Filled 2012-06-02: qty 1

## 2012-06-02 MED ORDER — SODIUM CHLORIDE 0.9 % IV BOLUS (SEPSIS)
1000.0000 mL | Freq: Once | INTRAVENOUS | Status: AC
  Administered 2012-06-02: 1000 mL via INTRAVENOUS

## 2012-06-02 NOTE — Discharge Instructions (Signed)
Abdominal Pain Abdominal pain can be caused by many things. Your caregiver decides the seriousness of your pain by an examination and possibly blood tests and X-rays. Many cases can be observed and treated at home. Most abdominal pain is not caused by a disease and will probably improve without treatment. However, in many cases, more time must pass before a clear cause of the pain can be found. Before that point, it may not be known if you need more testing, or if hospitalization or surgery is needed. HOME CARE INSTRUCTIONS   Do not take laxatives unless directed by your caregiver.   Take pain medicine only as directed by your caregiver.   Only take over-the-counter or prescription medicines for pain, discomfort, or fever as directed by your caregiver.   Try a clear liquid diet (broth, tea, or water) for as long as directed by your caregiver. Slowly move to a bland diet as tolerated.  SEEK IMMEDIATE MEDICAL CARE IF:   The pain does not go away.   You have a fever.   You keep throwing up (vomiting).   The pain is felt only in portions of the abdomen. Pain in the right side could possibly be appendicitis. In an adult, pain in the left lower portion of the abdomen could be colitis or diverticulitis.   You pass bloody or black tarry stools.  MAKE SURE YOU:   Understand these instructions.   Will watch your condition.   Will get help right away if you are not doing well or get worse.  Document Released: 08/30/2005 Document Revised: 11/09/2011 Document Reviewed: 07/08/2008 [facility] Patient Information 2012 [place], [place].RESOURCE GUIDE  Chronic Pain Problems: Contact [facility]  [phone] Patients need to be referred by their primary care doctor.  Insufficient Money for Medicine: Contact United Way:  call "211" or [facility] [phone].  No Primary Care Doctor: - Call [facility]  [phone] - can help you locate a primary care doctor that  accepts  your insurance, provides certain services, etc. - Physician Referral [phone]  Agencies that provide inexpensive medical care: - [facility]  [phone] - [facility]  [phone] - [facility]  [phone] [facility]  [phone] - [facility]  [phone] [facility]  [phone]  Medicaid-accepting [facility] Providers: - [facility]- [address]  [phone], Mon-Fri 9am-7pm, Sat 9am-1pm - [facility]- [address] [place], [address] [place]  [phone] - [facility]- [address], [address] [place]  [phone] [facility]- [address]  [phone] - Renaye Rakers- [address] [place], [address], [phone]  Only accepts [place] patients after they have their name  applied to their card  Self Pay (no insurance) in [place]: - Sickle Cell Patients: Dr Willey Blade, [facility]  [address] [place], [phone] - [facility]- [address] [place]  [phone]       [facility] [place]- [address]       -     [facility]- see information above (Speak to [employer] if you do not have insurance)       -  [facility]- [address] [place], [phone]       -  [facility] [phone]  [place],  [phone]       -  [facility]- [address], [phone]       -  Dr Julio Sicks-  [address], [place], [phone]       -  [facility]- [address], [phone]       -  [facility]- [address] [phone], also [address] [phone]       -    [facility]- [address] [place], [phone], 1st & 3rd Saturday   every month, 10am-1pm  1) Find a Doctor and Pay Out of Pocket Although you won't have to find out who is covered by your insurance plan, it is a good idea  to ask around and get recommendations. You will then need to call the office and see if the doctor you have chosen will accept you as a new patient and what types of options they offer for patients who are self-pay. Some doctors offer discounts or will set up payment plans for their patients who do not have insurance, but you will need to ask so you aren't surprised when you get to your appointment.  2) Contact Your Local Health Department Not all health departments have doctors that can see patients for sick visits, but many do, so it is worth a call to see if yours does. If you don't know where your local health department is, you can check in your phone book. The CDC also has a tool to help you locate your state's health department, and many state websites also have listings of all of their local health departments.  3) Find a Walk-in Clinic If your illness is not likely to be very severe or complicated, you may want to try a walk in clinic. These are popping up all over the country in pharmacies, drugstores, and shopping centers. They're usually staffed by nurse practitioners or physician assistants that have been trained to treat common illnesses and complaints. They're usually fairly quick and inexpensive. However, if you have serious medical issues or chronic medical problems, these are probably not your best option  STD Testing - [facility] Department of [facility] [place], STD Clinic, [address] [place], phone [phone] or [phone].  Monday - Friday, call for an appointment. [facility] Department of [employer], STD Clinic, [address], [place], phone [phone] or [phone].  Monday - Friday, call for an appointment.  Abuse/Neglect: [facility] Child Abuse Hotline 740-594-5114 [facility] Child Abuse Hotline 573-004-5884 (After Hours)  Emergency Shelter:  [facility] 623-383-5561  Maternity  Homes: - [facility] [place] [facility] [phone] - [facility] [phone]  MRSA Hotline #:   684-217-4058  [facility] Resources  [facility]  United [employer] [facility] Dept. [address]                 [address]         [address] [place] [address]  [address]  [facility] Phone:  [phone]                                  Phone:  [phone]                   Phone:  [phone]  [facility], [phone] - [phone]       -     [facility] in [place], [address]                                  [phone], [facility] Child Abuse Hotline (774)111-4280 or 681-751-9274 (After Hours)   Behavioral Health Services  Substance Abuse Resources: - [facility] Drug Services  6154875879 - [facility] [phone]) [phone] - [facility] [place] [phone] Floydene Flock ([phone] - Residential & Outpatient Substance Abuse Program  [phone]  Psychological Services: [facility]  [phone] [facility] Services  [phone] - [facility] [phone] [place]. [address] [place], ACCESS LINE: (902)461-4747 or (617) 500-2278, [employer]za  Dental Assistance  If unable to pay or uninsured, contact:  [facility] or [facility]. to become qualified for the adult dental clinic.  Patients with Medicaid: [facility] [phone] [address], [phone] [address], [phone]  If unable to pay, or uninsured, contact [facility] [phone]) or [facility] Department [phone] in [place], [phone] in [facility]) to become qualified for the adult dental clinic  Other Low-Cost Community Dental  Services: - [facility]- [address] [place], [place], [place], [address], [phone], Ext. [phone], 2nd and 4th Thursday of the month at 6:30am.  10 clients each day by appointment, can sometimes see walk-in patients if someone does not show for an appointment. [facility]- [address] [place], [place], [address], [phone] - [facility]- [address] [place], [place], [address], [phone] - [facility]- [phone] [facility] Health Department- [phone] [facility] [phone] -
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
## 2012-06-02 NOTE — ED Notes (Signed)
ION:[id number]<MW> Expected date:06/02/12<BR> Expected time:10:42 AM<BR> Means of arrival:Ambulance<BR> Comments:<BR> N/V/D x 4 days

## 2012-06-03 ENCOUNTER — Encounter (HOSPITAL_COMMUNITY): Payer: Self-pay | Admitting: *Deleted

## 2012-06-03 DIAGNOSIS — Z79899 Other long term (current) drug therapy: Secondary | ICD-10-CM | POA: Insufficient documentation

## 2012-06-03 DIAGNOSIS — R0682 Tachypnea, not elsewhere classified: Secondary | ICD-10-CM | POA: Insufficient documentation

## 2012-06-03 DIAGNOSIS — R61 Generalized hyperhidrosis: Secondary | ICD-10-CM | POA: Insufficient documentation

## 2012-06-03 DIAGNOSIS — R109 Unspecified abdominal pain: Secondary | ICD-10-CM | POA: Insufficient documentation

## 2012-06-03 LAB — URINE CULTURE
Colony Count: NO GROWTH
Culture: NO GROWTH

## 2012-06-03 NOTE — ED Notes (Signed)
Pt. came to [facility] and said he was told to come here on his last visit to the ED on 03/09/12 to make an appointment. I told him we don't schedule appointments, we are a walk-in clinic. Pt. has the orange card but it has expired.  He used to go to [facility] but they aren't taking new patients. I told him we have a [employer] representative that that is here every Fri. Won't be here this Fri. due to the holiday. She will be back on 7/12.  Pt. instructed to renew card then and she will direct him where to go for his care. He can come here to be seen until he gets a doctor.Vassie Moselle 06/03/2012

## 2012-06-03 NOTE — ED Notes (Signed)
The pt is c/o mid-abd pain for 3 days.  He was seen here last pm for the same with nv

## 2012-06-04 ENCOUNTER — Encounter (HOSPITAL_COMMUNITY): Payer: Self-pay | Admitting: Emergency Medicine

## 2012-06-04 ENCOUNTER — Emergency Department (HOSPITAL_COMMUNITY)
Admission: EM | Admit: 2012-06-04 | Discharge: 2012-06-04 | Disposition: A | Discharge status: Home / Self Care | Payer: Self-pay | Attending: Emergency Medicine | Admitting: Emergency Medicine

## 2012-06-04 DIAGNOSIS — R109 Unspecified abdominal pain: Secondary | ICD-10-CM

## 2012-06-04 LAB — CBC WITH DIFFERENTIAL/PLATELET
Basophils Absolute: 0 10*3/uL (ref 0.0–0.1)
Basophils Relative: 0 % (ref 0–1)
Eosinophils Absolute: 0.1 10*3/uL (ref 0.0–0.7)
Eosinophils Relative: 1 % (ref 0–5)
HCT: 41 % (ref 39.0–52.0)
Hemoglobin: 14.2 g/dL (ref 13.0–17.0)
Lymphocytes Relative: 43 % (ref 12–46)
Lymphs Abs: 4.8 10*3/uL — ABNORMAL HIGH (ref 0.7–4.0)
MCH: 31.3 pg (ref 26.0–34.0)
MCHC: 34.6 g/dL (ref 30.0–36.0)
MCV: 90.5 fL (ref 78.0–100.0)
Monocytes Absolute: 0.7 10*3/uL (ref 0.1–1.0)
Monocytes Relative: 6 % (ref 3–12)
Neutro Abs: 5.5 10*3/uL (ref 1.7–7.7)
Neutrophils Relative %: 49 % (ref 43–77)
Platelets: 221 10*3/uL (ref 150–400)
RBC: 4.53 MIL/uL (ref 4.22–5.81)
RDW: 13.6 % (ref 11.5–15.5)
WBC: 11 10*3/uL — ABNORMAL HIGH (ref 4.0–10.5)

## 2012-06-04 LAB — URINALYSIS, ROUTINE W REFLEX MICROSCOPIC
Glucose, UA: NEGATIVE mg/dL
Hgb urine dipstick: NEGATIVE
Ketones, ur: 15 mg/dL — AB
Nitrite: POSITIVE — AB
Protein, ur: 30 mg/dL — AB
Specific Gravity, Urine: 1.039 — ABNORMAL HIGH (ref 1.005–1.030)
Urobilinogen, UA: 1 mg/dL (ref 0.0–1.0)
pH: 5.5 (ref 5.0–8.0)

## 2012-06-04 LAB — COMPREHENSIVE METABOLIC PANEL
ALT: 32 U/L (ref 0–53)
AST: 32 U/L (ref 0–37)
Albumin: 4 g/dL (ref 3.5–5.2)
Alkaline Phosphatase: 80 U/L (ref 39–117)
BUN: 10 mg/dL (ref 6–23)
CO2: 22 mEq/L (ref 19–32)
Calcium: 9.5 mg/dL (ref 8.4–10.5)
Chloride: 106 mEq/L (ref 96–112)
Creatinine, Ser: 1.12 mg/dL (ref 0.50–1.35)
GFR calc Af Amer: 88 mL/min — ABNORMAL LOW (ref >90)
GFR calc non Af Amer: 76 mL/min — ABNORMAL LOW (ref >90)
Glucose, Bld: 146 mg/dL — ABNORMAL HIGH (ref 70–99)
Potassium: 3.4 mEq/L — ABNORMAL LOW (ref 3.5–5.1)
Sodium: 141 mEq/L (ref 135–145)
Total Bilirubin: 0.6 mg/dL (ref 0.3–1.2)
Total Protein: 7.7 g/dL (ref 6.0–8.3)

## 2012-06-04 LAB — URINE MICROSCOPIC-ADD ON

## 2012-06-04 LAB — LIPASE, BLOOD: Lipase: 27 U/L (ref 11–59)

## 2012-06-04 LAB — LACTIC ACID, PLASMA: Lactic Acid, Venous: 1.7 mmol/L (ref 0.5–2.2)

## 2012-06-04 MED ORDER — ONDANSETRON 8 MG/NS 50 ML IVPB
8.0000 mg | Freq: Once | INTRAVENOUS | Status: AC
  Administered 2012-06-04: 8 mg via INTRAVENOUS
  Filled 2012-06-04: qty 8

## 2012-06-04 MED ORDER — PANTOPRAZOLE SODIUM 40 MG PO TBEC: 40.0000 mg | DELAYED_RELEASE_TABLET | Freq: Every day | ORAL | Status: DC

## 2012-06-04 MED ORDER — HYDROMORPHONE HCL PF 1 MG/ML IJ SOLN
1.0000 mg | Freq: Once | INTRAMUSCULAR | Status: AC
  Administered 2012-06-04: 1 mg via INTRAVENOUS
  Filled 2012-06-04: qty 1

## 2012-06-04 MED ORDER — METOCLOPRAMIDE HCL 10 MG PO TABS: 10.0000 mg | ORAL_TABLET | Freq: Four times a day (QID) | ORAL | Status: DC

## 2012-06-04 MED ORDER — ONDANSETRON HCL 4 MG/2ML IJ SOLN
INTRAMUSCULAR | Status: AC
  Administered 2012-06-04: 8 mg
  Filled 2012-06-04: qty 4

## 2012-06-04 NOTE — ED Notes (Signed)
Pt requested warm blanket. Provided warm blanket - temp 99

## 2012-06-04 NOTE — ED Notes (Signed)
CORRECTION:  THE PATIENT RECEIVED ZOFRAN 8 MG, IV X 1 (NOT X 2).
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
## 2012-06-04 NOTE — ED Notes (Addendum)
Pt aware that we need to collect urine specimen. Urinal provided. 

## 2012-06-04 NOTE — ED Notes (Signed)
The patient is AOx4 and comfortable with his discharge instructions.  The patient's ride is here to drive him home.
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
## 2012-06-29 ENCOUNTER — Emergency Department (HOSPITAL_COMMUNITY)
Admission: EM | Admit: 2012-06-29 | Discharge: 2012-06-29 | Disposition: A | Discharge status: Home / Self Care | Payer: Self-pay | Attending: Emergency Medicine | Admitting: Emergency Medicine

## 2012-06-29 ENCOUNTER — Encounter (HOSPITAL_COMMUNITY): Payer: Self-pay | Admitting: Emergency Medicine

## 2012-06-29 ENCOUNTER — Emergency Department (HOSPITAL_COMMUNITY): Payer: Self-pay

## 2012-06-29 DIAGNOSIS — N390 Urinary tract infection, site not specified: Secondary | ICD-10-CM

## 2012-06-29 DIAGNOSIS — I959 Hypotension, unspecified: Secondary | ICD-10-CM

## 2012-06-29 DIAGNOSIS — R1031 Right lower quadrant pain: Secondary | ICD-10-CM | POA: Insufficient documentation

## 2012-06-29 DIAGNOSIS — R112 Nausea with vomiting, unspecified: Secondary | ICD-10-CM | POA: Insufficient documentation

## 2012-06-29 DIAGNOSIS — Z9089 Acquired absence of other organs: Secondary | ICD-10-CM | POA: Insufficient documentation

## 2012-06-29 LAB — DIFFERENTIAL
Basophils Absolute: 0 10*3/uL (ref 0.0–0.1)
Basophils Relative: 0 % (ref 0–1)
Eosinophils Absolute: 0 10*3/uL (ref 0.0–0.7)
Eosinophils Relative: 0 % (ref 0–5)
Lymphocytes Relative: 25 % (ref 12–46)
Lymphs Abs: 2 10*3/uL (ref 0.7–4.0)
Monocytes Absolute: 0.5 10*3/uL (ref 0.1–1.0)
Monocytes Relative: 7 % (ref 3–12)
Neutro Abs: 5.4 10*3/uL (ref 1.7–7.7)
Neutrophils Relative %: 68 % (ref 43–77)

## 2012-06-29 LAB — COMPREHENSIVE METABOLIC PANEL
ALT: 23 U/L (ref 0–53)
AST: 28 U/L (ref 0–37)
Albumin: 4.3 g/dL (ref 3.5–5.2)
Alkaline Phosphatase: 73 U/L (ref 39–117)
BUN: 10 mg/dL (ref 6–23)
CO2: 22 mEq/L (ref 19–32)
Calcium: 9.4 mg/dL (ref 8.4–10.5)
Chloride: 107 mEq/L (ref 96–112)
Creatinine, Ser: 0.94 mg/dL (ref 0.50–1.35)
GFR calc Af Amer: >90 mL/min (ref >90)
GFR calc non Af Amer: >90 mL/min (ref >90)
Glucose, Bld: 115 mg/dL — ABNORMAL HIGH (ref 70–99)
Potassium: 3.6 mEq/L (ref 3.5–5.1)
Sodium: 143 mEq/L (ref 135–145)
Total Bilirubin: 0.5 mg/dL (ref 0.3–1.2)
Total Protein: 8.1 g/dL (ref 6.0–8.3)

## 2012-06-29 LAB — URINE MICROSCOPIC-ADD ON

## 2012-06-29 LAB — LACTIC ACID, PLASMA: Lactic Acid, Venous: 2 mmol/L (ref 0.5–2.2)

## 2012-06-29 LAB — CBC
HCT: 41.2 % (ref 39.0–52.0)
Hemoglobin: 14.3 g/dL (ref 13.0–17.0)
MCH: 31.4 pg (ref 26.0–34.0)
MCHC: 34.7 g/dL (ref 30.0–36.0)
MCV: 90.5 fL (ref 78.0–100.0)
Platelets: 199 10*3/uL (ref 150–400)
RBC: 4.55 MIL/uL (ref 4.22–5.81)
RDW: 13 % (ref 11.5–15.5)
WBC: 8.1 10*3/uL (ref 4.0–10.5)

## 2012-06-29 LAB — OCCULT BLOOD, POC DEVICE: Fecal Occult Bld: NEGATIVE

## 2012-06-29 LAB — URINALYSIS, ROUTINE W REFLEX MICROSCOPIC
Glucose, UA: NEGATIVE mg/dL
Hgb urine dipstick: NEGATIVE
Ketones, ur: 15 mg/dL — AB
Nitrite: POSITIVE — AB
Protein, ur: 30 mg/dL — AB
Specific Gravity, Urine: 1.041 — ABNORMAL HIGH (ref 1.005–1.030)
Urobilinogen, UA: 1 mg/dL (ref 0.0–1.0)
pH: 5.5 (ref 5.0–8.0)

## 2012-06-29 LAB — LIPASE, BLOOD: Lipase: 34 U/L (ref 11–59)

## 2012-06-29 MED ORDER — LORAZEPAM 2 MG/ML IJ SOLN
2.0000 mg | Freq: Once | INTRAMUSCULAR | Status: AC
  Administered 2012-06-29: 2 mg via INTRAVENOUS
  Filled 2012-06-29: qty 1

## 2012-06-29 MED ORDER — ONDANSETRON HCL 4 MG/2ML IJ SOLN
4.0000 mg | Freq: Once | INTRAMUSCULAR | Status: AC
  Administered 2012-06-29: 4 mg via INTRAVENOUS
  Filled 2012-06-29: qty 2

## 2012-06-29 MED ORDER — SODIUM CHLORIDE 0.9 % IV BOLUS (SEPSIS): 1000.0000 mL | Freq: Once | INTRAVENOUS | Status: DC

## 2012-06-29 MED ORDER — ONDANSETRON HCL 4 MG/2ML IJ SOLN
INTRAMUSCULAR | Status: AC
  Filled 2012-06-29: qty 2

## 2012-06-29 MED ORDER — PHENAZOPYRIDINE HCL 200 MG PO TABS: 200.0000 mg | ORAL_TABLET | Freq: Three times a day (TID) | ORAL | Status: AC

## 2012-06-29 MED ORDER — DEXTROSE 5 % IV SOLN
1.0000 g | Freq: Once | INTRAVENOUS | Status: AC
  Administered 2012-06-29: 1 g via INTRAVENOUS
  Filled 2012-06-29: qty 10

## 2012-06-29 MED ORDER — PROCHLORPERAZINE EDISYLATE 5 MG/ML IJ SOLN
10.0000 mg | Freq: Four times a day (QID) | INTRAMUSCULAR | Status: DC | PRN
Start: 2012-06-29 — End: 2012-06-29
  Administered 2012-06-29: 10 mg via INTRAVENOUS
  Filled 2012-06-29: qty 2

## 2012-06-29 MED ORDER — SODIUM CHLORIDE 0.9 % IV BOLUS (SEPSIS)
1000.0000 mL | Freq: Once | INTRAVENOUS | Status: AC
  Administered 2012-06-29: 1000 mL via INTRAVENOUS

## 2012-06-29 MED ORDER — PHENAZOPYRIDINE HCL 100 MG PO TABS: 95.0000 mg | ORAL_TABLET | Freq: Once | ORAL | Status: DC

## 2012-06-29 MED ORDER — CEPHALEXIN 500 MG PO CAPS: 500.0000 mg | ORAL_CAPSULE | Freq: Three times a day (TID) | ORAL | Status: AC

## 2012-06-29 NOTE — ED Notes (Signed)
Pt has a urinal and knows we need a urine sample from him 

## 2012-06-29 NOTE — ED Notes (Addendum)
Received pt via EMS with c/o RLQ abdominal pain with N/V. Last PO intake last night. Pt reported to EMS that "Dilaudid helps." Pt yelling in pain intermittently, reports pain comes and goes. Pt reports taken 800mg  of tylenol this morning. Given 4mg  of Zofran by EMS.

## 2012-06-29 NOTE — ED Provider Notes (Signed)
Pt placed in the CDU for fluid bolus to correct blood pressure abnormality. The patient is a chronic abdominal pain patient and their are concerns about him being drug seeking as well. The patients BP has been between 80-90 systolic. 2L fluid bolus have been given in the ED and the patients blood pressure is 130/80 now and his no longer having orthostatic dizziness when ambulating. According to lab work, patient has a mild UTI. Cultures have been sent out, 1g IV rocephin given in ED. He will be DC'd with Pyridium and Keflex prescription. Patient continues to request Dilaudid and declines all other pain medication offers.  Pt has been advised of the symptoms that warrant their return to the ED. Patient has voiced understanding and has agreed to follow-up with the PCP or specialist.   Dorthula Matas, PA 06/29/12 1702  Dorthula Matas, PA 06/29/12 1610

## 2012-06-29 NOTE — ED Notes (Signed)
Pt would not sign e-signature pad.  Pt ambulatory at d/c.  Pt rec'd d/c instructions and Rx.

## 2012-06-29 NOTE — ED Provider Notes (Signed)
[name] is a 46 y.o. male with chronic abdominal pain, and drug-seeking behavior. He's had multiple evaluations for abdominal pain in the last 6 months. Patient is is anxious, and has been resistant to usual Emergency department treatment protocols.   Medical screening examination/treatment/procedure(s) were conducted as a shared visit with non-physician practitioner(s) and myself.  I personally evaluated the patient during the encounter  Flint Melter, MD 06/30/12 2117
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
## 2012-06-30 LAB — URINE CULTURE: Colony Count: 65000

## 2012-06-30 NOTE — ED Provider Notes (Signed)
Medical screening examination/treatment/procedure(s) were conducted as a shared visit with non-physician practitioner(s) and myself.  I personally evaluated the patient during the encounter  Flint Melter, MD 06/30/12 2119

## 2012-06-30 NOTE — ED Provider Notes (Signed)
Medical screening examination/treatment/procedure(s) were conducted as a shared visit with non-physician practitioner(s) and myself.  I personally evaluated the patient during the encounter  Flint Melter, MD 06/30/12 2118

## 2012-07-01 ENCOUNTER — Emergency Department (HOSPITAL_COMMUNITY)
Admission: EM | Admit: 2012-07-01 | Discharge: 2012-07-01 | Disposition: A | Discharge status: Home / Self Care | Payer: Self-pay | Attending: Emergency Medicine | Admitting: Emergency Medicine

## 2012-07-01 DIAGNOSIS — G8929 Other chronic pain: Secondary | ICD-10-CM | POA: Insufficient documentation

## 2012-07-01 DIAGNOSIS — R109 Unspecified abdominal pain: Secondary | ICD-10-CM | POA: Insufficient documentation

## 2012-07-01 DIAGNOSIS — R112 Nausea with vomiting, unspecified: Secondary | ICD-10-CM | POA: Insufficient documentation

## 2012-07-01 DIAGNOSIS — Z9089 Acquired absence of other organs: Secondary | ICD-10-CM | POA: Insufficient documentation

## 2012-07-01 DIAGNOSIS — R197 Diarrhea, unspecified: Secondary | ICD-10-CM | POA: Insufficient documentation

## 2012-07-01 LAB — CBC WITH DIFFERENTIAL/PLATELET
Basophils Absolute: 0 10*3/uL (ref 0.0–0.1)
Basophils Relative: 0 % (ref 0–1)
Eosinophils Absolute: 0 10*3/uL (ref 0.0–0.7)
Eosinophils Relative: 1 % (ref 0–5)
HCT: 39.9 % (ref 39.0–52.0)
Hemoglobin: 13.8 g/dL (ref 13.0–17.0)
Lymphocytes Relative: 35 % (ref 12–46)
Lymphs Abs: 2.9 10*3/uL (ref 0.7–4.0)
MCH: 31.2 pg (ref 26.0–34.0)
MCHC: 34.6 g/dL (ref 30.0–36.0)
MCV: 90.3 fL (ref 78.0–100.0)
Monocytes Absolute: 0.6 10*3/uL (ref 0.1–1.0)
Monocytes Relative: 8 % (ref 3–12)
Neutro Abs: 4.8 10*3/uL (ref 1.7–7.7)
Neutrophils Relative %: 57 % (ref 43–77)
Platelets: 220 10*3/uL (ref 150–400)
RBC: 4.42 MIL/uL (ref 4.22–5.81)
RDW: 12.7 % (ref 11.5–15.5)
WBC: 8.4 10*3/uL (ref 4.0–10.5)

## 2012-07-01 LAB — COMPREHENSIVE METABOLIC PANEL
ALT: 22 U/L (ref 0–53)
AST: 42 U/L — ABNORMAL HIGH (ref 0–37)
Albumin: 4 g/dL (ref 3.5–5.2)
Alkaline Phosphatase: 68 U/L (ref 39–117)
BUN: 8 mg/dL (ref 6–23)
CO2: 23 mEq/L (ref 19–32)
Calcium: 9.1 mg/dL (ref 8.4–10.5)
Chloride: 99 mEq/L (ref 96–112)
Creatinine, Ser: 1.01 mg/dL (ref 0.50–1.35)
GFR calc Af Amer: >90 mL/min (ref >90)
GFR calc non Af Amer: 86 mL/min — ABNORMAL LOW (ref >90)
Glucose, Bld: 113 mg/dL — ABNORMAL HIGH (ref 70–99)
Potassium: 3.3 mEq/L — ABNORMAL LOW (ref 3.5–5.1)
Sodium: 135 mEq/L (ref 135–145)
Total Bilirubin: 0.5 mg/dL (ref 0.3–1.2)
Total Protein: 7.1 g/dL (ref 6.0–8.3)

## 2012-07-01 LAB — LIPASE, BLOOD: Lipase: 26 U/L (ref 11–59)

## 2012-07-01 LAB — URINE MICROSCOPIC-ADD ON

## 2012-07-01 LAB — URINALYSIS, ROUTINE W REFLEX MICROSCOPIC
Glucose, UA: NEGATIVE mg/dL
Hgb urine dipstick: NEGATIVE
Ketones, ur: 40 mg/dL — AB
Nitrite: NEGATIVE
Protein, ur: 30 mg/dL — AB
Specific Gravity, Urine: 1.044 — ABNORMAL HIGH (ref 1.005–1.030)
Urobilinogen, UA: 0.2 mg/dL (ref 0.0–1.0)
pH: 5.5 (ref 5.0–8.0)

## 2012-07-01 MED ORDER — ONDANSETRON HCL 4 MG/2ML IJ SOLN
4.0000 mg | Freq: Once | INTRAMUSCULAR | Status: AC
  Administered 2012-07-01: 4 mg via INTRAVENOUS
  Filled 2012-07-01: qty 2

## 2012-07-01 MED ORDER — SODIUM CHLORIDE 0.9 % IV SOLN
1000.0000 mL | INTRAVENOUS | Status: DC
  Administered 2012-07-01: 1000 mL via INTRAVENOUS

## 2012-07-01 MED ORDER — SODIUM CHLORIDE 0.9 % IV SOLN
1000.0000 mL | Freq: Once | INTRAVENOUS | Status: AC
  Administered 2012-07-01: 1000 mL via INTRAVENOUS

## 2012-07-01 MED ORDER — HYDROMORPHONE HCL PF 1 MG/ML IJ SOLN
1.0000 mg | Freq: Once | INTRAMUSCULAR | Status: AC
  Administered 2012-07-01: 1 mg via INTRAVENOUS
  Filled 2012-07-01: qty 1

## 2012-07-01 MED ORDER — KETOROLAC TROMETHAMINE 30 MG/ML IJ SOLN
30.0000 mg | Freq: Once | INTRAMUSCULAR | Status: AC
  Administered 2012-07-01: 30 mg via INTRAVENOUS
  Filled 2012-07-01: qty 1

## 2012-07-01 NOTE — ED Notes (Signed)
MD at bedside. 

## 2012-07-01 NOTE — ED Notes (Signed)
Pt given a bus pass and number for pain clinic
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
## 2012-07-01 NOTE — ED Provider Notes (Signed)
Medical screening examination/treatment/procedure(s) were performed by non-physician practitioner and as supervising physician I was immediately available for consultation/collaboration.   Analyssa Downs R Silva Aamodt, MD 07/01/12 1549 

## 2012-07-01 NOTE — ED Notes (Signed)
Patient states that he was recently dx with UTI- taking cipro. Suddenly started having extreme abdominal pain , c/o nausea and vomitting patient is currently moaning and groaning.
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
## 2012-07-28 ENCOUNTER — Emergency Department (HOSPITAL_COMMUNITY)
Admission: EM | Admit: 2012-07-28 | Discharge: 2012-07-28 | Discharge status: Against Medical Advice | Payer: Self-pay | Attending: Emergency Medicine | Admitting: Emergency Medicine

## 2012-07-28 ENCOUNTER — Encounter (HOSPITAL_COMMUNITY): Payer: Self-pay | Admitting: Emergency Medicine

## 2012-07-28 DIAGNOSIS — Z0389 Encounter for observation for other suspected diseases and conditions ruled out: Secondary | ICD-10-CM | POA: Insufficient documentation

## 2012-07-28 LAB — CBC WITH DIFFERENTIAL/PLATELET
Basophils Absolute: 0 10*3/uL (ref 0.0–0.1)
Basophils Relative: 0 % (ref 0–1)
Eosinophils Absolute: 0 10*3/uL (ref 0.0–0.7)
Eosinophils Relative: 0 % (ref 0–5)
HCT: 42.3 % (ref 39.0–52.0)
Hemoglobin: 15 g/dL (ref 13.0–17.0)
Lymphocytes Relative: 35 % (ref 12–46)
Lymphs Abs: 2.3 10*3/uL (ref 0.7–4.0)
MCH: 31.8 pg (ref 26.0–34.0)
MCHC: 35.5 g/dL (ref 30.0–36.0)
MCV: 89.8 fL (ref 78.0–100.0)
Monocytes Absolute: 0.4 10*3/uL (ref 0.1–1.0)
Monocytes Relative: 6 % (ref 3–12)
Neutro Abs: 3.8 10*3/uL (ref 1.7–7.7)
Neutrophils Relative %: 59 % (ref 43–77)
Platelets: 194 10*3/uL (ref 150–400)
RBC: 4.71 MIL/uL (ref 4.22–5.81)
RDW: 12.6 % (ref 11.5–15.5)
WBC: 6.5 10*3/uL (ref 4.0–10.5)

## 2012-07-28 LAB — BASIC METABOLIC PANEL
BUN: 10 mg/dL (ref 6–23)
CO2: 25 mEq/L (ref 19–32)
Calcium: 9.7 mg/dL (ref 8.4–10.5)
Chloride: 99 mEq/L (ref 96–112)
Creatinine, Ser: 1.1 mg/dL (ref 0.50–1.35)
GFR calc Af Amer: >90 mL/min (ref >90)
GFR calc non Af Amer: 78 mL/min — ABNORMAL LOW (ref >90)
Glucose, Bld: 118 mg/dL — ABNORMAL HIGH (ref 70–99)
Potassium: 4 mEq/L (ref 3.5–5.1)
Sodium: 135 mEq/L (ref 135–145)

## 2012-07-28 LAB — LIPASE, BLOOD: Lipase: 37 U/L (ref 11–59)

## 2012-07-28 NOTE — ED Notes (Signed)
Pt c/o right sided mid abd pain x 2 days with N/V/D; pt sts hx of same and seen here for same multiple times in past

## 2012-08-12 ENCOUNTER — Encounter (HOSPITAL_COMMUNITY): Payer: Self-pay | Admitting: *Deleted

## 2012-08-12 DIAGNOSIS — F17200 Nicotine dependence, unspecified, uncomplicated: Secondary | ICD-10-CM | POA: Insufficient documentation

## 2012-08-12 DIAGNOSIS — K859 Acute pancreatitis without necrosis or infection, unspecified: Secondary | ICD-10-CM | POA: Insufficient documentation

## 2012-08-12 DIAGNOSIS — R109 Unspecified abdominal pain: Secondary | ICD-10-CM | POA: Insufficient documentation

## 2012-08-12 DIAGNOSIS — G8929 Other chronic pain: Secondary | ICD-10-CM | POA: Insufficient documentation

## 2012-08-12 LAB — URINE MICROSCOPIC-ADD ON

## 2012-08-12 LAB — URINALYSIS, ROUTINE W REFLEX MICROSCOPIC
Glucose, UA: NEGATIVE mg/dL
Hgb urine dipstick: NEGATIVE
Ketones, ur: 15 mg/dL — AB
Nitrite: POSITIVE — AB
Protein, ur: 100 mg/dL — AB
Specific Gravity, Urine: >1.046 — ABNORMAL HIGH (ref 1.005–1.030)
Urobilinogen, UA: 1 mg/dL (ref 0.0–1.0)
pH: 5 (ref 5.0–8.0)

## 2012-08-12 NOTE — ED Notes (Signed)
The pt is c/o abd pain nv and no diarrhea for 3 days.  He has had numerus visits here for the same.  He reports he was sent to a pain clinic but there were complications.

## 2012-08-13 ENCOUNTER — Emergency Department (HOSPITAL_COMMUNITY): Payer: Self-pay

## 2012-08-13 ENCOUNTER — Emergency Department (HOSPITAL_COMMUNITY)
Admission: EM | Admit: 2012-08-13 | Discharge: 2012-08-13 | Disposition: A | Discharge status: Home / Self Care | Payer: Self-pay | Attending: Emergency Medicine | Admitting: Emergency Medicine

## 2012-08-13 DIAGNOSIS — G8929 Other chronic pain: Secondary | ICD-10-CM

## 2012-08-13 LAB — CBC WITH DIFFERENTIAL/PLATELET
Basophils Absolute: 0 10*3/uL (ref 0.0–0.1)
Basophils Relative: 0 % (ref 0–1)
Eosinophils Absolute: 0 10*3/uL (ref 0.0–0.7)
Eosinophils Relative: 1 % (ref 0–5)
HCT: 47.7 % (ref 39.0–52.0)
Hemoglobin: 16.6 g/dL (ref 13.0–17.0)
Lymphocytes Relative: 38 % (ref 12–46)
Lymphs Abs: 3.2 10*3/uL (ref 0.7–4.0)
MCH: 31.3 pg (ref 26.0–34.0)
MCHC: 34.8 g/dL (ref 30.0–36.0)
MCV: 90 fL (ref 78.0–100.0)
Monocytes Absolute: 0.7 10*3/uL (ref 0.1–1.0)
Monocytes Relative: 8 % (ref 3–12)
Neutro Abs: 4.7 10*3/uL (ref 1.7–7.7)
Neutrophils Relative %: 54 % (ref 43–77)
Platelets: 216 10*3/uL (ref 150–400)
RBC: 5.3 MIL/uL (ref 4.22–5.81)
RDW: 12.9 % (ref 11.5–15.5)
WBC: 8.6 10*3/uL (ref 4.0–10.5)

## 2012-08-13 LAB — COMPREHENSIVE METABOLIC PANEL
ALT: 43 U/L (ref 0–53)
AST: 33 U/L (ref 0–37)
Albumin: 5 g/dL (ref 3.5–5.2)
Alkaline Phosphatase: 80 U/L (ref 39–117)
BUN: 11 mg/dL (ref 6–23)
CO2: 27 mEq/L (ref 19–32)
Calcium: 10.6 mg/dL — ABNORMAL HIGH (ref 8.4–10.5)
Chloride: 99 mEq/L (ref 96–112)
Creatinine, Ser: 1.17 mg/dL (ref 0.50–1.35)
GFR calc Af Amer: 84 mL/min — ABNORMAL LOW (ref >90)
GFR calc non Af Amer: 72 mL/min — ABNORMAL LOW (ref >90)
Glucose, Bld: 93 mg/dL (ref 70–99)
Potassium: 4.4 mEq/L (ref 3.5–5.1)
Sodium: 138 mEq/L (ref 135–145)
Total Bilirubin: 0.5 mg/dL (ref 0.3–1.2)
Total Protein: 8.8 g/dL — ABNORMAL HIGH (ref 6.0–8.3)

## 2012-08-13 LAB — LIPASE, BLOOD: Lipase: 30 U/L (ref 11–59)

## 2012-08-13 MED ORDER — ONDANSETRON HCL 4 MG/2ML IJ SOLN
4.0000 mg | Freq: Once | INTRAMUSCULAR | Status: AC
  Administered 2012-08-13: 4 mg via INTRAVENOUS
  Filled 2012-08-13: qty 2

## 2012-08-13 MED ORDER — KETOROLAC TROMETHAMINE 30 MG/ML IJ SOLN
30.0000 mg | Freq: Once | INTRAMUSCULAR | Status: AC
  Administered 2012-08-13: 30 mg via INTRAVENOUS
  Filled 2012-08-13: qty 1

## 2012-08-13 MED ORDER — DICYCLOMINE HCL 10 MG/ML IM SOLN
20.0000 mg | Freq: Once | INTRAMUSCULAR | Status: AC
  Administered 2012-08-13: 20 mg via INTRAMUSCULAR
  Filled 2012-08-13: qty 2

## 2012-08-13 NOTE — ED Notes (Signed)
Awake and yelling out in pain, writhing all over the bed.  Requesting to see NP.  Earley Favor notified of same.

## 2012-08-13 NOTE — ED Provider Notes (Signed)
Medical screening examination/treatment/procedure(s) were performed by non-physician practitioner and as supervising physician I was immediately available for consultation/collaboration.  Acie Custis, MD 08/13/12 0548 

## 2012-08-13 NOTE — ED Notes (Signed)
Pt writhing out in pain states his pain beganion Friday in RLQ , nonradiating associated with nausea, vomiting and diarrhea..  Pt verbally abusive,  Unable to assess pt because of his yelling, jumping in and out of bed,.
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
## 2012-08-13 NOTE — ED Notes (Signed)
Advised of wait time. 
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
## 2012-08-22 ENCOUNTER — Emergency Department (HOSPITAL_COMMUNITY)
Admission: EM | Admit: 2012-08-22 | Discharge: 2012-08-22 | Disposition: A | Discharge status: Home / Self Care | Payer: Self-pay | Attending: Emergency Medicine | Admitting: Emergency Medicine

## 2012-08-22 ENCOUNTER — Encounter (HOSPITAL_COMMUNITY): Payer: Self-pay | Admitting: Emergency Medicine

## 2012-08-22 DIAGNOSIS — R109 Unspecified abdominal pain: Secondary | ICD-10-CM

## 2012-08-22 DIAGNOSIS — G8929 Other chronic pain: Secondary | ICD-10-CM | POA: Insufficient documentation

## 2012-08-22 LAB — CBC WITH DIFFERENTIAL/PLATELET
Basophils Absolute: 0 10*3/uL (ref 0.0–0.1)
Basophils Relative: 0 % (ref 0–1)
Eosinophils Absolute: 0 10*3/uL (ref 0.0–0.7)
Eosinophils Relative: 0 % (ref 0–5)
HCT: 42.7 % (ref 39.0–52.0)
Hemoglobin: 14.5 g/dL (ref 13.0–17.0)
Lymphocytes Relative: 38 % (ref 12–46)
Lymphs Abs: 2.8 10*3/uL (ref 0.7–4.0)
MCH: 30.7 pg (ref 26.0–34.0)
MCHC: 34 g/dL (ref 30.0–36.0)
MCV: 90.3 fL (ref 78.0–100.0)
Monocytes Absolute: 0.4 10*3/uL (ref 0.1–1.0)
Monocytes Relative: 5 % (ref 3–12)
Neutro Abs: 4.2 10*3/uL (ref 1.7–7.7)
Neutrophils Relative %: 57 % (ref 43–77)
Platelets: 198 10*3/uL (ref 150–400)
RBC: 4.73 MIL/uL (ref 4.22–5.81)
RDW: 13.1 % (ref 11.5–15.5)
WBC: 7.5 10*3/uL (ref 4.0–10.5)

## 2012-08-22 LAB — COMPREHENSIVE METABOLIC PANEL
ALT: 52 U/L (ref 0–53)
AST: 29 U/L (ref 0–37)
Albumin: 3.8 g/dL (ref 3.5–5.2)
Alkaline Phosphatase: 60 U/L (ref 39–117)
BUN: 7 mg/dL (ref 6–23)
CO2: 21 mEq/L (ref 19–32)
Calcium: 9.3 mg/dL (ref 8.4–10.5)
Chloride: 106 mEq/L (ref 96–112)
Creatinine, Ser: 1.01 mg/dL (ref 0.50–1.35)
GFR calc Af Amer: >90 mL/min (ref >90)
GFR calc non Af Amer: 86 mL/min — ABNORMAL LOW (ref >90)
Glucose, Bld: 88 mg/dL (ref 70–99)
Potassium: 4.5 mEq/L (ref 3.5–5.1)
Sodium: 138 mEq/L (ref 135–145)
Total Bilirubin: 0.4 mg/dL (ref 0.3–1.2)
Total Protein: 6.9 g/dL (ref 6.0–8.3)

## 2012-08-22 LAB — LIPASE, BLOOD: Lipase: 25 U/L (ref 11–59)

## 2012-08-22 LAB — LACTIC ACID, PLASMA: Lactic Acid, Venous: 1.9 mmol/L (ref 0.5–2.2)

## 2012-08-22 MED ORDER — TRAMADOL HCL 50 MG PO TABS
50.0000 mg | ORAL_TABLET | Freq: Once | ORAL | Status: AC
  Administered 2012-08-22: 50 mg via ORAL
  Filled 2012-08-22: qty 1

## 2012-08-22 MED ORDER — SODIUM CHLORIDE 0.9 % IV BOLUS (SEPSIS)
1000.0000 mL | Freq: Once | INTRAVENOUS | Status: AC
  Administered 2012-08-22: 1000 mL via INTRAVENOUS

## 2012-08-22 MED ORDER — ONDANSETRON HCL 4 MG/2ML IJ SOLN
INTRAMUSCULAR | Status: AC
  Filled 2012-08-22: qty 2

## 2012-08-22 MED ORDER — DICYCLOMINE HCL 10 MG/ML IM SOLN
20.0000 mg | Freq: Once | INTRAMUSCULAR | Status: AC
  Administered 2012-08-22: 20 mg via INTRAMUSCULAR
  Filled 2012-08-22: qty 2

## 2012-08-22 MED ORDER — KETOROLAC TROMETHAMINE 30 MG/ML IJ SOLN
30.0000 mg | Freq: Once | INTRAMUSCULAR | Status: AC
  Administered 2012-08-22: 30 mg via INTRAVENOUS
  Filled 2012-08-22: qty 1

## 2012-08-22 MED ORDER — HYDROMORPHONE HCL PF 1 MG/ML IJ SOLN
1.0000 mg | Freq: Once | INTRAMUSCULAR | Status: AC
  Administered 2012-08-22: 1 mg via INTRAMUSCULAR
  Filled 2012-08-22: qty 1

## 2012-08-22 MED ORDER — ONDANSETRON HCL 4 MG/2ML IJ SOLN
4.0000 mg | Freq: Once | INTRAMUSCULAR | Status: AC
  Administered 2012-08-22: 4 mg via INTRAVENOUS
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
## 2012-08-22 NOTE — ED Notes (Signed)
MD at bedside. 

## 2012-08-22 NOTE — ED Notes (Signed)
JYN:[id number]<AO> Expected date:<BR> Expected time:10:55 AM<BR> Means of arrival:Ambulance<BR> Comments:<BR> 46yoM-Lower abd pain
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
## 2012-08-22 NOTE — ED Notes (Signed)
IV attempted x2 by 2 clinicians.
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
## 2012-10-04 ENCOUNTER — Emergency Department (HOSPITAL_COMMUNITY)
Admission: EM | Admit: 2012-10-04 | Discharge: 2012-10-04 | Disposition: A | Discharge status: Home / Self Care | Payer: Self-pay | Attending: Emergency Medicine | Admitting: Emergency Medicine

## 2012-10-04 ENCOUNTER — Emergency Department (HOSPITAL_COMMUNITY): Payer: Self-pay

## 2012-10-04 ENCOUNTER — Encounter (HOSPITAL_COMMUNITY): Payer: Self-pay | Admitting: *Deleted

## 2012-10-04 DIAGNOSIS — G8929 Other chronic pain: Secondary | ICD-10-CM

## 2012-10-04 DIAGNOSIS — R112 Nausea with vomiting, unspecified: Secondary | ICD-10-CM | POA: Insufficient documentation

## 2012-10-04 DIAGNOSIS — R197 Diarrhea, unspecified: Secondary | ICD-10-CM | POA: Insufficient documentation

## 2012-10-04 DIAGNOSIS — Z888 Allergy status to other drugs, medicaments and biological substances status: Secondary | ICD-10-CM | POA: Insufficient documentation

## 2012-10-04 DIAGNOSIS — F17200 Nicotine dependence, unspecified, uncomplicated: Secondary | ICD-10-CM | POA: Insufficient documentation

## 2012-10-04 DIAGNOSIS — R1084 Generalized abdominal pain: Secondary | ICD-10-CM | POA: Insufficient documentation

## 2012-10-04 DIAGNOSIS — R319 Hematuria, unspecified: Secondary | ICD-10-CM | POA: Insufficient documentation

## 2012-10-04 LAB — URINE MICROSCOPIC-ADD ON

## 2012-10-04 LAB — HEPATIC FUNCTION PANEL
ALT: 30 U/L (ref 0–53)
AST: 25 U/L (ref 0–37)
Albumin: 4.3 g/dL (ref 3.5–5.2)
Alkaline Phosphatase: 63 U/L (ref 39–117)
Bilirubin, Direct: 0.1 mg/dL (ref 0.0–0.3)
Indirect Bilirubin: 0.3 mg/dL (ref 0.3–0.9)
Total Bilirubin: 0.4 mg/dL (ref 0.3–1.2)
Total Protein: 7.6 g/dL (ref 6.0–8.3)

## 2012-10-04 LAB — URINALYSIS, ROUTINE W REFLEX MICROSCOPIC
Glucose, UA: NEGATIVE mg/dL
Ketones, ur: 15 mg/dL — AB
Leukocytes, UA: NEGATIVE
Nitrite: NEGATIVE
Protein, ur: 30 mg/dL — AB
Specific Gravity, Urine: >1.046 — ABNORMAL HIGH (ref 1.005–1.030)
Urobilinogen, UA: 1 mg/dL (ref 0.0–1.0)
pH: 5.5 (ref 5.0–8.0)

## 2012-10-04 LAB — CBC WITH DIFFERENTIAL/PLATELET
Basophils Absolute: 0 10*3/uL (ref 0.0–0.1)
Basophils Relative: 1 % (ref 0–1)
Eosinophils Absolute: 0 10*3/uL (ref 0.0–0.7)
Eosinophils Relative: 0 % (ref 0–5)
HCT: 39.8 % (ref 39.0–52.0)
Hemoglobin: 14.1 g/dL (ref 13.0–17.0)
Lymphocytes Relative: 25 % (ref 12–46)
Lymphs Abs: 2.2 10*3/uL (ref 0.7–4.0)
MCH: 31.3 pg (ref 26.0–34.0)
MCHC: 35.4 g/dL (ref 30.0–36.0)
MCV: 88.4 fL (ref 78.0–100.0)
Monocytes Absolute: 0.7 10*3/uL (ref 0.1–1.0)
Monocytes Relative: 8 % (ref 3–12)
Neutro Abs: 5.9 10*3/uL (ref 1.7–7.7)
Neutrophils Relative %: 67 % (ref 43–77)
Platelets: 201 10*3/uL (ref 150–400)
RBC: 4.5 MIL/uL (ref 4.22–5.81)
RDW: 13.5 % (ref 11.5–15.5)
WBC: 8.8 10*3/uL (ref 4.0–10.5)

## 2012-10-04 LAB — POCT I-STAT, CHEM 8
BUN: 11 mg/dL (ref 6–23)
Calcium, Ion: 1.22 mmol/L (ref 1.12–1.23)
Chloride: 105 mEq/L (ref 96–112)
Creatinine, Ser: 1 mg/dL (ref 0.50–1.35)
Glucose, Bld: 127 mg/dL — ABNORMAL HIGH (ref 70–99)
HCT: 43 % (ref 39.0–52.0)
Hemoglobin: 14.6 g/dL (ref 13.0–17.0)
Potassium: 3.6 mEq/L (ref 3.5–5.1)
Sodium: 143 mEq/L (ref 135–145)
TCO2: 25 mmol/L (ref 0–100)

## 2012-10-04 LAB — LIPASE, BLOOD: Lipase: 27 U/L (ref 11–59)

## 2012-10-04 MED ORDER — HYDROMORPHONE HCL PF 2 MG/ML IJ SOLN
2.0000 mg | Freq: Once | INTRAMUSCULAR | Status: AC
  Administered 2012-10-04: 2 mg via INTRAVENOUS
  Filled 2012-10-04: qty 1

## 2012-10-04 MED ORDER — HYDROMORPHONE HCL PF 1 MG/ML IJ SOLN
1.0000 mg | Freq: Once | INTRAMUSCULAR | Status: AC
  Administered 2012-10-04: 1 mg via INTRAVENOUS
  Filled 2012-10-04: qty 1

## 2012-10-04 MED ORDER — ONDANSETRON HCL 4 MG/2ML IJ SOLN
4.0000 mg | Freq: Once | INTRAMUSCULAR | Status: AC
  Administered 2012-10-04: 4 mg via INTRAVENOUS
  Filled 2012-10-04: qty 2

## 2012-10-04 MED ORDER — PROMETHAZINE HCL 25 MG PO TABS: 25.0000 mg | ORAL_TABLET | Freq: Four times a day (QID) | ORAL | Status: DC | PRN

## 2012-10-04 MED ORDER — FENTANYL CITRATE 0.05 MG/ML IJ SOLN
50.0000 ug | Freq: Once | INTRAMUSCULAR | Status: AC
  Administered 2012-10-04: 50 ug via INTRAVENOUS

## 2012-10-04 MED ORDER — FENTANYL CITRATE 0.05 MG/ML IJ SOLN
INTRAMUSCULAR | Status: AC
  Filled 2012-10-04: qty 2

## 2012-10-04 MED ORDER — OXYCODONE-ACETAMINOPHEN 5-325 MG PO TABS: 1.0000 | ORAL_TABLET | Freq: Four times a day (QID) | ORAL | Status: DC | PRN

## 2012-10-04 NOTE — ED Notes (Signed)
Per EMS:  Pt complains of diffuse abdominal pain, vomiting, and dairrhea (not with EMS).  Last BM was 5-10 min before being picked up by EMS.
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
## 2012-10-04 NOTE — ED Notes (Signed)
Pt transported to CT ?
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
## 2012-10-05 NOTE — ED Provider Notes (Signed)
Medical screening examination/treatment/procedure(s) were conducted as a shared visit with non-physician practitioner(s) and myself.  I personally evaluated the patient during the encounter  Doug Sou, MD 10/05/12 1607
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
## 2012-10-21 ENCOUNTER — Emergency Department (HOSPITAL_COMMUNITY): Payer: Self-pay

## 2012-10-21 ENCOUNTER — Encounter (HOSPITAL_COMMUNITY): Payer: Self-pay | Admitting: *Deleted

## 2012-10-21 ENCOUNTER — Emergency Department (HOSPITAL_COMMUNITY)
Admission: EM | Admit: 2012-10-21 | Discharge: 2012-10-21 | Disposition: A | Discharge status: Home / Self Care | Payer: Self-pay | Attending: Emergency Medicine | Admitting: Emergency Medicine

## 2012-10-21 DIAGNOSIS — R319 Hematuria, unspecified: Secondary | ICD-10-CM | POA: Insufficient documentation

## 2012-10-21 DIAGNOSIS — Z87828 Personal history of other (healed) physical injury and trauma: Secondary | ICD-10-CM | POA: Insufficient documentation

## 2012-10-21 DIAGNOSIS — R112 Nausea with vomiting, unspecified: Secondary | ICD-10-CM | POA: Insufficient documentation

## 2012-10-21 DIAGNOSIS — R109 Unspecified abdominal pain: Secondary | ICD-10-CM | POA: Insufficient documentation

## 2012-10-21 DIAGNOSIS — Z8719 Personal history of other diseases of the digestive system: Secondary | ICD-10-CM | POA: Insufficient documentation

## 2012-10-21 LAB — CBC WITH DIFFERENTIAL/PLATELET
Basophils Absolute: 0 10*3/uL (ref 0.0–0.1)
Basophils Relative: 1 % (ref 0–1)
Eosinophils Absolute: 0 10*3/uL (ref 0.0–0.7)
Eosinophils Relative: 1 % (ref 0–5)
HCT: 40.8 % (ref 39.0–52.0)
Hemoglobin: 14.1 g/dL (ref 13.0–17.0)
Lymphocytes Relative: 36 % (ref 12–46)
Lymphs Abs: 3.1 10*3/uL (ref 0.7–4.0)
MCH: 31 pg (ref 26.0–34.0)
MCHC: 34.6 g/dL (ref 30.0–36.0)
MCV: 89.7 fL (ref 78.0–100.0)
Monocytes Absolute: 0.7 10*3/uL (ref 0.1–1.0)
Monocytes Relative: 8 % (ref 3–12)
Neutro Abs: 4.8 10*3/uL (ref 1.7–7.7)
Neutrophils Relative %: 55 % (ref 43–77)
Platelets: 199 10*3/uL (ref 150–400)
RBC: 4.55 MIL/uL (ref 4.22–5.81)
RDW: 13.6 % (ref 11.5–15.5)
WBC: 8.7 10*3/uL (ref 4.0–10.5)

## 2012-10-21 LAB — URINALYSIS, ROUTINE W REFLEX MICROSCOPIC
Glucose, UA: NEGATIVE mg/dL
Ketones, ur: 15 mg/dL — AB
Leukocytes, UA: NEGATIVE
Nitrite: NEGATIVE
Protein, ur: NEGATIVE mg/dL
Specific Gravity, Urine: 1.038 — ABNORMAL HIGH (ref 1.005–1.030)
Urobilinogen, UA: 0.2 mg/dL (ref 0.0–1.0)
pH: 5.5 (ref 5.0–8.0)

## 2012-10-21 LAB — BASIC METABOLIC PANEL
BUN: 8 mg/dL (ref 6–23)
CO2: 24 mEq/L (ref 19–32)
Calcium: 9.6 mg/dL (ref 8.4–10.5)
Chloride: 100 mEq/L (ref 96–112)
Creatinine, Ser: 0.94 mg/dL (ref 0.50–1.35)
GFR calc Af Amer: >90 mL/min (ref >90)
GFR calc non Af Amer: >90 mL/min (ref >90)
Glucose, Bld: 97 mg/dL (ref 70–99)
Potassium: 4.3 mEq/L (ref 3.5–5.1)
Sodium: 135 mEq/L (ref 135–145)

## 2012-10-21 LAB — URINE MICROSCOPIC-ADD ON

## 2012-10-21 LAB — LIPASE, BLOOD: Lipase: 30 U/L (ref 11–59)

## 2012-10-21 MED ORDER — HYDROMORPHONE HCL PF 1 MG/ML IJ SOLN
1.0000 mg | Freq: Once | INTRAMUSCULAR | Status: AC
  Administered 2012-10-21: 1 mg via INTRAVENOUS
  Filled 2012-10-21: qty 1

## 2012-10-21 MED ORDER — HYDROMORPHONE HCL PF 1 MG/ML IJ SOLN
1.0000 mg | Freq: Once | INTRAMUSCULAR | Status: AC
  Administered 2012-10-21: 1 mg via INTRAMUSCULAR
  Filled 2012-10-21: qty 1

## 2012-10-21 MED ORDER — DIPHENHYDRAMINE HCL 50 MG/ML IJ SOLN
25.0000 mg | Freq: Once | INTRAMUSCULAR | Status: AC
  Administered 2012-10-21: 50 mg via INTRAVENOUS
  Filled 2012-10-21: qty 1

## 2012-10-21 MED ORDER — ONDANSETRON 4 MG PO TBDP: 4.0000 mg | ORAL_TABLET | Freq: Three times a day (TID) | ORAL | Status: DC | PRN

## 2012-10-21 MED ORDER — ONDANSETRON HCL 4 MG/2ML IJ SOLN
4.0000 mg | Freq: Once | INTRAMUSCULAR | Status: AC
  Administered 2012-10-21: 4 mg via INTRAVENOUS
  Filled 2012-10-21: qty 2

## 2012-10-21 MED ORDER — HYDROCODONE-ACETAMINOPHEN 5-325 MG PO TABS: 1.0000 | ORAL_TABLET | ORAL | Status: DC | PRN

## 2012-10-21 NOTE — ED Notes (Signed)
Patient transported to CT 
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
## 2012-10-21 NOTE — ED Notes (Signed)
Pt was here recently with blood in urine with same right flank pain.  He started having this pain again about one week ago and saw blood in his urine today

## 2012-10-22 NOTE — ED Provider Notes (Signed)
Medical screening examination/treatment/procedure(s) were performed by non-physician practitioner and as supervising physician I was immediately available for consultation/collaboration.   Pecola Haxton, MD 10/22/12 1805 
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
## 2012-10-28 ENCOUNTER — Encounter (HOSPITAL_COMMUNITY): Payer: Self-pay | Admitting: Emergency Medicine

## 2012-10-28 ENCOUNTER — Emergency Department (HOSPITAL_COMMUNITY)
Admission: EM | Admit: 2012-10-28 | Discharge: 2012-10-28 | Disposition: A | Discharge status: Home / Self Care | Payer: Self-pay | Attending: Emergency Medicine | Admitting: Emergency Medicine

## 2012-10-28 DIAGNOSIS — R109 Unspecified abdominal pain: Secondary | ICD-10-CM | POA: Insufficient documentation

## 2012-10-28 DIAGNOSIS — Z872 Personal history of diseases of the skin and subcutaneous tissue: Secondary | ICD-10-CM | POA: Insufficient documentation

## 2012-10-28 DIAGNOSIS — G8929 Other chronic pain: Secondary | ICD-10-CM | POA: Insufficient documentation

## 2012-10-28 DIAGNOSIS — Z8719 Personal history of other diseases of the digestive system: Secondary | ICD-10-CM | POA: Insufficient documentation

## 2012-10-28 DIAGNOSIS — F17200 Nicotine dependence, unspecified, uncomplicated: Secondary | ICD-10-CM | POA: Insufficient documentation

## 2012-10-28 DIAGNOSIS — Z87828 Personal history of other (healed) physical injury and trauma: Secondary | ICD-10-CM | POA: Insufficient documentation

## 2012-10-28 DIAGNOSIS — R509 Fever, unspecified: Secondary | ICD-10-CM | POA: Insufficient documentation

## 2012-10-28 LAB — CBC WITH DIFFERENTIAL/PLATELET
Basophils Absolute: 0 10*3/uL (ref 0.0–0.1)
Basophils Relative: 1 % (ref 0–1)
Eosinophils Absolute: 0.1 10*3/uL (ref 0.0–0.7)
Eosinophils Relative: 1 % (ref 0–5)
HCT: 39.2 % (ref 39.0–52.0)
Hemoglobin: 13.9 g/dL (ref 13.0–17.0)
Lymphocytes Relative: 43 % (ref 12–46)
Lymphs Abs: 3.2 10*3/uL (ref 0.7–4.0)
MCH: 31.5 pg (ref 26.0–34.0)
MCHC: 35.5 g/dL (ref 30.0–36.0)
MCV: 88.9 fL (ref 78.0–100.0)
Monocytes Absolute: 0.7 10*3/uL (ref 0.1–1.0)
Monocytes Relative: 9 % (ref 3–12)
Neutro Abs: 3.5 10*3/uL (ref 1.7–7.7)
Neutrophils Relative %: 47 % (ref 43–77)
Platelets: 218 10*3/uL (ref 150–400)
RBC: 4.41 MIL/uL (ref 4.22–5.81)
RDW: 13.6 % (ref 11.5–15.5)
WBC: 7.6 10*3/uL (ref 4.0–10.5)

## 2012-10-28 LAB — URINALYSIS, MICROSCOPIC ONLY
Glucose, UA: NEGATIVE mg/dL
Hgb urine dipstick: NEGATIVE
Nitrite: NEGATIVE
Protein, ur: NEGATIVE mg/dL
Specific Gravity, Urine: 1.044 — ABNORMAL HIGH (ref 1.005–1.030)
Urobilinogen, UA: 0.2 mg/dL (ref 0.0–1.0)
pH: 5.5 (ref 5.0–8.0)

## 2012-10-28 LAB — COMPREHENSIVE METABOLIC PANEL
ALT: 32 U/L (ref 0–53)
AST: 25 U/L (ref 0–37)
Albumin: 4.2 g/dL (ref 3.5–5.2)
Alkaline Phosphatase: 62 U/L (ref 39–117)
BUN: 10 mg/dL (ref 6–23)
CO2: 27 mEq/L (ref 19–32)
Calcium: 9.7 mg/dL (ref 8.4–10.5)
Chloride: 101 mEq/L (ref 96–112)
Creatinine, Ser: 0.98 mg/dL (ref 0.50–1.35)
GFR calc Af Amer: >90 mL/min (ref >90)
GFR calc non Af Amer: >90 mL/min (ref >90)
Glucose, Bld: 95 mg/dL (ref 70–99)
Potassium: 4 mEq/L (ref 3.5–5.1)
Sodium: 137 mEq/L (ref 135–145)
Total Bilirubin: 0.5 mg/dL (ref 0.3–1.2)
Total Protein: 7.4 g/dL (ref 6.0–8.3)

## 2012-10-28 LAB — LIPASE, BLOOD: Lipase: 28 U/L (ref 11–59)

## 2012-10-28 MED ORDER — KETOROLAC TROMETHAMINE 30 MG/ML IJ SOLN
30.0000 mg | Freq: Once | INTRAMUSCULAR | Status: AC
  Administered 2012-10-28: 30 mg via INTRAVENOUS
  Filled 2012-10-28: qty 1

## 2012-10-28 MED ORDER — HYDROMORPHONE HCL PF 1 MG/ML IJ SOLN
1.0000 mg | Freq: Once | INTRAMUSCULAR | Status: AC
  Administered 2012-10-28: 1 mg via INTRAVENOUS
  Filled 2012-10-28: qty 1

## 2012-10-28 MED ORDER — LORAZEPAM 2 MG/ML IJ SOLN
1.0000 mg | Freq: Once | INTRAMUSCULAR | Status: AC
  Administered 2012-10-28: 1 mg via INTRAVENOUS
  Filled 2012-10-28: qty 1

## 2012-10-28 NOTE — ED Notes (Signed)
Bed:WA12<BR> Expected date:<BR> Expected time:<BR> Means of arrival:<BR> Comments:<BR> abd pain

## 2012-10-28 NOTE — ED Notes (Signed)
Pt arrives via EMS with complaints of abdominal pain x 2 days. Pt complains of pain to right lower quadrant at this time.

## 2012-10-28 NOTE — ED Notes (Signed)
Pt loud, obnoxious, rude and cursing. Pt states he needs help and we are not helping him because the pain medication isn't working. Pt is being very disrespectful. Pt requesting head doctor and charge nurse to bedside. Charge rn notified and aware. Md Freida Busman at bedside to talk with pt. Md Freida Busman very respectful to patient and explaining information nicely. Pt verbalized understanding and need of plan of care.
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
## 2012-10-29 ENCOUNTER — Emergency Department (HOSPITAL_COMMUNITY)
Admission: EM | Admit: 2012-10-29 | Discharge: 2012-10-29 | Disposition: A | Discharge status: Home / Self Care | Payer: Self-pay | Attending: Emergency Medicine | Admitting: Emergency Medicine

## 2012-10-29 ENCOUNTER — Encounter (HOSPITAL_COMMUNITY): Payer: Self-pay | Admitting: *Deleted

## 2012-10-29 DIAGNOSIS — Z8719 Personal history of other diseases of the digestive system: Secondary | ICD-10-CM | POA: Insufficient documentation

## 2012-10-29 DIAGNOSIS — Z8781 Personal history of (healed) traumatic fracture: Secondary | ICD-10-CM | POA: Insufficient documentation

## 2012-10-29 DIAGNOSIS — F17200 Nicotine dependence, unspecified, uncomplicated: Secondary | ICD-10-CM | POA: Insufficient documentation

## 2012-10-29 DIAGNOSIS — Z79899 Other long term (current) drug therapy: Secondary | ICD-10-CM | POA: Insufficient documentation

## 2012-10-29 DIAGNOSIS — R141 Gas pain: Secondary | ICD-10-CM | POA: Insufficient documentation

## 2012-10-29 DIAGNOSIS — R143 Flatulence: Secondary | ICD-10-CM | POA: Insufficient documentation

## 2012-10-29 DIAGNOSIS — R110 Nausea: Secondary | ICD-10-CM | POA: Insufficient documentation

## 2012-10-29 DIAGNOSIS — R142 Eructation: Secondary | ICD-10-CM | POA: Insufficient documentation

## 2012-10-29 DIAGNOSIS — R109 Unspecified abdominal pain: Secondary | ICD-10-CM | POA: Insufficient documentation

## 2012-10-29 MED ORDER — HYDROMORPHONE HCL PF 2 MG/ML IJ SOLN
2.0000 mg | Freq: Once | INTRAMUSCULAR | Status: DC
  Filled 2012-10-29: qty 1

## 2012-10-29 MED ORDER — HYDROMORPHONE HCL PF 2 MG/ML IJ SOLN
2.0000 mg | Freq: Once | INTRAMUSCULAR | Status: AC
  Administered 2012-10-29: 2 mg via INTRAVENOUS

## 2012-10-29 MED ORDER — ONDANSETRON HCL 4 MG/2ML IJ SOLN
4.0000 mg | Freq: Once | INTRAMUSCULAR | Status: AC
  Administered 2012-10-29: 4 mg via INTRAVENOUS
  Filled 2012-10-29: qty 2

## 2012-10-29 NOTE — ED Notes (Signed)
The pt is c/o abd pain since yesterday.  He was seen at [facility] long then.  His pain continues nv no diarrhea

## 2012-10-29 NOTE — ED Notes (Signed)
Pt yelling & flailing his body in the bed, pt asking for pain medication, Delo, MD informed, pt informed of fall risk precautions & provided the call bell for assistance out of bed
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
## 2012-11-01 ENCOUNTER — Encounter (HOSPITAL_COMMUNITY): Payer: Self-pay

## 2012-11-01 ENCOUNTER — Emergency Department (HOSPITAL_COMMUNITY)
Admission: EM | Admit: 2012-11-01 | Discharge: 2012-11-01 | Disposition: A | Discharge status: Home / Self Care | Payer: Self-pay | Attending: Emergency Medicine | Admitting: Emergency Medicine

## 2012-11-01 DIAGNOSIS — Z872 Personal history of diseases of the skin and subcutaneous tissue: Secondary | ICD-10-CM | POA: Insufficient documentation

## 2012-11-01 DIAGNOSIS — R112 Nausea with vomiting, unspecified: Secondary | ICD-10-CM | POA: Insufficient documentation

## 2012-11-01 DIAGNOSIS — Z8719 Personal history of other diseases of the digestive system: Secondary | ICD-10-CM | POA: Insufficient documentation

## 2012-11-01 DIAGNOSIS — R109 Unspecified abdominal pain: Secondary | ICD-10-CM | POA: Insufficient documentation

## 2012-11-01 DIAGNOSIS — IMO0002 Reserved for concepts with insufficient information to code with codable children: Secondary | ICD-10-CM | POA: Insufficient documentation

## 2012-11-01 DIAGNOSIS — F17200 Nicotine dependence, unspecified, uncomplicated: Secondary | ICD-10-CM | POA: Insufficient documentation

## 2012-11-01 DIAGNOSIS — G8929 Other chronic pain: Secondary | ICD-10-CM | POA: Insufficient documentation

## 2012-11-01 DIAGNOSIS — R52 Pain, unspecified: Secondary | ICD-10-CM | POA: Insufficient documentation

## 2012-11-01 DIAGNOSIS — Z9089 Acquired absence of other organs: Secondary | ICD-10-CM | POA: Insufficient documentation

## 2012-11-01 DIAGNOSIS — Z87828 Personal history of other (healed) physical injury and trauma: Secondary | ICD-10-CM | POA: Insufficient documentation

## 2012-11-01 LAB — COMPREHENSIVE METABOLIC PANEL
ALT: 21 U/L (ref 0–53)
AST: 22 U/L (ref 0–37)
Albumin: 4.4 g/dL (ref 3.5–5.2)
Alkaline Phosphatase: 62 U/L (ref 39–117)
BUN: 9 mg/dL (ref 6–23)
CO2: 28 mEq/L (ref 19–32)
Calcium: 10.1 mg/dL (ref 8.4–10.5)
Chloride: 101 mEq/L (ref 96–112)
Creatinine, Ser: 1.05 mg/dL (ref 0.50–1.35)
GFR calc Af Amer: >90 mL/min (ref >90)
GFR calc non Af Amer: 82 mL/min — ABNORMAL LOW (ref >90)
Glucose, Bld: 131 mg/dL — ABNORMAL HIGH (ref 70–99)
Potassium: 3.7 mEq/L (ref 3.5–5.1)
Sodium: 140 mEq/L (ref 135–145)
Total Bilirubin: 0.5 mg/dL (ref 0.3–1.2)
Total Protein: 7.3 g/dL (ref 6.0–8.3)

## 2012-11-01 LAB — CBC WITH DIFFERENTIAL/PLATELET
Basophils Absolute: 0 10*3/uL (ref 0.0–0.1)
Basophils Relative: 0 % (ref 0–1)
Eosinophils Absolute: 0 10*3/uL (ref 0.0–0.7)
Eosinophils Relative: 0 % (ref 0–5)
HCT: 37.9 % — ABNORMAL LOW (ref 39.0–52.0)
Hemoglobin: 13.1 g/dL (ref 13.0–17.0)
Lymphocytes Relative: 23 % (ref 12–46)
Lymphs Abs: 1.6 10*3/uL (ref 0.7–4.0)
MCH: 31 pg (ref 26.0–34.0)
MCHC: 34.6 g/dL (ref 30.0–36.0)
MCV: 89.8 fL (ref 78.0–100.0)
Monocytes Absolute: 0.8 10*3/uL (ref 0.1–1.0)
Monocytes Relative: 11 % (ref 3–12)
Neutro Abs: 4.7 10*3/uL (ref 1.7–7.7)
Neutrophils Relative %: 66 % (ref 43–77)
Platelets: 187 10*3/uL (ref 150–400)
RBC: 4.22 MIL/uL (ref 4.22–5.81)
RDW: 13.4 % (ref 11.5–15.5)
WBC: 7.1 10*3/uL (ref 4.0–10.5)

## 2012-11-01 LAB — LACTIC ACID, PLASMA: Lactic Acid, Venous: 1.7 mmol/L (ref 0.5–2.2)

## 2012-11-01 LAB — LIPASE, BLOOD: Lipase: 82 U/L — ABNORMAL HIGH (ref 11–59)

## 2012-11-01 MED ORDER — DROPERIDOL 2.5 MG/ML IJ SOLN
1.2500 mg | Freq: Once | INTRAMUSCULAR | Status: AC
  Administered 2012-11-01: 1.25 mg via INTRAVENOUS
  Filled 2012-11-01: qty 0.5

## 2012-11-01 MED ORDER — ONDANSETRON HCL 4 MG PO TABS: 4.0000 mg | ORAL_TABLET | Freq: Four times a day (QID) | ORAL | Status: DC

## 2012-11-01 MED ORDER — DIPHENHYDRAMINE HCL 50 MG/ML IJ SOLN
25.0000 mg | Freq: Once | INTRAMUSCULAR | Status: AC
  Administered 2012-11-01: 25 mg via INTRAVENOUS
  Filled 2012-11-01: qty 1

## 2012-11-01 MED ORDER — LORAZEPAM 2 MG/ML IJ SOLN
1.0000 mg | Freq: Once | INTRAMUSCULAR | Status: AC
  Administered 2012-11-01: 1 mg via INTRAVENOUS
  Filled 2012-11-01: qty 1

## 2012-11-01 MED ORDER — ONDANSETRON HCL 4 MG/2ML IJ SOLN
INTRAMUSCULAR | Status: AC
  Filled 2012-11-01: qty 2

## 2012-11-01 MED ORDER — DICYCLOMINE HCL 20 MG PO TABS: 20.0000 mg | ORAL_TABLET | Freq: Four times a day (QID) | ORAL | Status: DC | PRN
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
## 2012-11-01 NOTE — ED Notes (Signed)
Pt had sudden onset of abdominal pain that started around 8 o'clock today; pt has a hx of pancreatitis; pt's pain RLQ 10/10 "Sharp, stabbing, cramping pain";

## 2012-11-02 ENCOUNTER — Encounter (HOSPITAL_COMMUNITY): Payer: Self-pay | Admitting: Emergency Medicine

## 2012-11-02 DIAGNOSIS — Z79899 Other long term (current) drug therapy: Secondary | ICD-10-CM | POA: Insufficient documentation

## 2012-11-02 DIAGNOSIS — R112 Nausea with vomiting, unspecified: Secondary | ICD-10-CM | POA: Insufficient documentation

## 2012-11-02 DIAGNOSIS — Z8719 Personal history of other diseases of the digestive system: Secondary | ICD-10-CM | POA: Insufficient documentation

## 2012-11-02 DIAGNOSIS — G8929 Other chronic pain: Secondary | ICD-10-CM | POA: Insufficient documentation

## 2012-11-02 DIAGNOSIS — Z872 Personal history of diseases of the skin and subcutaneous tissue: Secondary | ICD-10-CM | POA: Insufficient documentation

## 2012-11-02 DIAGNOSIS — Z87828 Personal history of other (healed) physical injury and trauma: Secondary | ICD-10-CM | POA: Insufficient documentation

## 2012-11-02 DIAGNOSIS — F17200 Nicotine dependence, unspecified, uncomplicated: Secondary | ICD-10-CM | POA: Insufficient documentation

## 2012-11-02 DIAGNOSIS — R109 Unspecified abdominal pain: Secondary | ICD-10-CM | POA: Insufficient documentation

## 2012-11-02 DIAGNOSIS — Z8739 Personal history of other diseases of the musculoskeletal system and connective tissue: Secondary | ICD-10-CM | POA: Insufficient documentation

## 2012-11-02 NOTE — ED Notes (Addendum)
Pt. States he had sudden onset of severe abdominal pain. States he has not had a BM in 3 days.  States he vomited 5 times. Nauseated now. States fever and chills. Recent blood work reviewed.

## 2012-11-03 ENCOUNTER — Emergency Department (HOSPITAL_COMMUNITY)
Admission: EM | Admit: 2012-11-03 | Discharge: 2012-11-03 | Disposition: A | Discharge status: Home / Self Care | Payer: Self-pay | Attending: Emergency Medicine | Admitting: Emergency Medicine

## 2012-11-03 ENCOUNTER — Emergency Department (HOSPITAL_COMMUNITY): Payer: Self-pay

## 2012-11-03 DIAGNOSIS — R109 Unspecified abdominal pain: Secondary | ICD-10-CM

## 2012-11-03 DIAGNOSIS — G894 Chronic pain syndrome: Secondary | ICD-10-CM

## 2012-11-03 LAB — COMPREHENSIVE METABOLIC PANEL
ALT: 19 U/L (ref 0–53)
AST: 22 U/L (ref 0–37)
Albumin: 4 g/dL (ref 3.5–5.2)
Alkaline Phosphatase: 59 U/L (ref 39–117)
BUN: 9 mg/dL (ref 6–23)
CO2: 26 mEq/L (ref 19–32)
Calcium: 9.7 mg/dL (ref 8.4–10.5)
Chloride: 99 mEq/L (ref 96–112)
Creatinine, Ser: 0.98 mg/dL (ref 0.50–1.35)
GFR calc Af Amer: >90 mL/min (ref >90)
GFR calc non Af Amer: >90 mL/min (ref >90)
Glucose, Bld: 110 mg/dL — ABNORMAL HIGH (ref 70–99)
Potassium: 4.1 mEq/L (ref 3.5–5.1)
Sodium: 137 mEq/L (ref 135–145)
Total Bilirubin: 0.4 mg/dL (ref 0.3–1.2)
Total Protein: 7.1 g/dL (ref 6.0–8.3)

## 2012-11-03 LAB — LIPASE, BLOOD: Lipase: 23 U/L (ref 11–59)

## 2012-11-03 LAB — CBC
HCT: 40.4 % (ref 39.0–52.0)
Hemoglobin: 14.3 g/dL (ref 13.0–17.0)
MCH: 31.6 pg (ref 26.0–34.0)
MCHC: 35.4 g/dL (ref 30.0–36.0)
MCV: 89.2 fL (ref 78.0–100.0)
Platelets: 170 10*3/uL (ref 150–400)
RBC: 4.53 MIL/uL (ref 4.22–5.81)
RDW: 13.2 % (ref 11.5–15.5)
WBC: 9.6 10*3/uL (ref 4.0–10.5)

## 2012-11-03 LAB — RAPID URINE DRUG SCREEN, HOSP PERFORMED
Amphetamines: NOT DETECTED
Barbiturates: NOT DETECTED
Benzodiazepines: NOT DETECTED
Cocaine: NOT DETECTED
Opiates: NOT DETECTED
Tetrahydrocannabinol: POSITIVE — AB

## 2012-11-03 LAB — ETHANOL: Alcohol, Ethyl (B): <11 mg/dL (ref 0–11)

## 2012-11-03 MED ORDER — LORAZEPAM 2 MG/ML IJ SOLN
2.0000 mg | Freq: Once | INTRAMUSCULAR | Status: AC
  Administered 2012-11-03: 2 mg via INTRAVENOUS
  Filled 2012-11-03: qty 1

## 2012-11-03 MED ORDER — METOCLOPRAMIDE HCL 5 MG/ML IJ SOLN
10.0000 mg | Freq: Once | INTRAMUSCULAR | Status: AC
  Administered 2012-11-03: 10 mg via INTRAVENOUS
  Filled 2012-11-03: qty 2

## 2012-11-03 MED ORDER — OXYCODONE-ACETAMINOPHEN 5-325 MG PO TABS
1.0000 | ORAL_TABLET | Freq: Once | ORAL | Status: AC
  Administered 2012-11-03: 1 via ORAL
  Filled 2012-11-03: qty 1

## 2012-11-03 MED ORDER — METOCLOPRAMIDE HCL 10 MG PO TABS: 10.0000 mg | ORAL_TABLET | Freq: Four times a day (QID) | ORAL | Status: DC

## 2012-11-03 MED ORDER — KETOROLAC TROMETHAMINE 30 MG/ML IJ SOLN
30.0000 mg | Freq: Once | INTRAMUSCULAR | Status: AC
  Administered 2012-11-03: 30 mg via INTRAVENOUS
  Filled 2012-11-03: qty 1
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
## 2012-11-03 NOTE — ED Notes (Signed)
Once again instructed pt we need urin sample, handed pt urinal and told him I'd check back.
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
## 2013-01-25 IMAGING — CR DG ABDOMEN ACUTE W/ 1V CHEST
4 series · 4 of 4 positions shown · non-contrast
Comparison: 01/20/2012

CLINICAL DATA: Right lower abdominal pain and diarrhea

ACUTE ABDOMEN SERIES (ABDOMEN 2 VIEW & CHEST 1 VIEW)

[w chest pa]
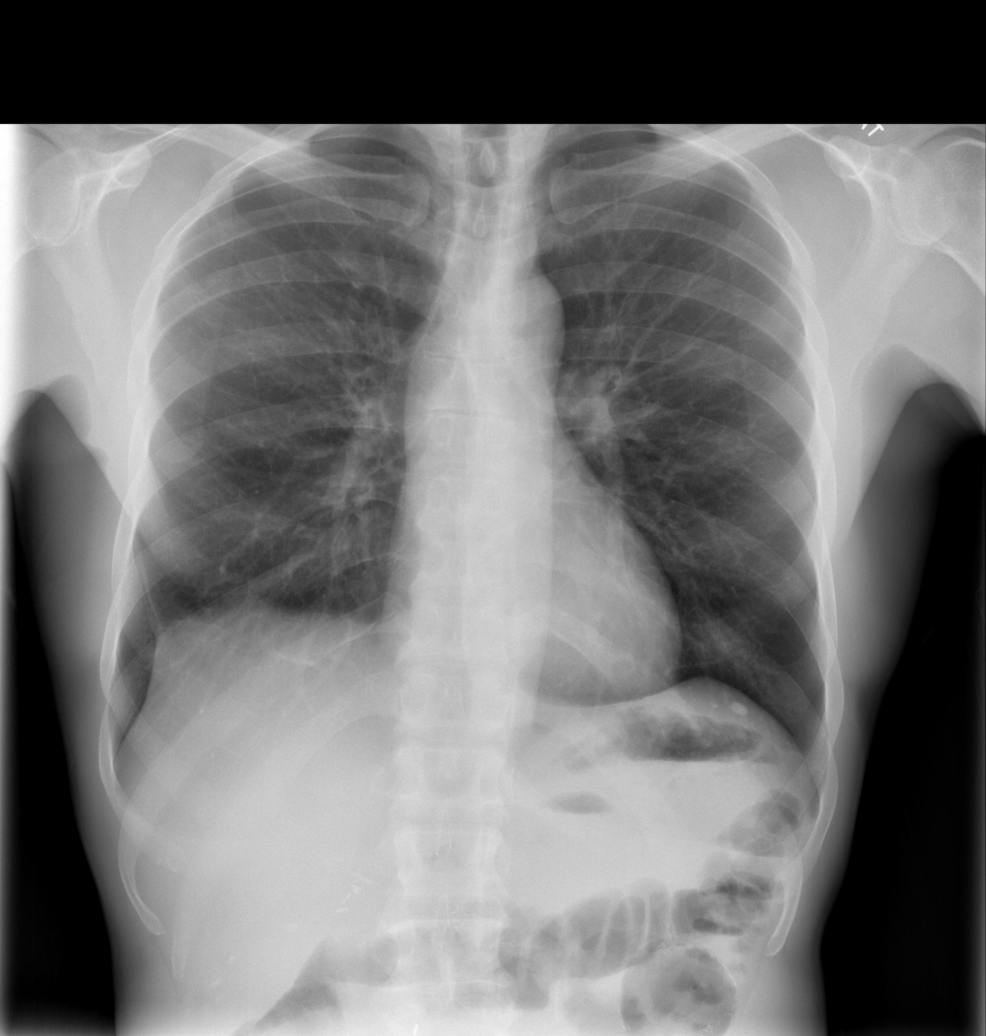

[w abdomen upright]
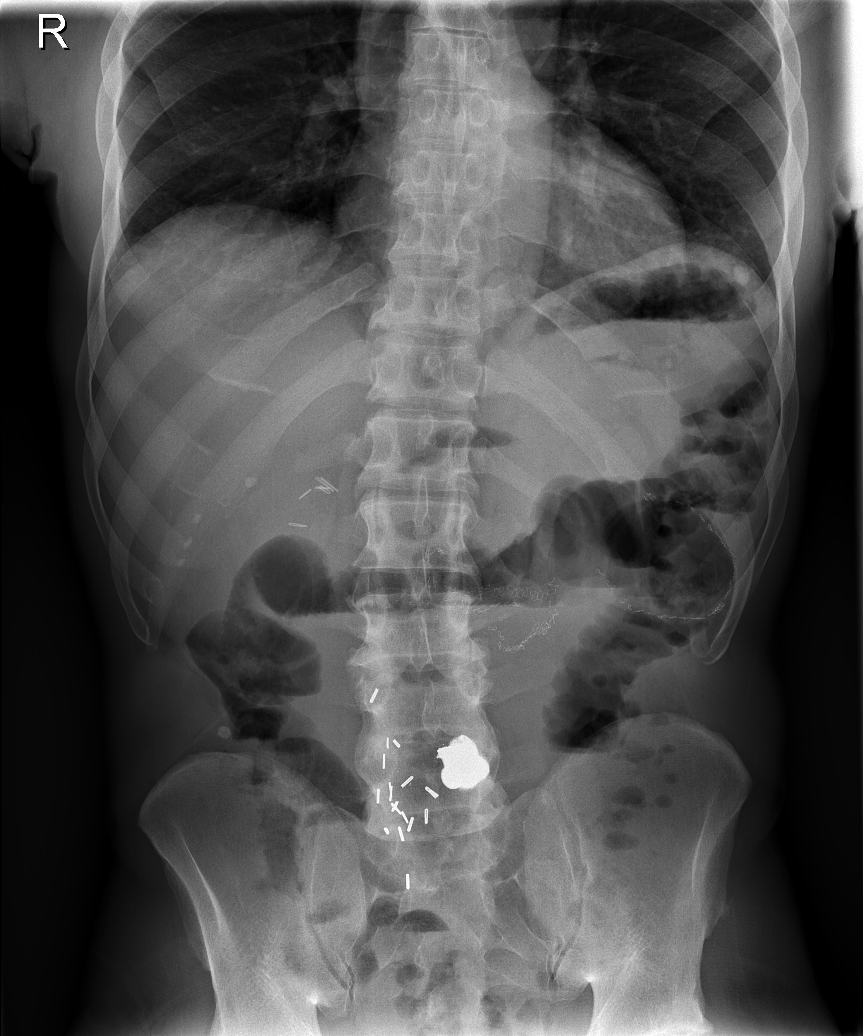

[t abdomen supine (1 of 2)]
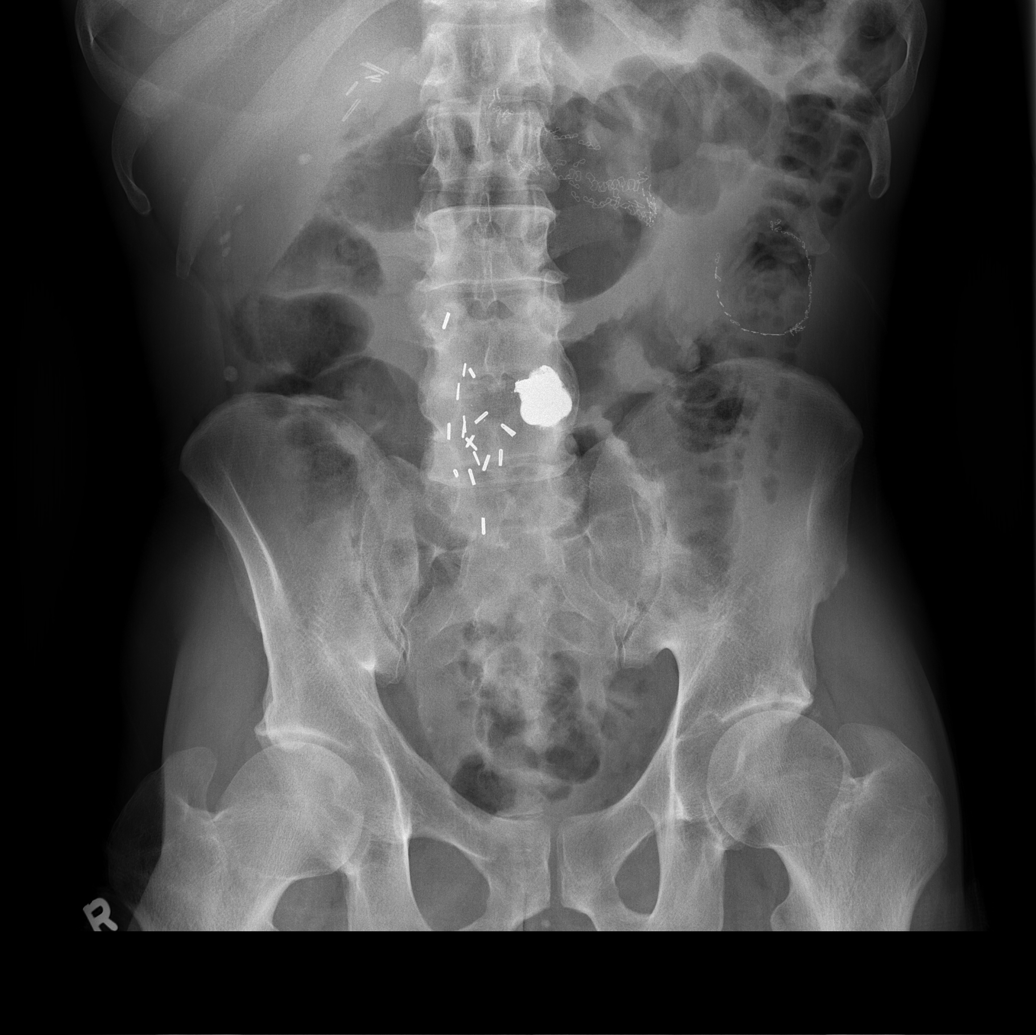

[t abdomen supine (2 of 2)]
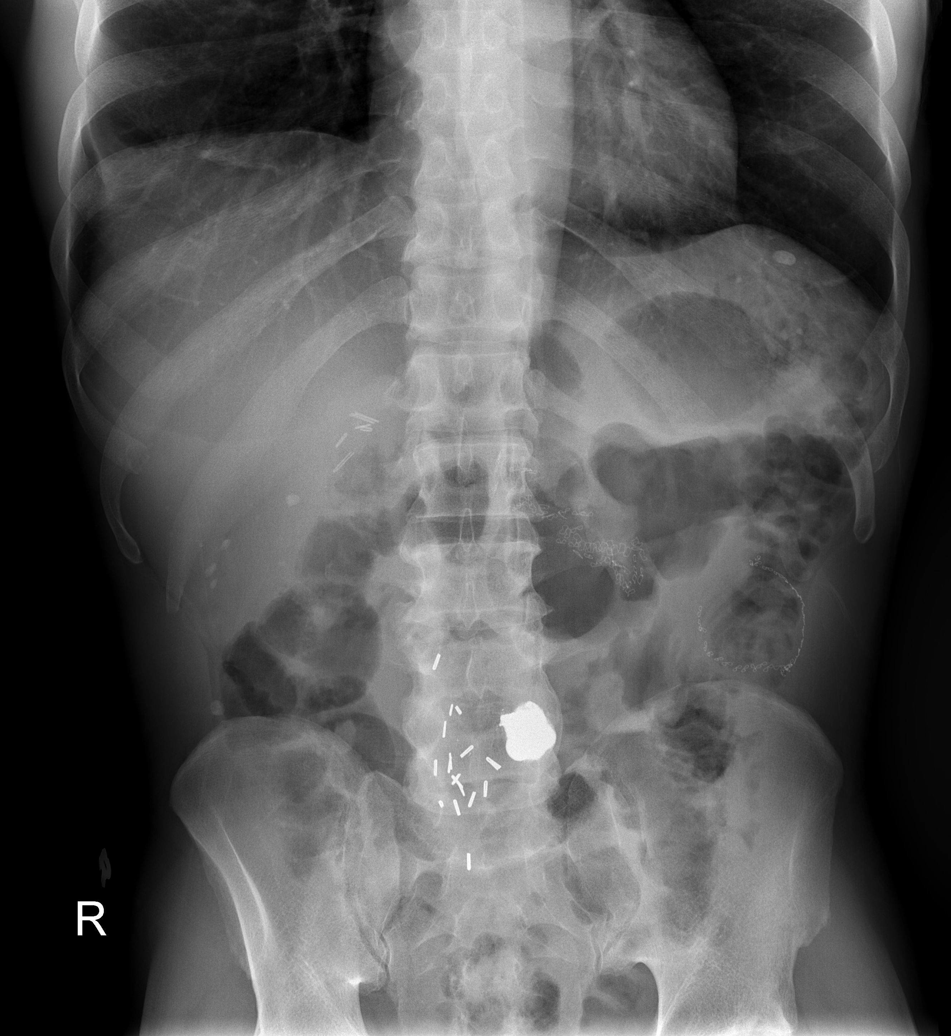

[4 of 4 positions shown; findings below may reference images not displayed]

FINDINGS: The heart size appears normal.

No pleural effusion or pulmonary edema.

No airspace consolidation noted.

Extensive postoperative changes are identified within the bowel.

The bowel gas pattern appears nonspecific.

 There is gas and stool noted throughout the colon.  However, there
are multiple dilated loops of small bowel which measure up to
cm.  Air-fluid levels noted on the upright images.
IMPRESSION: 1.  Small bowel dilatation and air-fluid levels.  Cannot rule out
partial small bowel obstruction.
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
## 2013-02-06 ENCOUNTER — Emergency Department (HOSPITAL_COMMUNITY)
Admission: EM | Admit: 2013-02-06 | Discharge: 2013-02-06 | Disposition: A | Discharge status: Home / Self Care | Payer: Self-pay | Attending: Emergency Medicine | Admitting: Emergency Medicine

## 2013-02-06 ENCOUNTER — Encounter (HOSPITAL_COMMUNITY): Payer: Self-pay | Admitting: Unknown Physician Specialty

## 2013-02-06 ENCOUNTER — Emergency Department (HOSPITAL_COMMUNITY): Payer: Self-pay

## 2013-02-06 DIAGNOSIS — Z872 Personal history of diseases of the skin and subcutaneous tissue: Secondary | ICD-10-CM | POA: Insufficient documentation

## 2013-02-06 DIAGNOSIS — R1084 Generalized abdominal pain: Secondary | ICD-10-CM | POA: Insufficient documentation

## 2013-02-06 DIAGNOSIS — Z87828 Personal history of other (healed) physical injury and trauma: Secondary | ICD-10-CM | POA: Insufficient documentation

## 2013-02-06 DIAGNOSIS — Z79899 Other long term (current) drug therapy: Secondary | ICD-10-CM | POA: Insufficient documentation

## 2013-02-06 DIAGNOSIS — F17200 Nicotine dependence, unspecified, uncomplicated: Secondary | ICD-10-CM | POA: Insufficient documentation

## 2013-02-06 DIAGNOSIS — Z8719 Personal history of other diseases of the digestive system: Secondary | ICD-10-CM | POA: Insufficient documentation

## 2013-02-06 DIAGNOSIS — G8929 Other chronic pain: Secondary | ICD-10-CM | POA: Insufficient documentation

## 2013-02-06 LAB — CBC
HCT: 43.2 % (ref 39.0–52.0)
Hemoglobin: 15.3 g/dL (ref 13.0–17.0)
MCH: 32.3 pg (ref 26.0–34.0)
MCHC: 35.4 g/dL (ref 30.0–36.0)
MCV: 91.3 fL (ref 78.0–100.0)
Platelets: 171 10*3/uL (ref 150–400)
RBC: 4.73 MIL/uL (ref 4.22–5.81)
RDW: 13.1 % (ref 11.5–15.5)
WBC: 10 10*3/uL (ref 4.0–10.5)

## 2013-02-06 LAB — COMPREHENSIVE METABOLIC PANEL
ALT: 23 U/L (ref 0–53)
AST: 29 U/L (ref 0–37)
Albumin: 4.3 g/dL (ref 3.5–5.2)
Alkaline Phosphatase: 67 U/L (ref 39–117)
BUN: 14 mg/dL (ref 6–23)
CO2: 21 mEq/L (ref 19–32)
Calcium: 9.5 mg/dL (ref 8.4–10.5)
Chloride: 105 mEq/L (ref 96–112)
Creatinine, Ser: 1.13 mg/dL (ref 0.50–1.35)
GFR calc Af Amer: 87 mL/min — ABNORMAL LOW (ref >90)
GFR calc non Af Amer: 75 mL/min — ABNORMAL LOW (ref >90)
Glucose, Bld: 125 mg/dL — ABNORMAL HIGH (ref 70–99)
Potassium: 4.3 mEq/L (ref 3.5–5.1)
Sodium: 141 mEq/L (ref 135–145)
Total Bilirubin: 0.6 mg/dL (ref 0.3–1.2)
Total Protein: 7.9 g/dL (ref 6.0–8.3)

## 2013-02-06 LAB — LIPASE, BLOOD: Lipase: 34 U/L (ref 11–59)

## 2013-02-06 MED ORDER — DIPHENHYDRAMINE HCL 50 MG/ML IJ SOLN: 25.0000 mg | Freq: Once | INTRAMUSCULAR | Status: DC

## 2013-02-06 MED ORDER — DROPERIDOL 2.5 MG/ML IJ SOLN
2.5000 mg | Freq: Once | INTRAMUSCULAR | Status: DC
  Filled 2013-02-06: qty 1

## 2013-02-06 MED ORDER — ONDANSETRON HCL 4 MG/2ML IJ SOLN
4.0000 mg | Freq: Once | INTRAMUSCULAR | Status: AC
  Administered 2013-02-06: 4 mg via INTRAVENOUS
  Filled 2013-02-06: qty 2

## 2013-02-06 MED ORDER — DROPERIDOL 2.5 MG/ML IJ SOLN: 2.5000 mg | Freq: Once | INTRAMUSCULAR | Status: DC

## 2013-02-06 MED ORDER — HYDROMORPHONE HCL PF 1 MG/ML IJ SOLN
INTRAMUSCULAR | Status: AC
  Filled 2013-02-06: qty 1

## 2013-02-06 MED ORDER — LORAZEPAM 2 MG/ML IJ SOLN
1.0000 mg | Freq: Once | INTRAMUSCULAR | Status: AC
  Administered 2013-02-06: 1 mg via INTRAVENOUS
  Filled 2013-02-06: qty 1

## 2013-02-06 MED ORDER — ONDANSETRON HCL 4 MG PO TABS: 4.0000 mg | ORAL_TABLET | Freq: Four times a day (QID) | ORAL | Status: DC

## 2013-02-06 MED ORDER — HYDROMORPHONE HCL PF 1 MG/ML IJ SOLN
1.0000 mg | Freq: Once | INTRAMUSCULAR | Status: AC
  Administered 2013-02-06: 1 mg via INTRAVENOUS

## 2013-02-06 MED ORDER — HYDROMORPHONE HCL PF 1 MG/ML IJ SOLN
1.0000 mg | Freq: Once | INTRAMUSCULAR | Status: AC
  Administered 2013-02-06: 1 mg via INTRAVENOUS
  Filled 2013-02-06: qty 1

## 2013-02-06 MED ORDER — METOCLOPRAMIDE HCL 5 MG/ML IJ SOLN: 10.0000 mg | Freq: Once | INTRAMUSCULAR | Status: DC

## 2013-02-06 MED ORDER — LORAZEPAM 2 MG/ML IJ SOLN: 1.0000 mg | Freq: Once | INTRAMUSCULAR | Status: DC

## 2013-02-06 MED ORDER — DICYCLOMINE HCL 10 MG/ML IM SOLN
20.0000 mg | Freq: Once | INTRAMUSCULAR | Status: AC
  Administered 2013-02-06: 20 mg via INTRAMUSCULAR
  Filled 2013-02-06: qty 2

## 2013-02-06 MED ORDER — DICYCLOMINE HCL 20 MG PO TABS: 20.0000 mg | ORAL_TABLET | Freq: Two times a day (BID) | ORAL | Status: DC

## 2013-02-06 NOTE — ED Notes (Signed)
Patient received zofran 4mg  iv from EMS.

## 2013-02-08 ENCOUNTER — Encounter (HOSPITAL_COMMUNITY): Payer: Self-pay

## 2013-02-08 ENCOUNTER — Emergency Department (HOSPITAL_COMMUNITY)
Admission: EM | Admit: 2013-02-08 | Discharge: 2013-02-08 | Disposition: A | Discharge status: Home / Self Care | Payer: Self-pay | Attending: Emergency Medicine | Admitting: Emergency Medicine

## 2013-02-08 DIAGNOSIS — Z872 Personal history of diseases of the skin and subcutaneous tissue: Secondary | ICD-10-CM | POA: Insufficient documentation

## 2013-02-08 DIAGNOSIS — F17200 Nicotine dependence, unspecified, uncomplicated: Secondary | ICD-10-CM | POA: Insufficient documentation

## 2013-02-08 DIAGNOSIS — Z79899 Other long term (current) drug therapy: Secondary | ICD-10-CM | POA: Insufficient documentation

## 2013-02-08 DIAGNOSIS — Z87828 Personal history of other (healed) physical injury and trauma: Secondary | ICD-10-CM | POA: Insufficient documentation

## 2013-02-08 DIAGNOSIS — Z8719 Personal history of other diseases of the digestive system: Secondary | ICD-10-CM | POA: Insufficient documentation

## 2013-02-08 DIAGNOSIS — G8929 Other chronic pain: Secondary | ICD-10-CM | POA: Insufficient documentation

## 2013-02-08 DIAGNOSIS — R109 Unspecified abdominal pain: Secondary | ICD-10-CM | POA: Insufficient documentation

## 2013-02-08 LAB — URINALYSIS, ROUTINE W REFLEX MICROSCOPIC
Glucose, UA: NEGATIVE mg/dL
Hgb urine dipstick: NEGATIVE
Ketones, ur: 15 mg/dL — AB
Nitrite: NEGATIVE
Protein, ur: 100 mg/dL — AB
Specific Gravity, Urine: >1.046 — ABNORMAL HIGH (ref 1.005–1.030)
Urobilinogen, UA: 1 mg/dL (ref 0.0–1.0)
pH: 6 (ref 5.0–8.0)

## 2013-02-08 LAB — URINE MICROSCOPIC-ADD ON

## 2013-02-08 MED ORDER — LORAZEPAM 2 MG/ML IJ SOLN
1.0000 mg | Freq: Once | INTRAMUSCULAR | Status: AC
  Administered 2013-02-08: 03:00:00 via INTRAVENOUS
  Filled 2013-02-08: qty 1

## 2013-02-08 MED ORDER — DICYCLOMINE HCL 10 MG/ML IM SOLN
20.0000 mg | Freq: Once | INTRAMUSCULAR | Status: AC
  Administered 2013-02-08: 20 mg via INTRAMUSCULAR
  Filled 2013-02-08: qty 2

## 2013-02-08 MED ORDER — KETAMINE HCL 10 MG/ML IJ SOLN
0.1000 mg/kg | Freq: Once | INTRAMUSCULAR | Status: AC
  Administered 2013-02-08: 03:00:00 via INTRAVENOUS

## 2013-02-08 MED ORDER — KETOROLAC TROMETHAMINE 30 MG/ML IJ SOLN
30.0000 mg | Freq: Once | INTRAMUSCULAR | Status: AC
  Administered 2013-02-08: 30 mg via INTRAVENOUS
  Filled 2013-02-08: qty 1
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
## 2013-02-08 NOTE — ED Notes (Signed)
Bed:WA17<BR> Expected date:<BR> Expected time:<BR> Means of arrival:<BR> Comments:<BR> EMS
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
## 2013-02-08 NOTE — Discharge Instructions (Signed)
Please take the medications prescribed to you on your ED visit earlier this week.  Follow up with a chronic pain clinic.     Chronic Pain Management Managing chronic pain is not easy. The goal is to provide as much pain relief as possible. There are emotional as well as physical problems. Chronic pain may lead to symptoms of depression which magnify those of the pain. Problems may include:  Anxiety.  Sleep disturbances.  Confused thinking.  Feeling cranky.  Fatigue.  Weight gain or loss. Identify the source of the pain first, if possible. The pain may be masking another problem. Try to find a pain management specialist or clinic. Work with a team to create a treatment plan for you. MEDICATIONS  May include narcotics or opioids. Larger than normal doses may be needed to control your pain.  Drugs for depression may help.  Over-the-counter medicines may help for some conditions. These drugs may be used along with others for better pain relief.  May be injected into sites such as the spine and joints. Injections may have to be repeated if they wear off. THERAPY MAY INCLUDE:  Working with a physical therapist to keep from getting stiff.  Regular, gentle exercise.  Cognitive or behavioral therapy.  Using complementary or integrative medicine such as:  Acupuncture.  Massage, Reiki, or Rolfing.  Aroma, color, light, or sound therapy.  Group support. FOR MORE INFORMATION [email or web address]. American Chronic Pain Association [email or web address]. Document Released: 12/28/2004 Document Revised: 02/12/2012 Document Reviewed: 02/06/2008 [facility] Patient Information 2013 [place], [place].  Chronic Pain Discharge Instructions  Emergency care providers appreciate that many patients coming to [place] are in severe pain and we wish to address their pain in the safest, most responsible manner.  It is important to recognize however, that the proper treatment of chronic pain  differs from that of the pain of injuries and acute illnesses.  Our goal is to provide quality, safe, personalized care and we thank you for giving [place] the opportunity to serve you. The use of narcotics and related agents for chronic pain syndromes may lead to additional physical and psychological problems.  Nearly as many people die from prescription narcotics each year as die from car crashes.  Additionally, this risk is increased if such prescriptions are obtained from a variety of sources.  Therefore, only your primary care physician or a pain management specialist is able to safely treat such syndromes with narcotic medications long-term.    Documentation revealing such prescriptions have been sought from multiple sources may prohibit [place] from providing a refill or different narcotic medication.  Your name may be checked first through the [facility] Controlled Substances Reporting System.  This database is a record of controlled substance medication prescriptions that the patient has received.  This has been established by [facility] in an effort to eliminate the dangerous, and often life threatening, practice of obtaining multiple prescriptions from different medical providers.   If you have a chronic pain syndrome (i.e. chronic headaches, recurrent back or neck pain, dental pain, abdominal or pelvis pain without a specific diagnosis, or neuropathic pain such as fibromyalgia) or recurrent visits for the same condition without an acute diagnosis, you may be treated with non-narcotics and other non-addictive medicines.  Allergic reactions or negative side effects that may be reported by a patient to such medications will not typically lead to the use of a narcotic analgesic or other controlled substance as an alternative.   Patients managing chronic pain with a personal physician  should have provisions in place for breakthrough pain.  If you are in crisis, you should call your physician.  If your  physician directs you to the emergency department, please have the doctor call and speak to our attending physician concerning your care.   When patients come to the Emergency Department (ED) with acute medical conditions in which the Emergency Department physician feels appropriate to prescribe narcotic or sedating pain medication, the physician will prescribe these in very limited quantities.  The amount of these medications will last only until you can see your primary care physician in his/her office.  Any patient who returns to the ED seeking refills should expect only non-narcotic pain medications.   In the event of an acute medical condition exists and the emergency physician feels it is necessary that the patient be given a narcotic or sedating medication -  a responsible adult driver should be present in the room prior to the medication being given by the nurse.   Prescriptions for narcotic or sedating medications that have been lost, stolen or expired will not be refilled in the Emergency Department.    Patients who have chronic pain may receive non-narcotic prescriptions until seen by their primary care physician.  It is every patients personal responsibility to maintain active prescriptions with his or her primary care physician or specialist.  If you were a [facility] patient or do not have insurance, here are some clinics in our community that may be able to provide care for free or on sliding payment scales.  [facility], [address], [address], [place], [phone]; Monday to Friday, 9 a.m. - 7 p.m.; Saturday 9 a.m. to 1 p.m.   [facility], [address] [place]; [phone]; or [address]  [place]; [phone].    [facility] [place], [place]. [address], [place]; [phone]; Monday to Wednesday, 8:30 a.m. - 5 p.m.; Thursday, 8:30 a.m. - 8 p.m.   [facility], [address]; [phone]; Monday to Friday, 8 a.m. - 4:30 p.m.    [facility], [place] [address], [place], [phone]; first and third Saturday of the  month, 9:30 a.m. - 12:30 p.m.   [facility], [address] [place], [phone]; second Saturday of the month, 9 a.m. -noon.   RESOURCE GUIDE  Dental Problems  Patients with Medicaid: [facility] [phone]                                                                   [phone] [address] [employer] Phone:  [phone]  Phone:  [phone]  If unable to pay or uninsured, contact:  [facility] or [facility]. to become qualified for the adult dental clinic.  Chronic Pain Problems Contact [facility]  [phone] Patients need to be referred by their primary care doctor.  Insufficient Money for Medicine Contact United Way:  call "211" or [facility] [phone].  No Primary Care Doctor Call [facility]  [phone] Other agencies that provide inexpensive medical care    [facility]  [phone]    [facility]  [phone]    [facility]  [phone]    [facility]  [phone]    [facility]  [phone]  Psychological Services [facility]  [phone] [facility]  [phone] [facility]   [phone] (emergency services [phone])  Abuse/Neglect [facility] Child Abuse Hotline [phone] [facility] Child Abuse Hotline 867-141-4816 (After Hours)  Emergency Shelter [facility] 8325962107  Maternity Homes [facility] at the [place] [facility] [place] [phone] [facility] [phone]  MRSA Hotline #:   210-066-4172  [facility] Resources  [facility]     United Way                           [facility]. [address] [place]                        [address]          [address]  Blondell Reveal Phone:  [phone]                                     Phone:  [phone]                   Phone:  [phone]  [facility] Phone:  [phone]  [facility] Child Abuse Hotline 765-854-0995 2702959322 (After Hours)   Community Resources: *IF YOU ARE IN IMMEDIATE DANGER CALL 911!  Abuse/Neglect:  Family Services Crisis Hotline [facility]): 614-058-6244 [facility]): 240-323-5907  After hours, holidays and weekends: [phone] National Domestic Violence Hotline: 628 694 2206  Mental Health: [facility] Mental Health: Drucie Ip: [phone]  Health Clinics:  [facility]): [phone] Monday - Friday 8 AM - 9 PM, Saturday and Sunday 10 AM - 9 PM   [facility]  [address]: [phone] Monday- Friday 8:30 AM - 5:30 PM, Sat 9 AM - 1 PM  24 HR  Pharmacies [facility] [address]: [phone] [facility]: [phone] [facility] [address]: [phone]  24 HR [facility]: [address] [phone]

## 2013-02-09 ENCOUNTER — Encounter (HOSPITAL_COMMUNITY): Payer: Self-pay | Admitting: Family Medicine

## 2013-02-09 ENCOUNTER — Emergency Department (HOSPITAL_COMMUNITY)
Admission: EM | Admit: 2013-02-09 | Discharge: 2013-02-10 | Disposition: A | Discharge status: Home / Self Care | Payer: Self-pay | Attending: Emergency Medicine | Admitting: Emergency Medicine

## 2013-02-09 DIAGNOSIS — Z8719 Personal history of other diseases of the digestive system: Secondary | ICD-10-CM | POA: Insufficient documentation

## 2013-02-09 DIAGNOSIS — R109 Unspecified abdominal pain: Secondary | ICD-10-CM

## 2013-02-09 DIAGNOSIS — R1013 Epigastric pain: Secondary | ICD-10-CM | POA: Insufficient documentation

## 2013-02-09 DIAGNOSIS — F17200 Nicotine dependence, unspecified, uncomplicated: Secondary | ICD-10-CM | POA: Insufficient documentation

## 2013-02-09 DIAGNOSIS — Z8679 Personal history of other diseases of the circulatory system: Secondary | ICD-10-CM | POA: Insufficient documentation

## 2013-02-09 DIAGNOSIS — Z872 Personal history of diseases of the skin and subcutaneous tissue: Secondary | ICD-10-CM | POA: Insufficient documentation

## 2013-02-09 DIAGNOSIS — K5900 Constipation, unspecified: Secondary | ICD-10-CM | POA: Insufficient documentation

## 2013-02-09 LAB — POCT I-STAT, CHEM 8
BUN: 6 mg/dL (ref 6–23)
Calcium, Ion: 1.12 mmol/L (ref 1.12–1.23)
Chloride: 102 mEq/L (ref 96–112)
Creatinine, Ser: 0.9 mg/dL (ref 0.50–1.35)
Glucose, Bld: 111 mg/dL — ABNORMAL HIGH (ref 70–99)
HCT: 45 % (ref 39.0–52.0)
Hemoglobin: 15.3 g/dL (ref 13.0–17.0)
Potassium: 3.3 mEq/L — ABNORMAL LOW (ref 3.5–5.1)
Sodium: 137 mEq/L (ref 135–145)
TCO2: 27 mmol/L (ref 0–100)

## 2013-02-09 LAB — CG4 I-STAT (LACTIC ACID): Lactic Acid, Venous: 1.34 mmol/L (ref 0.5–2.2)

## 2013-02-09 LAB — OCCULT BLOOD, POC DEVICE: Fecal Occult Bld: NEGATIVE

## 2013-02-09 MED ORDER — ONDANSETRON 4 MG PO TBDP
8.0000 mg | ORAL_TABLET | Freq: Once | ORAL | Status: AC
  Administered 2013-02-09: 8 mg via ORAL
  Filled 2013-02-09: qty 2

## 2013-02-09 MED ORDER — OXYCODONE-ACETAMINOPHEN 5-325 MG PO TABS
2.0000 | ORAL_TABLET | Freq: Once | ORAL | Status: AC
  Administered 2013-02-09: 2 via ORAL
  Filled 2013-02-09: qty 2

## 2013-02-09 MED ORDER — LORAZEPAM 1 MG PO TABS
2.0000 mg | ORAL_TABLET | Freq: Once | ORAL | Status: AC
  Administered 2013-02-09: 2 mg via ORAL
  Filled 2013-02-09: qty 2

## 2013-02-09 NOTE — ED Notes (Signed)
Pt actively throwing up and c/o abdominal pain without relief from PO pain medication

## 2013-02-09 NOTE — ED Notes (Signed)
sts hasn't had a BM in 3 days and cant eat. sts took laxative but didn't work
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
## 2013-02-09 NOTE — ED Notes (Signed)
Report given to Jody,Rn.

## 2013-02-10 MED ORDER — ONDANSETRON 4 MG PO TBDP
8.0000 mg | ORAL_TABLET | Freq: Once | ORAL | Status: AC
  Administered 2013-02-10: 8 mg via ORAL
  Filled 2013-02-10: qty 2

## 2013-02-10 MED ORDER — DOCUSATE SODIUM 100 MG PO CAPS: 100.0000 mg | ORAL_CAPSULE | Freq: Two times a day (BID) | ORAL | Status: DC

## 2013-02-10 NOTE — ED Notes (Signed)
Pt. States "I'm just going to walk home". Told patient about various options for transportation, refused all.
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
## 2013-03-30 ENCOUNTER — Emergency Department (HOSPITAL_COMMUNITY)
Admission: EM | Admit: 2013-03-30 | Discharge: 2013-03-30 | Disposition: A | Discharge status: Home / Self Care | Payer: Self-pay | Attending: Emergency Medicine | Admitting: Emergency Medicine

## 2013-03-30 ENCOUNTER — Encounter (HOSPITAL_COMMUNITY): Payer: Self-pay

## 2013-03-30 DIAGNOSIS — G8929 Other chronic pain: Secondary | ICD-10-CM | POA: Insufficient documentation

## 2013-03-30 DIAGNOSIS — F17200 Nicotine dependence, unspecified, uncomplicated: Secondary | ICD-10-CM | POA: Insufficient documentation

## 2013-03-30 DIAGNOSIS — R1084 Generalized abdominal pain: Secondary | ICD-10-CM | POA: Insufficient documentation

## 2013-03-30 DIAGNOSIS — R109 Unspecified abdominal pain: Secondary | ICD-10-CM

## 2013-03-30 DIAGNOSIS — Z87828 Personal history of other (healed) physical injury and trauma: Secondary | ICD-10-CM | POA: Insufficient documentation

## 2013-03-30 DIAGNOSIS — Z8719 Personal history of other diseases of the digestive system: Secondary | ICD-10-CM | POA: Insufficient documentation

## 2013-03-30 DIAGNOSIS — Z9889 Other specified postprocedural states: Secondary | ICD-10-CM | POA: Insufficient documentation

## 2013-03-30 DIAGNOSIS — Z79899 Other long term (current) drug therapy: Secondary | ICD-10-CM | POA: Insufficient documentation

## 2013-03-30 DIAGNOSIS — Z872 Personal history of diseases of the skin and subcutaneous tissue: Secondary | ICD-10-CM | POA: Insufficient documentation

## 2013-03-30 DIAGNOSIS — Z9089 Acquired absence of other organs: Secondary | ICD-10-CM | POA: Insufficient documentation

## 2013-03-30 LAB — CBC WITH DIFFERENTIAL/PLATELET
Basophils Absolute: 0 10*3/uL (ref 0.0–0.1)
Basophils Relative: 0 % (ref 0–1)
Eosinophils Absolute: 0 10*3/uL (ref 0.0–0.7)
Eosinophils Relative: 0 % (ref 0–5)
HCT: 43.5 % (ref 39.0–52.0)
Hemoglobin: 15.9 g/dL (ref 13.0–17.0)
Lymphocytes Relative: 22 % (ref 12–46)
Lymphs Abs: 2.8 10*3/uL (ref 0.7–4.0)
MCH: 31.5 pg (ref 26.0–34.0)
MCHC: 36.6 g/dL — ABNORMAL HIGH (ref 30.0–36.0)
MCV: 86.1 fL (ref 78.0–100.0)
Monocytes Absolute: 1.1 10*3/uL — ABNORMAL HIGH (ref 0.1–1.0)
Monocytes Relative: 9 % (ref 3–12)
Neutro Abs: 8.6 10*3/uL — ABNORMAL HIGH (ref 1.7–7.7)
Neutrophils Relative %: 69 % (ref 43–77)
Platelets: 196 10*3/uL (ref 150–400)
RBC: 5.05 MIL/uL (ref 4.22–5.81)
RDW: 13 % (ref 11.5–15.5)
WBC: 12.5 10*3/uL — ABNORMAL HIGH (ref 4.0–10.5)

## 2013-03-30 LAB — COMPREHENSIVE METABOLIC PANEL
ALT: 24 U/L (ref 0–53)
AST: 42 U/L — ABNORMAL HIGH (ref 0–37)
Albumin: 4.6 g/dL (ref 3.5–5.2)
Alkaline Phosphatase: 71 U/L (ref 39–117)
BUN: 18 mg/dL (ref 6–23)
CO2: 23 mEq/L (ref 19–32)
Calcium: 10.1 mg/dL (ref 8.4–10.5)
Chloride: 104 mEq/L (ref 96–112)
Creatinine, Ser: 1.13 mg/dL (ref 0.50–1.35)
GFR calc Af Amer: 87 mL/min — ABNORMAL LOW (ref >90)
GFR calc non Af Amer: 75 mL/min — ABNORMAL LOW (ref >90)
Glucose, Bld: 102 mg/dL — ABNORMAL HIGH (ref 70–99)
Potassium: 3.9 mEq/L (ref 3.5–5.1)
Sodium: 141 mEq/L (ref 135–145)
Total Bilirubin: 0.8 mg/dL (ref 0.3–1.2)
Total Protein: 8.6 g/dL — ABNORMAL HIGH (ref 6.0–8.3)

## 2013-03-30 LAB — LIPASE, BLOOD: Lipase: 22 U/L (ref 11–59)

## 2013-03-30 MED ORDER — LORAZEPAM 2 MG/ML IJ SOLN
2.0000 mg | Freq: Once | INTRAMUSCULAR | Status: AC
  Administered 2013-03-30: 2 mg via INTRAVENOUS
  Filled 2013-03-30: qty 1

## 2013-03-30 MED ORDER — KETOROLAC TROMETHAMINE 30 MG/ML IJ SOLN
30.0000 mg | Freq: Once | INTRAMUSCULAR | Status: AC
  Administered 2013-03-30: 30 mg via INTRAVENOUS
  Filled 2013-03-30: qty 1

## 2013-03-30 MED ORDER — SODIUM CHLORIDE 0.9 % IV BOLUS (SEPSIS)
1000.0000 mL | Freq: Once | INTRAVENOUS | Status: AC
  Administered 2013-03-30: 1000 mL via INTRAVENOUS

## 2013-03-30 MED ORDER — ONDANSETRON HCL 4 MG/2ML IJ SOLN
4.0000 mg | Freq: Once | INTRAMUSCULAR | Status: AC
  Administered 2013-03-30: 4 mg via INTRAVENOUS
  Filled 2013-03-30: qty 2

## 2013-03-30 NOTE — ED Notes (Addendum)
Pt arrived [facility] from home for abdominal pain and emesis that started Friday 03/28/13. Pt states he is dehydrated. Diarrhea.

## 2013-03-30 NOTE — ED Notes (Signed)
MD at bedside. 
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
## 2013-03-30 NOTE — ED Notes (Signed)
MD at bedside. Dr. Delo. 

## 2013-03-31 ENCOUNTER — Encounter (HOSPITAL_COMMUNITY): Payer: Self-pay | Admitting: *Deleted

## 2013-03-31 ENCOUNTER — Emergency Department (HOSPITAL_COMMUNITY)
Admission: EM | Admit: 2013-03-31 | Discharge: 2013-03-31 | Disposition: A | Discharge status: Home / Self Care | Payer: Self-pay | Attending: Emergency Medicine | Admitting: Emergency Medicine

## 2013-03-31 DIAGNOSIS — R1033 Periumbilical pain: Secondary | ICD-10-CM | POA: Insufficient documentation

## 2013-03-31 DIAGNOSIS — R112 Nausea with vomiting, unspecified: Secondary | ICD-10-CM | POA: Insufficient documentation

## 2013-03-31 DIAGNOSIS — F911 Conduct disorder, childhood-onset type: Secondary | ICD-10-CM | POA: Insufficient documentation

## 2013-03-31 DIAGNOSIS — Z9889 Other specified postprocedural states: Secondary | ICD-10-CM | POA: Insufficient documentation

## 2013-03-31 DIAGNOSIS — F17200 Nicotine dependence, unspecified, uncomplicated: Secondary | ICD-10-CM | POA: Insufficient documentation

## 2013-03-31 DIAGNOSIS — R197 Diarrhea, unspecified: Secondary | ICD-10-CM | POA: Insufficient documentation

## 2013-03-31 DIAGNOSIS — R109 Unspecified abdominal pain: Secondary | ICD-10-CM

## 2013-03-31 DIAGNOSIS — Z8719 Personal history of other diseases of the digestive system: Secondary | ICD-10-CM | POA: Insufficient documentation

## 2013-03-31 DIAGNOSIS — Z9089 Acquired absence of other organs: Secondary | ICD-10-CM | POA: Insufficient documentation

## 2013-03-31 DIAGNOSIS — G8921 Chronic pain due to trauma: Secondary | ICD-10-CM | POA: Insufficient documentation

## 2013-03-31 DIAGNOSIS — Z872 Personal history of diseases of the skin and subcutaneous tissue: Secondary | ICD-10-CM | POA: Insufficient documentation

## 2013-03-31 LAB — URINALYSIS, ROUTINE W REFLEX MICROSCOPIC
Glucose, UA: NEGATIVE mg/dL
Ketones, ur: 15 mg/dL — AB
Nitrite: NEGATIVE
Protein, ur: NEGATIVE mg/dL
Specific Gravity, Urine: 1.036 — ABNORMAL HIGH (ref 1.005–1.030)
Urobilinogen, UA: 1 mg/dL (ref 0.0–1.0)
pH: 5.5 (ref 5.0–8.0)

## 2013-03-31 LAB — CBC WITH DIFFERENTIAL/PLATELET
Basophils Absolute: 0 10*3/uL (ref 0.0–0.1)
Basophils Relative: 0 % (ref 0–1)
Eosinophils Absolute: 0 10*3/uL (ref 0.0–0.7)
Eosinophils Relative: 0 % (ref 0–5)
HCT: 40.7 % (ref 39.0–52.0)
Hemoglobin: 14.6 g/dL (ref 13.0–17.0)
Lymphocytes Relative: 24 % (ref 12–46)
Lymphs Abs: 2.8 10*3/uL (ref 0.7–4.0)
MCH: 30.9 pg (ref 26.0–34.0)
MCHC: 35.9 g/dL (ref 30.0–36.0)
MCV: 86 fL (ref 78.0–100.0)
Monocytes Absolute: 0.9 10*3/uL (ref 0.1–1.0)
Monocytes Relative: 8 % (ref 3–12)
Neutro Abs: 7.8 10*3/uL — ABNORMAL HIGH (ref 1.7–7.7)
Neutrophils Relative %: 67 % (ref 43–77)
Platelets: 184 10*3/uL (ref 150–400)
RBC: 4.73 MIL/uL (ref 4.22–5.81)
RDW: 12.7 % (ref 11.5–15.5)
WBC: 11.6 10*3/uL — ABNORMAL HIGH (ref 4.0–10.5)

## 2013-03-31 LAB — BASIC METABOLIC PANEL
BUN: 15 mg/dL (ref 6–23)
CO2: 23 mEq/L (ref 19–32)
Calcium: 9.5 mg/dL (ref 8.4–10.5)
Chloride: 101 mEq/L (ref 96–112)
Creatinine, Ser: 0.95 mg/dL (ref 0.50–1.35)
GFR calc Af Amer: >90 mL/min (ref >90)
GFR calc non Af Amer: >90 mL/min (ref >90)
Glucose, Bld: 109 mg/dL — ABNORMAL HIGH (ref 70–99)
Potassium: 3.3 mEq/L — ABNORMAL LOW (ref 3.5–5.1)
Sodium: 135 mEq/L (ref 135–145)

## 2013-03-31 LAB — URINE MICROSCOPIC-ADD ON

## 2013-03-31 LAB — LIPASE, BLOOD: Lipase: 25 U/L (ref 11–59)

## 2013-03-31 MED ORDER — ONDANSETRON HCL 4 MG/2ML IJ SOLN
4.0000 mg | Freq: Once | INTRAMUSCULAR | Status: AC
  Administered 2013-03-31: 4 mg via INTRAVENOUS
  Filled 2013-03-31: qty 2

## 2013-03-31 MED ORDER — LORAZEPAM 2 MG/ML IJ SOLN
1.0000 mg | Freq: Once | INTRAMUSCULAR | Status: AC
  Administered 2013-03-31: 1 mg via INTRAVENOUS
  Filled 2013-03-31: qty 1

## 2013-03-31 MED ORDER — KETOROLAC TROMETHAMINE 30 MG/ML IJ SOLN
30.0000 mg | Freq: Once | INTRAMUSCULAR | Status: AC
  Administered 2013-03-31: 30 mg via INTRAVENOUS
  Filled 2013-03-31: qty 1

## 2013-03-31 MED ORDER — ONDANSETRON 4 MG PO TBDP: 4.0000 mg | ORAL_TABLET | Freq: Three times a day (TID) | ORAL | Status: DC | PRN
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
## 2013-03-31 NOTE — ED Notes (Signed)
IV team called after multiple nurses made multiple attempts to start IV site.

## 2013-03-31 NOTE — ED Notes (Signed)
Pt was seen here last nite with RLQ pain and reports diarrhea without blood for same amount of time (3 days).  Pt states he does vomit if he tries to eat.

## 2013-04-01 NOTE — ED Provider Notes (Signed)
Medical screening examination/treatment/procedure(s) were performed by non-physician practitioner and as supervising physician I was immediately available for consultation/collaboration.   Nathan Beretta, MD 04/01/13 1740 

## 2013-04-26 ENCOUNTER — Emergency Department (HOSPITAL_COMMUNITY)
Admission: EM | Admit: 2013-04-26 | Discharge: 2013-04-27 | Disposition: A | Discharge status: Home / Self Care | Payer: Self-pay | Attending: Emergency Medicine | Admitting: Emergency Medicine

## 2013-04-26 ENCOUNTER — Encounter (HOSPITAL_COMMUNITY): Payer: Self-pay

## 2013-04-26 DIAGNOSIS — G8929 Other chronic pain: Secondary | ICD-10-CM | POA: Insufficient documentation

## 2013-04-26 DIAGNOSIS — Z87828 Personal history of other (healed) physical injury and trauma: Secondary | ICD-10-CM | POA: Insufficient documentation

## 2013-04-26 DIAGNOSIS — Z8719 Personal history of other diseases of the digestive system: Secondary | ICD-10-CM | POA: Insufficient documentation

## 2013-04-26 DIAGNOSIS — Z872 Personal history of diseases of the skin and subcutaneous tissue: Secondary | ICD-10-CM | POA: Insufficient documentation

## 2013-04-26 DIAGNOSIS — R112 Nausea with vomiting, unspecified: Secondary | ICD-10-CM | POA: Insufficient documentation

## 2013-04-26 DIAGNOSIS — Z8639 Personal history of other endocrine, nutritional and metabolic disease: Secondary | ICD-10-CM | POA: Insufficient documentation

## 2013-04-26 DIAGNOSIS — Z9089 Acquired absence of other organs: Secondary | ICD-10-CM | POA: Insufficient documentation

## 2013-04-26 DIAGNOSIS — F17200 Nicotine dependence, unspecified, uncomplicated: Secondary | ICD-10-CM | POA: Insufficient documentation

## 2013-04-26 DIAGNOSIS — R197 Diarrhea, unspecified: Secondary | ICD-10-CM | POA: Insufficient documentation

## 2013-04-26 DIAGNOSIS — Z862 Personal history of diseases of the blood and blood-forming organs and certain disorders involving the immune mechanism: Secondary | ICD-10-CM | POA: Insufficient documentation

## 2013-04-26 DIAGNOSIS — R1013 Epigastric pain: Secondary | ICD-10-CM | POA: Insufficient documentation

## 2013-04-26 DIAGNOSIS — R109 Unspecified abdominal pain: Secondary | ICD-10-CM

## 2013-04-26 DIAGNOSIS — Z9889 Other specified postprocedural states: Secondary | ICD-10-CM | POA: Insufficient documentation

## 2013-04-26 LAB — CBC WITH DIFFERENTIAL/PLATELET
Basophils Absolute: 0 10*3/uL (ref 0.0–0.1)
Basophils Relative: 0 % (ref 0–1)
Eosinophils Absolute: 0.1 10*3/uL (ref 0.0–0.7)
Eosinophils Relative: 1 % (ref 0–5)
HCT: 41.1 % (ref 39.0–52.0)
Hemoglobin: 14.2 g/dL (ref 13.0–17.0)
Lymphocytes Relative: 48 % — ABNORMAL HIGH (ref 12–46)
Lymphs Abs: 4 10*3/uL (ref 0.7–4.0)
MCH: 30.6 pg (ref 26.0–34.0)
MCHC: 34.5 g/dL (ref 30.0–36.0)
MCV: 88.6 fL (ref 78.0–100.0)
Monocytes Absolute: 0.7 10*3/uL (ref 0.1–1.0)
Monocytes Relative: 9 % (ref 3–12)
Neutro Abs: 3.5 10*3/uL (ref 1.7–7.7)
Neutrophils Relative %: 42 % — ABNORMAL LOW (ref 43–77)
Platelets: 170 10*3/uL (ref 150–400)
RBC: 4.64 MIL/uL (ref 4.22–5.81)
RDW: 13.3 % (ref 11.5–15.5)
WBC: 8.3 10*3/uL (ref 4.0–10.5)

## 2013-04-26 LAB — COMPREHENSIVE METABOLIC PANEL
ALT: 24 U/L (ref 0–53)
AST: 24 U/L (ref 0–37)
Albumin: 3.9 g/dL (ref 3.5–5.2)
Alkaline Phosphatase: 64 U/L (ref 39–117)
BUN: 10 mg/dL (ref 6–23)
CO2: 25 mEq/L (ref 19–32)
Calcium: 9.4 mg/dL (ref 8.4–10.5)
Chloride: 100 mEq/L (ref 96–112)
Creatinine, Ser: 1.06 mg/dL (ref 0.50–1.35)
GFR calc Af Amer: >90 mL/min (ref >90)
GFR calc non Af Amer: 81 mL/min — ABNORMAL LOW (ref >90)
Glucose, Bld: 100 mg/dL — ABNORMAL HIGH (ref 70–99)
Potassium: 3.7 mEq/L (ref 3.5–5.1)
Sodium: 137 mEq/L (ref 135–145)
Total Bilirubin: 0.5 mg/dL (ref 0.3–1.2)
Total Protein: 7.2 g/dL (ref 6.0–8.3)

## 2013-04-26 LAB — LIPASE, BLOOD: Lipase: 34 U/L (ref 11–59)

## 2013-04-26 MED ORDER — OXYCODONE-ACETAMINOPHEN 5-325 MG PO TABS
2.0000 | ORAL_TABLET | Freq: Once | ORAL | Status: AC
Start: 2013-04-27 — End: 2013-04-27
  Administered 2013-04-27: 2 via ORAL
  Filled 2013-04-26: qty 2
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
## 2013-04-27 LAB — OCCULT BLOOD, POC DEVICE: Fecal Occult Bld: NEGATIVE

## 2013-04-27 MED ORDER — HYDROMORPHONE HCL PF 1 MG/ML IJ SOLN
INTRAMUSCULAR | Status: AC
  Administered 2013-04-27: 1 mg
  Filled 2013-04-27: qty 1

## 2013-04-27 MED ORDER — ONDANSETRON 8 MG PO TBDP
8.0000 mg | ORAL_TABLET | Freq: Once | ORAL | Status: AC
  Administered 2013-04-27: 8 mg via ORAL

## 2013-04-28 ENCOUNTER — Encounter (HOSPITAL_COMMUNITY): Payer: Self-pay | Admitting: *Deleted

## 2013-04-28 ENCOUNTER — Emergency Department (HOSPITAL_COMMUNITY)
Admission: EM | Admit: 2013-04-28 | Discharge: 2013-04-28 | Disposition: A | Discharge status: Home / Self Care | Payer: Self-pay | Attending: Emergency Medicine | Admitting: Emergency Medicine

## 2013-04-28 DIAGNOSIS — R509 Fever, unspecified: Secondary | ICD-10-CM | POA: Insufficient documentation

## 2013-04-28 DIAGNOSIS — G8929 Other chronic pain: Secondary | ICD-10-CM | POA: Insufficient documentation

## 2013-04-28 DIAGNOSIS — Z9889 Other specified postprocedural states: Secondary | ICD-10-CM | POA: Insufficient documentation

## 2013-04-28 DIAGNOSIS — Z872 Personal history of diseases of the skin and subcutaneous tissue: Secondary | ICD-10-CM | POA: Insufficient documentation

## 2013-04-28 DIAGNOSIS — Z9089 Acquired absence of other organs: Secondary | ICD-10-CM | POA: Insufficient documentation

## 2013-04-28 DIAGNOSIS — Z87828 Personal history of other (healed) physical injury and trauma: Secondary | ICD-10-CM | POA: Insufficient documentation

## 2013-04-28 DIAGNOSIS — R1011 Right upper quadrant pain: Secondary | ICD-10-CM | POA: Insufficient documentation

## 2013-04-28 DIAGNOSIS — Z8719 Personal history of other diseases of the digestive system: Secondary | ICD-10-CM | POA: Insufficient documentation

## 2013-04-28 DIAGNOSIS — F17200 Nicotine dependence, unspecified, uncomplicated: Secondary | ICD-10-CM | POA: Insufficient documentation

## 2013-04-28 MED ORDER — LORAZEPAM 2 MG/ML IJ SOLN: 1.0000 mg | Freq: Once | INTRAMUSCULAR | Status: DC

## 2013-04-28 MED ORDER — KETAMINE HCL 10 MG/ML IJ SOLN: 1.0000 mg/kg | Freq: Once | INTRAMUSCULAR | Status: DC

## 2013-04-28 MED ORDER — HYDROMORPHONE HCL PF 1 MG/ML IJ SOLN: 1.0000 mg | Freq: Once | INTRAMUSCULAR | Status: DC

## 2013-04-28 MED ORDER — SODIUM CHLORIDE 0.9 % IV SOLN: Freq: Once | INTRAVENOUS | Status: DC

## 2013-04-28 MED ORDER — DICYCLOMINE HCL 10 MG/ML IM SOLN
20.0000 mg | Freq: Once | INTRAMUSCULAR | Status: AC
  Administered 2013-04-28: 20 mg via INTRAMUSCULAR
  Filled 2013-04-28: qty 2

## 2013-04-28 MED ORDER — KETAMINE HCL 10 MG/ML IJ SOLN
1.0000 mg/kg | Freq: Once | INTRAMUSCULAR | Status: AC
  Administered 2013-04-28: 68 mg via INTRAVENOUS

## 2013-04-28 MED ORDER — LORAZEPAM 2 MG/ML IJ SOLN
1.0000 mg | Freq: Once | INTRAMUSCULAR | Status: AC
  Administered 2013-04-28: 1 mg via INTRAVENOUS
  Filled 2013-04-28: qty 1

## 2013-04-28 MED ORDER — DICYCLOMINE HCL 10 MG/ML IM SOLN: 20.0000 mg | Freq: Once | INTRAMUSCULAR | Status: DC

## 2013-04-28 MED ORDER — KETOROLAC TROMETHAMINE 30 MG/ML IJ SOLN
30.0000 mg | Freq: Once | INTRAMUSCULAR | Status: AC
  Administered 2013-04-28: 30 mg via INTRAVENOUS
  Filled 2013-04-28: qty 1

## 2013-04-28 MED ORDER — HYDROMORPHONE HCL PF 1 MG/ML IJ SOLN
1.0000 mg | Freq: Once | INTRAMUSCULAR | Status: AC
  Administered 2013-04-28: 1 mg via INTRAVENOUS
  Filled 2013-04-28: qty 1

## 2013-04-28 NOTE — ED Notes (Signed)
MD at bedside. 
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
## 2013-04-28 NOTE — ED Notes (Signed)
Pt reports on going RLQ pain since Friday. Pt returns for further eval for the continued pain. Pain 7/10  In RLQ.

## 2013-04-28 NOTE — ED Provider Notes (Signed)
Medical screening examination/treatment/procedure(s) were performed by non-physician practitioner and as supervising physician I was immediately available for consultation/collaboration.  Sunnie Nielsen, MD 04/28/13 [phone]

## 2013-05-02 ENCOUNTER — Emergency Department (HOSPITAL_COMMUNITY): Payer: Self-pay

## 2013-05-02 ENCOUNTER — Encounter (HOSPITAL_COMMUNITY): Payer: Self-pay | Admitting: *Deleted

## 2013-05-02 ENCOUNTER — Emergency Department (HOSPITAL_COMMUNITY)
Admission: EM | Admit: 2013-05-02 | Discharge: 2013-05-02 | Disposition: A | Discharge status: Home / Self Care | Payer: Self-pay | Attending: Emergency Medicine | Admitting: Emergency Medicine

## 2013-05-02 DIAGNOSIS — G8929 Other chronic pain: Secondary | ICD-10-CM

## 2013-05-02 DIAGNOSIS — Z87828 Personal history of other (healed) physical injury and trauma: Secondary | ICD-10-CM | POA: Insufficient documentation

## 2013-05-02 DIAGNOSIS — R110 Nausea: Secondary | ICD-10-CM | POA: Insufficient documentation

## 2013-05-02 DIAGNOSIS — R109 Unspecified abdominal pain: Secondary | ICD-10-CM | POA: Insufficient documentation

## 2013-05-02 DIAGNOSIS — Z9889 Other specified postprocedural states: Secondary | ICD-10-CM | POA: Insufficient documentation

## 2013-05-02 DIAGNOSIS — F17200 Nicotine dependence, unspecified, uncomplicated: Secondary | ICD-10-CM | POA: Insufficient documentation

## 2013-05-02 DIAGNOSIS — Z872 Personal history of diseases of the skin and subcutaneous tissue: Secondary | ICD-10-CM | POA: Insufficient documentation

## 2013-05-02 DIAGNOSIS — Z9089 Acquired absence of other organs: Secondary | ICD-10-CM | POA: Insufficient documentation

## 2013-05-02 DIAGNOSIS — Z8719 Personal history of other diseases of the digestive system: Secondary | ICD-10-CM | POA: Insufficient documentation

## 2013-05-02 LAB — COMPREHENSIVE METABOLIC PANEL
ALT: 24 U/L (ref 0–53)
AST: 25 U/L (ref 0–37)
Albumin: 3.9 g/dL (ref 3.5–5.2)
Alkaline Phosphatase: 58 U/L (ref 39–117)
BUN: 8 mg/dL (ref 6–23)
CO2: 24 mEq/L (ref 19–32)
Calcium: 9.6 mg/dL (ref 8.4–10.5)
Chloride: 104 mEq/L (ref 96–112)
Creatinine, Ser: 0.93 mg/dL (ref 0.50–1.35)
GFR calc Af Amer: >90 mL/min (ref >90)
GFR calc non Af Amer: >90 mL/min (ref >90)
Glucose, Bld: 104 mg/dL — ABNORMAL HIGH (ref 70–99)
Potassium: 3.9 mEq/L (ref 3.5–5.1)
Sodium: 139 mEq/L (ref 135–145)
Total Bilirubin: 0.3 mg/dL (ref 0.3–1.2)
Total Protein: 7.2 g/dL (ref 6.0–8.3)

## 2013-05-02 LAB — CBC
HCT: 41.7 % (ref 39.0–52.0)
Hemoglobin: 14.6 g/dL (ref 13.0–17.0)
MCH: 30.9 pg (ref 26.0–34.0)
MCHC: 35 g/dL (ref 30.0–36.0)
MCV: 88.2 fL (ref 78.0–100.0)
Platelets: 187 10*3/uL (ref 150–400)
RBC: 4.73 MIL/uL (ref 4.22–5.81)
RDW: 13.1 % (ref 11.5–15.5)
WBC: 8.4 10*3/uL (ref 4.0–10.5)

## 2013-05-02 LAB — LIPASE, BLOOD: Lipase: 36 U/L (ref 11–59)

## 2013-05-02 MED ORDER — KETOROLAC TROMETHAMINE 60 MG/2ML IM SOLN
60.0000 mg | Freq: Once | INTRAMUSCULAR | Status: AC
  Administered 2013-05-02: 60 mg via INTRAMUSCULAR
  Filled 2013-05-02: qty 2

## 2013-05-02 MED ORDER — ONDANSETRON 8 MG PO TBDP: 8.0000 mg | ORAL_TABLET | Freq: Three times a day (TID) | ORAL | Status: DC | PRN

## 2013-05-02 MED ORDER — ACETAMINOPHEN 325 MG PO TABS
650.0000 mg | ORAL_TABLET | Freq: Once | ORAL | Status: AC
  Administered 2013-05-02: 650 mg via ORAL
  Filled 2013-05-02: qty 2

## 2013-05-02 MED ORDER — DICYCLOMINE HCL 10 MG PO CAPS
10.0000 mg | ORAL_CAPSULE | Freq: Once | ORAL | Status: AC
  Administered 2013-05-02: 10 mg via ORAL
  Filled 2013-05-02: qty 1
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
## 2013-05-02 NOTE — ED Notes (Signed)
To ED for eval of continued Abd Pain. Pt was seen at [facility] for same and given scripts that he hasn't filled.
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
## 2013-06-12 ENCOUNTER — Encounter (HOSPITAL_COMMUNITY): Payer: Self-pay

## 2013-06-12 DIAGNOSIS — F17200 Nicotine dependence, unspecified, uncomplicated: Secondary | ICD-10-CM | POA: Insufficient documentation

## 2013-06-12 DIAGNOSIS — R109 Unspecified abdominal pain: Secondary | ICD-10-CM | POA: Insufficient documentation

## 2013-06-12 NOTE — ED Notes (Signed)
Per EMS pt c/o RLQ pain radiating to his back and nausea and vomiting x1 week with increase pain tonight. VSS BP 116/78, HR 60, RR 24, pain 8/10

## 2013-06-12 NOTE — ED Notes (Signed)
PT. ARRIVED WITH EMS REPORTS CHRONIC GENERALIZED ABDOMINAL PAIN FOR SEEVRAL DAYS WITH NAUSEA/VOMITTING , DENIES DIARRHEA , NO FEVER OR CHILLS.

## 2013-06-13 ENCOUNTER — Emergency Department (HOSPITAL_COMMUNITY): Payer: Self-pay

## 2013-06-13 ENCOUNTER — Emergency Department (HOSPITAL_COMMUNITY)
Admission: EM | Admit: 2013-06-13 | Discharge: 2013-06-13 | Discharge status: Against Medical Advice | Payer: Self-pay | Attending: Emergency Medicine | Admitting: Emergency Medicine

## 2013-06-13 ENCOUNTER — Encounter (HOSPITAL_COMMUNITY): Payer: Self-pay

## 2013-06-13 ENCOUNTER — Emergency Department (HOSPITAL_COMMUNITY)
Admission: EM | Admit: 2013-06-13 | Discharge: 2013-06-13 | Disposition: A | Discharge status: Home / Self Care | Payer: Self-pay | Attending: Emergency Medicine | Admitting: Emergency Medicine

## 2013-06-13 DIAGNOSIS — N390 Urinary tract infection, site not specified: Secondary | ICD-10-CM | POA: Insufficient documentation

## 2013-06-13 DIAGNOSIS — R112 Nausea with vomiting, unspecified: Secondary | ICD-10-CM | POA: Insufficient documentation

## 2013-06-13 DIAGNOSIS — R52 Pain, unspecified: Secondary | ICD-10-CM | POA: Insufficient documentation

## 2013-06-13 DIAGNOSIS — Z9889 Other specified postprocedural states: Secondary | ICD-10-CM | POA: Insufficient documentation

## 2013-06-13 DIAGNOSIS — Z87828 Personal history of other (healed) physical injury and trauma: Secondary | ICD-10-CM | POA: Insufficient documentation

## 2013-06-13 DIAGNOSIS — Z8719 Personal history of other diseases of the digestive system: Secondary | ICD-10-CM | POA: Insufficient documentation

## 2013-06-13 DIAGNOSIS — F17200 Nicotine dependence, unspecified, uncomplicated: Secondary | ICD-10-CM | POA: Insufficient documentation

## 2013-06-13 DIAGNOSIS — G8929 Other chronic pain: Secondary | ICD-10-CM | POA: Insufficient documentation

## 2013-06-13 DIAGNOSIS — R300 Dysuria: Secondary | ICD-10-CM | POA: Insufficient documentation

## 2013-06-13 DIAGNOSIS — Z872 Personal history of diseases of the skin and subcutaneous tissue: Secondary | ICD-10-CM | POA: Insufficient documentation

## 2013-06-13 HISTORY — DX: Other chronic pain: G89.29

## 2013-06-13 HISTORY — DX: Unspecified abdominal pain: R10.9

## 2013-06-13 LAB — CBC WITH DIFFERENTIAL/PLATELET
Basophils Absolute: 0 10*3/uL (ref 0.0–0.1)
Basophils Absolute: 0.1 10*3/uL (ref 0.0–0.1)
Basophils Relative: 0 % (ref 0–1)
Basophils Relative: 1 % (ref 0–1)
Eosinophils Absolute: 0.3 10*3/uL (ref 0.0–0.7)
Eosinophils Absolute: 0.2 10*3/uL (ref 0.0–0.7)
Eosinophils Relative: 3 % (ref 0–5)
Eosinophils Relative: 3 % (ref 0–5)
HCT: 43.2 % (ref 39.0–52.0)
HCT: 43 % (ref 39.0–52.0)
Hemoglobin: 15.2 g/dL (ref 13.0–17.0)
Hemoglobin: 15.6 g/dL (ref 13.0–17.0)
Lymphocytes Relative: 40 % (ref 12–46)
Lymphocytes Relative: 37 % (ref 12–46)
Lymphs Abs: 3.9 10*3/uL (ref 0.7–4.0)
Lymphs Abs: 3 10*3/uL (ref 0.7–4.0)
MCH: 31.5 pg (ref 26.0–34.0)
MCH: 32.2 pg (ref 26.0–34.0)
MCHC: 35.2 g/dL (ref 30.0–36.0)
MCHC: 36.3 g/dL — ABNORMAL HIGH (ref 30.0–36.0)
MCV: 89.6 fL (ref 78.0–100.0)
MCV: 88.8 fL (ref 78.0–100.0)
Monocytes Absolute: 0.8 10*3/uL (ref 0.1–1.0)
Monocytes Absolute: 0.6 10*3/uL (ref 0.1–1.0)
Monocytes Relative: 8 % (ref 3–12)
Monocytes Relative: 8 % (ref 3–12)
Neutro Abs: 4.7 10*3/uL (ref 1.7–7.7)
Neutro Abs: 4.1 10*3/uL (ref 1.7–7.7)
Neutrophils Relative %: 49 % (ref 43–77)
Neutrophils Relative %: 52 % (ref 43–77)
Platelets: 230 10*3/uL (ref 150–400)
Platelets: 228 10*3/uL (ref 150–400)
RBC: 4.82 MIL/uL (ref 4.22–5.81)
RBC: 4.84 MIL/uL (ref 4.22–5.81)
RDW: 13.4 % (ref 11.5–15.5)
RDW: 13 % (ref 11.5–15.5)
WBC: 9.7 10*3/uL (ref 4.0–10.5)
WBC: 8 10*3/uL (ref 4.0–10.5)

## 2013-06-13 LAB — COMPREHENSIVE METABOLIC PANEL
ALT: 26 U/L (ref 0–53)
ALT: 33 U/L (ref 0–53)
AST: 24 U/L (ref 0–37)
AST: 32 U/L (ref 0–37)
Albumin: 3.8 g/dL (ref 3.5–5.2)
Albumin: 4.3 g/dL (ref 3.5–5.2)
Alkaline Phosphatase: 62 U/L (ref 39–117)
Alkaline Phosphatase: 64 U/L (ref 39–117)
BUN: 8 mg/dL (ref 6–23)
BUN: 9 mg/dL (ref 6–23)
CO2: 23 mEq/L (ref 19–32)
CO2: 25 mEq/L (ref 19–32)
Calcium: 10.1 mg/dL (ref 8.4–10.5)
Calcium: 9.4 mg/dL (ref 8.4–10.5)
Chloride: 100 mEq/L (ref 96–112)
Chloride: 98 mEq/L (ref 96–112)
Creatinine, Ser: 0.96 mg/dL (ref 0.50–1.35)
Creatinine, Ser: 1 mg/dL (ref 0.50–1.35)
GFR calc Af Amer: >90 mL/min (ref >90)
GFR calc Af Amer: >90 mL/min (ref >90)
GFR calc non Af Amer: 87 mL/min — ABNORMAL LOW (ref >90)
GFR calc non Af Amer: >90 mL/min (ref >90)
Glucose, Bld: 104 mg/dL — ABNORMAL HIGH (ref 70–99)
Glucose, Bld: 93 mg/dL (ref 70–99)
Potassium: 4.1 mEq/L (ref 3.5–5.1)
Potassium: 4.2 mEq/L (ref 3.5–5.1)
Sodium: 133 mEq/L — ABNORMAL LOW (ref 135–145)
Sodium: 135 mEq/L (ref 135–145)
Total Bilirubin: 0.6 mg/dL (ref 0.3–1.2)
Total Bilirubin: 0.6 mg/dL (ref 0.3–1.2)
Total Protein: 7.3 g/dL (ref 6.0–8.3)
Total Protein: 8 g/dL (ref 6.0–8.3)

## 2013-06-13 LAB — URINE MICROSCOPIC-ADD ON

## 2013-06-13 LAB — URINALYSIS, ROUTINE W REFLEX MICROSCOPIC
Glucose, UA: 100 mg/dL — AB
Ketones, ur: 15 mg/dL — AB
Nitrite: NEGATIVE
Protein, ur: 30 mg/dL — AB
Specific Gravity, Urine: 1.037 — ABNORMAL HIGH (ref 1.005–1.030)
Urobilinogen, UA: 1 mg/dL (ref 0.0–1.0)
pH: 5.5 (ref 5.0–8.0)

## 2013-06-13 LAB — LIPASE, BLOOD: Lipase: 27 U/L (ref 11–59)

## 2013-06-13 MED ORDER — KETOROLAC TROMETHAMINE 30 MG/ML IJ SOLN
30.0000 mg | Freq: Once | INTRAMUSCULAR | Status: AC
  Administered 2013-06-13: 30 mg via INTRAVENOUS
  Filled 2013-06-13: qty 1

## 2013-06-13 MED ORDER — NAPROXEN 500 MG PO TABS: 500.0000 mg | ORAL_TABLET | Freq: Two times a day (BID) | ORAL | Status: DC

## 2013-06-13 MED ORDER — SODIUM CHLORIDE 0.9 % IV BOLUS (SEPSIS)
1000.0000 mL | Freq: Once | INTRAVENOUS | Status: AC
  Administered 2013-06-13: 1000 mL via INTRAVENOUS

## 2013-06-13 MED ORDER — ONDANSETRON HCL 4 MG/2ML IJ SOLN
4.0000 mg | Freq: Once | INTRAMUSCULAR | Status: AC
  Administered 2013-06-13: 4 mg via INTRAVENOUS
  Filled 2013-06-13: qty 2

## 2013-06-13 MED ORDER — HYDROMORPHONE HCL PF 1 MG/ML IJ SOLN
1.0000 mg | Freq: Once | INTRAMUSCULAR | Status: AC
  Administered 2013-06-13: 1 mg via INTRAVENOUS
  Filled 2013-06-13: qty 1

## 2013-06-13 MED ORDER — CIPROFLOXACIN HCL 500 MG PO TABS
500.0000 mg | ORAL_TABLET | Freq: Once | ORAL | Status: AC
  Administered 2013-06-13: 500 mg via ORAL
  Filled 2013-06-13: qty 1

## 2013-06-13 MED ORDER — CIPROFLOXACIN HCL 750 MG PO TABS: 750.0000 mg | ORAL_TABLET | Freq: Two times a day (BID) | ORAL | Status: DC

## 2013-06-13 NOTE — ED Provider Notes (Signed)
Medical screening examination/treatment/procedure(s) were performed by non-physician practitioner and as supervising physician I was immediately available for consultation/collaboration.  Shelda Jakes, MD 06/13/13 [phone]
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
## 2013-06-13 NOTE — ED Notes (Signed)
Pt stated after triage he was going to leave, pt not found
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
## 2013-06-21 IMAGING — US US RENAL
1 series · 14 of 25 positions shown · non-contrast
Comparison: Abdominal CT 03/08/2012

CLINICAL DATA: Abdominal pain and urinary tract infection.

RENAL/URINARY TRACT ULTRASOUND COMPLETE

[Series 1: us renal · 0.23mm/px · 14 of 26 slices shown]
[im 1/26]
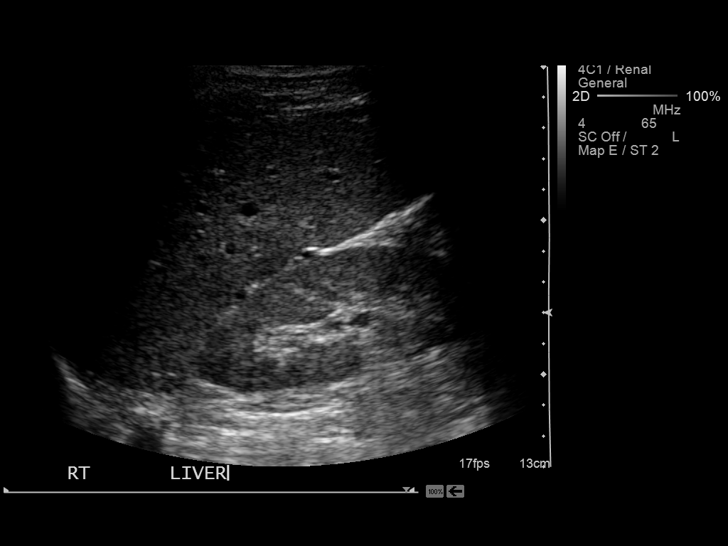
[im 3/26]
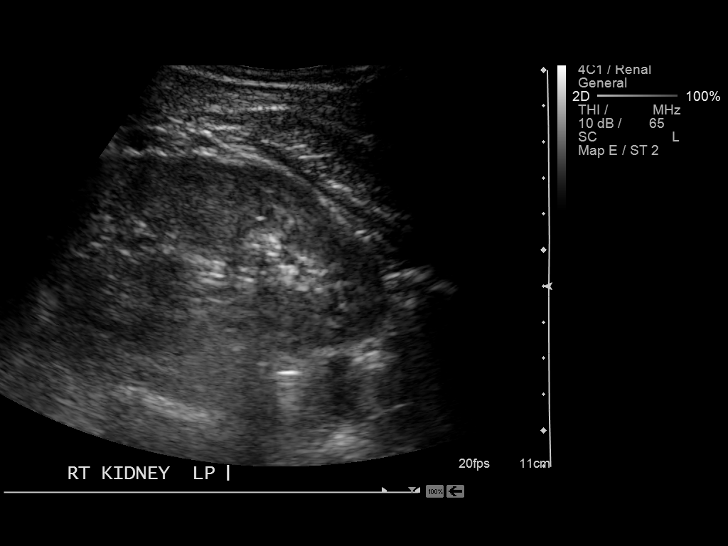
[im 5/26]
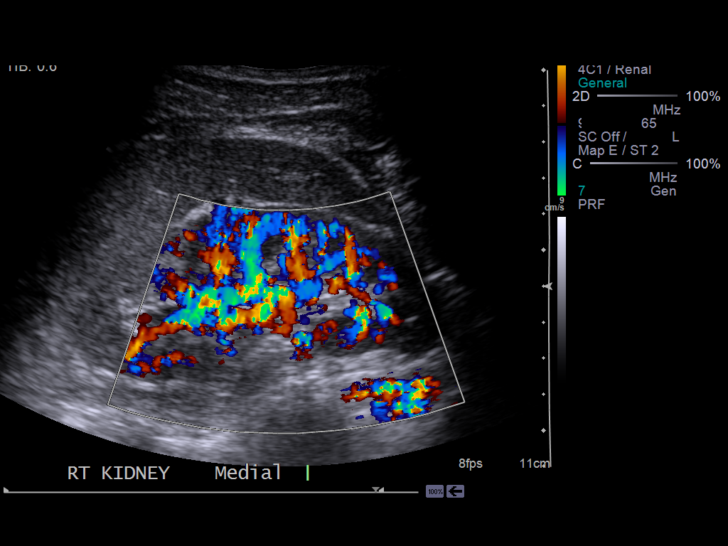
[im 7/26]
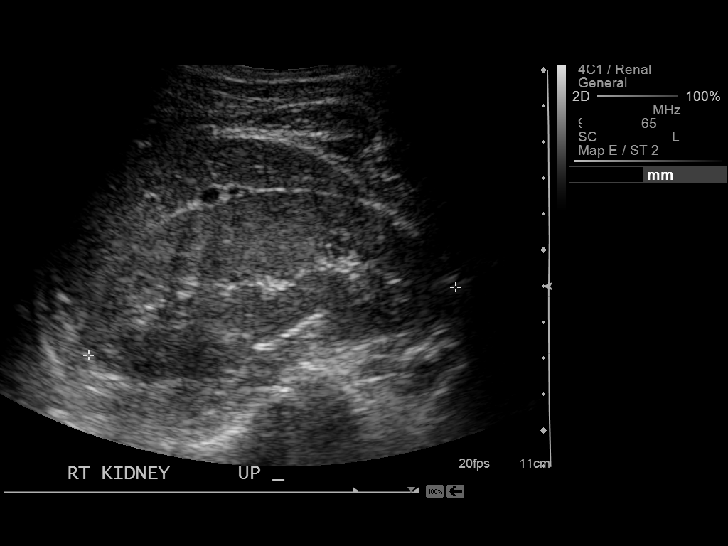
[im 9/26]
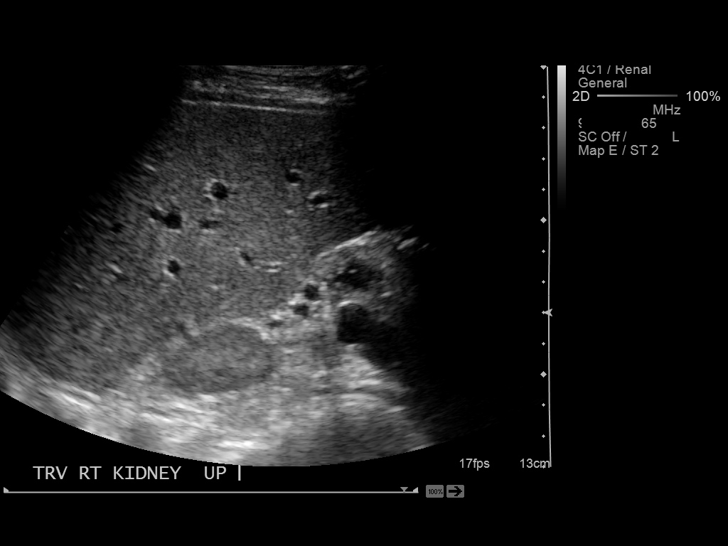
[im 10/26]
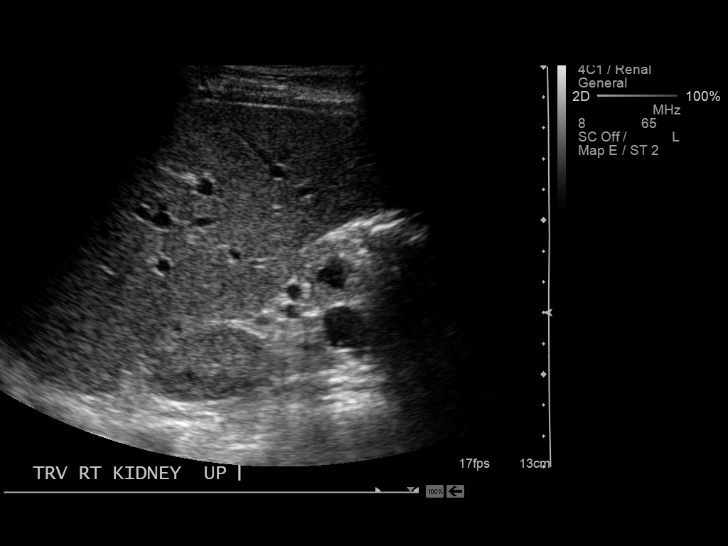
[im 12/26]
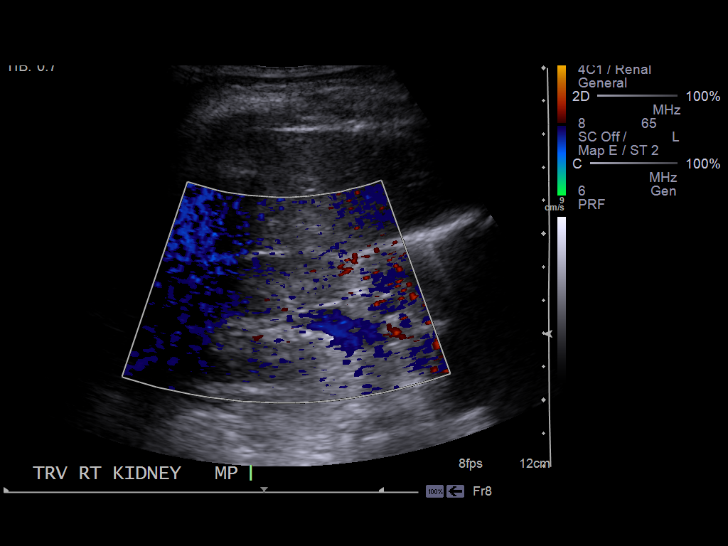
[im 14/26]
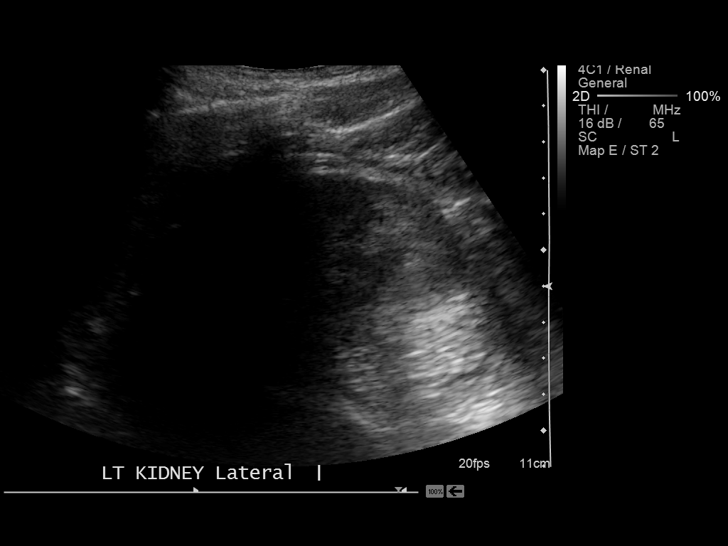
[im 16/26]
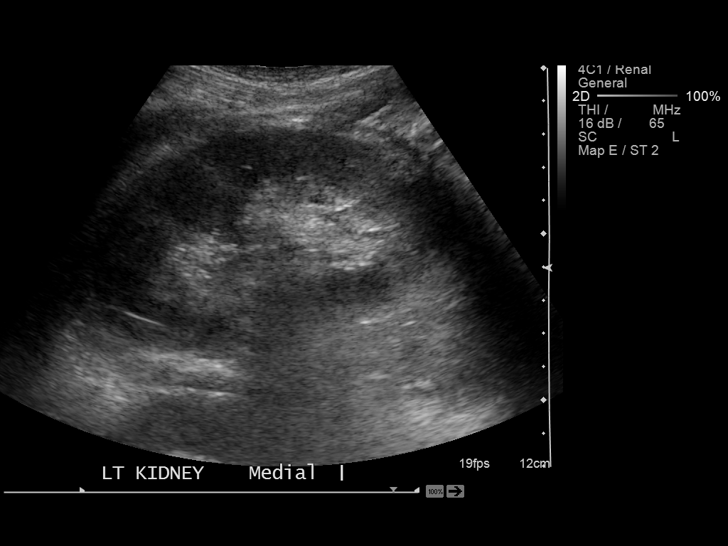
[im 17/26]
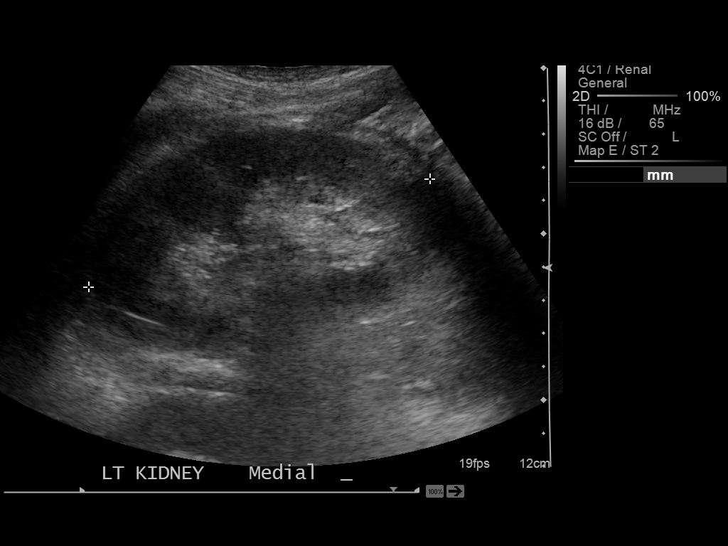
[im 19/26]
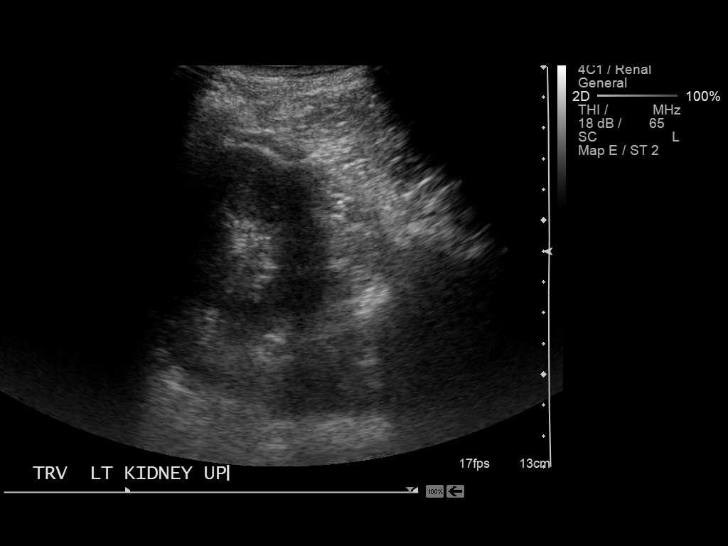
[im 21/26]
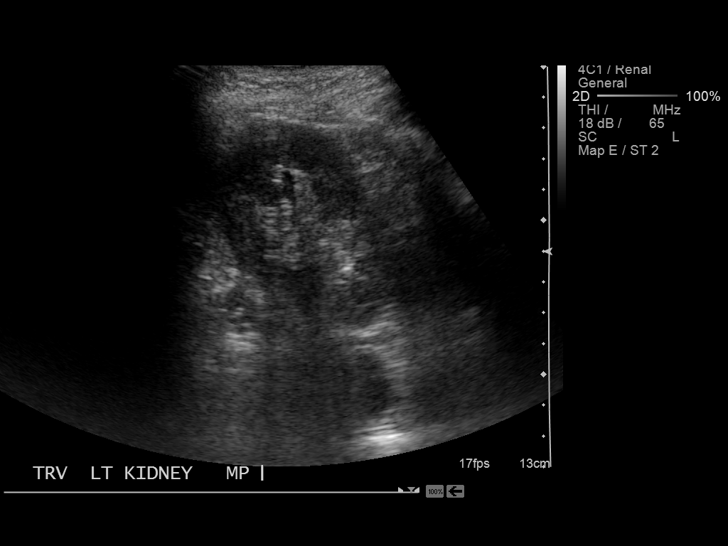
[im 23/26]
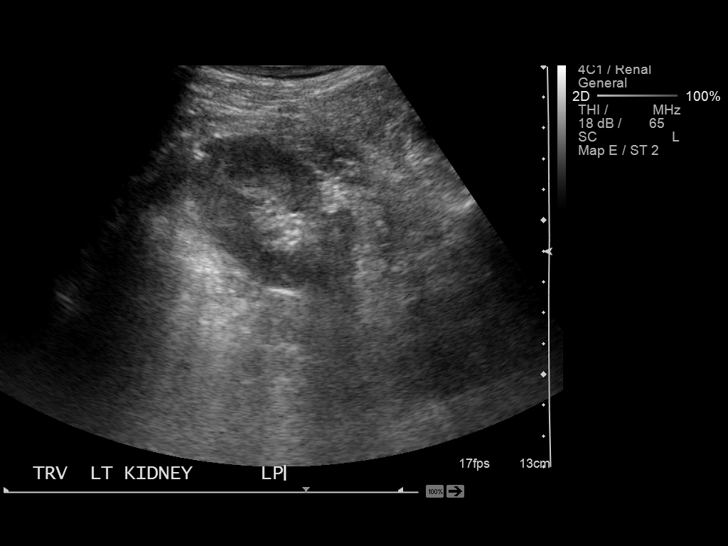
[im 26/26]
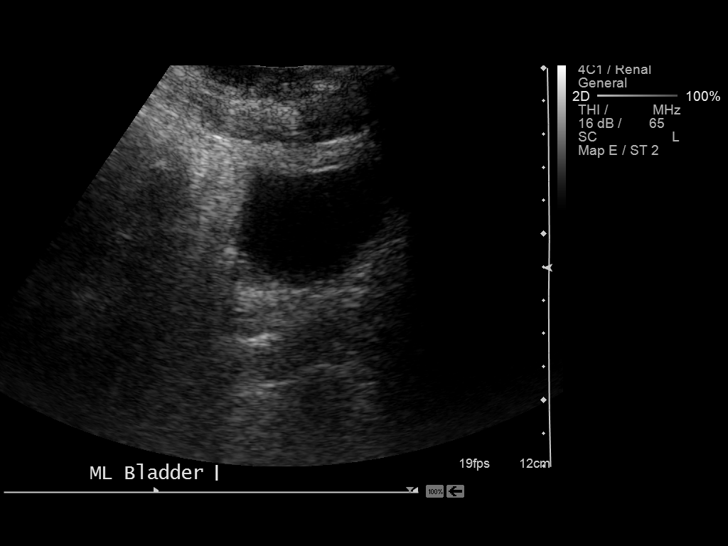

[14 of 25 positions shown; findings below may reference images not displayed]

FINDINGS: Right Kidney:  Right kidney measures 10.4 cm in length.  There is
normal renal echotexture.  Negative for hydronephrosis.

Left Kidney:  The left kidney measures 10.8 cm in length with
normal echotexture.  Negative for hydronephrosis.

Bladder:  A small amount of fluid within the urinary bladder.
IMPRESSION: Normal renal ultrasound.

## 2013-06-22 ENCOUNTER — Encounter (HOSPITAL_COMMUNITY): Payer: Self-pay | Admitting: Emergency Medicine

## 2013-06-22 ENCOUNTER — Emergency Department (HOSPITAL_COMMUNITY)
Admission: EM | Admit: 2013-06-22 | Discharge: 2013-06-22 | Disposition: A | Discharge status: Home / Self Care | Payer: Self-pay | Attending: Emergency Medicine | Admitting: Emergency Medicine

## 2013-06-22 DIAGNOSIS — Z8719 Personal history of other diseases of the digestive system: Secondary | ICD-10-CM | POA: Insufficient documentation

## 2013-06-22 DIAGNOSIS — G8929 Other chronic pain: Secondary | ICD-10-CM | POA: Insufficient documentation

## 2013-06-22 DIAGNOSIS — Z8669 Personal history of other diseases of the nervous system and sense organs: Secondary | ICD-10-CM | POA: Insufficient documentation

## 2013-06-22 DIAGNOSIS — Z87828 Personal history of other (healed) physical injury and trauma: Secondary | ICD-10-CM | POA: Insufficient documentation

## 2013-06-22 DIAGNOSIS — Z872 Personal history of diseases of the skin and subcutaneous tissue: Secondary | ICD-10-CM | POA: Insufficient documentation

## 2013-06-22 DIAGNOSIS — R109 Unspecified abdominal pain: Secondary | ICD-10-CM | POA: Insufficient documentation

## 2013-06-22 DIAGNOSIS — F17200 Nicotine dependence, unspecified, uncomplicated: Secondary | ICD-10-CM | POA: Insufficient documentation

## 2013-06-22 LAB — COMPREHENSIVE METABOLIC PANEL
ALT: 14 U/L (ref 0–53)
AST: 18 U/L (ref 0–37)
Albumin: 3.9 g/dL (ref 3.5–5.2)
Alkaline Phosphatase: 53 U/L (ref 39–117)
BUN: 7 mg/dL (ref 6–23)
CO2: 24 mEq/L (ref 19–32)
Calcium: 9.2 mg/dL (ref 8.4–10.5)
Chloride: 101 mEq/L (ref 96–112)
Creatinine, Ser: 0.96 mg/dL (ref 0.50–1.35)
GFR calc Af Amer: >90 mL/min (ref >90)
GFR calc non Af Amer: >90 mL/min (ref >90)
Glucose, Bld: 101 mg/dL — ABNORMAL HIGH (ref 70–99)
Potassium: 3.6 mEq/L (ref 3.5–5.1)
Sodium: 135 mEq/L (ref 135–145)
Total Bilirubin: 0.5 mg/dL (ref 0.3–1.2)
Total Protein: 6.7 g/dL (ref 6.0–8.3)

## 2013-06-22 LAB — URINALYSIS, ROUTINE W REFLEX MICROSCOPIC
Glucose, UA: NEGATIVE mg/dL
Ketones, ur: 15 mg/dL — AB
Leukocytes, UA: NEGATIVE
Nitrite: NEGATIVE
Protein, ur: NEGATIVE mg/dL
Specific Gravity, Urine: 1.036 — ABNORMAL HIGH (ref 1.005–1.030)
Urobilinogen, UA: 0.2 mg/dL (ref 0.0–1.0)
pH: 5.5 (ref 5.0–8.0)

## 2013-06-22 LAB — CBC WITH DIFFERENTIAL/PLATELET
Basophils Absolute: 0 10*3/uL (ref 0.0–0.1)
Basophils Relative: 0 % (ref 0–1)
Eosinophils Absolute: 0.2 10*3/uL (ref 0.0–0.7)
Eosinophils Relative: 3 % (ref 0–5)
HCT: 38.7 % — ABNORMAL LOW (ref 39.0–52.0)
Hemoglobin: 13.6 g/dL (ref 13.0–17.0)
Lymphocytes Relative: 43 % (ref 12–46)
Lymphs Abs: 2.7 10*3/uL (ref 0.7–4.0)
MCH: 31.3 pg (ref 26.0–34.0)
MCHC: 35.1 g/dL (ref 30.0–36.0)
MCV: 89 fL (ref 78.0–100.0)
Monocytes Absolute: 0.5 10*3/uL (ref 0.1–1.0)
Monocytes Relative: 8 % (ref 3–12)
Neutro Abs: 2.8 10*3/uL (ref 1.7–7.7)
Neutrophils Relative %: 46 % (ref 43–77)
Platelets: 185 10*3/uL (ref 150–400)
RBC: 4.35 MIL/uL (ref 4.22–5.81)
RDW: 12.8 % (ref 11.5–15.5)
WBC: 6.2 10*3/uL (ref 4.0–10.5)

## 2013-06-22 LAB — URINE MICROSCOPIC-ADD ON

## 2013-06-22 LAB — LIPASE, BLOOD: Lipase: 31 U/L (ref 11–59)

## 2013-06-22 MED ORDER — KETOROLAC TROMETHAMINE 30 MG/ML IJ SOLN
30.0000 mg | Freq: Once | INTRAMUSCULAR | Status: AC
  Administered 2013-06-22: 30 mg via INTRAVENOUS
  Filled 2013-06-22: qty 1

## 2013-06-22 MED ORDER — SODIUM CHLORIDE 0.9 % IV BOLUS (SEPSIS)
1000.0000 mL | Freq: Once | INTRAVENOUS | Status: AC
  Administered 2013-06-22: 1000 mL via INTRAVENOUS

## 2013-06-22 MED ORDER — HYDROMORPHONE HCL PF 1 MG/ML IJ SOLN
1.0000 mg | Freq: Once | INTRAMUSCULAR | Status: AC
  Administered 2013-06-22: 1 mg via INTRAVENOUS
  Filled 2013-06-22: qty 1

## 2013-06-22 MED ORDER — ONDANSETRON HCL 4 MG/2ML IJ SOLN
4.0000 mg | Freq: Once | INTRAMUSCULAR | Status: AC
  Administered 2013-06-22: 4 mg via INTRAVENOUS
  Filled 2013-06-22: qty 2
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
## 2013-06-22 NOTE — ED Provider Notes (Signed)
Medical screening examination/treatment/procedure(s) were performed by non-physician practitioner and as supervising physician I was immediately available for consultation/collaboration.  Toy Baker, MD 06/22/13 [phone]

## 2013-06-22 NOTE — Progress Notes (Signed)
Met patient at bedside.Case manager role explained.Patient verbalizes understanding.Patient reports he has increased groin pain.Patient does not have a PCP or health Insurance.Patient provided With verbal education/ resource sheet for the [facility] center on [facility]. Patient educated about the [place] [facility] network [facility] card- program for [employer]. Patient receptive to these resources and aware he will receive an information pack in the mail.No further case management needs identified.

## 2013-06-22 NOTE — ED Notes (Signed)
Pt arrived via EMS. Pt states that he was seen in the department last week and was diagnosed with a UTI. Pt returns today because his pain has gotten worse, and now resides in his groin. Pt states that both his lower right back and groin hurt.

## 2013-06-23 ENCOUNTER — Encounter (HOSPITAL_COMMUNITY): Payer: Self-pay | Admitting: *Deleted

## 2013-06-23 ENCOUNTER — Emergency Department (HOSPITAL_COMMUNITY)
Admission: EM | Admit: 2013-06-23 | Discharge: 2013-06-23 | Disposition: A | Discharge status: Home / Self Care | Payer: Self-pay | Attending: Emergency Medicine | Admitting: Emergency Medicine

## 2013-06-23 DIAGNOSIS — Z872 Personal history of diseases of the skin and subcutaneous tissue: Secondary | ICD-10-CM | POA: Insufficient documentation

## 2013-06-23 DIAGNOSIS — R1011 Right upper quadrant pain: Secondary | ICD-10-CM | POA: Insufficient documentation

## 2013-06-23 DIAGNOSIS — G8929 Other chronic pain: Secondary | ICD-10-CM | POA: Insufficient documentation

## 2013-06-23 DIAGNOSIS — Z8719 Personal history of other diseases of the digestive system: Secondary | ICD-10-CM | POA: Insufficient documentation

## 2013-06-23 DIAGNOSIS — F17200 Nicotine dependence, unspecified, uncomplicated: Secondary | ICD-10-CM | POA: Insufficient documentation

## 2013-06-23 DIAGNOSIS — Z8679 Personal history of other diseases of the circulatory system: Secondary | ICD-10-CM | POA: Insufficient documentation

## 2013-06-23 MED ORDER — FENTANYL CITRATE 0.05 MG/ML IJ SOLN
150.0000 ug | Freq: Once | INTRAMUSCULAR | Status: AC
  Administered 2013-06-23: 150 ug via INTRAMUSCULAR
  Filled 2013-06-23: qty 4

## 2013-06-23 NOTE — ED Notes (Signed)
Per EMS pt from home with c/o abdominal pain. Took Cipro today for his UTI and started to have pain in right upper/lower quadrant. Seen yesterday for same. VSS.
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
## 2013-06-23 NOTE — ED Provider Notes (Signed)
Medical screening examination/treatment/procedure(s) were performed by non-physician practitioner and as supervising physician I was immediately available for consultation/collaboration.   Armida Vickroy, MD 06/23/13 1508 

## 2013-06-25 ENCOUNTER — Encounter (HOSPITAL_COMMUNITY): Payer: Self-pay | Admitting: *Deleted

## 2013-06-25 ENCOUNTER — Emergency Department (HOSPITAL_COMMUNITY)
Admission: EM | Admit: 2013-06-25 | Discharge: 2013-06-26 | Disposition: A | Discharge status: Home / Self Care | Payer: Self-pay | Attending: Emergency Medicine | Admitting: Emergency Medicine

## 2013-06-25 DIAGNOSIS — Z87828 Personal history of other (healed) physical injury and trauma: Secondary | ICD-10-CM | POA: Insufficient documentation

## 2013-06-25 DIAGNOSIS — Z8719 Personal history of other diseases of the digestive system: Secondary | ICD-10-CM | POA: Insufficient documentation

## 2013-06-25 DIAGNOSIS — R109 Unspecified abdominal pain: Secondary | ICD-10-CM

## 2013-06-25 DIAGNOSIS — R112 Nausea with vomiting, unspecified: Secondary | ICD-10-CM | POA: Insufficient documentation

## 2013-06-25 DIAGNOSIS — F17200 Nicotine dependence, unspecified, uncomplicated: Secondary | ICD-10-CM | POA: Insufficient documentation

## 2013-06-25 DIAGNOSIS — G8929 Other chronic pain: Secondary | ICD-10-CM | POA: Insufficient documentation

## 2013-06-25 LAB — URINE MICROSCOPIC-ADD ON

## 2013-06-25 LAB — URINALYSIS, ROUTINE W REFLEX MICROSCOPIC
Glucose, UA: NEGATIVE mg/dL
Ketones, ur: NEGATIVE mg/dL
Leukocytes, UA: NEGATIVE
Nitrite: NEGATIVE
Protein, ur: NEGATIVE mg/dL
Specific Gravity, Urine: 1.037 — ABNORMAL HIGH (ref 1.005–1.030)
Urobilinogen, UA: 0.2 mg/dL (ref 0.0–1.0)
pH: 5.5 (ref 5.0–8.0)

## 2013-06-25 LAB — CBC WITH DIFFERENTIAL/PLATELET
Basophils Absolute: 0.1 10*3/uL (ref 0.0–0.1)
Basophils Relative: 1 % (ref 0–1)
Eosinophils Absolute: 0.4 10*3/uL (ref 0.0–0.7)
Eosinophils Relative: 4 % (ref 0–5)
HCT: 38.7 % — ABNORMAL LOW (ref 39.0–52.0)
Hemoglobin: 13.4 g/dL (ref 13.0–17.0)
Lymphocytes Relative: 48 % — ABNORMAL HIGH (ref 12–46)
Lymphs Abs: 4.7 10*3/uL — ABNORMAL HIGH (ref 0.7–4.0)
MCH: 31.3 pg (ref 26.0–34.0)
MCHC: 34.6 g/dL (ref 30.0–36.0)
MCV: 90.4 fL (ref 78.0–100.0)
Monocytes Absolute: 0.5 10*3/uL (ref 0.1–1.0)
Monocytes Relative: 5 % (ref 3–12)
Neutro Abs: 4.2 10*3/uL (ref 1.7–7.7)
Neutrophils Relative %: 43 % (ref 43–77)
Platelets: 187 10*3/uL (ref 150–400)
RBC: 4.28 MIL/uL (ref 4.22–5.81)
RDW: 13.4 % (ref 11.5–15.5)
WBC: 9.7 10*3/uL (ref 4.0–10.5)

## 2013-06-25 LAB — BASIC METABOLIC PANEL
BUN: 8 mg/dL (ref 6–23)
CO2: 25 mEq/L (ref 19–32)
Calcium: 9.3 mg/dL (ref 8.4–10.5)
Chloride: 105 mEq/L (ref 96–112)
Creatinine, Ser: 0.92 mg/dL (ref 0.50–1.35)
GFR calc Af Amer: >90 mL/min (ref >90)
GFR calc non Af Amer: >90 mL/min (ref >90)
Glucose, Bld: 98 mg/dL (ref 70–99)
Potassium: 3.8 mEq/L (ref 3.5–5.1)
Sodium: 139 mEq/L (ref 135–145)

## 2013-06-25 MED ORDER — KETOROLAC TROMETHAMINE 30 MG/ML IJ SOLN
30.0000 mg | Freq: Once | INTRAMUSCULAR | Status: AC
Start: 2013-06-26 — End: 2013-06-26
  Administered 2013-06-26: 30 mg via INTRAVENOUS
  Filled 2013-06-25: qty 1

## 2013-06-25 MED ORDER — SODIUM CHLORIDE 0.9 % IV BOLUS (SEPSIS)
1000.0000 mL | Freq: Once | INTRAVENOUS | Status: AC
  Administered 2013-06-25: 1000 mL via INTRAVENOUS

## 2013-06-25 MED ORDER — FENTANYL CITRATE 0.05 MG/ML IJ SOLN
100.0000 ug | Freq: Once | INTRAMUSCULAR | Status: AC
  Administered 2013-06-25: 100 ug via INTRAVENOUS
  Filled 2013-06-25: qty 2

## 2013-06-25 NOTE — ED Notes (Signed)
Bed:WA20<BR> Expected date:<BR> Expected time:<BR> Means of arrival:<BR> Comments:<BR> EMS
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
## 2013-06-25 NOTE — ED Notes (Signed)
Pt c/o abd pain. Pt states this has been ongoing for 7 years. Pt was seen in [place] on 06/23/13 for abd pain.

## 2013-06-26 LAB — OCCULT BLOOD, POC DEVICE: Fecal Occult Bld: NEGATIVE

## 2013-06-26 MED ORDER — ONDANSETRON HCL 4 MG/2ML IJ SOLN
INTRAMUSCULAR | Status: AC
  Filled 2013-06-26: qty 2

## 2013-06-26 MED ORDER — ONDANSETRON HCL 4 MG/2ML IJ SOLN
4.0000 mg | INTRAMUSCULAR | Status: AC
  Administered 2013-06-26: 4 mg via INTRAVENOUS

## 2013-06-26 MED ORDER — FENTANYL CITRATE 0.05 MG/ML IJ SOLN
50.0000 ug | Freq: Once | INTRAMUSCULAR | Status: AC
  Administered 2013-06-26: 50 ug via INTRAVENOUS
  Filled 2013-06-26: qty 2

## 2013-06-26 MED ORDER — ONDANSETRON HCL 4 MG PO TABS: 4.0000 mg | ORAL_TABLET | Freq: Four times a day (QID) | ORAL | Status: DC

## 2013-06-30 ENCOUNTER — Emergency Department (HOSPITAL_COMMUNITY)
Admission: EM | Admit: 2013-06-30 | Discharge: 2013-06-30 | Discharge status: Against Medical Advice | Payer: Self-pay | Attending: Emergency Medicine | Admitting: Emergency Medicine

## 2013-06-30 ENCOUNTER — Encounter (HOSPITAL_COMMUNITY): Payer: Self-pay | Admitting: Emergency Medicine

## 2013-06-30 DIAGNOSIS — Z8669 Personal history of other diseases of the nervous system and sense organs: Secondary | ICD-10-CM | POA: Insufficient documentation

## 2013-06-30 DIAGNOSIS — R109 Unspecified abdominal pain: Secondary | ICD-10-CM | POA: Insufficient documentation

## 2013-06-30 DIAGNOSIS — Z8719 Personal history of other diseases of the digestive system: Secondary | ICD-10-CM | POA: Insufficient documentation

## 2013-06-30 DIAGNOSIS — G8929 Other chronic pain: Secondary | ICD-10-CM | POA: Insufficient documentation

## 2013-06-30 NOTE — ED Notes (Addendum)
Pt came to front desk requesting a blanket and wanting to know how much longer. I explained to pt I would not be able to give him an actual time frame but we were working hard to get all pt's back. Pt began yelling at this RN stating "I got too much damn pain for all this, I've been waiting 4 hours already."

## 2013-06-30 NOTE — ED Notes (Signed)
Pt seen leaving the emergency department

## 2013-06-30 NOTE — ED Notes (Signed)
EMS stated, pt. Stated, He has had abdominal pain since last night.  IBS type symptoms for years. Worse when I eat and i have dark stool

## 2013-07-01 ENCOUNTER — Emergency Department (HOSPITAL_COMMUNITY)
Admission: EM | Admit: 2013-07-01 | Discharge: 2013-07-02 | Disposition: A | Discharge status: Home / Self Care | Payer: Self-pay | Attending: Emergency Medicine | Admitting: Emergency Medicine

## 2013-07-01 ENCOUNTER — Encounter (HOSPITAL_COMMUNITY): Payer: Self-pay | Admitting: Emergency Medicine

## 2013-07-01 DIAGNOSIS — F17200 Nicotine dependence, unspecified, uncomplicated: Secondary | ICD-10-CM | POA: Insufficient documentation

## 2013-07-01 DIAGNOSIS — Z872 Personal history of diseases of the skin and subcutaneous tissue: Secondary | ICD-10-CM | POA: Insufficient documentation

## 2013-07-01 DIAGNOSIS — Z8639 Personal history of other endocrine, nutritional and metabolic disease: Secondary | ICD-10-CM | POA: Insufficient documentation

## 2013-07-01 DIAGNOSIS — R52 Pain, unspecified: Secondary | ICD-10-CM | POA: Insufficient documentation

## 2013-07-01 DIAGNOSIS — G8929 Other chronic pain: Secondary | ICD-10-CM | POA: Insufficient documentation

## 2013-07-01 DIAGNOSIS — Z9089 Acquired absence of other organs: Secondary | ICD-10-CM | POA: Insufficient documentation

## 2013-07-01 DIAGNOSIS — E871 Hypo-osmolality and hyponatremia: Secondary | ICD-10-CM | POA: Insufficient documentation

## 2013-07-01 DIAGNOSIS — R197 Diarrhea, unspecified: Secondary | ICD-10-CM | POA: Insufficient documentation

## 2013-07-01 DIAGNOSIS — R109 Unspecified abdominal pain: Secondary | ICD-10-CM | POA: Insufficient documentation

## 2013-07-01 DIAGNOSIS — Z87828 Personal history of other (healed) physical injury and trauma: Secondary | ICD-10-CM | POA: Insufficient documentation

## 2013-07-01 DIAGNOSIS — Z862 Personal history of diseases of the blood and blood-forming organs and certain disorders involving the immune mechanism: Secondary | ICD-10-CM | POA: Insufficient documentation

## 2013-07-01 DIAGNOSIS — Z8719 Personal history of other diseases of the digestive system: Secondary | ICD-10-CM | POA: Insufficient documentation

## 2013-07-01 DIAGNOSIS — Z9889 Other specified postprocedural states: Secondary | ICD-10-CM | POA: Insufficient documentation

## 2013-07-01 DIAGNOSIS — Z87440 Personal history of urinary (tract) infections: Secondary | ICD-10-CM | POA: Insufficient documentation

## 2013-07-01 LAB — CBC WITH DIFFERENTIAL/PLATELET
Basophils Absolute: 0 10*3/uL (ref 0.0–0.1)
Basophils Relative: 1 % (ref 0–1)
Eosinophils Absolute: 0.2 10*3/uL (ref 0.0–0.7)
Eosinophils Relative: 2 % (ref 0–5)
HCT: 42.9 % (ref 39.0–52.0)
Hemoglobin: 15.8 g/dL (ref 13.0–17.0)
Lymphocytes Relative: 42 % (ref 12–46)
Lymphs Abs: 3.6 10*3/uL (ref 0.7–4.0)
MCH: 32.6 pg (ref 26.0–34.0)
MCHC: 36.8 g/dL — ABNORMAL HIGH (ref 30.0–36.0)
MCV: 88.5 fL (ref 78.0–100.0)
Monocytes Absolute: 0.7 10*3/uL (ref 0.1–1.0)
Monocytes Relative: 8 % (ref 3–12)
Neutro Abs: 4.1 10*3/uL (ref 1.7–7.7)
Neutrophils Relative %: 48 % (ref 43–77)
Platelets: 211 10*3/uL (ref 150–400)
RBC: 4.85 MIL/uL (ref 4.22–5.81)
RDW: 13.1 % (ref 11.5–15.5)
WBC: 8.7 10*3/uL (ref 4.0–10.5)

## 2013-07-01 LAB — COMPREHENSIVE METABOLIC PANEL
ALT: 42 U/L (ref 0–53)
AST: 30 U/L (ref 0–37)
Albumin: 4.2 g/dL (ref 3.5–5.2)
Alkaline Phosphatase: 60 U/L (ref 39–117)
BUN: 17 mg/dL (ref 6–23)
CO2: 22 mEq/L (ref 19–32)
Calcium: 9.7 mg/dL (ref 8.4–10.5)
Chloride: 97 mEq/L (ref 96–112)
Creatinine, Ser: 0.96 mg/dL (ref 0.50–1.35)
GFR calc Af Amer: >90 mL/min (ref >90)
GFR calc non Af Amer: >90 mL/min (ref >90)
Glucose, Bld: 101 mg/dL — ABNORMAL HIGH (ref 70–99)
Potassium: 4.4 mEq/L (ref 3.5–5.1)
Sodium: 132 mEq/L — ABNORMAL LOW (ref 135–145)
Total Bilirubin: 0.3 mg/dL (ref 0.3–1.2)
Total Protein: 7.6 g/dL (ref 6.0–8.3)

## 2013-07-01 LAB — LIPASE, BLOOD: Lipase: 34 U/L (ref 11–59)

## 2013-07-01 NOTE — ED Notes (Addendum)
PT. ARRIVED WITH [facility] REPORTS GENERALIZED ABDOMINAL PAIN WITH BILATERAL LEG CRAMPING FOR SEVERAL DAYS . PT. SEEN HERE YESTERDAY LEFT AMA .

## 2013-07-02 LAB — URINALYSIS, ROUTINE W REFLEX MICROSCOPIC
Glucose, UA: NEGATIVE mg/dL
Ketones, ur: 15 mg/dL — AB
Nitrite: NEGATIVE
Protein, ur: NEGATIVE mg/dL
Specific Gravity, Urine: 1.03 (ref 1.005–1.030)
Urobilinogen, UA: 0.2 mg/dL (ref 0.0–1.0)
pH: 5.5 (ref 5.0–8.0)

## 2013-07-02 LAB — URINE MICROSCOPIC-ADD ON

## 2013-07-02 MED ORDER — SODIUM CHLORIDE 0.9 % IV SOLN: 1000.0000 mL | INTRAVENOUS | Status: DC

## 2013-07-02 MED ORDER — SODIUM CHLORIDE 0.9 % IV SOLN
1000.0000 mL | Freq: Once | INTRAVENOUS | Status: AC
  Administered 2013-07-02: 1000 mL via INTRAVENOUS

## 2013-07-02 MED ORDER — FENTANYL CITRATE 0.05 MG/ML IJ SOLN
100.0000 ug | Freq: Once | INTRAMUSCULAR | Status: AC
  Administered 2013-07-02: 100 ug via INTRAVENOUS
  Filled 2013-07-02: qty 2

## 2013-07-02 NOTE — ED Notes (Signed)
Last PO intake 2100. (denies: nvd fever, bleeding, constipation), c/o abd pain sharp & cramping, pt restless & writhing.
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
## 2013-07-03 ENCOUNTER — Emergency Department (HOSPITAL_COMMUNITY): Payer: Self-pay

## 2013-07-03 ENCOUNTER — Emergency Department (HOSPITAL_COMMUNITY)
Admission: EM | Admit: 2013-07-03 | Discharge: 2013-07-03 | Disposition: A | Discharge status: Home / Self Care | Payer: Self-pay | Attending: Emergency Medicine | Admitting: Emergency Medicine

## 2013-07-03 ENCOUNTER — Encounter (HOSPITAL_COMMUNITY): Payer: Self-pay | Admitting: Emergency Medicine

## 2013-07-03 DIAGNOSIS — Z87828 Personal history of other (healed) physical injury and trauma: Secondary | ICD-10-CM | POA: Insufficient documentation

## 2013-07-03 DIAGNOSIS — R300 Dysuria: Secondary | ICD-10-CM | POA: Insufficient documentation

## 2013-07-03 DIAGNOSIS — G8929 Other chronic pain: Secondary | ICD-10-CM | POA: Insufficient documentation

## 2013-07-03 DIAGNOSIS — Z8719 Personal history of other diseases of the digestive system: Secondary | ICD-10-CM | POA: Insufficient documentation

## 2013-07-03 DIAGNOSIS — R109 Unspecified abdominal pain: Secondary | ICD-10-CM

## 2013-07-03 DIAGNOSIS — R1031 Right lower quadrant pain: Secondary | ICD-10-CM | POA: Insufficient documentation

## 2013-07-03 DIAGNOSIS — Z9889 Other specified postprocedural states: Secondary | ICD-10-CM | POA: Insufficient documentation

## 2013-07-03 DIAGNOSIS — Z872 Personal history of diseases of the skin and subcutaneous tissue: Secondary | ICD-10-CM | POA: Insufficient documentation

## 2013-07-03 DIAGNOSIS — Z8739 Personal history of other diseases of the musculoskeletal system and connective tissue: Secondary | ICD-10-CM | POA: Insufficient documentation

## 2013-07-03 DIAGNOSIS — F17200 Nicotine dependence, unspecified, uncomplicated: Secondary | ICD-10-CM | POA: Insufficient documentation

## 2013-07-03 LAB — COMPREHENSIVE METABOLIC PANEL
ALT: 40 U/L (ref 0–53)
AST: 27 U/L (ref 0–37)
Albumin: 4.1 g/dL (ref 3.5–5.2)
Alkaline Phosphatase: 60 U/L (ref 39–117)
BUN: 11 mg/dL (ref 6–23)
CO2: 23 mEq/L (ref 19–32)
Calcium: 9.8 mg/dL (ref 8.4–10.5)
Chloride: 103 mEq/L (ref 96–112)
Creatinine, Ser: 0.93 mg/dL (ref 0.50–1.35)
GFR calc Af Amer: >90 mL/min (ref >90)
GFR calc non Af Amer: >90 mL/min (ref >90)
Glucose, Bld: 103 mg/dL — ABNORMAL HIGH (ref 70–99)
Potassium: 4.2 mEq/L (ref 3.5–5.1)
Sodium: 136 mEq/L (ref 135–145)
Total Bilirubin: 0.5 mg/dL (ref 0.3–1.2)
Total Protein: 7.5 g/dL (ref 6.0–8.3)

## 2013-07-03 LAB — URINALYSIS, ROUTINE W REFLEX MICROSCOPIC
Bilirubin Urine: NEGATIVE
Glucose, UA: NEGATIVE mg/dL
Ketones, ur: NEGATIVE mg/dL
Nitrite: NEGATIVE
Protein, ur: NEGATIVE mg/dL
Specific Gravity, Urine: 1.027 (ref 1.005–1.030)
Urobilinogen, UA: 0.2 mg/dL (ref 0.0–1.0)
pH: 5.5 (ref 5.0–8.0)

## 2013-07-03 LAB — CBC WITH DIFFERENTIAL/PLATELET
Basophils Absolute: 0 10*3/uL (ref 0.0–0.1)
Basophils Relative: 0 % (ref 0–1)
Eosinophils Absolute: 0.3 10*3/uL (ref 0.0–0.7)
Eosinophils Relative: 3 % (ref 0–5)
HCT: 42.5 % (ref 39.0–52.0)
Hemoglobin: 15.3 g/dL (ref 13.0–17.0)
Lymphocytes Relative: 41 % (ref 12–46)
Lymphs Abs: 3 10*3/uL (ref 0.7–4.0)
MCH: 31.9 pg (ref 26.0–34.0)
MCHC: 36 g/dL (ref 30.0–36.0)
MCV: 88.7 fL (ref 78.0–100.0)
Monocytes Absolute: 0.6 10*3/uL (ref 0.1–1.0)
Monocytes Relative: 8 % (ref 3–12)
Neutro Abs: 3.5 10*3/uL (ref 1.7–7.7)
Neutrophils Relative %: 47 % (ref 43–77)
Platelets: 207 10*3/uL (ref 150–400)
RBC: 4.79 MIL/uL (ref 4.22–5.81)
RDW: 13.2 % (ref 11.5–15.5)
WBC: 7.3 10*3/uL (ref 4.0–10.5)

## 2013-07-03 LAB — URINE MICROSCOPIC-ADD ON

## 2013-07-03 LAB — LIPASE, BLOOD: Lipase: 30 U/L (ref 11–59)

## 2013-07-03 LAB — OCCULT BLOOD, POC DEVICE: Fecal Occult Bld: NEGATIVE

## 2013-07-03 MED ORDER — HYDROCODONE-ACETAMINOPHEN 5-325 MG PO TABS: 2.0000 | ORAL_TABLET | Freq: Four times a day (QID) | ORAL | Status: DC | PRN

## 2013-07-03 MED ORDER — SODIUM CHLORIDE 0.9 % IV BOLUS (SEPSIS)
1000.0000 mL | Freq: Once | INTRAVENOUS | Status: AC
  Administered 2013-07-03: 1000 mL via INTRAVENOUS

## 2013-07-03 MED ORDER — FENTANYL CITRATE 0.05 MG/ML IJ SOLN
50.0000 ug | Freq: Once | INTRAMUSCULAR | Status: AC
  Administered 2013-07-03: 50 ug via INTRAVENOUS
  Filled 2013-07-03: qty 2

## 2013-07-03 NOTE — Progress Notes (Signed)
Case manager met patient at [employer] role explained.Patient re-educated on using all his  Resources.Patient reports he lost the resource sheets he was provided with during his last  ED visit.Patient provided with new resource sheet for the [facility] [place] center on [address] and [address]  [address] card.No new case manager needs at this time.
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
## 2013-07-03 NOTE — ED Notes (Signed)
Pt refused the occult blood and prefer a male .Difficult IV access.

## 2013-07-03 NOTE — ED Provider Notes (Signed)
Medical screening examination/treatment/procedure(s) were performed by non-physician practitioner and as supervising physician I was immediately available for consultation/collaboration.  Ethelda Chick, MD 07/03/13 [phone]
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
## 2013-07-05 ENCOUNTER — Emergency Department (HOSPITAL_COMMUNITY): Payer: Self-pay

## 2013-07-05 ENCOUNTER — Emergency Department (HOSPITAL_COMMUNITY)
Admission: EM | Admit: 2013-07-05 | Discharge: 2013-07-05 | Disposition: A | Discharge status: Home / Self Care | Payer: Self-pay | Attending: Emergency Medicine | Admitting: Emergency Medicine

## 2013-07-05 DIAGNOSIS — G8929 Other chronic pain: Secondary | ICD-10-CM | POA: Insufficient documentation

## 2013-07-05 DIAGNOSIS — IMO0002 Reserved for concepts with insufficient information to code with codable children: Secondary | ICD-10-CM | POA: Insufficient documentation

## 2013-07-05 DIAGNOSIS — R112 Nausea with vomiting, unspecified: Secondary | ICD-10-CM | POA: Insufficient documentation

## 2013-07-05 DIAGNOSIS — Z8719 Personal history of other diseases of the digestive system: Secondary | ICD-10-CM | POA: Insufficient documentation

## 2013-07-05 DIAGNOSIS — F17200 Nicotine dependence, unspecified, uncomplicated: Secondary | ICD-10-CM | POA: Insufficient documentation

## 2013-07-05 LAB — CBC WITH DIFFERENTIAL/PLATELET
Basophils Absolute: 0.1 10*3/uL (ref 0.0–0.1)
Basophils Relative: 1 % (ref 0–1)
Eosinophils Absolute: 0.3 10*3/uL (ref 0.0–0.7)
Eosinophils Relative: 4 % (ref 0–5)
HCT: 41.8 % (ref 39.0–52.0)
Hemoglobin: 14.7 g/dL (ref 13.0–17.0)
Lymphocytes Relative: 46 % (ref 12–46)
Lymphs Abs: 4.1 10*3/uL — ABNORMAL HIGH (ref 0.7–4.0)
MCH: 31.6 pg (ref 26.0–34.0)
MCHC: 35.2 g/dL (ref 30.0–36.0)
MCV: 89.9 fL (ref 78.0–100.0)
Monocytes Absolute: 0.6 10*3/uL (ref 0.1–1.0)
Monocytes Relative: 7 % (ref 3–12)
Neutro Abs: 3.9 10*3/uL (ref 1.7–7.7)
Neutrophils Relative %: 43 % (ref 43–77)
Platelets: 201 10*3/uL (ref 150–400)
RBC: 4.65 MIL/uL (ref 4.22–5.81)
RDW: 13.2 % (ref 11.5–15.5)
WBC: 9 10*3/uL (ref 4.0–10.5)

## 2013-07-05 LAB — COMPREHENSIVE METABOLIC PANEL
ALT: 41 U/L (ref 0–53)
AST: 31 U/L (ref 0–37)
Albumin: 4.3 g/dL (ref 3.5–5.2)
Alkaline Phosphatase: 59 U/L (ref 39–117)
BUN: 9 mg/dL (ref 6–23)
CO2: 25 mEq/L (ref 19–32)
Calcium: 9.9 mg/dL (ref 8.4–10.5)
Chloride: 101 mEq/L (ref 96–112)
Creatinine, Ser: 0.97 mg/dL (ref 0.50–1.35)
GFR calc Af Amer: >90 mL/min (ref >90)
GFR calc non Af Amer: >90 mL/min (ref >90)
Glucose, Bld: 109 mg/dL — ABNORMAL HIGH (ref 70–99)
Potassium: 4 mEq/L (ref 3.5–5.1)
Sodium: 137 mEq/L (ref 135–145)
Total Bilirubin: 0.5 mg/dL (ref 0.3–1.2)
Total Protein: 7.7 g/dL (ref 6.0–8.3)

## 2013-07-05 LAB — URINALYSIS, ROUTINE W REFLEX MICROSCOPIC
Bilirubin Urine: NEGATIVE
Glucose, UA: NEGATIVE mg/dL
Ketones, ur: NEGATIVE mg/dL
Leukocytes, UA: NEGATIVE
Nitrite: NEGATIVE
Protein, ur: NEGATIVE mg/dL
Specific Gravity, Urine: 1.03 (ref 1.005–1.030)
Urobilinogen, UA: 0.2 mg/dL (ref 0.0–1.0)
pH: 5 (ref 5.0–8.0)

## 2013-07-05 LAB — URINE MICROSCOPIC-ADD ON

## 2013-07-05 LAB — CG4 I-STAT (LACTIC ACID): Lactic Acid, Venous: 1.99 mmol/L (ref 0.5–2.2)

## 2013-07-05 LAB — LIPASE, BLOOD: Lipase: 54 U/L (ref 11–59)

## 2013-07-05 MED ORDER — ONDANSETRON HCL 4 MG/2ML IJ SOLN
4.0000 mg | Freq: Once | INTRAMUSCULAR | Status: AC
  Administered 2013-07-05: 4 mg via INTRAVENOUS
  Filled 2013-07-05: qty 2

## 2013-07-05 MED ORDER — LORAZEPAM 2 MG/ML IJ SOLN
1.0000 mg | Freq: Once | INTRAMUSCULAR | Status: AC
  Administered 2013-07-05: 1 mg via INTRAVENOUS
  Filled 2013-07-05: qty 1

## 2013-07-05 MED ORDER — SODIUM CHLORIDE 0.9 % IV BOLUS (SEPSIS)
1000.0000 mL | Freq: Once | INTRAVENOUS | Status: AC
  Administered 2013-07-05: 1000 mL via INTRAVENOUS

## 2013-07-05 MED ORDER — HYDROMORPHONE HCL PF 1 MG/ML IJ SOLN
1.0000 mg | Freq: Once | INTRAMUSCULAR | Status: AC
  Administered 2013-07-05: 1 mg via INTRAVENOUS
  Filled 2013-07-05: qty 1
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
## 2013-07-07 ENCOUNTER — Encounter: Payer: Self-pay | Admitting: Family Medicine

## 2013-07-07 ENCOUNTER — Ambulatory Visit: Payer: No Typology Code available for payment source | Attending: Family Medicine | Admitting: Internal Medicine

## 2013-07-07 VITALS — BP 115/80 | HR 80 | Temp 98.8°F | Resp 16 | Ht 70.0 in | Wt 143.6 lb

## 2013-07-07 DIAGNOSIS — R1011 Right upper quadrant pain: Secondary | ICD-10-CM

## 2013-07-07 DIAGNOSIS — G8929 Other chronic pain: Secondary | ICD-10-CM

## 2013-07-07 MED ORDER — IBUPROFEN 800 MG PO TABS: 800.0000 mg | ORAL_TABLET | Freq: Three times a day (TID) | ORAL | Status: DC | PRN
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
## 2013-07-07 NOTE — Patient Instructions (Signed)
I STRONGLY RECOMMEND THAT YOU STOP SMOKING AND USING ALL TOBACCO / NICOTINE PRODUCTS.  If YOU ARE READY TO QUIT SMOKING PLEASE CALL  Smoking cessation specialist [phone]

## 2013-07-11 ENCOUNTER — Encounter: Payer: Self-pay | Admitting: Family Medicine

## 2013-07-11 ENCOUNTER — Emergency Department (HOSPITAL_COMMUNITY)
Admission: EM | Admit: 2013-07-11 | Discharge: 2013-07-11 | Disposition: A | Discharge status: Home / Self Care | Payer: Self-pay | Attending: Emergency Medicine | Admitting: Emergency Medicine

## 2013-07-11 ENCOUNTER — Encounter (HOSPITAL_COMMUNITY): Payer: Self-pay | Admitting: Emergency Medicine

## 2013-07-11 ENCOUNTER — Ambulatory Visit: Payer: No Typology Code available for payment source | Attending: Family Medicine | Admitting: Internal Medicine

## 2013-07-11 ENCOUNTER — Ambulatory Visit: Payer: Self-pay

## 2013-07-11 VITALS — BP 119/84 | HR 76 | Temp 99.1°F | Resp 20 | Wt 146.0 lb

## 2013-07-11 DIAGNOSIS — R1011 Right upper quadrant pain: Secondary | ICD-10-CM | POA: Insufficient documentation

## 2013-07-11 DIAGNOSIS — R109 Unspecified abdominal pain: Secondary | ICD-10-CM | POA: Insufficient documentation

## 2013-07-11 DIAGNOSIS — E860 Dehydration: Secondary | ICD-10-CM | POA: Insufficient documentation

## 2013-07-11 DIAGNOSIS — R112 Nausea with vomiting, unspecified: Secondary | ICD-10-CM | POA: Insufficient documentation

## 2013-07-11 DIAGNOSIS — Z8719 Personal history of other diseases of the digestive system: Secondary | ICD-10-CM | POA: Insufficient documentation

## 2013-07-11 DIAGNOSIS — R197 Diarrhea, unspecified: Secondary | ICD-10-CM | POA: Insufficient documentation

## 2013-07-11 DIAGNOSIS — Z872 Personal history of diseases of the skin and subcutaneous tissue: Secondary | ICD-10-CM | POA: Insufficient documentation

## 2013-07-11 DIAGNOSIS — G8929 Other chronic pain: Secondary | ICD-10-CM

## 2013-07-11 DIAGNOSIS — F17200 Nicotine dependence, unspecified, uncomplicated: Secondary | ICD-10-CM | POA: Insufficient documentation

## 2013-07-11 LAB — COMPREHENSIVE METABOLIC PANEL
ALT: 37 U/L (ref 0–53)
AST: 32 U/L (ref 0–37)
Albumin: 3.6 g/dL (ref 3.5–5.2)
Alkaline Phosphatase: 48 U/L (ref 39–117)
BUN: 7 mg/dL (ref 6–23)
CO2: 23 mEq/L (ref 19–32)
Calcium: 9.2 mg/dL (ref 8.4–10.5)
Chloride: 106 mEq/L (ref 96–112)
Creatinine, Ser: 0.89 mg/dL (ref 0.50–1.35)
GFR calc Af Amer: >90 mL/min (ref >90)
GFR calc non Af Amer: >90 mL/min (ref >90)
Glucose, Bld: 72 mg/dL (ref 70–99)
Potassium: 4.5 mEq/L (ref 3.5–5.1)
Sodium: 138 mEq/L (ref 135–145)
Total Bilirubin: 0.4 mg/dL (ref 0.3–1.2)
Total Protein: 6.4 g/dL (ref 6.0–8.3)

## 2013-07-11 LAB — URINALYSIS, ROUTINE W REFLEX MICROSCOPIC
Bilirubin Urine: NEGATIVE
Glucose, UA: NEGATIVE mg/dL
Hgb urine dipstick: NEGATIVE
Ketones, ur: 15 mg/dL — AB
Nitrite: NEGATIVE
Protein, ur: NEGATIVE mg/dL
Specific Gravity, Urine: 1.024 (ref 1.005–1.030)
Urobilinogen, UA: 1 mg/dL (ref 0.0–1.0)
pH: 6.5 (ref 5.0–8.0)

## 2013-07-11 LAB — CBC
HCT: 38.5 % — ABNORMAL LOW (ref 39.0–52.0)
Hemoglobin: 13.5 g/dL (ref 13.0–17.0)
MCH: 31.7 pg (ref 26.0–34.0)
MCHC: 35.1 g/dL (ref 30.0–36.0)
MCV: 90.4 fL (ref 78.0–100.0)
Platelets: 185 10*3/uL (ref 150–400)
RBC: 4.26 MIL/uL (ref 4.22–5.81)
RDW: 13.3 % (ref 11.5–15.5)
WBC: 6.9 10*3/uL (ref 4.0–10.5)

## 2013-07-11 LAB — LIPASE, BLOOD: Lipase: 25 U/L (ref 11–59)

## 2013-07-11 LAB — URINE MICROSCOPIC-ADD ON

## 2013-07-11 MED ORDER — KETOROLAC TROMETHAMINE 60 MG/2ML IM SOLN
60.0000 mg | Freq: Once | INTRAMUSCULAR | Status: AC
  Administered 2013-07-11: 60 mg via INTRAMUSCULAR
  Filled 2013-07-11: qty 2

## 2013-07-11 NOTE — ED Notes (Signed)
Phlebotomist and EMT unable to obtain blood sample.  Provider notified.

## 2013-07-11 NOTE — Progress Notes (Signed)
Pt being transferred to [facility] ER in stable condition  via [email or web address] with Shanda Bumps RN

## 2013-07-11 NOTE — ED Notes (Signed)
Pt sts chronic abd pain starting today while walking to appointment at [facility]; [facility] sent pt to ED; pt sts pain typical to normal chronic pain

## 2013-07-11 NOTE — Progress Notes (Signed)
Patient states he was seen on Monday and given ibuprofen for pain, but returns today with worsening abdominal pain; denies nausea, vomiting, diarrhea. States that the ibuprofen did not help and he needs something stronger.
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
## 2013-07-11 NOTE — ED Provider Notes (Signed)
Medical screening examination/treatment/procedure(s) were performed by non-physician practitioner and as supervising physician I was immediately available for consultation/collaboration.  Hailee Hollick K Linker, MD 07/11/13 1755 

## 2013-07-12 ENCOUNTER — Encounter (HOSPITAL_COMMUNITY): Payer: Self-pay | Admitting: Emergency Medicine

## 2013-07-12 ENCOUNTER — Emergency Department (HOSPITAL_COMMUNITY)
Admission: EM | Admit: 2013-07-12 | Discharge: 2013-07-12 | Disposition: A | Discharge status: Home / Self Care | Payer: Self-pay | Attending: Emergency Medicine | Admitting: Emergency Medicine

## 2013-07-12 DIAGNOSIS — R109 Unspecified abdominal pain: Secondary | ICD-10-CM

## 2013-07-12 DIAGNOSIS — Z8719 Personal history of other diseases of the digestive system: Secondary | ICD-10-CM | POA: Insufficient documentation

## 2013-07-12 DIAGNOSIS — R1084 Generalized abdominal pain: Secondary | ICD-10-CM | POA: Insufficient documentation

## 2013-07-12 DIAGNOSIS — R197 Diarrhea, unspecified: Secondary | ICD-10-CM | POA: Insufficient documentation

## 2013-07-12 DIAGNOSIS — G8929 Other chronic pain: Secondary | ICD-10-CM | POA: Insufficient documentation

## 2013-07-12 DIAGNOSIS — F17200 Nicotine dependence, unspecified, uncomplicated: Secondary | ICD-10-CM | POA: Insufficient documentation

## 2013-07-12 DIAGNOSIS — Z8639 Personal history of other endocrine, nutritional and metabolic disease: Secondary | ICD-10-CM | POA: Insufficient documentation

## 2013-07-12 DIAGNOSIS — R6889 Other general symptoms and signs: Secondary | ICD-10-CM | POA: Insufficient documentation

## 2013-07-12 DIAGNOSIS — Z87828 Personal history of other (healed) physical injury and trauma: Secondary | ICD-10-CM | POA: Insufficient documentation

## 2013-07-12 DIAGNOSIS — Z9089 Acquired absence of other organs: Secondary | ICD-10-CM | POA: Insufficient documentation

## 2013-07-12 DIAGNOSIS — Z862 Personal history of diseases of the blood and blood-forming organs and certain disorders involving the immune mechanism: Secondary | ICD-10-CM | POA: Insufficient documentation

## 2013-07-12 DIAGNOSIS — Z9889 Other specified postprocedural states: Secondary | ICD-10-CM | POA: Insufficient documentation

## 2013-07-12 MED ORDER — KETOROLAC TROMETHAMINE 30 MG/ML IJ SOLN
30.0000 mg | Freq: Once | INTRAMUSCULAR | Status: AC
  Administered 2013-07-12: 30 mg via INTRAMUSCULAR
  Filled 2013-07-12: qty 1

## 2013-07-12 NOTE — ED Notes (Signed)
Per EMS: pt from home c/o abd bloating after getting Toradol yesterday; pt sts hx of same; pt seen here multiple times in past for same
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
## 2013-07-15 ENCOUNTER — Emergency Department (HOSPITAL_COMMUNITY)
Admission: EM | Admit: 2013-07-15 | Discharge: 2013-07-15 | Disposition: A | Discharge status: Home / Self Care | Payer: Self-pay | Attending: Emergency Medicine | Admitting: Emergency Medicine

## 2013-07-15 ENCOUNTER — Encounter (HOSPITAL_COMMUNITY): Payer: Self-pay | Admitting: Emergency Medicine

## 2013-07-15 DIAGNOSIS — Z8719 Personal history of other diseases of the digestive system: Secondary | ICD-10-CM | POA: Insufficient documentation

## 2013-07-15 DIAGNOSIS — R109 Unspecified abdominal pain: Secondary | ICD-10-CM | POA: Insufficient documentation

## 2013-07-15 DIAGNOSIS — Z87828 Personal history of other (healed) physical injury and trauma: Secondary | ICD-10-CM | POA: Insufficient documentation

## 2013-07-15 DIAGNOSIS — Z79899 Other long term (current) drug therapy: Secondary | ICD-10-CM | POA: Insufficient documentation

## 2013-07-15 DIAGNOSIS — G8929 Other chronic pain: Secondary | ICD-10-CM

## 2013-07-15 DIAGNOSIS — Z872 Personal history of diseases of the skin and subcutaneous tissue: Secondary | ICD-10-CM | POA: Insufficient documentation

## 2013-07-15 DIAGNOSIS — F17200 Nicotine dependence, unspecified, uncomplicated: Secondary | ICD-10-CM | POA: Insufficient documentation

## 2013-07-15 DIAGNOSIS — Z9089 Acquired absence of other organs: Secondary | ICD-10-CM | POA: Insufficient documentation

## 2013-07-15 LAB — URINALYSIS, ROUTINE W REFLEX MICROSCOPIC
Bilirubin Urine: NEGATIVE
Glucose, UA: NEGATIVE mg/dL
Ketones, ur: NEGATIVE mg/dL
Nitrite: NEGATIVE
Protein, ur: NEGATIVE mg/dL
Specific Gravity, Urine: 1.025 (ref 1.005–1.030)
Urobilinogen, UA: 0.2 mg/dL (ref 0.0–1.0)
pH: 6 (ref 5.0–8.0)

## 2013-07-15 LAB — CBC WITH DIFFERENTIAL/PLATELET
Basophils Absolute: 0 10*3/uL (ref 0.0–0.1)
Basophils Relative: 1 % (ref 0–1)
Eosinophils Absolute: 0.1 10*3/uL (ref 0.0–0.7)
Eosinophils Relative: 2 % (ref 0–5)
HCT: 39.5 % (ref 39.0–52.0)
Hemoglobin: 13.8 g/dL (ref 13.0–17.0)
Lymphocytes Relative: 50 % — ABNORMAL HIGH (ref 12–46)
Lymphs Abs: 3.1 10*3/uL (ref 0.7–4.0)
MCH: 31.2 pg (ref 26.0–34.0)
MCHC: 34.9 g/dL (ref 30.0–36.0)
MCV: 89.2 fL (ref 78.0–100.0)
Monocytes Absolute: 0.4 10*3/uL (ref 0.1–1.0)
Monocytes Relative: 7 % (ref 3–12)
Neutro Abs: 2.5 10*3/uL (ref 1.7–7.7)
Neutrophils Relative %: 41 % — ABNORMAL LOW (ref 43–77)
Platelets: 202 10*3/uL (ref 150–400)
RBC: 4.43 MIL/uL (ref 4.22–5.81)
RDW: 13.2 % (ref 11.5–15.5)
WBC: 6.2 10*3/uL (ref 4.0–10.5)

## 2013-07-15 LAB — POCT I-STAT, CHEM 8
BUN: 7 mg/dL (ref 6–23)
Calcium, Ion: 1.08 mmol/L — ABNORMAL LOW (ref 1.12–1.23)
Chloride: 108 mEq/L (ref 96–112)
Creatinine, Ser: 0.9 mg/dL (ref 0.50–1.35)
Glucose, Bld: 85 mg/dL (ref 70–99)
HCT: 42 % (ref 39.0–52.0)
Hemoglobin: 14.3 g/dL (ref 13.0–17.0)
Potassium: 4.5 mEq/L (ref 3.5–5.1)
Sodium: 139 mEq/L (ref 135–145)
TCO2: 21 mmol/L (ref 0–100)

## 2013-07-15 LAB — URINE MICROSCOPIC-ADD ON

## 2013-07-15 MED ORDER — FENTANYL CITRATE 0.05 MG/ML IJ SOLN
50.0000 ug | Freq: Once | INTRAMUSCULAR | Status: DC
  Filled 2013-07-15: qty 2

## 2013-07-15 MED ORDER — FENTANYL CITRATE 0.05 MG/ML IJ SOLN
25.0000 ug | Freq: Once | INTRAMUSCULAR | Status: AC
  Administered 2013-07-15: 25 ug via INTRAMUSCULAR
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
## 2013-07-15 NOTE — Progress Notes (Signed)
[facility] provided patient with a list of primary care resources.  °

## 2013-07-15 NOTE — ED Notes (Signed)
Pt urine sent and verified by [employer].
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
## 2013-07-15 NOTE — Progress Notes (Signed)
Pt noted with 20 ED visits in the last 6 months for abdominal pain CM reviewed EPIC notes [facility] CM has spoken with pt x 2 in July 2014 and provided with resources  ED CM spoke with the pt who confirms he has been seen on Monday July 14 2013 by his pcp Dr "Standley Dakins" at the adult [facility]care center" and has another appointment on 08/07/13 per pt. He states plans are for surgery "next month" at "[facility] because they take the orange card" versus [facility] facility.  Pt noted during interaction with CM to be lying on his left side in fetal position, rolling back and forth, grimacing and holding his stomach Reports having "shooting pains" The pt stated "there should be more than that" when CM discussed the noted 20 ED visits

## 2013-07-16 ENCOUNTER — Ambulatory Visit: Payer: No Typology Code available for payment source | Attending: Family Medicine

## 2013-07-21 NOTE — ED Provider Notes (Signed)
Medical screening examination/treatment/procedure(s) were performed by non-physician practitioner and as supervising physician I was immediately available for consultation/collaboration.  Flint Melter, MD 07/21/13 [phone]

## 2013-08-04 ENCOUNTER — Emergency Department (HOSPITAL_COMMUNITY)
Admission: EM | Admit: 2013-08-04 | Discharge: 2013-08-04 | Disposition: A | Discharge status: Home / Self Care | Payer: No Typology Code available for payment source | Attending: Emergency Medicine | Admitting: Emergency Medicine

## 2013-08-04 ENCOUNTER — Encounter (HOSPITAL_COMMUNITY): Payer: Self-pay

## 2013-08-04 ENCOUNTER — Emergency Department (HOSPITAL_COMMUNITY): Payer: No Typology Code available for payment source

## 2013-08-04 DIAGNOSIS — G8929 Other chronic pain: Secondary | ICD-10-CM | POA: Insufficient documentation

## 2013-08-04 DIAGNOSIS — R300 Dysuria: Secondary | ICD-10-CM | POA: Insufficient documentation

## 2013-08-04 DIAGNOSIS — Z8719 Personal history of other diseases of the digestive system: Secondary | ICD-10-CM | POA: Insufficient documentation

## 2013-08-04 DIAGNOSIS — Z8669 Personal history of other diseases of the nervous system and sense organs: Secondary | ICD-10-CM | POA: Insufficient documentation

## 2013-08-04 DIAGNOSIS — F17200 Nicotine dependence, unspecified, uncomplicated: Secondary | ICD-10-CM | POA: Insufficient documentation

## 2013-08-04 DIAGNOSIS — R1084 Generalized abdominal pain: Secondary | ICD-10-CM | POA: Insufficient documentation

## 2013-08-04 DIAGNOSIS — M549 Dorsalgia, unspecified: Secondary | ICD-10-CM | POA: Insufficient documentation

## 2013-08-04 DIAGNOSIS — Z888 Allergy status to other drugs, medicaments and biological substances status: Secondary | ICD-10-CM | POA: Insufficient documentation

## 2013-08-04 DIAGNOSIS — Z885 Allergy status to narcotic agent status: Secondary | ICD-10-CM | POA: Insufficient documentation

## 2013-08-04 LAB — URINALYSIS, ROUTINE W REFLEX MICROSCOPIC
Glucose, UA: NEGATIVE mg/dL
Hgb urine dipstick: NEGATIVE
Ketones, ur: 15 mg/dL — AB
Nitrite: NEGATIVE
Protein, ur: NEGATIVE mg/dL
Specific Gravity, Urine: 1.028 (ref 1.005–1.030)
Urobilinogen, UA: 0.2 mg/dL (ref 0.0–1.0)
pH: 5.5 (ref 5.0–8.0)

## 2013-08-04 LAB — URINE MICROSCOPIC-ADD ON

## 2013-08-04 MED ORDER — KETOROLAC TROMETHAMINE 60 MG/2ML IM SOLN
60.0000 mg | Freq: Once | INTRAMUSCULAR | Status: AC
  Administered 2013-08-04: 60 mg via INTRAMUSCULAR
  Filled 2013-08-04: qty 2

## 2013-08-04 MED ORDER — DICYCLOMINE HCL 10 MG/ML IM SOLN
20.0000 mg | Freq: Once | INTRAMUSCULAR | Status: AC
  Administered 2013-08-04: 20 mg via INTRAMUSCULAR
  Filled 2013-08-04: qty 2
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
## 2013-08-05 IMAGING — CR DG ABDOMEN ACUTE W/ 1V CHEST
3 series · 3 of 3 positions shown · non-contrast
Comparison: 06/02/2012

CLINICAL DATA: Right lower quadrant pain.

ACUTE ABDOMEN SERIES (ABDOMEN 2 VIEW & CHEST 1 VIEW)

[w chest pa]
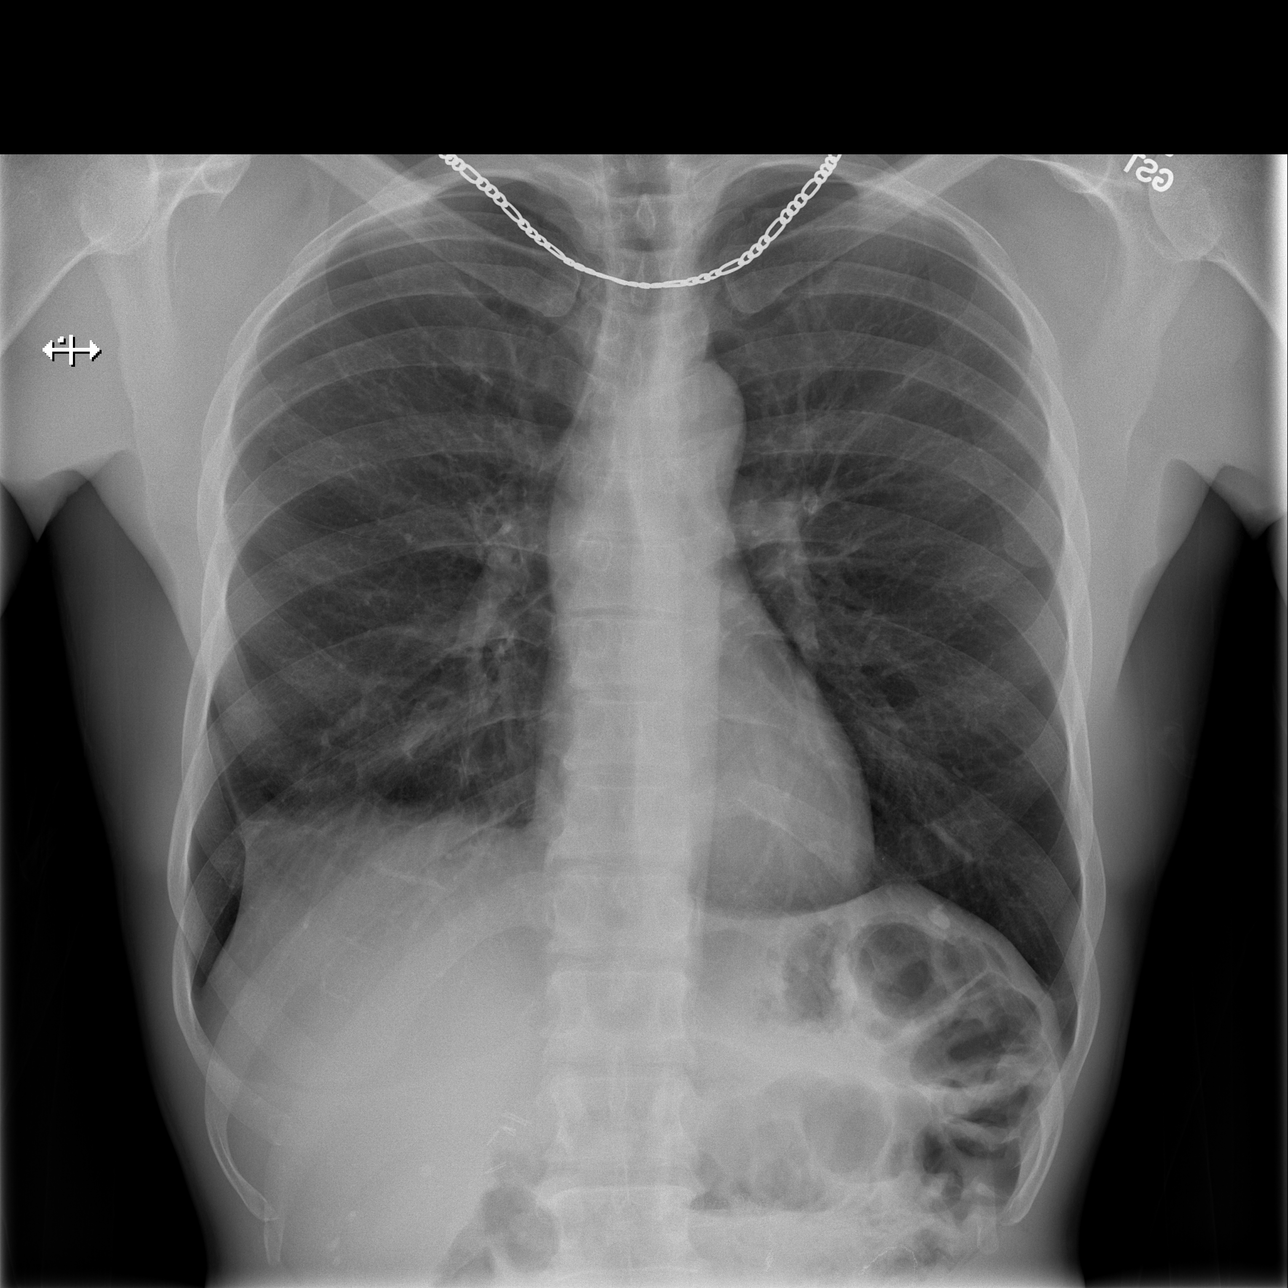

[w abdomen upright]
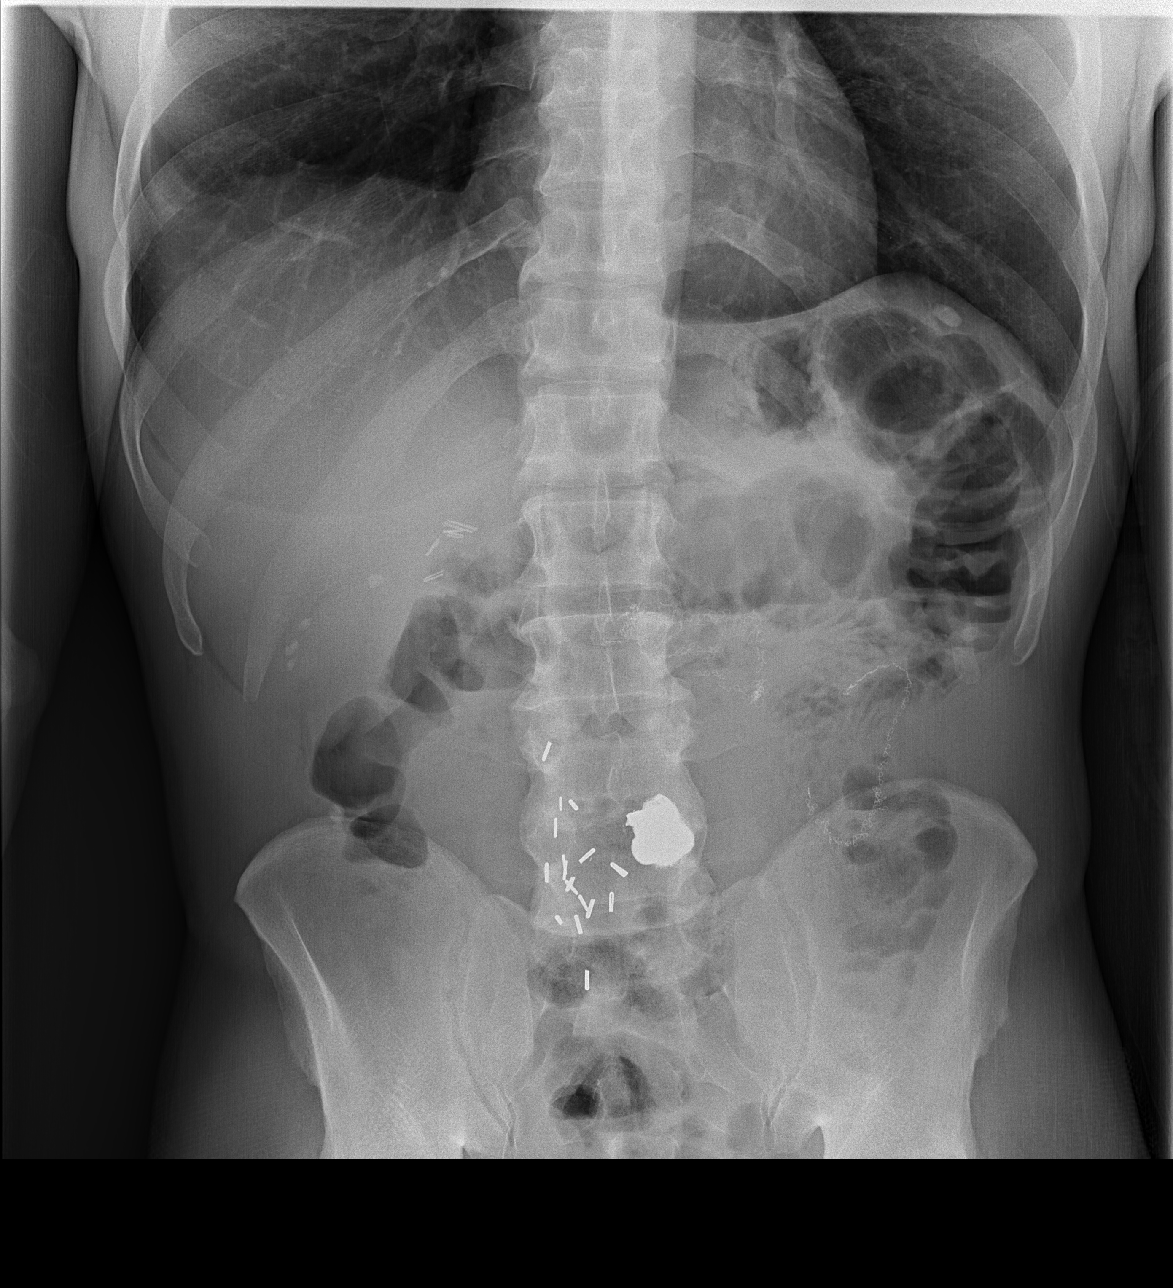

[t abdomen supine]
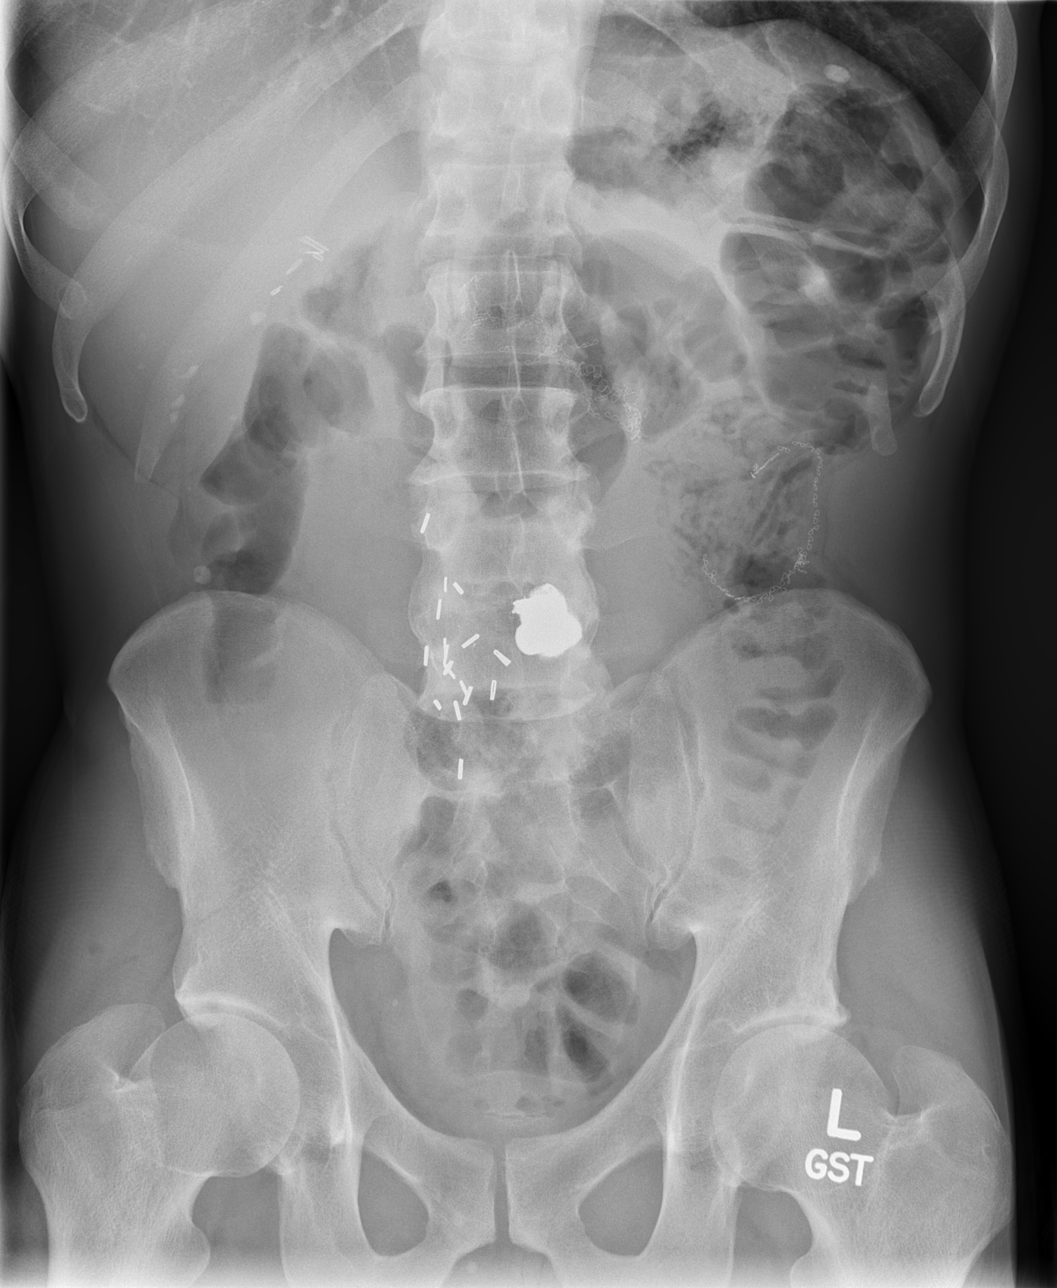

[3 of 3 positions shown; findings below may reference images not displayed]

FINDINGS: Normal heart size and pulmonary vascularity.  No focal
consolidation in the lungs.  No blunting of costophrenic angles.

Scattered gas and stool in the colon.  No small or large bowel
dilatation.  No free intra-abdominal air.  No abnormal air fluid
levels. Degenerative changes in the spine.  Calcification in the
left upper quadrant may represent splenic granuloma.
Calcifications in the right upper quadrant suggest gallstones.
Surgical clips in the right upper quadrant and mid abdomen.
Metallic foreign body in the mid abdomen.
IMPRESSION: No evidence of active pulmonary disease.  Nonobstructive bowel gas
pattern.  No significant change since previous study.

## 2013-08-06 ENCOUNTER — Telehealth: Payer: Self-pay | Admitting: Emergency Medicine

## 2013-08-06 ENCOUNTER — Emergency Department (HOSPITAL_COMMUNITY)
Admission: EM | Admit: 2013-08-06 | Discharge: 2013-08-06 | Disposition: A | Discharge status: Home / Self Care | Payer: No Typology Code available for payment source | Attending: Emergency Medicine | Admitting: Emergency Medicine

## 2013-08-06 ENCOUNTER — Encounter (HOSPITAL_COMMUNITY): Payer: Self-pay

## 2013-08-06 DIAGNOSIS — R109 Unspecified abdominal pain: Secondary | ICD-10-CM

## 2013-08-06 DIAGNOSIS — Z87828 Personal history of other (healed) physical injury and trauma: Secondary | ICD-10-CM | POA: Insufficient documentation

## 2013-08-06 DIAGNOSIS — R112 Nausea with vomiting, unspecified: Secondary | ICD-10-CM | POA: Insufficient documentation

## 2013-08-06 DIAGNOSIS — Z9089 Acquired absence of other organs: Secondary | ICD-10-CM | POA: Insufficient documentation

## 2013-08-06 DIAGNOSIS — F17200 Nicotine dependence, unspecified, uncomplicated: Secondary | ICD-10-CM | POA: Insufficient documentation

## 2013-08-06 DIAGNOSIS — Z8719 Personal history of other diseases of the digestive system: Secondary | ICD-10-CM | POA: Insufficient documentation

## 2013-08-06 DIAGNOSIS — R1084 Generalized abdominal pain: Secondary | ICD-10-CM | POA: Insufficient documentation

## 2013-08-06 DIAGNOSIS — Z872 Personal history of diseases of the skin and subcutaneous tissue: Secondary | ICD-10-CM | POA: Insufficient documentation

## 2013-08-06 DIAGNOSIS — G8929 Other chronic pain: Secondary | ICD-10-CM | POA: Insufficient documentation

## 2013-08-06 MED ORDER — FENTANYL CITRATE 0.05 MG/ML IJ SOLN
50.0000 ug | Freq: Once | INTRAMUSCULAR | Status: AC
  Administered 2013-08-06: 50 ug via INTRAMUSCULAR
  Filled 2013-08-06: qty 2
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
## 2013-08-06 NOTE — Telephone Encounter (Signed)
Patient called stating that he does need refill on pain medication due to having surgery on Monday.  Patient would like a pain medciation,  Patient has appt tomorrow

## 2013-08-06 NOTE — ED Notes (Signed)
ready for discharge

## 2013-08-06 NOTE — Progress Notes (Signed)
[facility] did not get to see patient but will be sending information about the [facility] Orange Card program, using the address provided.  °
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
## 2013-08-06 NOTE — ED Notes (Signed)
Bed: WA20 Expected date:  Expected time:  Means of arrival:  Comments: abd pain 

## 2013-08-07 ENCOUNTER — Ambulatory Visit: Payer: Self-pay

## 2013-08-07 ENCOUNTER — Encounter (HOSPITAL_COMMUNITY): Payer: Self-pay | Admitting: Adult Health

## 2013-08-07 ENCOUNTER — Emergency Department (HOSPITAL_COMMUNITY)
Admission: EM | Admit: 2013-08-07 | Discharge: 2013-08-07 | Discharge status: Against Medical Advice | Payer: No Typology Code available for payment source | Attending: Emergency Medicine | Admitting: Emergency Medicine

## 2013-08-07 DIAGNOSIS — R141 Gas pain: Secondary | ICD-10-CM | POA: Insufficient documentation

## 2013-08-07 DIAGNOSIS — R1033 Periumbilical pain: Secondary | ICD-10-CM | POA: Insufficient documentation

## 2013-08-07 DIAGNOSIS — R142 Eructation: Secondary | ICD-10-CM | POA: Insufficient documentation

## 2013-08-07 LAB — CBC WITH DIFFERENTIAL/PLATELET
Basophils Absolute: 0.1 10*3/uL (ref 0.0–0.1)
Basophils Relative: 1 % (ref 0–1)
Eosinophils Absolute: 0.1 10*3/uL (ref 0.0–0.7)
Eosinophils Relative: 1 % (ref 0–5)
HCT: 38.9 % — ABNORMAL LOW (ref 39.0–52.0)
Hemoglobin: 14.1 g/dL (ref 13.0–17.0)
Lymphocytes Relative: 44 % (ref 12–46)
Lymphs Abs: 3.6 10*3/uL (ref 0.7–4.0)
MCH: 31.6 pg (ref 26.0–34.0)
MCHC: 36.2 g/dL — ABNORMAL HIGH (ref 30.0–36.0)
MCV: 87.2 fL (ref 78.0–100.0)
Monocytes Absolute: 0.6 10*3/uL (ref 0.1–1.0)
Monocytes Relative: 8 % (ref 3–12)
Neutro Abs: 3.7 10*3/uL (ref 1.7–7.7)
Neutrophils Relative %: 46 % (ref 43–77)
Platelets: 233 10*3/uL (ref 150–400)
RBC: 4.46 MIL/uL (ref 4.22–5.81)
RDW: 12.4 % (ref 11.5–15.5)
WBC: 8.1 10*3/uL (ref 4.0–10.5)

## 2013-08-07 LAB — COMPREHENSIVE METABOLIC PANEL
ALT: 30 U/L (ref 0–53)
AST: 30 U/L (ref 0–37)
Albumin: 3.9 g/dL (ref 3.5–5.2)
Alkaline Phosphatase: 65 U/L (ref 39–117)
BUN: 11 mg/dL (ref 6–23)
CO2: 22 mEq/L (ref 19–32)
Calcium: 9.7 mg/dL (ref 8.4–10.5)
Chloride: 99 mEq/L (ref 96–112)
Creatinine, Ser: 0.94 mg/dL (ref 0.50–1.35)
GFR calc Af Amer: >90 mL/min (ref >90)
GFR calc non Af Amer: >90 mL/min (ref >90)
Glucose, Bld: 92 mg/dL (ref 70–99)
Potassium: 3.8 mEq/L (ref 3.5–5.1)
Sodium: 133 mEq/L — ABNORMAL LOW (ref 135–145)
Total Bilirubin: 0.4 mg/dL (ref 0.3–1.2)
Total Protein: 7.4 g/dL (ref 6.0–8.3)

## 2013-08-07 LAB — LIPASE, BLOOD: Lipase: 31 U/L (ref 11–59)
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
## 2013-08-07 NOTE — ED Notes (Signed)
Patient not in the waiting room for vitals recheck
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
## 2013-08-08 ENCOUNTER — Telehealth: Payer: Self-pay | Admitting: Emergency Medicine

## 2013-08-08 NOTE — Telephone Encounter (Signed)
PT WANTING NARCOTIC REFILL.PT INFORMED OF NEW POLICY. APPT MADE FOR NEXT Wednesday

## 2013-08-14 ENCOUNTER — Ambulatory Visit: Payer: No Typology Code available for payment source | Attending: Internal Medicine | Admitting: Internal Medicine

## 2013-08-14 VITALS — BP 122/78 | HR 60 | Temp 98.2°F | Resp 16 | Ht 69.69 in | Wt 147.0 lb

## 2013-08-14 DIAGNOSIS — R1011 Right upper quadrant pain: Secondary | ICD-10-CM

## 2013-08-14 DIAGNOSIS — G8929 Other chronic pain: Secondary | ICD-10-CM | POA: Insufficient documentation

## 2013-08-14 DIAGNOSIS — R109 Unspecified abdominal pain: Secondary | ICD-10-CM | POA: Insufficient documentation

## 2013-08-14 MED ORDER — DICYCLOMINE HCL 20 MG PO TABS: 20.0000 mg | ORAL_TABLET | Freq: Four times a day (QID) | ORAL | Status: DC
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
## 2013-09-03 ENCOUNTER — Encounter (HOSPITAL_COMMUNITY): Payer: Self-pay

## 2013-09-03 ENCOUNTER — Emergency Department (HOSPITAL_COMMUNITY)
Admission: EM | Admit: 2013-09-03 | Discharge: 2013-09-03 | Disposition: A | Discharge status: Home / Self Care | Payer: No Typology Code available for payment source | Attending: Emergency Medicine | Admitting: Emergency Medicine

## 2013-09-03 DIAGNOSIS — Z87828 Personal history of other (healed) physical injury and trauma: Secondary | ICD-10-CM | POA: Insufficient documentation

## 2013-09-03 DIAGNOSIS — Z9089 Acquired absence of other organs: Secondary | ICD-10-CM | POA: Insufficient documentation

## 2013-09-03 DIAGNOSIS — Z872 Personal history of diseases of the skin and subcutaneous tissue: Secondary | ICD-10-CM | POA: Insufficient documentation

## 2013-09-03 DIAGNOSIS — G8929 Other chronic pain: Secondary | ICD-10-CM | POA: Insufficient documentation

## 2013-09-03 DIAGNOSIS — R1031 Right lower quadrant pain: Secondary | ICD-10-CM | POA: Insufficient documentation

## 2013-09-03 DIAGNOSIS — Z9889 Other specified postprocedural states: Secondary | ICD-10-CM | POA: Insufficient documentation

## 2013-09-03 DIAGNOSIS — Z8719 Personal history of other diseases of the digestive system: Secondary | ICD-10-CM | POA: Insufficient documentation

## 2013-09-03 DIAGNOSIS — R1084 Generalized abdominal pain: Secondary | ICD-10-CM | POA: Insufficient documentation

## 2013-09-03 DIAGNOSIS — F17200 Nicotine dependence, unspecified, uncomplicated: Secondary | ICD-10-CM | POA: Insufficient documentation

## 2013-09-03 LAB — CBC WITH DIFFERENTIAL/PLATELET
Basophils Absolute: 0.1 10*3/uL (ref 0.0–0.1)
Basophils Relative: 1 % (ref 0–1)
Eosinophils Absolute: 0.2 10*3/uL (ref 0.0–0.7)
Eosinophils Relative: 2 % (ref 0–5)
HCT: 42.2 % (ref 39.0–52.0)
Hemoglobin: 14.6 g/dL (ref 13.0–17.0)
Lymphocytes Relative: 37 % (ref 12–46)
Lymphs Abs: 2.8 10*3/uL (ref 0.7–4.0)
MCH: 31.6 pg (ref 26.0–34.0)
MCHC: 34.6 g/dL (ref 30.0–36.0)
MCV: 91.3 fL (ref 78.0–100.0)
Monocytes Absolute: 0.5 10*3/uL (ref 0.1–1.0)
Monocytes Relative: 7 % (ref 3–12)
Neutro Abs: 4 10*3/uL (ref 1.7–7.7)
Neutrophils Relative %: 53 % (ref 43–77)
Platelets: 184 10*3/uL (ref 150–400)
RBC: 4.62 MIL/uL (ref 4.22–5.81)
RDW: 13.7 % (ref 11.5–15.5)
WBC: 7.6 10*3/uL (ref 4.0–10.5)

## 2013-09-03 LAB — POCT I-STAT, CHEM 8
BUN: 10 mg/dL (ref 6–23)
Calcium, Ion: 1.23 mmol/L (ref 1.12–1.23)
Chloride: 104 mEq/L (ref 96–112)
Creatinine, Ser: 1 mg/dL (ref 0.50–1.35)
Glucose, Bld: 91 mg/dL (ref 70–99)
HCT: 47 % (ref 39.0–52.0)
Hemoglobin: 16 g/dL (ref 13.0–17.0)
Potassium: 4.4 mEq/L (ref 3.5–5.1)
Sodium: 140 mEq/L (ref 135–145)
TCO2: 26 mmol/L (ref 0–100)

## 2013-09-03 LAB — URINALYSIS, ROUTINE W REFLEX MICROSCOPIC
Bilirubin Urine: NEGATIVE
Glucose, UA: NEGATIVE mg/dL
Hgb urine dipstick: NEGATIVE
Ketones, ur: NEGATIVE mg/dL
Leukocytes, UA: NEGATIVE
Nitrite: NEGATIVE
Protein, ur: NEGATIVE mg/dL
Specific Gravity, Urine: 1.031 — ABNORMAL HIGH (ref 1.005–1.030)
Urobilinogen, UA: 0.2 mg/dL (ref 0.0–1.0)
pH: 5.5 (ref 5.0–8.0)

## 2013-09-03 LAB — CG4 I-STAT (LACTIC ACID): Lactic Acid, Venous: 1.01 mmol/L (ref 0.5–2.2)

## 2013-09-03 LAB — LIPASE, BLOOD: Lipase: 34 U/L (ref 11–59)

## 2013-09-03 MED ORDER — FENTANYL CITRATE 0.05 MG/ML IJ SOLN
50.0000 ug | Freq: Once | INTRAMUSCULAR | Status: AC
  Administered 2013-09-03: 50 ug via INTRAMUSCULAR
  Filled 2013-09-03: qty 2

## 2013-09-03 MED ORDER — SODIUM CHLORIDE 0.9 % IV BOLUS (SEPSIS): 1000.0000 mL | Freq: Once | INTRAVENOUS | Status: DC

## 2013-09-03 NOTE — ED Notes (Signed)
MD at bedside. 
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
## 2013-09-03 NOTE — Progress Notes (Signed)
[facility] CL spoke with patient about his [facility] [employer]. Patient confirmed his PCP is [facility]. Set patient up with a follow-up apt with PCP on 10/24.
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
## 2013-09-05 ENCOUNTER — Encounter (HOSPITAL_COMMUNITY): Payer: Self-pay | Admitting: Emergency Medicine

## 2013-09-05 ENCOUNTER — Emergency Department (HOSPITAL_COMMUNITY)
Admission: EM | Admit: 2013-09-05 | Discharge: 2013-09-05 | Disposition: A | Discharge status: Home / Self Care | Payer: No Typology Code available for payment source | Attending: Emergency Medicine | Admitting: Emergency Medicine

## 2013-09-05 DIAGNOSIS — F17200 Nicotine dependence, unspecified, uncomplicated: Secondary | ICD-10-CM | POA: Insufficient documentation

## 2013-09-05 DIAGNOSIS — Z8719 Personal history of other diseases of the digestive system: Secondary | ICD-10-CM | POA: Insufficient documentation

## 2013-09-05 DIAGNOSIS — R112 Nausea with vomiting, unspecified: Secondary | ICD-10-CM | POA: Insufficient documentation

## 2013-09-05 DIAGNOSIS — G8929 Other chronic pain: Secondary | ICD-10-CM | POA: Insufficient documentation

## 2013-09-05 DIAGNOSIS — M549 Dorsalgia, unspecified: Secondary | ICD-10-CM | POA: Insufficient documentation

## 2013-09-05 DIAGNOSIS — R1011 Right upper quadrant pain: Secondary | ICD-10-CM | POA: Insufficient documentation

## 2013-09-05 DIAGNOSIS — Z87828 Personal history of other (healed) physical injury and trauma: Secondary | ICD-10-CM | POA: Insufficient documentation

## 2013-09-05 DIAGNOSIS — Z872 Personal history of diseases of the skin and subcutaneous tissue: Secondary | ICD-10-CM | POA: Insufficient documentation

## 2013-09-05 LAB — COMPREHENSIVE METABOLIC PANEL WITH GFR
ALT: 29 U/L (ref 0–53)
AST: 25 U/L (ref 0–37)
Albumin: 4.3 g/dL (ref 3.5–5.2)
Alkaline Phosphatase: 63 U/L (ref 39–117)
BUN: 10 mg/dL (ref 6–23)
CO2: 25 meq/L (ref 19–32)
Calcium: 9.7 mg/dL (ref 8.4–10.5)
Chloride: 100 meq/L (ref 96–112)
Creatinine, Ser: 0.93 mg/dL (ref 0.50–1.35)
GFR calc Af Amer: >90 mL/min
GFR calc non Af Amer: >90 mL/min
Glucose, Bld: 107 mg/dL — ABNORMAL HIGH (ref 70–99)
Potassium: 3.7 meq/L (ref 3.5–5.1)
Sodium: 137 meq/L (ref 135–145)
Total Bilirubin: 0.5 mg/dL (ref 0.3–1.2)
Total Protein: 7.9 g/dL (ref 6.0–8.3)

## 2013-09-05 LAB — CBC
HCT: 42.5 % (ref 39.0–52.0)
Hemoglobin: 14.7 g/dL (ref 13.0–17.0)
MCH: 31 pg (ref 26.0–34.0)
MCHC: 34.6 g/dL (ref 30.0–36.0)
MCV: 89.7 fL (ref 78.0–100.0)
Platelets: 194 10*3/uL (ref 150–400)
RBC: 4.74 MIL/uL (ref 4.22–5.81)
RDW: 13.2 % (ref 11.5–15.5)
WBC: 8.5 10*3/uL (ref 4.0–10.5)

## 2013-09-05 LAB — LIPASE, BLOOD: Lipase: 45 U/L (ref 11–59)

## 2013-09-05 MED ORDER — FENTANYL CITRATE 0.05 MG/ML IJ SOLN
50.0000 ug | Freq: Once | INTRAMUSCULAR | Status: AC
  Administered 2013-09-05: 50 ug via INTRAMUSCULAR
  Filled 2013-09-05: qty 2

## 2013-09-05 MED ORDER — ONDANSETRON 4 MG PO TBDP
4.0000 mg | ORAL_TABLET | Freq: Once | ORAL | Status: AC
  Administered 2013-09-05: 4 mg via ORAL
  Filled 2013-09-05: qty 1

## 2013-09-05 NOTE — Progress Notes (Signed)
[facility] CL spoke with patient about his [facility] [employer] and follow-up apt.

## 2013-09-05 NOTE — ED Provider Notes (Signed)
Medical screening examination/treatment/procedure(s) were performed by non-physician practitioner and as supervising physician I was immediately available for consultation/collaboration.    Norvel Wenker J. Manon Banbury, MD 09/05/13 0708 
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
## 2013-09-05 NOTE — ED Notes (Signed)
Myself and traundra, Nt attempted blood draw and was unsuccesful.  Judeth Cornfield, phlebotomist made aware

## 2013-09-05 NOTE — ED Notes (Signed)
Pt c/o abd pain x1 day.  Reports he had an appt at [facility] for an MRI today and pain started afterwards.  Pt was seen here Wednesday for abd pain.

## 2013-09-07 ENCOUNTER — Encounter (HOSPITAL_COMMUNITY): Payer: Self-pay | Admitting: Emergency Medicine

## 2013-09-07 ENCOUNTER — Emergency Department (HOSPITAL_COMMUNITY)
Admission: EM | Admit: 2013-09-07 | Discharge: 2013-09-07 | Disposition: A | Discharge status: Home / Self Care | Payer: No Typology Code available for payment source | Attending: Emergency Medicine | Admitting: Emergency Medicine

## 2013-09-07 DIAGNOSIS — R1011 Right upper quadrant pain: Secondary | ICD-10-CM | POA: Insufficient documentation

## 2013-09-07 DIAGNOSIS — R112 Nausea with vomiting, unspecified: Secondary | ICD-10-CM | POA: Insufficient documentation

## 2013-09-07 DIAGNOSIS — Z872 Personal history of diseases of the skin and subcutaneous tissue: Secondary | ICD-10-CM | POA: Insufficient documentation

## 2013-09-07 DIAGNOSIS — F17200 Nicotine dependence, unspecified, uncomplicated: Secondary | ICD-10-CM | POA: Insufficient documentation

## 2013-09-07 DIAGNOSIS — G8929 Other chronic pain: Secondary | ICD-10-CM

## 2013-09-07 DIAGNOSIS — Z79899 Other long term (current) drug therapy: Secondary | ICD-10-CM | POA: Insufficient documentation

## 2013-09-07 DIAGNOSIS — Z8719 Personal history of other diseases of the digestive system: Secondary | ICD-10-CM | POA: Insufficient documentation

## 2013-09-07 LAB — CBC WITH DIFFERENTIAL/PLATELET
Basophils Absolute: 0 10*3/uL (ref 0.0–0.1)
Basophils Relative: 0 % (ref 0–1)
Eosinophils Absolute: 0 10*3/uL (ref 0.0–0.7)
Eosinophils Relative: 1 % (ref 0–5)
HCT: 37.1 % — ABNORMAL LOW (ref 39.0–52.0)
Hemoglobin: 12.9 g/dL — ABNORMAL LOW (ref 13.0–17.0)
Lymphocytes Relative: 27 % (ref 12–46)
Lymphs Abs: 2.1 10*3/uL (ref 0.7–4.0)
MCH: 31.2 pg (ref 26.0–34.0)
MCHC: 34.8 g/dL (ref 30.0–36.0)
MCV: 89.8 fL (ref 78.0–100.0)
Monocytes Absolute: 0.6 10*3/uL (ref 0.1–1.0)
Monocytes Relative: 8 % (ref 3–12)
Neutro Abs: 5 10*3/uL (ref 1.7–7.7)
Neutrophils Relative %: 64 % (ref 43–77)
Platelets: 186 10*3/uL (ref 150–400)
RBC: 4.13 MIL/uL — ABNORMAL LOW (ref 4.22–5.81)
RDW: 13.2 % (ref 11.5–15.5)
WBC: 7.7 10*3/uL (ref 4.0–10.5)

## 2013-09-07 LAB — COMPREHENSIVE METABOLIC PANEL
ALT: 28 U/L (ref 0–53)
AST: 25 U/L (ref 0–37)
Albumin: 4 g/dL (ref 3.5–5.2)
Alkaline Phosphatase: 57 U/L (ref 39–117)
BUN: 11 mg/dL (ref 6–23)
CO2: 25 mEq/L (ref 19–32)
Calcium: 9.6 mg/dL (ref 8.4–10.5)
Chloride: 102 mEq/L (ref 96–112)
Creatinine, Ser: 1.02 mg/dL (ref 0.50–1.35)
GFR calc Af Amer: >90 mL/min (ref >90)
GFR calc non Af Amer: 85 mL/min — ABNORMAL LOW (ref >90)
Glucose, Bld: 133 mg/dL — ABNORMAL HIGH (ref 70–99)
Potassium: 3.2 mEq/L — ABNORMAL LOW (ref 3.5–5.1)
Sodium: 139 mEq/L (ref 135–145)
Total Bilirubin: 0.5 mg/dL (ref 0.3–1.2)
Total Protein: 7 g/dL (ref 6.0–8.3)

## 2013-09-07 LAB — LIPASE, BLOOD: Lipase: 87 U/L — ABNORMAL HIGH (ref 11–59)

## 2013-09-07 LAB — OCCULT BLOOD, POC DEVICE: Fecal Occult Bld: NEGATIVE

## 2013-09-07 MED ORDER — PROMETHAZINE HCL 25 MG PO TABS: 25.0000 mg | ORAL_TABLET | Freq: Four times a day (QID) | ORAL | Status: DC | PRN

## 2013-09-07 MED ORDER — POTASSIUM CHLORIDE CRYS ER 20 MEQ PO TBCR
40.0000 meq | EXTENDED_RELEASE_TABLET | Freq: Once | ORAL | Status: AC
  Administered 2013-09-07: 40 meq via ORAL
  Filled 2013-09-07: qty 2

## 2013-09-07 MED ORDER — FENTANYL CITRATE 0.05 MG/ML IJ SOLN
100.0000 ug | Freq: Once | INTRAMUSCULAR | Status: AC
  Administered 2013-09-07: 100 ug via INTRAVENOUS
  Filled 2013-09-07: qty 2

## 2013-09-07 MED ORDER — ONDANSETRON HCL 4 MG/2ML IJ SOLN
4.0000 mg | Freq: Once | INTRAMUSCULAR | Status: AC
  Administered 2013-09-07: 4 mg via INTRAVENOUS
  Filled 2013-09-07: qty 2
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
## 2013-09-07 NOTE — ED Notes (Signed)
Bed: ZO10 Expected date: 09/07/13 Expected time: 12:27 AM Means of arrival: Ambulance Comments: abd pain

## 2013-09-08 NOTE — ED Provider Notes (Signed)
Medical screening examination/treatment/procedure(s) were performed by non-physician practitioner and as supervising physician I was immediately available for consultation/collaboration.  Enid Skeens, MD 09/08/13 [phone]

## 2013-09-09 ENCOUNTER — Emergency Department (HOSPITAL_COMMUNITY)
Admission: EM | Admit: 2013-09-09 | Discharge: 2013-09-09 | Disposition: A | Discharge status: Home / Self Care | Payer: No Typology Code available for payment source | Attending: Emergency Medicine | Admitting: Emergency Medicine

## 2013-09-09 ENCOUNTER — Encounter (HOSPITAL_COMMUNITY): Payer: Self-pay | Admitting: *Deleted

## 2013-09-09 DIAGNOSIS — Z87828 Personal history of other (healed) physical injury and trauma: Secondary | ICD-10-CM | POA: Insufficient documentation

## 2013-09-09 DIAGNOSIS — F17200 Nicotine dependence, unspecified, uncomplicated: Secondary | ICD-10-CM | POA: Insufficient documentation

## 2013-09-09 DIAGNOSIS — Z79899 Other long term (current) drug therapy: Secondary | ICD-10-CM | POA: Insufficient documentation

## 2013-09-09 DIAGNOSIS — G8929 Other chronic pain: Secondary | ICD-10-CM | POA: Insufficient documentation

## 2013-09-09 DIAGNOSIS — Z8719 Personal history of other diseases of the digestive system: Secondary | ICD-10-CM | POA: Insufficient documentation

## 2013-09-09 DIAGNOSIS — R110 Nausea: Secondary | ICD-10-CM | POA: Insufficient documentation

## 2013-09-09 DIAGNOSIS — Z9089 Acquired absence of other organs: Secondary | ICD-10-CM | POA: Insufficient documentation

## 2013-09-09 DIAGNOSIS — R109 Unspecified abdominal pain: Secondary | ICD-10-CM | POA: Insufficient documentation

## 2013-09-09 DIAGNOSIS — F1210 Cannabis abuse, uncomplicated: Secondary | ICD-10-CM | POA: Insufficient documentation

## 2013-09-09 LAB — BASIC METABOLIC PANEL
BUN: 10 mg/dL (ref 6–23)
CO2: 26 mEq/L (ref 19–32)
Calcium: 9.4 mg/dL (ref 8.4–10.5)
Chloride: 104 mEq/L (ref 96–112)
Creatinine, Ser: 0.99 mg/dL (ref 0.50–1.35)
GFR calc Af Amer: >90 mL/min (ref >90)
GFR calc non Af Amer: >90 mL/min (ref >90)
Glucose, Bld: 106 mg/dL — ABNORMAL HIGH (ref 70–99)
Potassium: 3.9 mEq/L (ref 3.5–5.1)
Sodium: 140 mEq/L (ref 135–145)

## 2013-09-09 LAB — CBC
HCT: 37.4 % — ABNORMAL LOW (ref 39.0–52.0)
Hemoglobin: 13.4 g/dL (ref 13.0–17.0)
MCH: 32.2 pg (ref 26.0–34.0)
MCHC: 35.8 g/dL (ref 30.0–36.0)
MCV: 89.9 fL (ref 78.0–100.0)
Platelets: 188 10*3/uL (ref 150–400)
RBC: 4.16 MIL/uL — ABNORMAL LOW (ref 4.22–5.81)
RDW: 13.2 % (ref 11.5–15.5)
WBC: 11.2 10*3/uL — ABNORMAL HIGH (ref 4.0–10.5)

## 2013-09-09 LAB — URINALYSIS, ROUTINE W REFLEX MICROSCOPIC
Bilirubin Urine: NEGATIVE
Glucose, UA: NEGATIVE mg/dL
Hgb urine dipstick: NEGATIVE
Ketones, ur: 15 mg/dL — AB
Leukocytes, UA: NEGATIVE
Nitrite: NEGATIVE
Protein, ur: NEGATIVE mg/dL
Specific Gravity, Urine: 1.027 (ref 1.005–1.030)
Urobilinogen, UA: 0.2 mg/dL (ref 0.0–1.0)
pH: 6.5 (ref 5.0–8.0)

## 2013-09-09 MED ORDER — HYDROMORPHONE HCL PF 1 MG/ML IJ SOLN
1.0000 mg | Freq: Once | INTRAMUSCULAR | Status: AC
  Administered 2013-09-09: 1 mg via INTRAVENOUS
  Filled 2013-09-09: qty 1

## 2013-09-09 MED ORDER — DICYCLOMINE HCL 10 MG PO CAPS
10.0000 mg | ORAL_CAPSULE | Freq: Once | ORAL | Status: AC
  Administered 2013-09-09: 10 mg via ORAL
  Filled 2013-09-09: qty 1
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
## 2013-09-17 ENCOUNTER — Emergency Department (HOSPITAL_COMMUNITY)
Admission: EM | Admit: 2013-09-17 | Discharge: 2013-09-17 | Disposition: A | Discharge status: Home / Self Care | Payer: No Typology Code available for payment source | Attending: Emergency Medicine | Admitting: Emergency Medicine

## 2013-09-17 ENCOUNTER — Encounter (HOSPITAL_COMMUNITY): Payer: Self-pay | Admitting: Emergency Medicine

## 2013-09-17 DIAGNOSIS — Z8719 Personal history of other diseases of the digestive system: Secondary | ICD-10-CM | POA: Insufficient documentation

## 2013-09-17 DIAGNOSIS — Z872 Personal history of diseases of the skin and subcutaneous tissue: Secondary | ICD-10-CM | POA: Insufficient documentation

## 2013-09-17 DIAGNOSIS — R109 Unspecified abdominal pain: Secondary | ICD-10-CM | POA: Insufficient documentation

## 2013-09-17 DIAGNOSIS — G8929 Other chronic pain: Secondary | ICD-10-CM | POA: Insufficient documentation

## 2013-09-17 DIAGNOSIS — R112 Nausea with vomiting, unspecified: Secondary | ICD-10-CM | POA: Insufficient documentation

## 2013-09-17 DIAGNOSIS — F17200 Nicotine dependence, unspecified, uncomplicated: Secondary | ICD-10-CM | POA: Insufficient documentation

## 2013-09-17 DIAGNOSIS — R110 Nausea: Secondary | ICD-10-CM

## 2013-09-17 LAB — COMPREHENSIVE METABOLIC PANEL
ALT: 32 U/L (ref 0–53)
AST: 21 U/L (ref 0–37)
Albumin: 3.7 g/dL (ref 3.5–5.2)
Alkaline Phosphatase: 56 U/L (ref 39–117)
BUN: 10 mg/dL (ref 6–23)
CO2: 22 mEq/L (ref 19–32)
Calcium: 9.2 mg/dL (ref 8.4–10.5)
Chloride: 105 mEq/L (ref 96–112)
Creatinine, Ser: 0.87 mg/dL (ref 0.50–1.35)
GFR calc Af Amer: >90 mL/min (ref >90)
GFR calc non Af Amer: >90 mL/min (ref >90)
Glucose, Bld: 86 mg/dL (ref 70–99)
Potassium: 4.2 mEq/L (ref 3.5–5.1)
Sodium: 138 mEq/L (ref 135–145)
Total Bilirubin: 0.5 mg/dL (ref 0.3–1.2)
Total Protein: 6.9 g/dL (ref 6.0–8.3)

## 2013-09-17 LAB — URINALYSIS, ROUTINE W REFLEX MICROSCOPIC
Bilirubin Urine: NEGATIVE
Glucose, UA: NEGATIVE mg/dL
Hgb urine dipstick: NEGATIVE
Ketones, ur: 15 mg/dL — AB
Leukocytes, UA: NEGATIVE
Nitrite: NEGATIVE
Protein, ur: NEGATIVE mg/dL
Specific Gravity, Urine: 1.03 (ref 1.005–1.030)
Urobilinogen, UA: 0.2 mg/dL (ref 0.0–1.0)
pH: 5 (ref 5.0–8.0)

## 2013-09-17 LAB — CBC WITH DIFFERENTIAL/PLATELET
Basophils Absolute: 0 10*3/uL (ref 0.0–0.1)
Basophils Relative: 0 % (ref 0–1)
Eosinophils Absolute: 0.1 10*3/uL (ref 0.0–0.7)
Eosinophils Relative: 1 % (ref 0–5)
HCT: 42.1 % (ref 39.0–52.0)
Hemoglobin: 14.4 g/dL (ref 13.0–17.0)
Lymphocytes Relative: 36 % (ref 12–46)
Lymphs Abs: 2.9 10*3/uL (ref 0.7–4.0)
MCH: 31.4 pg (ref 26.0–34.0)
MCHC: 34.2 g/dL (ref 30.0–36.0)
MCV: 91.7 fL (ref 78.0–100.0)
Monocytes Absolute: 0.6 10*3/uL (ref 0.1–1.0)
Monocytes Relative: 7 % (ref 3–12)
Neutro Abs: 4.6 10*3/uL (ref 1.7–7.7)
Neutrophils Relative %: 57 % (ref 43–77)
Platelets: 211 10*3/uL (ref 150–400)
RBC: 4.59 MIL/uL (ref 4.22–5.81)
RDW: 14.6 % (ref 11.5–15.5)
WBC: 8.2 10*3/uL (ref 4.0–10.5)

## 2013-09-17 LAB — LIPASE, BLOOD: Lipase: 25 U/L (ref 11–59)

## 2013-09-17 LAB — RAPID URINE DRUG SCREEN, HOSP PERFORMED
Amphetamines: NOT DETECTED
Barbiturates: NOT DETECTED
Benzodiazepines: NOT DETECTED
Cocaine: NOT DETECTED
Opiates: NOT DETECTED
Tetrahydrocannabinol: POSITIVE — AB

## 2013-09-17 MED ORDER — KETOROLAC TROMETHAMINE 30 MG/ML IJ SOLN
30.0000 mg | Freq: Once | INTRAMUSCULAR | Status: DC
  Filled 2013-09-17: qty 1

## 2013-09-17 MED ORDER — SODIUM CHLORIDE 0.9 % IV BOLUS (SEPSIS)
1000.0000 mL | Freq: Once | INTRAVENOUS | Status: AC
  Administered 2013-09-17: 1000 mL via INTRAVENOUS

## 2013-09-17 MED ORDER — PROMETHAZINE HCL 25 MG/ML IJ SOLN
25.0000 mg | Freq: Once | INTRAMUSCULAR | Status: DC
  Filled 2013-09-17: qty 1

## 2013-09-17 MED ORDER — ONDANSETRON HCL 4 MG/2ML IJ SOLN
4.0000 mg | Freq: Once | INTRAMUSCULAR | Status: AC
  Administered 2013-09-17: 4 mg via INTRAVENOUS
  Filled 2013-09-17: qty 2

## 2013-09-17 MED ORDER — SODIUM CHLORIDE 0.9 % IV SOLN
INTRAVENOUS | Status: DC
  Administered 2013-09-17: 21:00:00 via INTRAVENOUS

## 2013-09-17 MED ORDER — IBUPROFEN 800 MG PO TABS
800.0000 mg | ORAL_TABLET | Freq: Once | ORAL | Status: AC
  Administered 2013-09-17: 800 mg via ORAL
  Filled 2013-09-17: qty 1

## 2013-09-17 MED ORDER — ONDANSETRON HCL 8 MG PO TABS: 8.0000 mg | ORAL_TABLET | Freq: Three times a day (TID) | ORAL | Status: DC | PRN

## 2013-09-17 NOTE — Discharge Instructions (Signed)
B.R.A.T. Diet Your doctor has recommended the B.R.A.T. diet for you or your child until the condition improves. This is often used to help control diarrhea and vomiting symptoms. If you or your child can tolerate clear liquids, you may have:  Bananas.   Rice.   Applesauce.   Toast (and other simple starches such as crackers, potatoes, noodles).  Be sure to avoid dairy products, meats, and fatty foods until symptoms are better. Fruit juices such as apple, grape, and prune juice can make diarrhea worse. Avoid these. Continue this diet for 2 days or as instructed by your caregiver. Document Released: 11/20/2005 Document Revised: 11/09/2011 Document Reviewed: 05/09/2007 [facility] Patient Information 2012 [place], [place]. Chronic Pain Management Managing chronic pain is not easy. The goal is to provide as much pain relief as possible. There are emotional as well as physical problems. Chronic pain may lead to symptoms of depression which magnify those of the pain. Problems may include:  Anxiety.  Sleep disturbances.  Confused thinking.  Feeling cranky.  Fatigue.  Weight gain or loss. Identify the source of the pain first, if possible. The pain may be masking another problem. Try to find a pain management specialist or clinic. Work with a team to create a treatment plan for you. MEDICATIONS  May include narcotics or opioids. Larger than normal doses may be needed to control your pain.  Drugs for depression may help.  Over-the-counter medicines may help for some conditions. These drugs may be used along with others for better pain relief.  May be injected into sites such as the spine and joints. Injections may have to be repeated if they wear off. THERAPY MAY INCLUDE:  Working with a physical therapist to keep from getting stiff.  Regular, gentle exercise.  Cognitive or behavioral therapy.  Using complementary or integrative medicine such as:  Acupuncture.  Massage, Reiki, or  Rolfing.  Aroma, color, light, or sound therapy.  Group support. FOR MORE INFORMATION [email or web address]. American Chronic Pain Association [email or web address].[email or web address]. Document Released: 12/28/2004 Document Revised: 02/12/2012 Document Reviewed: 02/06/2008 [facility] Patient Information 2014 [place], [place].

## 2013-09-17 NOTE — ED Notes (Signed)
IV team at bedside 
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
## 2013-09-17 NOTE — ED Notes (Signed)
IV starts unsuccessful. IV Team Paged

## 2013-09-17 NOTE — ED Notes (Signed)
Patient allergic with gi upset with ketorolac and promethazine EDP notified.  Patient requested Zofran.

## 2013-09-17 NOTE — ED Notes (Signed)
PT cursing very loud enough to be heard in hall with door closed. Gently reminded PT to not use profanity in Emergency Department

## 2013-09-17 NOTE — ED Notes (Signed)
Pt arrived by [facility] for abd pain and n/v. Has hx of chronic abd pain. Was here on 10/7 for same and has appt tomorrow with pcp. No acute distress noted on arrival.

## 2013-09-26 ENCOUNTER — Encounter: Payer: Self-pay | Admitting: Internal Medicine

## 2013-09-26 ENCOUNTER — Ambulatory Visit: Payer: No Typology Code available for payment source | Attending: Internal Medicine | Admitting: Internal Medicine

## 2013-09-26 VITALS — BP 108/61 | HR 61 | Temp 99.0°F | Resp 18 | Wt 143.0 lb

## 2013-09-26 DIAGNOSIS — R1011 Right upper quadrant pain: Secondary | ICD-10-CM | POA: Insufficient documentation

## 2013-09-26 DIAGNOSIS — R109 Unspecified abdominal pain: Secondary | ICD-10-CM

## 2013-09-26 DIAGNOSIS — K3189 Other diseases of stomach and duodenum: Secondary | ICD-10-CM
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
## 2013-09-26 MED ORDER — OXYCODONE-ACETAMINOPHEN 5-325 MG PO TABS: 1.0000 | ORAL_TABLET | Freq: Three times a day (TID) | ORAL | Status: DC | PRN

## 2013-09-26 NOTE — Patient Instructions (Signed)
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
## 2013-09-26 NOTE — Progress Notes (Signed)
Pt is here for a hosp f/u for abd pain Taking bentyl w/no relief Reports sxs are still the same C/o vomiting and diarrhea on a daily basis He is alert w/no signs of acute distress.

## 2013-09-27 ENCOUNTER — Emergency Department (HOSPITAL_COMMUNITY)
Admission: EM | Admit: 2013-09-27 | Discharge: 2013-09-27 | Disposition: A | Discharge status: Home / Self Care | Payer: No Typology Code available for payment source | Attending: Emergency Medicine | Admitting: Emergency Medicine

## 2013-09-27 ENCOUNTER — Encounter (HOSPITAL_COMMUNITY): Payer: Self-pay | Admitting: Emergency Medicine

## 2013-09-27 DIAGNOSIS — Z8719 Personal history of other diseases of the digestive system: Secondary | ICD-10-CM | POA: Insufficient documentation

## 2013-09-27 DIAGNOSIS — Z9089 Acquired absence of other organs: Secondary | ICD-10-CM | POA: Insufficient documentation

## 2013-09-27 DIAGNOSIS — G8929 Other chronic pain: Secondary | ICD-10-CM | POA: Insufficient documentation

## 2013-09-27 DIAGNOSIS — Z8739 Personal history of other diseases of the musculoskeletal system and connective tissue: Secondary | ICD-10-CM | POA: Insufficient documentation

## 2013-09-27 DIAGNOSIS — F17200 Nicotine dependence, unspecified, uncomplicated: Secondary | ICD-10-CM | POA: Insufficient documentation

## 2013-09-27 DIAGNOSIS — R112 Nausea with vomiting, unspecified: Secondary | ICD-10-CM | POA: Insufficient documentation

## 2013-09-27 DIAGNOSIS — Z87828 Personal history of other (healed) physical injury and trauma: Secondary | ICD-10-CM | POA: Insufficient documentation

## 2013-09-27 DIAGNOSIS — Z9889 Other specified postprocedural states: Secondary | ICD-10-CM | POA: Insufficient documentation

## 2013-09-27 DIAGNOSIS — R197 Diarrhea, unspecified: Secondary | ICD-10-CM | POA: Insufficient documentation

## 2013-09-27 DIAGNOSIS — R1033 Periumbilical pain: Secondary | ICD-10-CM | POA: Insufficient documentation

## 2013-09-27 DIAGNOSIS — Z872 Personal history of diseases of the skin and subcutaneous tissue: Secondary | ICD-10-CM | POA: Insufficient documentation

## 2013-09-27 LAB — CBC WITH DIFFERENTIAL/PLATELET
Basophils Absolute: 0 10*3/uL (ref 0.0–0.1)
Basophils Relative: 0 % (ref 0–1)
Eosinophils Absolute: 0 10*3/uL (ref 0.0–0.7)
Eosinophils Relative: 1 % (ref 0–5)
HCT: 38 % — ABNORMAL LOW (ref 39.0–52.0)
Hemoglobin: 13.2 g/dL (ref 13.0–17.0)
Lymphocytes Relative: 35 % (ref 12–46)
Lymphs Abs: 2.9 10*3/uL (ref 0.7–4.0)
MCH: 31.4 pg (ref 26.0–34.0)
MCHC: 34.7 g/dL (ref 30.0–36.0)
MCV: 90.3 fL (ref 78.0–100.0)
Monocytes Absolute: 0.6 10*3/uL (ref 0.1–1.0)
Monocytes Relative: 7 % (ref 3–12)
Neutro Abs: 4.8 10*3/uL (ref 1.7–7.7)
Neutrophils Relative %: 58 % (ref 43–77)
Platelets: 213 10*3/uL (ref 150–400)
RBC: 4.21 MIL/uL — ABNORMAL LOW (ref 4.22–5.81)
RDW: 13.4 % (ref 11.5–15.5)
WBC: 8.3 10*3/uL (ref 4.0–10.5)

## 2013-09-27 LAB — URINALYSIS, ROUTINE W REFLEX MICROSCOPIC
Bilirubin Urine: NEGATIVE
Glucose, UA: NEGATIVE mg/dL
Hgb urine dipstick: NEGATIVE
Ketones, ur: NEGATIVE mg/dL
Nitrite: NEGATIVE
Protein, ur: 30 mg/dL — AB
Specific Gravity, Urine: 1.037 — ABNORMAL HIGH (ref 1.005–1.030)
Urobilinogen, UA: 1 mg/dL (ref 0.0–1.0)
pH: 6 (ref 5.0–8.0)

## 2013-09-27 LAB — URINE MICROSCOPIC-ADD ON

## 2013-09-27 LAB — BASIC METABOLIC PANEL
BUN: 9 mg/dL (ref 6–23)
CO2: 24 mEq/L (ref 19–32)
Calcium: 8.5 mg/dL (ref 8.4–10.5)
Chloride: 100 mEq/L (ref 96–112)
Creatinine, Ser: 0.94 mg/dL (ref 0.50–1.35)
GFR calc Af Amer: >90 mL/min (ref >90)
GFR calc non Af Amer: >90 mL/min (ref >90)
Glucose, Bld: 113 mg/dL — ABNORMAL HIGH (ref 70–99)
Potassium: 3.6 mEq/L (ref 3.5–5.1)
Sodium: 132 mEq/L — ABNORMAL LOW (ref 135–145)

## 2013-09-27 MED ORDER — SULFAMETHOXAZOLE-TRIMETHOPRIM 800-160 MG PO TABS: 1.0000 | ORAL_TABLET | Freq: Two times a day (BID) | ORAL | Status: DC

## 2013-09-27 MED ORDER — ONDANSETRON 4 MG PO TBDP
4.0000 mg | ORAL_TABLET | Freq: Once | ORAL | Status: AC
  Administered 2013-09-27: 4 mg via ORAL
  Filled 2013-09-27: qty 1

## 2013-09-27 MED ORDER — OXYCODONE-ACETAMINOPHEN 5-325 MG PO TABS
1.0000 | ORAL_TABLET | Freq: Once | ORAL | Status: AC
  Administered 2013-09-27: 1 via ORAL
  Filled 2013-09-27: qty 1

## 2013-09-27 MED ORDER — SODIUM CHLORIDE 0.9 % IV BOLUS (SEPSIS)
1000.0000 mL | Freq: Once | INTRAVENOUS | Status: AC
  Administered 2013-09-27: 1000 mL via INTRAVENOUS

## 2013-09-27 MED ORDER — KETOROLAC TROMETHAMINE 30 MG/ML IJ SOLN
30.0000 mg | Freq: Once | INTRAMUSCULAR | Status: DC
Start: 2013-09-27 — End: 2013-09-28
  Filled 2013-09-27: qty 1

## 2013-09-27 NOTE — ED Provider Notes (Signed)
Medical screening examination/treatment/procedure(s) were performed by non-physician practitioner and as supervising physician I was immediately available for consultation/collaboration.  EKG Interpretation   None        Toy Baker, MD 09/27/13 2238
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
## 2013-09-27 NOTE — ED Notes (Signed)
Phlebotomy unable to draw labs. x2 IV insertion by Chad, S. x1 IV insertion by this nurse. IV team paged.
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
## 2013-09-27 NOTE — ED Notes (Signed)
Per pt was seen yesterday in doctor's office for same symptoms of abd pain with n/v. Pt reports getting prescriptions for pain and nausea but have not picked them up.

## 2013-09-27 NOTE — ED Notes (Signed)
IV contacted. States they are on their way.

## 2013-09-27 NOTE — ED Notes (Addendum)
IV team inserted IV unable to draw labs from site. EDP and PA made aware.

## 2013-09-27 NOTE — ED Notes (Signed)
IV team at bedside 

## 2013-09-29 LAB — URINE CULTURE
Colony Count: NO GROWTH
Culture: NO GROWTH

## 2013-10-10 ENCOUNTER — Telehealth: Payer: Self-pay | Admitting: Emergency Medicine

## 2013-10-10 NOTE — Telephone Encounter (Signed)
Pt called requesting pain medication per Dr. Elisabeth Pigeon. Pt was prescribed Oxycodone #45 until seen by pain clinic. Pt was refused by [facility].

## 2013-10-11 ENCOUNTER — Emergency Department (HOSPITAL_COMMUNITY)
Admission: EM | Admit: 2013-10-11 | Discharge: 2013-10-11 | Disposition: A | Discharge status: Home / Self Care | Payer: No Typology Code available for payment source | Attending: Emergency Medicine | Admitting: Emergency Medicine

## 2013-10-11 ENCOUNTER — Encounter (HOSPITAL_COMMUNITY): Payer: Self-pay | Admitting: Emergency Medicine

## 2013-10-11 DIAGNOSIS — Z872 Personal history of diseases of the skin and subcutaneous tissue: Secondary | ICD-10-CM | POA: Insufficient documentation

## 2013-10-11 DIAGNOSIS — M549 Dorsalgia, unspecified: Secondary | ICD-10-CM | POA: Insufficient documentation

## 2013-10-11 DIAGNOSIS — R0602 Shortness of breath: Secondary | ICD-10-CM | POA: Insufficient documentation

## 2013-10-11 DIAGNOSIS — Z87828 Personal history of other (healed) physical injury and trauma: Secondary | ICD-10-CM | POA: Insufficient documentation

## 2013-10-11 DIAGNOSIS — F17200 Nicotine dependence, unspecified, uncomplicated: Secondary | ICD-10-CM | POA: Insufficient documentation

## 2013-10-11 DIAGNOSIS — R112 Nausea with vomiting, unspecified: Secondary | ICD-10-CM | POA: Insufficient documentation

## 2013-10-11 DIAGNOSIS — R1011 Right upper quadrant pain: Secondary | ICD-10-CM | POA: Insufficient documentation

## 2013-10-11 DIAGNOSIS — R197 Diarrhea, unspecified: Secondary | ICD-10-CM | POA: Insufficient documentation

## 2013-10-11 DIAGNOSIS — Z8719 Personal history of other diseases of the digestive system: Secondary | ICD-10-CM | POA: Insufficient documentation

## 2013-10-11 DIAGNOSIS — Z792 Long term (current) use of antibiotics: Secondary | ICD-10-CM | POA: Insufficient documentation

## 2013-10-11 LAB — CBC WITH DIFFERENTIAL/PLATELET
Basophils Absolute: 0 10*3/uL (ref 0.0–0.1)
Basophils Relative: 0 % (ref 0–1)
Eosinophils Absolute: 0.1 10*3/uL (ref 0.0–0.7)
Eosinophils Relative: 1 % (ref 0–5)
HCT: 43.1 % (ref 39.0–52.0)
Hemoglobin: 15.4 g/dL (ref 13.0–17.0)
Lymphocytes Relative: 15 % (ref 12–46)
Lymphs Abs: 1.9 10*3/uL (ref 0.7–4.0)
MCH: 32.2 pg (ref 26.0–34.0)
MCHC: 35.7 g/dL (ref 30.0–36.0)
MCV: 90.2 fL (ref 78.0–100.0)
Monocytes Absolute: 0.8 10*3/uL (ref 0.1–1.0)
Monocytes Relative: 6 % (ref 3–12)
Neutro Abs: 9.8 10*3/uL — ABNORMAL HIGH (ref 1.7–7.7)
Neutrophils Relative %: 78 % — ABNORMAL HIGH (ref 43–77)
Platelets: 213 10*3/uL (ref 150–400)
RBC: 4.78 MIL/uL (ref 4.22–5.81)
RDW: 13.1 % (ref 11.5–15.5)
WBC: 12.6 10*3/uL — ABNORMAL HIGH (ref 4.0–10.5)

## 2013-10-11 LAB — COMPREHENSIVE METABOLIC PANEL
ALT: 38 U/L (ref 0–53)
AST: 27 U/L (ref 0–37)
Albumin: 4.2 g/dL (ref 3.5–5.2)
Alkaline Phosphatase: 64 U/L (ref 39–117)
BUN: 9 mg/dL (ref 6–23)
CO2: 21 mEq/L (ref 19–32)
Calcium: 9.6 mg/dL (ref 8.4–10.5)
Chloride: 100 mEq/L (ref 96–112)
Creatinine, Ser: 0.95 mg/dL (ref 0.50–1.35)
GFR calc Af Amer: >90 mL/min (ref >90)
GFR calc non Af Amer: >90 mL/min (ref >90)
Glucose, Bld: 90 mg/dL (ref 70–99)
Potassium: 4.1 mEq/L (ref 3.5–5.1)
Sodium: 135 mEq/L (ref 135–145)
Total Bilirubin: 0.4 mg/dL (ref 0.3–1.2)
Total Protein: 7.8 g/dL (ref 6.0–8.3)

## 2013-10-11 LAB — LIPASE, BLOOD: Lipase: 41 U/L (ref 11–59)

## 2013-10-11 LAB — OCCULT BLOOD, POC DEVICE: Fecal Occult Bld: NEGATIVE

## 2013-10-11 MED ORDER — DICYCLOMINE HCL 10 MG PO CAPS
10.0000 mg | ORAL_CAPSULE | Freq: Once | ORAL | Status: AC
  Administered 2013-10-11: 10 mg via ORAL
  Filled 2013-10-11: qty 1

## 2013-10-11 MED ORDER — ONDANSETRON 4 MG PO TBDP
4.0000 mg | ORAL_TABLET | Freq: Once | ORAL | Status: DC
  Filled 2013-10-11: qty 1

## 2013-10-11 MED ORDER — GI COCKTAIL ~~LOC~~
30.0000 mL | Freq: Once | ORAL | Status: AC
  Administered 2013-10-11: 30 mL via ORAL
  Filled 2013-10-11: qty 30

## 2013-10-11 MED ORDER — SODIUM CHLORIDE 0.9 % IV BOLUS (SEPSIS)
1000.0000 mL | Freq: Once | INTRAVENOUS | Status: AC
  Administered 2013-10-11: 1000 mL via INTRAVENOUS

## 2013-10-11 MED ORDER — ONDANSETRON HCL 4 MG/2ML IJ SOLN
4.0000 mg | Freq: Once | INTRAMUSCULAR | Status: AC
  Administered 2013-10-11: 4 mg via INTRAVENOUS
  Filled 2013-10-11: qty 2

## 2013-10-11 NOTE — ED Provider Notes (Signed)
I saw and evaluated the patient, reviewed the resident's note and I agree with the findings and plan.  EKG Interpretation   None      Patient here with abdominal pain similar to his prior chronic pain. He is out of his pain meds and has taken over-the-counter medications without relief. On exam his abdomen is nonsurgical at this time. No guarding or rebound noted. No peritoneal signs. The plan will be to check basic labs because the patient notes possible coffee-ground emesis and avoid narcotics unless there are objective findings  Toy Baker, MD 10/11/13 [phone]
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
## 2013-10-11 NOTE — ED Notes (Addendum)
Pt with hx of chronic abdominal pain to ED stating he ran out of pain meds last night and has been trying to take otc pain meds without relief.  Pain to RUQ under scar, radiates to umbilicus and back. Pt states he was dx with a bladder infection on 10/24, but was unable to have prescription filled.

## 2013-10-11 NOTE — Progress Notes (Signed)
Case Manager met patient at bedside. Role of Case Manger RN explained,patient verbalizes understanding.Patient has had 25 ED visits in six months.Patient reports increased abdominal pain as reason for today's ED visit. Patient is registered at the [facility] and has the [employer] discount card.Patient gets his prescriptions using the [employer] four dollar plan.Patient education provided on the [employer]  [employer] savings prescription program,and resources for the [place] [address] provided.Patient reports he will follow up with his PCP next week and get assistance with a Gastro Referral.

## 2013-10-12 NOTE — ED Provider Notes (Signed)
I saw and evaluated the patient, reviewed the resident's note and I agree with the findings and plan.  EKG Interpretation   None        Toy Baker, MD 10/12/13 1444
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
## 2013-10-15 ENCOUNTER — Encounter (HOSPITAL_COMMUNITY): Payer: Self-pay | Admitting: Emergency Medicine

## 2013-10-15 ENCOUNTER — Emergency Department (INDEPENDENT_AMBULATORY_CARE_PROVIDER_SITE_OTHER)
Admission: EM | Admit: 2013-10-15 | Discharge: 2013-10-15 | Disposition: A | Discharge status: Home / Self Care | Payer: No Typology Code available for payment source | Source: Home / Self Care | Attending: Emergency Medicine | Admitting: Emergency Medicine

## 2013-10-15 DIAGNOSIS — R109 Unspecified abdominal pain: Secondary | ICD-10-CM

## 2013-10-15 DIAGNOSIS — G8929 Other chronic pain: Secondary | ICD-10-CM

## 2013-10-15 LAB — POCT URINALYSIS DIP (DEVICE)
Glucose, UA: NEGATIVE mg/dL
Leukocytes, UA: NEGATIVE
Nitrite: NEGATIVE
Protein, ur: 30 mg/dL — AB
Specific Gravity, Urine: >=1.03 (ref 1.005–1.030)
Urobilinogen, UA: 0.2 mg/dL (ref 0.0–1.0)
pH: 6 (ref 5.0–8.0)

## 2013-10-15 LAB — COMPREHENSIVE METABOLIC PANEL
ALT: 25 U/L (ref 0–53)
AST: 24 U/L (ref 0–37)
Albumin: 4.1 g/dL (ref 3.5–5.2)
Alkaline Phosphatase: 69 U/L (ref 39–117)
BUN: 10 mg/dL (ref 6–23)
CO2: 24 mEq/L (ref 19–32)
Calcium: 10 mg/dL (ref 8.4–10.5)
Chloride: 102 mEq/L (ref 96–112)
Creatinine, Ser: 0.89 mg/dL (ref 0.50–1.35)
GFR calc Af Amer: >90 mL/min (ref >90)
GFR calc non Af Amer: >90 mL/min (ref >90)
Glucose, Bld: 88 mg/dL (ref 70–99)
Potassium: 4.6 mEq/L (ref 3.5–5.1)
Sodium: 138 mEq/L (ref 135–145)
Total Bilirubin: 0.4 mg/dL (ref 0.3–1.2)
Total Protein: 8 g/dL (ref 6.0–8.3)

## 2013-10-15 LAB — CBC WITH DIFFERENTIAL/PLATELET
Basophils Absolute: 0 10*3/uL (ref 0.0–0.1)
Basophils Relative: 0 % (ref 0–1)
Eosinophils Absolute: 0.1 10*3/uL (ref 0.0–0.7)
Eosinophils Relative: 1 % (ref 0–5)
HCT: 41.6 % (ref 39.0–52.0)
Hemoglobin: 14.4 g/dL (ref 13.0–17.0)
Lymphocytes Relative: 30 % (ref 12–46)
Lymphs Abs: 3.1 10*3/uL (ref 0.7–4.0)
MCH: 31.3 pg (ref 26.0–34.0)
MCHC: 34.6 g/dL (ref 30.0–36.0)
MCV: 90.4 fL (ref 78.0–100.0)
Monocytes Absolute: 0.8 10*3/uL (ref 0.1–1.0)
Monocytes Relative: 7 % (ref 3–12)
Neutro Abs: 6.3 10*3/uL (ref 1.7–7.7)
Neutrophils Relative %: 61 % (ref 43–77)
Platelets: 195 10*3/uL (ref 150–400)
RBC: 4.6 MIL/uL (ref 4.22–5.81)
RDW: 13.2 % (ref 11.5–15.5)
WBC: 10.3 10*3/uL (ref 4.0–10.5)

## 2013-10-15 MED ORDER — DICYCLOMINE HCL 20 MG PO TABS: 20.0000 mg | ORAL_TABLET | Freq: Four times a day (QID) | ORAL | Status: DC | PRN
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
## 2013-10-15 NOTE — ED Provider Notes (Signed)
Medical screening examination/treatment/procedure(s) were performed by non-physician practitioner and as supervising physician I was immediately available for consultation/collaboration.  Leslee Home, M.D.  Reuben Likes, MD 10/15/13 [phone]
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
## 2013-10-16 ENCOUNTER — Encounter (HOSPITAL_COMMUNITY): Payer: Self-pay | Admitting: Emergency Medicine

## 2013-10-16 ENCOUNTER — Emergency Department (HOSPITAL_COMMUNITY)
Admission: EM | Admit: 2013-10-16 | Discharge: 2013-10-17 | Disposition: A | Discharge status: Home / Self Care | Payer: No Typology Code available for payment source | Attending: Emergency Medicine | Admitting: Emergency Medicine

## 2013-10-16 DIAGNOSIS — R197 Diarrhea, unspecified: Secondary | ICD-10-CM | POA: Insufficient documentation

## 2013-10-16 DIAGNOSIS — G8929 Other chronic pain: Secondary | ICD-10-CM | POA: Insufficient documentation

## 2013-10-16 DIAGNOSIS — F909 Attention-deficit hyperactivity disorder, unspecified type: Secondary | ICD-10-CM | POA: Insufficient documentation

## 2013-10-16 DIAGNOSIS — F17200 Nicotine dependence, unspecified, uncomplicated: Secondary | ICD-10-CM | POA: Insufficient documentation

## 2013-10-16 DIAGNOSIS — Z872 Personal history of diseases of the skin and subcutaneous tissue: Secondary | ICD-10-CM | POA: Insufficient documentation

## 2013-10-16 DIAGNOSIS — Z79899 Other long term (current) drug therapy: Secondary | ICD-10-CM | POA: Insufficient documentation

## 2013-10-16 DIAGNOSIS — R112 Nausea with vomiting, unspecified: Secondary | ICD-10-CM | POA: Insufficient documentation

## 2013-10-16 DIAGNOSIS — Z8719 Personal history of other diseases of the digestive system: Secondary | ICD-10-CM | POA: Insufficient documentation

## 2013-10-16 DIAGNOSIS — Z87828 Personal history of other (healed) physical injury and trauma: Secondary | ICD-10-CM | POA: Insufficient documentation

## 2013-10-16 DIAGNOSIS — R1084 Generalized abdominal pain: Secondary | ICD-10-CM | POA: Insufficient documentation

## 2013-10-16 MED ORDER — ONDANSETRON 4 MG PO TBDP
4.0000 mg | ORAL_TABLET | Freq: Once | ORAL | Status: AC
  Administered 2013-10-16: 4 mg via ORAL
  Filled 2013-10-16: qty 1

## 2013-10-16 MED ORDER — DICYCLOMINE HCL 20 MG PO TABS
10.0000 mg | ORAL_TABLET | Freq: Once | ORAL | Status: AC
  Administered 2013-10-16: 20 mg via ORAL
  Filled 2013-10-16: qty 1

## 2013-10-16 NOTE — ED Notes (Signed)
Pt vomiting at this time unable to get vital signs vomiting on the floor refusing to let us help.Green giving for emesis but continue to vomit on the floor green bile in color
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
## 2013-10-16 NOTE — ED Notes (Signed)
Pt here per ems for c/o ad pain x1 day started this am with some n/v/d seen 10/15/13 for same

## 2013-10-16 NOTE — ED Notes (Addendum)
Unable to obtain vital sign.  Pt moving around in the bed complaining of pain.

## 2013-10-17 MED ORDER — HYDROMORPHONE HCL PF 1 MG/ML IJ SOLN
1.0000 mg | Freq: Once | INTRAMUSCULAR | Status: AC
  Administered 2013-10-17: 1 mg via INTRAMUSCULAR
  Filled 2013-10-17: qty 1

## 2013-10-17 NOTE — ED Provider Notes (Signed)
Medical screening examination/treatment/procedure(s) were conducted as a shared visit with non-physician practitioner(s) and myself.  I personally evaluated the patient during the encounter.   Patient complaining of severe persistent unchanged abdominal pain with history of chronic abdominal pain and multiple ED visits for the same.  On exam he has well-healed surgical scars, otherwise soft, nondistended. No rebound or guarding or acute abdomen on exam. Given lab was within 24 hours, do not feel repeating them are indicated at this time.  Medications provided and condition improved. Plan no narcotics given chronic nature of symptoms. I strongly encouraged patient to follow up with gastroenterologist.   Sunnie Nielsen, MD 10/17/13 [phone]

## 2013-10-17 NOTE — ED Notes (Signed)
Pt refusing to leave wanting more pain meds EDMD aware security called and at bedside pt is cooperating at this time d/c instructions give refuse to sign E-sign.

## 2013-10-20 ENCOUNTER — Emergency Department (HOSPITAL_COMMUNITY)
Admission: EM | Admit: 2013-10-20 | Discharge: 2013-10-20 | Disposition: A | Discharge status: Home / Self Care | Payer: No Typology Code available for payment source | Attending: Emergency Medicine | Admitting: Emergency Medicine

## 2013-10-20 ENCOUNTER — Encounter (HOSPITAL_COMMUNITY): Payer: Self-pay | Admitting: Emergency Medicine

## 2013-10-20 ENCOUNTER — Emergency Department (HOSPITAL_COMMUNITY): Payer: No Typology Code available for payment source

## 2013-10-20 DIAGNOSIS — E860 Dehydration: Secondary | ICD-10-CM | POA: Insufficient documentation

## 2013-10-20 DIAGNOSIS — Z885 Allergy status to narcotic agent status: Secondary | ICD-10-CM | POA: Insufficient documentation

## 2013-10-20 DIAGNOSIS — Z8719 Personal history of other diseases of the digestive system: Secondary | ICD-10-CM | POA: Insufficient documentation

## 2013-10-20 DIAGNOSIS — R319 Hematuria, unspecified: Secondary | ICD-10-CM | POA: Insufficient documentation

## 2013-10-20 DIAGNOSIS — Z8669 Personal history of other diseases of the nervous system and sense organs: Secondary | ICD-10-CM | POA: Insufficient documentation

## 2013-10-20 DIAGNOSIS — R109 Unspecified abdominal pain: Secondary | ICD-10-CM | POA: Insufficient documentation

## 2013-10-20 DIAGNOSIS — R110 Nausea: Secondary | ICD-10-CM | POA: Insufficient documentation

## 2013-10-20 DIAGNOSIS — F17200 Nicotine dependence, unspecified, uncomplicated: Secondary | ICD-10-CM | POA: Insufficient documentation

## 2013-10-20 DIAGNOSIS — Z79899 Other long term (current) drug therapy: Secondary | ICD-10-CM | POA: Insufficient documentation

## 2013-10-20 DIAGNOSIS — G8929 Other chronic pain: Secondary | ICD-10-CM | POA: Insufficient documentation

## 2013-10-20 DIAGNOSIS — Z888 Allergy status to other drugs, medicaments and biological substances status: Secondary | ICD-10-CM | POA: Insufficient documentation

## 2013-10-20 LAB — URINALYSIS, ROUTINE W REFLEX MICROSCOPIC
Glucose, UA: NEGATIVE mg/dL
Hgb urine dipstick: NEGATIVE
Ketones, ur: 15 mg/dL — AB
Nitrite: NEGATIVE
Protein, ur: 30 mg/dL — AB
Specific Gravity, Urine: 1.03 (ref 1.005–1.030)
Urobilinogen, UA: 1 mg/dL (ref 0.0–1.0)
pH: 5.5 (ref 5.0–8.0)

## 2013-10-20 LAB — CBC
HCT: 44 % (ref 39.0–52.0)
Hemoglobin: 16.3 g/dL (ref 13.0–17.0)
MCH: 32.8 pg (ref 26.0–34.0)
MCHC: 37 g/dL — ABNORMAL HIGH (ref 30.0–36.0)
MCV: 88.5 fL (ref 78.0–100.0)
Platelets: 255 10*3/uL (ref 150–400)
RBC: 4.97 MIL/uL (ref 4.22–5.81)
RDW: 12.4 % (ref 11.5–15.5)
WBC: 12.9 10*3/uL — ABNORMAL HIGH (ref 4.0–10.5)

## 2013-10-20 LAB — HEPATIC FUNCTION PANEL
ALT: 25 U/L (ref 0–53)
AST: 38 U/L — ABNORMAL HIGH (ref 0–37)
Albumin: 4.5 g/dL (ref 3.5–5.2)
Alkaline Phosphatase: 77 U/L (ref 39–117)
Bilirubin, Direct: 0.1 mg/dL (ref 0.0–0.3)
Indirect Bilirubin: 0.5 mg/dL (ref 0.3–0.9)
Total Bilirubin: 0.6 mg/dL (ref 0.3–1.2)
Total Protein: 8.7 g/dL — ABNORMAL HIGH (ref 6.0–8.3)

## 2013-10-20 LAB — POCT I-STAT, CHEM 8
BUN: 10 mg/dL (ref 6–23)
Calcium, Ion: 1.19 mmol/L (ref 1.12–1.23)
Chloride: 97 mEq/L (ref 96–112)
Creatinine, Ser: 1.4 mg/dL — ABNORMAL HIGH (ref 0.50–1.35)
Glucose, Bld: 119 mg/dL — ABNORMAL HIGH (ref 70–99)
HCT: 47 % (ref 39.0–52.0)
Hemoglobin: 16 g/dL (ref 13.0–17.0)
Potassium: 3.5 mEq/L (ref 3.5–5.1)
Sodium: 135 mEq/L (ref 135–145)
TCO2: 24 mmol/L (ref 0–100)

## 2013-10-20 LAB — URINE MICROSCOPIC-ADD ON

## 2013-10-20 LAB — LIPASE, BLOOD: Lipase: 23 U/L (ref 11–59)

## 2013-10-20 MED ORDER — ONDANSETRON HCL 4 MG PO TABS: 4.0000 mg | ORAL_TABLET | Freq: Four times a day (QID) | ORAL | Status: DC

## 2013-10-20 MED ORDER — SODIUM CHLORIDE 0.9 % IV BOLUS (SEPSIS)
1000.0000 mL | Freq: Once | INTRAVENOUS | Status: AC
  Administered 2013-10-20: 1000 mL via INTRAVENOUS

## 2013-10-20 MED ORDER — ACETAMINOPHEN 325 MG PO TABS
650.0000 mg | ORAL_TABLET | Freq: Once | ORAL | Status: AC
  Administered 2013-10-20: 650 mg via ORAL
  Filled 2013-10-20: qty 2

## 2013-10-20 MED ORDER — DICYCLOMINE HCL 10 MG PO CAPS
10.0000 mg | ORAL_CAPSULE | Freq: Once | ORAL | Status: AC
  Administered 2013-10-20: 10 mg via ORAL
  Filled 2013-10-20: qty 1

## 2013-10-20 MED ORDER — ONDANSETRON 4 MG PO TBDP
4.0000 mg | ORAL_TABLET | Freq: Once | ORAL | Status: AC
  Administered 2013-10-20: 4 mg via ORAL
  Filled 2013-10-20: qty 1
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
## 2013-10-20 NOTE — ED Notes (Signed)
Pt c/o chronic abdominal pain

## 2013-10-20 NOTE — ED Notes (Signed)
Pt c/o of pain in his abdomen, chronic
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
## 2013-11-07 ENCOUNTER — Encounter: Payer: Self-pay | Admitting: Internal Medicine

## 2013-11-07 ENCOUNTER — Ambulatory Visit: Payer: No Typology Code available for payment source | Attending: Internal Medicine | Admitting: Internal Medicine

## 2013-11-07 VITALS — BP 144/85 | HR 75 | Temp 98.7°F | Resp 14 | Ht 70.0 in | Wt 141.8 lb

## 2013-11-07 DIAGNOSIS — R1011 Right upper quadrant pain: Secondary | ICD-10-CM | POA: Insufficient documentation

## 2013-11-07 DIAGNOSIS — R112 Nausea with vomiting, unspecified: Secondary | ICD-10-CM

## 2013-11-07 LAB — POCT URINALYSIS DIPSTICK
Glucose, UA: NEGATIVE
Leukocytes, UA: NEGATIVE
Nitrite, UA: NEGATIVE
Spec Grav, UA: >=1.03
Urobilinogen, UA: 0.2
pH, UA: 5.5

## 2013-11-07 MED ORDER — ONDANSETRON HCL 4 MG PO TABS: 4.0000 mg | ORAL_TABLET | Freq: Three times a day (TID) | ORAL | Status: DC | PRN

## 2013-11-07 MED ORDER — ONDANSETRON HCL 8 MG PO TABS: 8.0000 mg | ORAL_TABLET | Freq: Three times a day (TID) | ORAL | Status: DC | PRN

## 2013-11-07 NOTE — Progress Notes (Signed)
Pt is here for a f/u for medication refill.
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
## 2013-11-13 ENCOUNTER — Ambulatory Visit: Payer: No Typology Code available for payment source

## 2013-12-02 ENCOUNTER — Encounter (HOSPITAL_COMMUNITY): Payer: Self-pay | Admitting: Emergency Medicine

## 2013-12-02 DIAGNOSIS — R109 Unspecified abdominal pain: Secondary | ICD-10-CM | POA: Insufficient documentation

## 2013-12-02 DIAGNOSIS — R197 Diarrhea, unspecified: Secondary | ICD-10-CM | POA: Insufficient documentation

## 2013-12-02 DIAGNOSIS — F17200 Nicotine dependence, unspecified, uncomplicated: Secondary | ICD-10-CM | POA: Insufficient documentation

## 2013-12-02 DIAGNOSIS — R112 Nausea with vomiting, unspecified: Secondary | ICD-10-CM | POA: Insufficient documentation

## 2013-12-02 LAB — COMPREHENSIVE METABOLIC PANEL
ALT: 20 U/L (ref 0–53)
AST: 21 U/L (ref 0–37)
Albumin: 3.7 g/dL (ref 3.5–5.2)
Alkaline Phosphatase: 60 U/L (ref 39–117)
BUN: 11 mg/dL (ref 6–23)
CO2: 26 mEq/L (ref 19–32)
Calcium: 9.1 mg/dL (ref 8.4–10.5)
Chloride: 103 mEq/L (ref 96–112)
Creatinine, Ser: 0.87 mg/dL (ref 0.50–1.35)
GFR calc Af Amer: >90 mL/min (ref >90)
GFR calc non Af Amer: >90 mL/min (ref >90)
Glucose, Bld: 119 mg/dL — ABNORMAL HIGH (ref 70–99)
Potassium: 4.3 mEq/L (ref 3.7–5.3)
Sodium: 140 mEq/L (ref 137–147)
Total Bilirubin: 0.2 mg/dL — ABNORMAL LOW (ref 0.3–1.2)
Total Protein: 7.3 g/dL (ref 6.0–8.3)

## 2013-12-02 LAB — CBC WITH DIFFERENTIAL/PLATELET
Basophils Absolute: 0 10*3/uL (ref 0.0–0.1)
Basophils Relative: 0 % (ref 0–1)
Eosinophils Absolute: 0.2 10*3/uL (ref 0.0–0.7)
Eosinophils Relative: 2 % (ref 0–5)
HCT: 41 % (ref 39.0–52.0)
Hemoglobin: 14.4 g/dL (ref 13.0–17.0)
Lymphocytes Relative: 42 % (ref 12–46)
Lymphs Abs: 3.5 10*3/uL (ref 0.7–4.0)
MCH: 32.2 pg (ref 26.0–34.0)
MCHC: 35.1 g/dL (ref 30.0–36.0)
MCV: 91.7 fL (ref 78.0–100.0)
Monocytes Absolute: 0.6 10*3/uL (ref 0.1–1.0)
Monocytes Relative: 8 % (ref 3–12)
Neutro Abs: 4.1 10*3/uL (ref 1.7–7.7)
Neutrophils Relative %: 49 % (ref 43–77)
Platelets: 208 10*3/uL (ref 150–400)
RBC: 4.47 MIL/uL (ref 4.22–5.81)
RDW: 13.1 % (ref 11.5–15.5)
WBC: 8.4 10*3/uL (ref 4.0–10.5)

## 2013-12-02 LAB — URINALYSIS, ROUTINE W REFLEX MICROSCOPIC
Glucose, UA: NEGATIVE mg/dL
Ketones, ur: 15 mg/dL — AB
Leukocytes, UA: NEGATIVE
Nitrite: NEGATIVE
Protein, ur: NEGATIVE mg/dL
Specific Gravity, Urine: 1.034 — ABNORMAL HIGH (ref 1.005–1.030)
Urobilinogen, UA: 1 mg/dL (ref 0.0–1.0)
pH: 5.5 (ref 5.0–8.0)

## 2013-12-02 LAB — URINE MICROSCOPIC-ADD ON

## 2013-12-02 LAB — LIPASE, BLOOD: Lipase: 44 U/L (ref 11–59)

## 2013-12-02 NOTE — ED Notes (Signed)
Pt. arrived with EMS from home reports right abdominal pain with nausea , emesis and diarrhea onset today , denies fever or chills.

## 2013-12-03 ENCOUNTER — Emergency Department (HOSPITAL_COMMUNITY)
Admission: EM | Admit: 2013-12-03 | Discharge: 2013-12-03 | Disposition: A | Discharge status: Home / Self Care | Payer: No Typology Code available for payment source | Attending: Emergency Medicine | Admitting: Emergency Medicine

## 2013-12-03 ENCOUNTER — Emergency Department (HOSPITAL_COMMUNITY)
Admission: EM | Admit: 2013-12-03 | Discharge: 2013-12-03 | Discharge status: Against Medical Advice | Payer: No Typology Code available for payment source | Attending: Emergency Medicine | Admitting: Emergency Medicine

## 2013-12-03 ENCOUNTER — Encounter (HOSPITAL_COMMUNITY): Payer: Self-pay | Admitting: Emergency Medicine

## 2013-12-03 DIAGNOSIS — G8929 Other chronic pain: Secondary | ICD-10-CM

## 2013-12-03 DIAGNOSIS — R1013 Epigastric pain: Secondary | ICD-10-CM | POA: Insufficient documentation

## 2013-12-03 DIAGNOSIS — F17200 Nicotine dependence, unspecified, uncomplicated: Secondary | ICD-10-CM | POA: Insufficient documentation

## 2013-12-03 DIAGNOSIS — Z872 Personal history of diseases of the skin and subcutaneous tissue: Secondary | ICD-10-CM | POA: Insufficient documentation

## 2013-12-03 DIAGNOSIS — Z87828 Personal history of other (healed) physical injury and trauma: Secondary | ICD-10-CM | POA: Insufficient documentation

## 2013-12-03 DIAGNOSIS — Z8719 Personal history of other diseases of the digestive system: Secondary | ICD-10-CM | POA: Insufficient documentation

## 2013-12-03 DIAGNOSIS — R1012 Left upper quadrant pain: Secondary | ICD-10-CM | POA: Insufficient documentation

## 2013-12-03 MED ORDER — DICYCLOMINE HCL 10 MG/ML IM SOLN
20.0000 mg | Freq: Once | INTRAMUSCULAR | Status: AC
  Administered 2013-12-03: 20 mg via INTRAMUSCULAR
  Filled 2013-12-03: qty 2

## 2013-12-03 MED ORDER — DICYCLOMINE HCL 20 MG PO TABS: 20.0000 mg | ORAL_TABLET | Freq: Four times a day (QID) | ORAL | Status: DC | PRN

## 2013-12-03 MED ORDER — ONDANSETRON 4 MG PO TBDP
4.0000 mg | ORAL_TABLET | Freq: Once | ORAL | Status: AC
  Administered 2013-12-03: 4 mg via ORAL
  Filled 2013-12-03: qty 1

## 2013-12-03 MED ORDER — ONDANSETRON HCL 4 MG PO TABS: 8.0000 mg | ORAL_TABLET | Freq: Three times a day (TID) | ORAL | Status: DC | PRN

## 2013-12-03 MED ORDER — ONDANSETRON HCL 4 MG PO TABS: 4.0000 mg | ORAL_TABLET | Freq: Three times a day (TID) | ORAL | Status: DC | PRN

## 2013-12-03 NOTE — ED Provider Notes (Signed)
Medical screening examination/treatment/procedure(s) were performed by non-physician practitioner and as supervising physician I was immediately available for consultation/collaboration.  EKG Interpretation   None        Hurman Horn, MD 12/03/13 2056

## 2013-12-03 NOTE — ED Notes (Signed)
Pt. Stated, i was here yesterday and left with abdominal pain.  I still having abdominal pain with some nausea and diarrhea.
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
## 2013-12-03 NOTE — ED Notes (Signed)
NO ANSWER

## 2013-12-03 NOTE — ED Notes (Signed)
Pt called multuple times and have had no answer all times.  Pt moved to OTF.

## 2013-12-07 ENCOUNTER — Encounter (HOSPITAL_COMMUNITY): Payer: Self-pay | Admitting: Emergency Medicine

## 2013-12-07 ENCOUNTER — Emergency Department (HOSPITAL_COMMUNITY)
Admission: EM | Admit: 2013-12-07 | Discharge: 2013-12-07 | Disposition: A | Discharge status: Home / Self Care | Payer: Self-pay | Attending: Emergency Medicine | Admitting: Emergency Medicine

## 2013-12-07 DIAGNOSIS — Z8719 Personal history of other diseases of the digestive system: Secondary | ICD-10-CM | POA: Insufficient documentation

## 2013-12-07 DIAGNOSIS — G8929 Other chronic pain: Secondary | ICD-10-CM | POA: Insufficient documentation

## 2013-12-07 DIAGNOSIS — F17200 Nicotine dependence, unspecified, uncomplicated: Secondary | ICD-10-CM | POA: Insufficient documentation

## 2013-12-07 DIAGNOSIS — F411 Generalized anxiety disorder: Secondary | ICD-10-CM | POA: Insufficient documentation

## 2013-12-07 DIAGNOSIS — Z87828 Personal history of other (healed) physical injury and trauma: Secondary | ICD-10-CM | POA: Insufficient documentation

## 2013-12-07 DIAGNOSIS — Z872 Personal history of diseases of the skin and subcutaneous tissue: Secondary | ICD-10-CM | POA: Insufficient documentation

## 2013-12-07 MED ORDER — DICYCLOMINE HCL 10 MG PO CAPS
10.0000 mg | ORAL_CAPSULE | Freq: Once | ORAL | Status: AC
  Administered 2013-12-07: 10 mg via ORAL
  Filled 2013-12-07: qty 1

## 2013-12-07 MED ORDER — ONDANSETRON HCL 8 MG PO TABS: 8.0000 mg | ORAL_TABLET | Freq: Three times a day (TID) | ORAL | Status: DC | PRN

## 2013-12-07 MED ORDER — ONDANSETRON 8 MG PO TBDP
8.0000 mg | ORAL_TABLET | Freq: Once | ORAL | Status: AC
  Administered 2013-12-07: 8 mg via ORAL
  Filled 2013-12-07: qty 1

## 2013-12-07 NOTE — ED Notes (Signed)
Pt agitated at this time about not having anything else ordered for pain. Zofran administered now awaiting for Bentyl availability from pharmacy and then will administer. Pt aware of this situation but still angry.

## 2013-12-07 NOTE — ED Notes (Signed)
Bed: OF12 Expected date: 12/07/13 Expected time: 6:43 AM Means of arrival: Ambulance Comments: 48 yo M  abd pain
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
## 2013-12-07 NOTE — Discharge Instructions (Signed)

## 2013-12-09 ENCOUNTER — Telehealth: Payer: Self-pay

## 2013-12-09 ENCOUNTER — Encounter (HOSPITAL_COMMUNITY): Payer: Self-pay | Admitting: Emergency Medicine

## 2013-12-09 ENCOUNTER — Emergency Department (HOSPITAL_COMMUNITY)
Admission: EM | Admit: 2013-12-09 | Discharge: 2013-12-09 | Discharge status: Against Medical Advice | Payer: Self-pay | Attending: Emergency Medicine | Admitting: Emergency Medicine

## 2013-12-09 DIAGNOSIS — R109 Unspecified abdominal pain: Secondary | ICD-10-CM | POA: Insufficient documentation

## 2013-12-09 DIAGNOSIS — F17200 Nicotine dependence, unspecified, uncomplicated: Secondary | ICD-10-CM | POA: Insufficient documentation

## 2013-12-09 NOTE — ED Notes (Signed)
Pt in c/o abd pain since 5pm yesterday, seen at [facility] for same yesterday, yelling in triage

## 2013-12-28 ENCOUNTER — Emergency Department (HOSPITAL_COMMUNITY)
Admission: EM | Admit: 2013-12-28 | Discharge: 2013-12-28 | Disposition: A | Discharge status: Home / Self Care | Payer: Self-pay | Attending: Emergency Medicine | Admitting: Emergency Medicine

## 2013-12-28 ENCOUNTER — Encounter (HOSPITAL_COMMUNITY): Payer: Self-pay | Admitting: Emergency Medicine

## 2013-12-28 DIAGNOSIS — Z872 Personal history of diseases of the skin and subcutaneous tissue: Secondary | ICD-10-CM | POA: Insufficient documentation

## 2013-12-28 DIAGNOSIS — F17200 Nicotine dependence, unspecified, uncomplicated: Secondary | ICD-10-CM | POA: Insufficient documentation

## 2013-12-28 DIAGNOSIS — Z87828 Personal history of other (healed) physical injury and trauma: Secondary | ICD-10-CM | POA: Insufficient documentation

## 2013-12-28 DIAGNOSIS — R1084 Generalized abdominal pain: Secondary | ICD-10-CM | POA: Insufficient documentation

## 2013-12-28 DIAGNOSIS — G8929 Other chronic pain: Secondary | ICD-10-CM | POA: Insufficient documentation

## 2013-12-28 DIAGNOSIS — R109 Unspecified abdominal pain: Secondary | ICD-10-CM

## 2013-12-28 DIAGNOSIS — Z8719 Personal history of other diseases of the digestive system: Secondary | ICD-10-CM | POA: Insufficient documentation

## 2013-12-28 MED ORDER — ONDANSETRON 4 MG PO TBDP
8.0000 mg | ORAL_TABLET | Freq: Once | ORAL | Status: AC
  Administered 2013-12-28: 8 mg via ORAL
  Filled 2013-12-28: qty 2

## 2013-12-28 MED ORDER — ONDANSETRON HCL 8 MG PO TABS: 8.0000 mg | ORAL_TABLET | ORAL | Status: DC | PRN

## 2013-12-28 NOTE — ED Notes (Signed)
Pt to ED via [facility] for evaluation of RLQ abdominal for the past 2 days.  Pt states he was dx with kidney stones 2 weeks ago.  Pt yelling upon arrival to ED.  VSS
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
## 2013-12-28 NOTE — Discharge Instructions (Signed)
Zofran as needed for nausea.  Followup with your primary Dr. to discuss your chronic pain.   Chronic Pain Chronic pain can be defined as pain that is off and on and lasts for 3 6 months or longer. Many things cause chronic pain, which can make it difficult to make a diagnosis. There are many treatment options available for chronic pain. However, finding a treatment that works well for you may require trying various approaches until the right one is found. Many people benefit from a combination of two or more types of treatment to control their pain. SYMPTOMS  Chronic pain can occur anywhere in the body and can range from mild to very severe. Some types of chronic pain include:  Headache.  Low back pain.  Cancer pain.  Arthritis pain.  Neurogenic pain. This is pain resulting from damage to nerves. People with chronic pain may also have other symptoms such as:  Depression.  Anger.  Insomnia.  Anxiety. DIAGNOSIS  Your health care provider will help diagnose your condition over time. In many cases, the initial focus will be on excluding possible conditions that could be causing the pain. Depending on your symptoms, your health care provider may order tests to diagnose your condition. Some of these tests may include:   Blood tests.   CT scan.   MRI.   X-rays.   Ultrasounds.   Nerve conduction studies.  You may need to see a specialist.  TREATMENT  Finding treatment that works well may take time. You may be referred to a pain specialist. He or she may prescribe medicine or therapies, such as:   Mindful meditation or yoga.  Shots (injections) of numbing or pain-relieving medicines into the spine or area of pain.  Local electrical stimulation.  Acupuncture.   Massage therapy.   Aroma, color, light, or sound therapy.   Biofeedback.   Working with a physical therapist to keep from getting stiff.   Regular, gentle exercise.   Cognitive or behavioral  therapy.   Group support.  Sometimes, surgery may be recommended.  HOME CARE INSTRUCTIONS   Take all medicines as directed by your health care provider.   Lessen stress in your life by relaxing and doing things such as listening to calming music.   Exercise or be active as directed by your health care provider.   Eat a healthy diet and include things such as vegetables, fruits, fish, and lean meats in your diet.   Keep all follow-up appointments with your health care provider.   Attend a support group with others suffering from chronic pain. SEEK MEDICAL CARE IF:   Your pain gets worse.   You develop a new pain that was not there before.   You cannot tolerate medicines given to you by your health care provider.   You have new symptoms since your last visit with your health care provider.  SEEK IMMEDIATE MEDICAL CARE IF:   You feel weak.   You have decreased sensation or numbness.   You lose control of bowel or bladder function.   Your pain suddenly gets much worse.   You develop shaking.  You develop chills.  You develop confusion.  You develop chest pain.  You develop shortness of breath.  MAKE SURE YOU:  Understand these instructions.  Will watch your condition.  Will get help right away if you are not doing well or get worse. Document Released: 08/12/2002 Document Revised: 07/23/2013 Document Reviewed: 05/16/2013 [facility] Patient Information 2014 [place].

## 2013-12-30 ENCOUNTER — Emergency Department (HOSPITAL_COMMUNITY): Payer: Self-pay

## 2013-12-30 ENCOUNTER — Encounter (HOSPITAL_COMMUNITY): Payer: Self-pay | Admitting: Emergency Medicine

## 2013-12-30 ENCOUNTER — Emergency Department (HOSPITAL_COMMUNITY)
Admission: EM | Admit: 2013-12-30 | Discharge: 2013-12-30 | Disposition: A | Discharge status: Home / Self Care | Payer: Self-pay | Attending: Emergency Medicine | Admitting: Emergency Medicine

## 2013-12-30 DIAGNOSIS — R197 Diarrhea, unspecified: Secondary | ICD-10-CM | POA: Insufficient documentation

## 2013-12-30 DIAGNOSIS — R109 Unspecified abdominal pain: Secondary | ICD-10-CM

## 2013-12-30 DIAGNOSIS — Z8719 Personal history of other diseases of the digestive system: Secondary | ICD-10-CM | POA: Insufficient documentation

## 2013-12-30 DIAGNOSIS — R112 Nausea with vomiting, unspecified: Secondary | ICD-10-CM | POA: Insufficient documentation

## 2013-12-30 DIAGNOSIS — G8929 Other chronic pain: Secondary | ICD-10-CM | POA: Insufficient documentation

## 2013-12-30 DIAGNOSIS — F17200 Nicotine dependence, unspecified, uncomplicated: Secondary | ICD-10-CM | POA: Insufficient documentation

## 2013-12-30 DIAGNOSIS — R1031 Right lower quadrant pain: Secondary | ICD-10-CM | POA: Insufficient documentation

## 2013-12-30 DIAGNOSIS — Z872 Personal history of diseases of the skin and subcutaneous tissue: Secondary | ICD-10-CM | POA: Insufficient documentation

## 2013-12-30 DIAGNOSIS — G8911 Acute pain due to trauma: Secondary | ICD-10-CM | POA: Insufficient documentation

## 2013-12-30 LAB — COMPREHENSIVE METABOLIC PANEL
ALT: 27 U/L (ref 0–53)
AST: 24 U/L (ref 0–37)
Albumin: 3.8 g/dL (ref 3.5–5.2)
Alkaline Phosphatase: 68 U/L (ref 39–117)
BUN: 7 mg/dL (ref 6–23)
CO2: 23 mEq/L (ref 19–32)
Calcium: 9.4 mg/dL (ref 8.4–10.5)
Chloride: 104 mEq/L (ref 96–112)
Creatinine, Ser: 0.98 mg/dL (ref 0.50–1.35)
GFR calc Af Amer: >90 mL/min (ref >90)
GFR calc non Af Amer: >90 mL/min (ref >90)
Glucose, Bld: 109 mg/dL — ABNORMAL HIGH (ref 70–99)
Potassium: 3.9 mEq/L (ref 3.7–5.3)
Sodium: 140 mEq/L (ref 137–147)
Total Bilirubin: 0.5 mg/dL (ref 0.3–1.2)
Total Protein: 7.6 g/dL (ref 6.0–8.3)

## 2013-12-30 LAB — CBC WITH DIFFERENTIAL/PLATELET
Basophils Absolute: 0 10*3/uL (ref 0.0–0.1)
Basophils Relative: 0 % (ref 0–1)
Eosinophils Absolute: 0.1 10*3/uL (ref 0.0–0.7)
Eosinophils Relative: 1 % (ref 0–5)
HCT: 39.8 % (ref 39.0–52.0)
Hemoglobin: 14.4 g/dL (ref 13.0–17.0)
Lymphocytes Relative: 30 % (ref 12–46)
Lymphs Abs: 3.1 10*3/uL (ref 0.7–4.0)
MCH: 32.4 pg (ref 26.0–34.0)
MCHC: 36.2 g/dL — ABNORMAL HIGH (ref 30.0–36.0)
MCV: 89.4 fL (ref 78.0–100.0)
Monocytes Absolute: 0.7 10*3/uL (ref 0.1–1.0)
Monocytes Relative: 7 % (ref 3–12)
Neutro Abs: 6.4 10*3/uL (ref 1.7–7.7)
Neutrophils Relative %: 62 % (ref 43–77)
Platelets: 240 10*3/uL (ref 150–400)
RBC: 4.45 MIL/uL (ref 4.22–5.81)
RDW: 13 % (ref 11.5–15.5)
WBC: 10.4 10*3/uL (ref 4.0–10.5)

## 2013-12-30 LAB — LIPASE, BLOOD: Lipase: 57 U/L (ref 11–59)

## 2013-12-30 LAB — CG4 I-STAT (LACTIC ACID): Lactic Acid, Venous: 1.28 mmol/L (ref 0.5–2.2)

## 2013-12-30 MED ORDER — ONDANSETRON HCL 4 MG/2ML IJ SOLN
4.0000 mg | Freq: Once | INTRAMUSCULAR | Status: AC
  Administered 2013-12-30: 4 mg via INTRAVENOUS
  Filled 2013-12-30: qty 2

## 2013-12-30 MED ORDER — HYDROMORPHONE HCL PF 1 MG/ML IJ SOLN
0.5000 mg | Freq: Once | INTRAMUSCULAR | Status: AC
  Administered 2013-12-30: 0.5 mg via INTRAVENOUS
  Filled 2013-12-30: qty 1
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
## 2013-12-30 NOTE — ED Notes (Signed)
Pt. reports abdominal pain n/v/d that started 2 days ago after running out of his medications.  Pt. reports that he has had sick contacts at home but no fevers.

## 2013-12-30 NOTE — Discharge Planning (Signed)
[name], Community Liaison  Patient was educated on the importance of using his primary care. Follow up appointment with patients PCP ([facility]) Thursday Feb 5,2015 at 10:00am, patient will also be renewing his orange card after this appointment. Patient is aware of both appointments and was given my contact information for any questions or concerns. I also submitted a Case management referral through his orange card to follow up with the patient and to reduce the number of ED visits.

## 2013-12-30 NOTE — Discharge Instructions (Signed)
Chronic Pain Discharge Instructions  °Emergency care providers appreciate that many patients coming to us are in severe pain and we wish to address their pain in the safest, most responsible manner.  It is important to recognize however, that the proper treatment of chronic pain differs from that of the pain of injuries and acute illnesses.  Our goal is to provide quality, safe, personalized care and we thank you for giving us the opportunity to serve you. °The use of narcotics and related agents for chronic pain syndromes may lead to additional physical and psychological problems.  Nearly as many people die from prescription narcotics each year as die from car crashes.  Additionally, this risk is increased if such prescriptions are obtained from a variety of sources.  Therefore, only your primary care physician or a pain management specialist is able to safely treat such syndromes with narcotic medications long-term.   ° °Documentation revealing such prescriptions have been sought from multiple sources may prohibit us from providing a refill or different narcotic medication.  Your name may be checked first through the [place] Controlled Substances Reporting System.  This database is a record of controlled substance medication prescriptions that the patient has received.  This has been established by [facility] in an effort to eliminate the dangerous, and often life threatening, practice of obtaining multiple prescriptions from different medical providers.  ° °If you have a chronic pain syndrome (i.e. chronic headaches, recurrent back or neck pain, dental pain, abdominal or pelvis pain without a specific diagnosis, or neuropathic pain such as fibromyalgia) or recurrent visits for the same condition without an acute diagnosis, you may be treated with non-narcotics and other non-addictive medicines.  Allergic reactions or negative side effects that may be reported by a patient to such medications will not  typically lead to the use of a narcotic analgesic or other controlled substance as an alternative. °  °Patients managing chronic pain with a personal physician should have provisions in place for breakthrough pain.  If you are in crisis, you should call your physician.  If your physician directs you to the emergency department, please have the doctor call and speak to our attending physician concerning your care. °  °When patients come to the Emergency Department (ED) with acute medical conditions in which the Emergency Department physician feels appropriate to prescribe narcotic or sedating pain medication, the physician will prescribe these in very limited quantities.  The amount of these medications will last only until you can see your primary care physician in his/her office.  Any patient who returns to the ED seeking refills should expect only non-narcotic pain medications.  ° °In the event of an acute medical condition exists and the emergency physician feels it is necessary that the patient be given a narcotic or sedating medication -  a responsible adult driver should be present in the room prior to the medication being given by the nurse. °  °Prescriptions for narcotic or sedating medications that have been lost, stolen or expired will not be refilled in the Emergency Department.   ° °Patients who have chronic pain may receive non-narcotic prescriptions until seen by their primary care physician.  It is every patient’s personal responsibility to maintain active prescriptions with his or her primary care physician or specialist. °

## 2014-01-08 ENCOUNTER — Ambulatory Visit: Payer: Self-pay | Admitting: Internal Medicine

## 2014-01-08 ENCOUNTER — Ambulatory Visit: Payer: Self-pay

## 2014-01-15 ENCOUNTER — Encounter: Payer: Self-pay | Admitting: Internal Medicine

## 2014-01-15 ENCOUNTER — Ambulatory Visit: Payer: No Typology Code available for payment source | Attending: Internal Medicine | Admitting: Internal Medicine

## 2014-01-15 ENCOUNTER — Ambulatory Visit: Payer: No Typology Code available for payment source

## 2014-01-15 VITALS — BP 118/89 | HR 83 | Temp 98.9°F | Resp 16 | Ht 70.0 in | Wt 149.0 lb

## 2014-01-15 DIAGNOSIS — R1011 Right upper quadrant pain: Secondary | ICD-10-CM

## 2014-01-15 DIAGNOSIS — R1031 Right lower quadrant pain: Secondary | ICD-10-CM

## 2014-01-15 DIAGNOSIS — R112 Nausea with vomiting, unspecified: Secondary | ICD-10-CM

## 2014-01-15 DIAGNOSIS — G8929 Other chronic pain: Secondary | ICD-10-CM

## 2014-01-15 MED ORDER — DICYCLOMINE HCL 20 MG PO TABS: 20.0000 mg | ORAL_TABLET | Freq: Two times a day (BID) | ORAL | Status: DC | PRN

## 2014-01-15 MED ORDER — ONDANSETRON HCL 8 MG PO TABS: 8.0000 mg | ORAL_TABLET | Freq: Three times a day (TID) | ORAL | Status: DC | PRN

## 2014-01-15 NOTE — Progress Notes (Signed)
Pt is here following up on the abdominal pain. Pt is needing a medication refill.

## 2014-01-15 NOTE — Patient Instructions (Signed)
Abdominal Pain, Adult °Many things can cause abdominal pain. Usually, abdominal pain is not caused by a disease and will improve without treatment. It can often be observed and treated at home. Your health care provider will do a physical exam and possibly order blood tests and X-rays to help determine the seriousness of your pain. However, in many cases, more time must pass before a clear cause of the pain can be found. Before that point, your health care provider may not know if you need more testing or further treatment. °HOME CARE INSTRUCTIONS  °Monitor your abdominal pain for any changes. The following actions may help to alleviate any discomfort you are experiencing: °· Only take over-the-counter or prescription medicines as directed by your health care provider. °· Do not take laxatives unless directed to do so by your health care provider. °· Try a clear liquid diet (broth, tea, or water) as directed by your health care provider. Slowly move to a bland diet as tolerated. °SEEK MEDICAL CARE IF: °· You have unexplained abdominal pain. °· You have abdominal pain associated with nausea or diarrhea. °· You have pain when you urinate or have a bowel movement. °· You experience abdominal pain that wakes you in the night. °· You have abdominal pain that is worsened or improved by eating food. °· You have abdominal pain that is worsened with eating fatty foods. °SEEK IMMEDIATE MEDICAL CARE IF:  °· Your pain does not go away within 2 hours. °· You have a fever. °· You keep throwing up (vomiting). °· Your pain is felt only in portions of the abdomen, such as the right side or the left lower portion of the abdomen. °· You pass bloody or black tarry stools. °MAKE SURE YOU: °· Understand these instructions.   °· Will watch your condition.   °· Will get help right away if you are not doing well or get worse.   °Document Released: 08/30/2005 Document Revised: 09/10/2013 Document Reviewed: 07/30/2013 °[employer]® Patient  Information ©2014 [employer]. ° °
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
## 2014-02-07 ENCOUNTER — Encounter (HOSPITAL_COMMUNITY): Payer: Self-pay | Admitting: Emergency Medicine

## 2014-02-07 ENCOUNTER — Emergency Department (HOSPITAL_COMMUNITY)
Admission: EM | Admit: 2014-02-07 | Discharge: 2014-02-07 | Disposition: A | Discharge status: Home / Self Care | Payer: No Typology Code available for payment source | Attending: Emergency Medicine | Admitting: Emergency Medicine

## 2014-02-07 DIAGNOSIS — R197 Diarrhea, unspecified: Secondary | ICD-10-CM | POA: Insufficient documentation

## 2014-02-07 DIAGNOSIS — G8929 Other chronic pain: Secondary | ICD-10-CM | POA: Insufficient documentation

## 2014-02-07 DIAGNOSIS — Z8719 Personal history of other diseases of the digestive system: Secondary | ICD-10-CM | POA: Insufficient documentation

## 2014-02-07 DIAGNOSIS — F17200 Nicotine dependence, unspecified, uncomplicated: Secondary | ICD-10-CM | POA: Insufficient documentation

## 2014-02-07 DIAGNOSIS — M549 Dorsalgia, unspecified: Secondary | ICD-10-CM | POA: Insufficient documentation

## 2014-02-07 DIAGNOSIS — Z791 Long term (current) use of non-steroidal anti-inflammatories (NSAID): Secondary | ICD-10-CM | POA: Insufficient documentation

## 2014-02-07 DIAGNOSIS — Z9889 Other specified postprocedural states: Secondary | ICD-10-CM | POA: Insufficient documentation

## 2014-02-07 DIAGNOSIS — Z872 Personal history of diseases of the skin and subcutaneous tissue: Secondary | ICD-10-CM | POA: Insufficient documentation

## 2014-02-07 DIAGNOSIS — R109 Unspecified abdominal pain: Secondary | ICD-10-CM | POA: Insufficient documentation

## 2014-02-07 DIAGNOSIS — Z9089 Acquired absence of other organs: Secondary | ICD-10-CM | POA: Insufficient documentation

## 2014-02-07 DIAGNOSIS — R112 Nausea with vomiting, unspecified: Secondary | ICD-10-CM | POA: Insufficient documentation

## 2014-02-07 DIAGNOSIS — R1084 Generalized abdominal pain: Secondary | ICD-10-CM | POA: Insufficient documentation

## 2014-02-07 DIAGNOSIS — Z87828 Personal history of other (healed) physical injury and trauma: Secondary | ICD-10-CM | POA: Insufficient documentation

## 2014-02-07 LAB — COMPREHENSIVE METABOLIC PANEL
ALT: 23 U/L (ref 0–53)
AST: 24 U/L (ref 0–37)
Albumin: 4 g/dL (ref 3.5–5.2)
Alkaline Phosphatase: 62 U/L (ref 39–117)
BUN: 8 mg/dL (ref 6–23)
CO2: 24 mEq/L (ref 19–32)
Calcium: 9.5 mg/dL (ref 8.4–10.5)
Chloride: 99 mEq/L (ref 96–112)
Creatinine, Ser: 1.04 mg/dL (ref 0.50–1.35)
GFR calc Af Amer: >90 mL/min (ref >90)
GFR calc non Af Amer: 82 mL/min — ABNORMAL LOW (ref >90)
Glucose, Bld: 99 mg/dL (ref 70–99)
Potassium: 3.9 mEq/L (ref 3.7–5.3)
Sodium: 137 mEq/L (ref 137–147)
Total Bilirubin: 0.3 mg/dL (ref 0.3–1.2)
Total Protein: 7.5 g/dL (ref 6.0–8.3)

## 2014-02-07 LAB — URINALYSIS, ROUTINE W REFLEX MICROSCOPIC
Glucose, UA: NEGATIVE mg/dL
Hgb urine dipstick: NEGATIVE
Ketones, ur: 15 mg/dL — AB
Nitrite: NEGATIVE
Protein, ur: 30 mg/dL — AB
Specific Gravity, Urine: 1.04 — ABNORMAL HIGH (ref 1.005–1.030)
Urobilinogen, UA: 1 mg/dL (ref 0.0–1.0)
pH: 5.5 (ref 5.0–8.0)

## 2014-02-07 LAB — URINE MICROSCOPIC-ADD ON

## 2014-02-07 LAB — CBC WITH DIFFERENTIAL/PLATELET
Basophils Absolute: 0 10*3/uL (ref 0.0–0.1)
Basophils Relative: 0 % (ref 0–1)
Eosinophils Absolute: 0.1 10*3/uL (ref 0.0–0.7)
Eosinophils Relative: 1 % (ref 0–5)
HCT: 41.9 % (ref 39.0–52.0)
Hemoglobin: 14.8 g/dL (ref 13.0–17.0)
Lymphocytes Relative: 43 % (ref 12–46)
Lymphs Abs: 3.6 10*3/uL (ref 0.7–4.0)
MCH: 31.9 pg (ref 26.0–34.0)
MCHC: 35.3 g/dL (ref 30.0–36.0)
MCV: 90.3 fL (ref 78.0–100.0)
Monocytes Absolute: 0.6 10*3/uL (ref 0.1–1.0)
Monocytes Relative: 7 % (ref 3–12)
Neutro Abs: 4.2 10*3/uL (ref 1.7–7.7)
Neutrophils Relative %: 50 % (ref 43–77)
Platelets: 206 10*3/uL (ref 150–400)
RBC: 4.64 MIL/uL (ref 4.22–5.81)
RDW: 13.3 % (ref 11.5–15.5)
WBC: 8.4 10*3/uL (ref 4.0–10.5)

## 2014-02-07 LAB — LIPASE, BLOOD: Lipase: 49 U/L (ref 11–59)

## 2014-02-07 MED ORDER — METOCLOPRAMIDE HCL 5 MG/ML IJ SOLN
10.0000 mg | Freq: Once | INTRAMUSCULAR | Status: AC
  Administered 2014-02-07: 10 mg via INTRAVENOUS
  Filled 2014-02-07: qty 2

## 2014-02-07 MED ORDER — NAPROXEN 500 MG PO TABS: 500.0000 mg | ORAL_TABLET | Freq: Two times a day (BID) | ORAL | Status: DC

## 2014-02-07 MED ORDER — KETOROLAC TROMETHAMINE 30 MG/ML IJ SOLN
30.0000 mg | Freq: Once | INTRAMUSCULAR | Status: AC
  Administered 2014-02-07: 30 mg via INTRAVENOUS
  Filled 2014-02-07: qty 1

## 2014-02-07 MED ORDER — DICYCLOMINE HCL 10 MG/ML IM SOLN
20.0000 mg | Freq: Once | INTRAMUSCULAR | Status: AC
  Administered 2014-02-07: 20 mg via INTRAMUSCULAR
  Filled 2014-02-07: qty 2

## 2014-02-07 MED ORDER — METOCLOPRAMIDE HCL 10 MG PO TABS: 10.0000 mg | ORAL_TABLET | Freq: Once | ORAL | Status: DC

## 2014-02-07 MED ORDER — NAPROXEN 250 MG PO TABS
500.0000 mg | ORAL_TABLET | Freq: Once | ORAL | Status: AC
  Administered 2014-02-07: 500 mg via ORAL
  Filled 2014-02-07: qty 2
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
## 2014-02-07 NOTE — ED Notes (Signed)
Pt reports he was here last night for abd pain and his stomach still hurts.

## 2014-02-07 NOTE — ED Provider Notes (Signed)
Medical screening examination/treatment/procedure(s) were performed by non-physician practitioner and as supervising physician I was immediately available for consultation/collaboration.   EKG Interpretation None        Mariea Clonts, MD 02/07/14 [phone]

## 2014-02-07 NOTE — Discharge Instructions (Signed)
Chronic Pain Chronic pain can be defined as pain that is off and on and lasts for 3 6 months or longer. Many things cause chronic pain, which can make it difficult to make a diagnosis. There are many treatment options available for chronic pain. However, finding a treatment that works well for you may require trying various approaches until the right one is found. Many people benefit from a combination of two or more types of treatment to control their pain. SYMPTOMS  Chronic pain can occur anywhere in the body and can range from mild to very severe. Some types of chronic pain include:  Headache.  Low back pain.  Cancer pain.  Arthritis pain.  Neurogenic pain. This is pain resulting from damage to nerves. People with chronic pain may also have other symptoms such as:  Depression.  Anger.  Insomnia.  Anxiety. DIAGNOSIS  Your health care provider will help diagnose your condition over time. In many cases, the initial focus will be on excluding possible conditions that could be causing the pain. Depending on your symptoms, your health care provider may order tests to diagnose your condition. Some of these tests may include:   Blood tests.   CT scan.   MRI.   X-rays.   Ultrasounds.   Nerve conduction studies.  You may need to see a specialist.  TREATMENT  Finding treatment that works well may take time. You may be referred to a pain specialist. He or she may prescribe medicine or therapies, such as:   Mindful meditation or yoga.  Shots (injections) of numbing or pain-relieving medicines into the spine or area of pain.  Local electrical stimulation.  Acupuncture.   Massage therapy.   Aroma, color, light, or sound therapy.   Biofeedback.   Working with a physical therapist to keep from getting stiff.   Regular, gentle exercise.   Cognitive or behavioral therapy.   Group support.  Sometimes, surgery may be recommended.  HOME CARE INSTRUCTIONS    Take all medicines as directed by your health care provider.   Lessen stress in your life by relaxing and doing things such as listening to calming music.   Exercise or be active as directed by your health care provider.   Eat a healthy diet and include things such as vegetables, fruits, fish, and lean meats in your diet.   Keep all follow-up appointments with your health care provider.   Attend a support group with others suffering from chronic pain. SEEK MEDICAL CARE IF:   Your pain gets worse.   You develop a new pain that was not there before.   You cannot tolerate medicines given to you by your health care provider.   You have new symptoms since your last visit with your health care provider.  SEEK IMMEDIATE MEDICAL CARE IF:   You feel weak.   You have decreased sensation or numbness.   You lose control of bowel or bladder function.   Your pain suddenly gets much worse.   You develop shaking.  You develop chills.  You develop confusion.  You develop chest pain.  You develop shortness of breath.  MAKE SURE YOU:  Understand these instructions.  Will watch your condition.  Will get help right away if you are not doing well or get worse. Document Released: 08/12/2002 Document Revised: 07/23/2013 Document Reviewed: 05/16/2013 [facility] Patient Information 2014 [place].  Flank Pain Flank pain refers to pain that is located on the side of the body between the upper  abdomen and the back. The pain may occur over a short period of time (acute) or may be long-term or reoccurring (chronic). It may be mild or severe. Flank pain can be caused by many things. CAUSES  Some of the more common causes of flank pain include:  Muscle strains.   Muscle spasms.   A disease of your spine (vertebral disk disease).   A lung infection (pneumonia).   Fluid around your lungs (pulmonary edema).   A kidney infection.   Kidney stones.   A very  painful skin rash caused by the chickenpox virus (shingles).   Gallbladder disease.  [place] care will depend on the cause of your pain. In general,  Rest as directed by your caregiver.  Drink enough fluids to keep your urine clear or pale yellow.  Only take over-the-counter or prescription medicines as directed by your caregiver. Some medicines may help relieve the pain.  Tell your caregiver about any changes in your pain.  Follow up with your caregiver as directed. SEEK IMMEDIATE MEDICAL CARE IF:   Your pain is not controlled with medicine.   You have new or worsening symptoms.  Your pain increases.   You have abdominal pain.   You have shortness of breath.   You have persistent nausea or vomiting.   You have swelling in your abdomen.   You feel faint or pass out.   You have blood in your urine.  You have a fever or persistent symptoms for more than 2 3 days.  You have a fever and your symptoms suddenly get worse. MAKE SURE YOU:   Understand these instructions.  Will watch your condition.  Will get help right away if you are not doing well or get worse. Document Released: 01/11/2006 Document Revised: 08/14/2012 Document Reviewed: 07/04/2012 [facility] Patient Information 2014 [place].

## 2014-02-07 NOTE — Discharge Instructions (Signed)
Chronic Pain Chronic pain can be defined as pain that is off and on and lasts for 3 6 months or longer. Many things cause chronic pain, which can make it difficult to make a diagnosis. There are many treatment options available for chronic pain. However, finding a treatment that works well for you may require trying various approaches until the right one is found. Many people benefit from a combination of two or more types of treatment to control their pain. SYMPTOMS  Chronic pain can occur anywhere in the body and can range from mild to very severe. Some types of chronic pain include:  Headache.  Low back pain.  Cancer pain.  Arthritis pain.  Neurogenic pain. This is pain resulting from damage to nerves. People with chronic pain may also have other symptoms such as:  Depression.  Anger.  Insomnia.  Anxiety. DIAGNOSIS  Your health care provider will help diagnose your condition over time. In many cases, the initial focus will be on excluding possible conditions that could be causing the pain. Depending on your symptoms, your health care provider may order tests to diagnose your condition. Some of these tests may include:   Blood tests.   CT scan.   MRI.   X-rays.   Ultrasounds.   Nerve conduction studies.  You may need to see a specialist.  TREATMENT  Finding treatment that works well may take time. You may be referred to a pain specialist. He or she may prescribe medicine or therapies, such as:   Mindful meditation or yoga.  Shots (injections) of numbing or pain-relieving medicines into the spine or area of pain.  Local electrical stimulation.  Acupuncture.   Massage therapy.   Aroma, color, light, or sound therapy.   Biofeedback.   Working with a physical therapist to keep from getting stiff.   Regular, gentle exercise.   Cognitive or behavioral therapy.   Group support.  Sometimes, surgery may be recommended.  HOME CARE INSTRUCTIONS    Take all medicines as directed by your health care provider.   Lessen stress in your life by relaxing and doing things such as listening to calming music.   Exercise or be active as directed by your health care provider.   Eat a healthy diet and include things such as vegetables, fruits, fish, and lean meats in your diet.   Keep all follow-up appointments with your health care provider.   Attend a support group with others suffering from chronic pain. SEEK MEDICAL CARE IF:   Your pain gets worse.   You develop a new pain that was not there before.   You cannot tolerate medicines given to you by your health care provider.   You have new symptoms since your last visit with your health care provider.  SEEK IMMEDIATE MEDICAL CARE IF:   You feel weak.   You have decreased sensation or numbness.   You lose control of bowel or bladder function.   Your pain suddenly gets much worse.   You develop shaking.  You develop chills.  You develop confusion.  You develop chest pain.  You develop shortness of breath.  MAKE SURE YOU:  Understand these instructions.  Will watch your condition.  Will get help right away if you are not doing well or get worse. Document Released: 08/12/2002 Document Revised: 07/23/2013 Document Reviewed: 05/16/2013 [facility] Patient Information 2014 [place].  Abdominal Pain, Adult Many things can cause belly (abdominal) pain. Most times, the belly pain is not dangerous. Many  cases of belly pain can be watched and treated at home. HOME CARE   Do not take medicines that help you go poop (laxatives) unless told to by your doctor.  Only take medicine as told by your doctor.  Eat or drink as told by your doctor. Your doctor will tell you if you should be on a special diet. GET HELP IF:  You do not know what is causing your belly pain.  You have belly pain while you are sick to your stomach (nauseous) or have runny poop  (diarrhea).  You have pain while you pee or poop.  Your belly pain wakes you up at night.  You have belly pain that gets worse or better when you eat.  You have belly pain that gets worse when you eat fatty foods. GET HELP RIGHT AWAY IF:   The pain does not go away within 2 hours.  You have a fever.  You keep throwing up (vomiting).  The pain changes and is only in the right or left part of the belly.  You have bloody or tarry looking poop. MAKE SURE YOU:   Understand these instructions.  Will watch your condition.  Will get help right away if you are not doing well or get worse. Document Released: 05/08/2008 Document Revised: 09/10/2013 Document Reviewed: 07/30/2013 [facility] Patient Information 2014 [place].
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
## 2014-02-07 NOTE — ED Notes (Signed)
The pt is c/o abd pain for 2-3 days with nv and d

## 2014-02-07 NOTE — ED Notes (Signed)
Pt report of n/v/d. None observed since being in room.

## 2014-02-07 NOTE — ED Notes (Signed)
Gave pt [place] sandwich and sprite

## 2014-02-08 ENCOUNTER — Encounter (HOSPITAL_COMMUNITY): Payer: Self-pay | Admitting: Emergency Medicine

## 2014-02-08 ENCOUNTER — Emergency Department (HOSPITAL_COMMUNITY)
Admission: EM | Admit: 2014-02-08 | Discharge: 2014-02-08 | Disposition: A | Discharge status: Home / Self Care | Payer: No Typology Code available for payment source | Attending: Emergency Medicine | Admitting: Emergency Medicine

## 2014-02-08 DIAGNOSIS — R112 Nausea with vomiting, unspecified: Secondary | ICD-10-CM

## 2014-02-08 DIAGNOSIS — Z8719 Personal history of other diseases of the digestive system: Secondary | ICD-10-CM | POA: Insufficient documentation

## 2014-02-08 DIAGNOSIS — R109 Unspecified abdominal pain: Secondary | ICD-10-CM

## 2014-02-08 DIAGNOSIS — F17200 Nicotine dependence, unspecified, uncomplicated: Secondary | ICD-10-CM | POA: Insufficient documentation

## 2014-02-08 DIAGNOSIS — Z8739 Personal history of other diseases of the musculoskeletal system and connective tissue: Secondary | ICD-10-CM | POA: Insufficient documentation

## 2014-02-08 DIAGNOSIS — Z9889 Other specified postprocedural states: Secondary | ICD-10-CM | POA: Insufficient documentation

## 2014-02-08 DIAGNOSIS — Z9089 Acquired absence of other organs: Secondary | ICD-10-CM | POA: Insufficient documentation

## 2014-02-08 DIAGNOSIS — Z872 Personal history of diseases of the skin and subcutaneous tissue: Secondary | ICD-10-CM | POA: Insufficient documentation

## 2014-02-08 DIAGNOSIS — Z87828 Personal history of other (healed) physical injury and trauma: Secondary | ICD-10-CM | POA: Insufficient documentation

## 2014-02-08 DIAGNOSIS — G8929 Other chronic pain: Secondary | ICD-10-CM | POA: Insufficient documentation

## 2014-02-08 DIAGNOSIS — R1084 Generalized abdominal pain: Secondary | ICD-10-CM | POA: Insufficient documentation

## 2014-02-08 LAB — CBC WITH DIFFERENTIAL/PLATELET
Basophils Absolute: 0 10*3/uL (ref 0.0–0.1)
Basophils Relative: 0 % (ref 0–1)
Eosinophils Absolute: 0 10*3/uL (ref 0.0–0.7)
Eosinophils Relative: 0 % (ref 0–5)
HCT: 40.6 % (ref 39.0–52.0)
Hemoglobin: 14.8 g/dL (ref 13.0–17.0)
Lymphocytes Relative: 20 % (ref 12–46)
Lymphs Abs: 1.9 10*3/uL (ref 0.7–4.0)
MCH: 32.7 pg (ref 26.0–34.0)
MCHC: 36.5 g/dL — ABNORMAL HIGH (ref 30.0–36.0)
MCV: 89.8 fL (ref 78.0–100.0)
Monocytes Absolute: 0.7 10*3/uL (ref 0.1–1.0)
Monocytes Relative: 7 % (ref 3–12)
Neutro Abs: 6.8 10*3/uL (ref 1.7–7.7)
Neutrophils Relative %: 73 % (ref 43–77)
Platelets: 216 10*3/uL (ref 150–400)
RBC: 4.52 MIL/uL (ref 4.22–5.81)
RDW: 13 % (ref 11.5–15.5)
WBC: 9.4 10*3/uL (ref 4.0–10.5)

## 2014-02-08 LAB — URINALYSIS, ROUTINE W REFLEX MICROSCOPIC
Glucose, UA: NEGATIVE mg/dL
Hgb urine dipstick: NEGATIVE
Ketones, ur: 15 mg/dL — AB
Leukocytes, UA: NEGATIVE
Nitrite: POSITIVE — AB
Protein, ur: 30 mg/dL — AB
Specific Gravity, Urine: 1.035 — ABNORMAL HIGH (ref 1.005–1.030)
Urobilinogen, UA: 1 mg/dL (ref 0.0–1.0)
pH: 5.5 (ref 5.0–8.0)

## 2014-02-08 LAB — COMPREHENSIVE METABOLIC PANEL
ALT: 27 U/L (ref 0–53)
AST: 51 U/L — ABNORMAL HIGH (ref 0–37)
Albumin: 4.3 g/dL (ref 3.5–5.2)
Alkaline Phosphatase: 52 U/L (ref 39–117)
BUN: 12 mg/dL (ref 6–23)
CO2: 25 mEq/L (ref 19–32)
Calcium: 9.7 mg/dL (ref 8.4–10.5)
Chloride: 97 mEq/L (ref 96–112)
Creatinine, Ser: 1.28 mg/dL (ref 0.50–1.35)
GFR calc Af Amer: 74 mL/min — ABNORMAL LOW (ref >90)
GFR calc non Af Amer: 64 mL/min — ABNORMAL LOW (ref >90)
Glucose, Bld: 104 mg/dL — ABNORMAL HIGH (ref 70–99)
Potassium: 4.8 mEq/L (ref 3.7–5.3)
Sodium: 139 mEq/L (ref 137–147)
Total Bilirubin: 0.6 mg/dL (ref 0.3–1.2)
Total Protein: 7.9 g/dL (ref 6.0–8.3)

## 2014-02-08 LAB — URINE MICROSCOPIC-ADD ON

## 2014-02-08 MED ORDER — DICYCLOMINE HCL 10 MG/ML IM SOLN
20.0000 mg | Freq: Once | INTRAMUSCULAR | Status: AC
  Administered 2014-02-08: 20 mg via INTRAMUSCULAR
  Filled 2014-02-08: qty 2

## 2014-02-08 MED ORDER — SODIUM CHLORIDE 0.9 % IV SOLN
1000.0000 mL | Freq: Once | INTRAVENOUS | Status: AC
  Administered 2014-02-08: 1000 mL via INTRAVENOUS

## 2014-02-08 MED ORDER — SODIUM CHLORIDE 0.9 % IV BOLUS (SEPSIS): 1000.0000 mL | Freq: Once | INTRAVENOUS | Status: DC

## 2014-02-08 MED ORDER — HYDROMORPHONE HCL PF 1 MG/ML IJ SOLN
1.0000 mg | Freq: Once | INTRAMUSCULAR | Status: AC
  Administered 2014-02-08: 1 mg via INTRAVENOUS
  Filled 2014-02-08: qty 1

## 2014-02-08 MED ORDER — ONDANSETRON HCL 4 MG/2ML IJ SOLN
4.0000 mg | Freq: Once | INTRAMUSCULAR | Status: AC
  Administered 2014-02-08: 4 mg via INTRAVENOUS
  Filled 2014-02-08: qty 2

## 2014-02-08 NOTE — ED Notes (Signed)
Pt states that he was seen here for the past 2 days. to states he received several prescriptions medications for pain that are not working. Pt states that lower abdominal pain.

## 2014-02-08 NOTE — ED Notes (Signed)
Pt given a urinal and encouraged to give urine sample.

## 2014-02-08 NOTE — ED Notes (Signed)
Walked in to pt room to administer medications.  Pt is laying horizontal in the bed with his feet through the side rails and his head on the stool at the computer.  Pt advised that is behavior was not appropriate and to correct himself, tech Herbie [place]) at bedside.  Pt is defensive with his attitude and rude to staff making slurring comments referring to the way he was lying.  Patient states "you can't tell me how I can lay in the bed, once you leave I am going to go back to the same position".  Pt advised of the safety risk he was placing himself in.

## 2014-02-08 NOTE — ED Notes (Signed)
Pt in room sleeping prior to discharge instructions.  Pt during instructions was alert and oriented and verbalized understanding for follow up care.  Pt was ambulatory and talkative at discharge rating the pain 4/10.  Given cranberry juice and sprite at d/c.

## 2014-02-08 NOTE — Discharge Instructions (Signed)
Abdominal Pain, Adult °Many things can cause abdominal pain. Usually, abdominal pain is not caused by a disease and will improve without treatment. It can often be observed and treated at home. Your health care provider will do a physical exam and possibly order blood tests and X-rays to help determine the seriousness of your pain. However, in many cases, more time must pass before a clear cause of the pain can be found. Before that point, your health care provider may not know if you need more testing or further treatment. °HOME CARE INSTRUCTIONS  °Monitor your abdominal pain for any changes. The following actions may help to alleviate any discomfort you are experiencing: °· Only take over-the-counter or prescription medicines as directed by your health care provider. °· Do not take laxatives unless directed to do so by your health care provider. °· Try a clear liquid diet (broth, tea, or water) as directed by your health care provider. Slowly move to a bland diet as tolerated. °SEEK MEDICAL CARE IF: °· You have unexplained abdominal pain. °· You have abdominal pain associated with nausea or diarrhea. °· You have pain when you urinate or have a bowel movement. °· You experience abdominal pain that wakes you in the night. °· You have abdominal pain that is worsened or improved by eating food. °· You have abdominal pain that is worsened with eating fatty foods. °SEEK IMMEDIATE MEDICAL CARE IF:  °· Your pain does not go away within 2 hours. °· You have a fever. °· You keep throwing up (vomiting). °· Your pain is felt only in portions of the abdomen, such as the right side or the left lower portion of the abdomen. °· You pass bloody or black tarry stools. °MAKE SURE YOU: °· Understand these instructions.   °· Will watch your condition.   °· Will get help right away if you are not doing well or get worse.   °Document Released: 08/30/2005 Document Revised: 09/10/2013 Document Reviewed: 07/30/2013 °[employer]® Patient  Information ©2014 [employer]. ° °
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
## 2014-02-23 ENCOUNTER — Emergency Department (HOSPITAL_COMMUNITY)
Admission: EM | Admit: 2014-02-23 | Discharge: 2014-02-23 | Disposition: A | Discharge status: Home / Self Care | Payer: No Typology Code available for payment source | Attending: Emergency Medicine | Admitting: Emergency Medicine

## 2014-02-23 ENCOUNTER — Encounter (HOSPITAL_COMMUNITY): Payer: Self-pay | Admitting: Emergency Medicine

## 2014-02-23 ENCOUNTER — Telehealth: Payer: Self-pay | Admitting: Internal Medicine

## 2014-02-23 DIAGNOSIS — R109 Unspecified abdominal pain: Secondary | ICD-10-CM

## 2014-02-23 DIAGNOSIS — F17200 Nicotine dependence, unspecified, uncomplicated: Secondary | ICD-10-CM | POA: Insufficient documentation

## 2014-02-23 DIAGNOSIS — Z872 Personal history of diseases of the skin and subcutaneous tissue: Secondary | ICD-10-CM | POA: Insufficient documentation

## 2014-02-23 DIAGNOSIS — R1110 Vomiting, unspecified: Secondary | ICD-10-CM | POA: Insufficient documentation

## 2014-02-23 DIAGNOSIS — Z9089 Acquired absence of other organs: Secondary | ICD-10-CM | POA: Insufficient documentation

## 2014-02-23 DIAGNOSIS — R1012 Left upper quadrant pain: Secondary | ICD-10-CM | POA: Insufficient documentation

## 2014-02-23 DIAGNOSIS — Z8719 Personal history of other diseases of the digestive system: Secondary | ICD-10-CM | POA: Insufficient documentation

## 2014-02-23 DIAGNOSIS — R1013 Epigastric pain: Secondary | ICD-10-CM | POA: Insufficient documentation

## 2014-02-23 DIAGNOSIS — Z87828 Personal history of other (healed) physical injury and trauma: Secondary | ICD-10-CM | POA: Insufficient documentation

## 2014-02-23 DIAGNOSIS — G8929 Other chronic pain: Secondary | ICD-10-CM | POA: Insufficient documentation

## 2014-02-23 DIAGNOSIS — R634 Abnormal weight loss: Secondary | ICD-10-CM | POA: Insufficient documentation

## 2014-02-23 DIAGNOSIS — Z79899 Other long term (current) drug therapy: Secondary | ICD-10-CM | POA: Insufficient documentation

## 2014-02-23 DIAGNOSIS — R1011 Right upper quadrant pain: Secondary | ICD-10-CM | POA: Insufficient documentation

## 2014-02-23 DIAGNOSIS — Z9889 Other specified postprocedural states: Secondary | ICD-10-CM | POA: Insufficient documentation

## 2014-02-23 LAB — URINALYSIS, ROUTINE W REFLEX MICROSCOPIC
Bilirubin Urine: NEGATIVE
Glucose, UA: NEGATIVE mg/dL
Hgb urine dipstick: NEGATIVE
Ketones, ur: 15 mg/dL — AB
Leukocytes, UA: NEGATIVE
Nitrite: NEGATIVE
Protein, ur: NEGATIVE mg/dL
Specific Gravity, Urine: 1.036 — ABNORMAL HIGH (ref 1.005–1.030)
Urobilinogen, UA: 0.2 mg/dL (ref 0.0–1.0)
pH: 5 (ref 5.0–8.0)

## 2014-02-23 LAB — CBC WITH DIFFERENTIAL/PLATELET
Basophils Absolute: 0 10*3/uL (ref 0.0–0.1)
Basophils Relative: 0 % (ref 0–1)
Eosinophils Absolute: 0.1 10*3/uL (ref 0.0–0.7)
Eosinophils Relative: 2 % (ref 0–5)
HCT: 42.1 % (ref 39.0–52.0)
Hemoglobin: 15.1 g/dL (ref 13.0–17.0)
Lymphocytes Relative: 39 % (ref 12–46)
Lymphs Abs: 2.9 10*3/uL (ref 0.7–4.0)
MCH: 32.4 pg (ref 26.0–34.0)
MCHC: 35.9 g/dL (ref 30.0–36.0)
MCV: 90.3 fL (ref 78.0–100.0)
Monocytes Absolute: 0.6 10*3/uL (ref 0.1–1.0)
Monocytes Relative: 8 % (ref 3–12)
Neutro Abs: 4 10*3/uL (ref 1.7–7.7)
Neutrophils Relative %: 52 % (ref 43–77)
Platelets: 209 10*3/uL (ref 150–400)
RBC: 4.66 MIL/uL (ref 4.22–5.81)
RDW: 13.5 % (ref 11.5–15.5)
WBC: 7.6 10*3/uL (ref 4.0–10.5)

## 2014-02-23 LAB — COMPREHENSIVE METABOLIC PANEL
ALT: 29 U/L (ref 0–53)
AST: 23 U/L (ref 0–37)
Albumin: 3.9 g/dL (ref 3.5–5.2)
Alkaline Phosphatase: 61 U/L (ref 39–117)
BUN: 8 mg/dL (ref 6–23)
CO2: 26 mEq/L (ref 19–32)
Calcium: 9.7 mg/dL (ref 8.4–10.5)
Chloride: 102 mEq/L (ref 96–112)
Creatinine, Ser: 0.83 mg/dL (ref 0.50–1.35)
GFR calc Af Amer: >90 mL/min (ref >90)
GFR calc non Af Amer: >90 mL/min (ref >90)
Glucose, Bld: 94 mg/dL (ref 70–99)
Potassium: 4.1 mEq/L (ref 3.7–5.3)
Sodium: 138 mEq/L (ref 137–147)
Total Bilirubin: 0.4 mg/dL (ref 0.3–1.2)
Total Protein: 7.4 g/dL (ref 6.0–8.3)

## 2014-02-23 LAB — LIPASE, BLOOD: Lipase: 32 U/L (ref 11–59)

## 2014-02-23 MED ORDER — ONDANSETRON HCL 8 MG PO TABS: 8.0000 mg | ORAL_TABLET | Freq: Three times a day (TID) | ORAL | Status: DC | PRN

## 2014-02-23 MED ORDER — DICYCLOMINE HCL 10 MG PO CAPS
10.0000 mg | ORAL_CAPSULE | Freq: Once | ORAL | Status: AC
  Administered 2014-02-23: 10 mg via ORAL
  Filled 2014-02-23: qty 1

## 2014-02-23 MED ORDER — DICYCLOMINE HCL 20 MG PO TABS: 20.0000 mg | ORAL_TABLET | Freq: Three times a day (TID) | ORAL | Status: DC

## 2014-02-23 MED ORDER — ONDANSETRON 4 MG PO TBDP
8.0000 mg | ORAL_TABLET | Freq: Once | ORAL | Status: AC
  Administered 2014-02-23: 8 mg via ORAL
  Filled 2014-02-23: qty 2

## 2014-02-23 NOTE — ED Notes (Signed)
Pt left before signing out; staff looked for pt in bathrooms and visitor areas without finding patient

## 2014-02-23 NOTE — Telephone Encounter (Signed)
Urgent: Dr Stevie Kern calling to discuss medications for pt, pt will be discharged today.  Also would like care plan for pt.

## 2014-02-23 NOTE — Telephone Encounter (Signed)
Dr Stevie Kern called in regards to care plan for pt. States pt came in ER requesting pain medication and being very disruptive and loud. Please f/u no emergent care plan for this pt and discuss with Dr. Stevie Kern

## 2014-02-23 NOTE — ED Notes (Signed)
Dr. Stevie Kern in to assess pt and speak with pt regarding discharge.

## 2014-02-23 NOTE — Discharge Instructions (Signed)
Abdominal (belly) pain can be caused by many things. Your caregiver performed an examination and possibly ordered blood/urine tests and imaging (CT scan, x-rays, ultrasound). Many cases can be observed and treated at home after initial evaluation in the emergency department. Even though you are being discharged home, abdominal pain can be unpredictable. Therefore, you need a repeated exam if your pain does not resolve, returns, or worsens. Most patients with abdominal pain don't have to be admitted to the hospital or have surgery, but serious problems like appendicitis and gallbladder attacks can start out as nonspecific pain. Many abdominal conditions cannot be diagnosed in one visit, so follow-up evaluations are very important. °SEEK IMMEDIATE MEDICAL ATTENTION IF: °The pain does not go away or becomes severe.  °A temperature above 101 develops.  °Repeated vomiting occurs (multiple episodes).  °The pain becomes localized to portions of the abdomen. The right side could possibly be appendicitis. In an adult, the left lower portion of the abdomen could be colitis or diverticulitis.  °Blood is being passed in stools or vomit (bright red or black tarry stools).  °Return also if you develop chest pain, difficulty breathing, dizziness or fainting, or become confused, poorly responsive, or inconsolable (young children). ° °Chronic Pain Discharge Instructions  °Emergency care providers appreciate that many patients coming to us are in severe pain and we wish to address their pain in the safest, most responsible manner.  It is important to recognize however, that the proper treatment of chronic pain differs from that of the pain of injuries and acute illnesses.  Our goal is to provide quality, safe, personalized care and we thank you for giving us the opportunity to serve you. °The use of narcotics and related agents for chronic pain syndromes may lead to additional physical and psychological problems.  Nearly as many  people die from prescription narcotics each year as die from car crashes.  Additionally, this risk is increased if such prescriptions are obtained from a variety of sources.  Therefore, only your primary care physician or a pain management specialist is able to safely treat such syndromes with narcotic medications long-term.   ° °Documentation revealing such prescriptions have been sought from multiple sources may prohibit us from providing a refill or different narcotic medication.  Your name may be checked first through the [place] Controlled Substances Reporting System.  This database is a record of controlled substance medication prescriptions that the patient has received.  This has been established by [place] in an effort to eliminate the dangerous, and often life threatening, practice of obtaining multiple prescriptions from different medical providers.  ° °If you have a chronic pain syndrome (i.e. chronic headaches, recurrent back or neck pain, dental pain, abdominal or pelvis pain without a specific diagnosis, or neuropathic pain such as fibromyalgia) or recurrent visits for the same condition without an acute diagnosis, you may be treated with non-narcotics and other non-addictive medicines.  Allergic reactions or negative side effects that may be reported by a patient to such medications will not typically lead to the use of a narcotic analgesic or other controlled substance as an alternative. °  °Patients managing chronic pain with a personal physician should have provisions in place for breakthrough pain.  If you are in crisis, you should call your physician.  If your physician directs you to the emergency department, please have the doctor call and speak to our attending physician concerning your care. °  °When patients come to the Emergency Department (ED) with acute   medical conditions in which the Emergency Department physician feels appropriate to prescribe narcotic or sedating pain  medication, the physician will prescribe these in very limited quantities.  The amount of these medications will last only until you can see your primary care physician in his/her office.  Any patient who returns to the ED seeking refills should expect only non-narcotic pain medications.  ° °In the event of an acute medical condition exists and the emergency physician feels it is necessary that the patient be given a narcotic or sedating medication -  a responsible adult driver should be present in the room prior to the medication being given by the nurse. °  °Prescriptions for narcotic or sedating medications that have been lost, stolen or expired will not be refilled in the Emergency Department.   ° °Patients who have chronic pain may receive non-narcotic prescriptions until seen by their primary care physician.  It is every patient’s personal responsibility to maintain active prescriptions with his or her primary care physician or specialist. °
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
## 2014-02-23 NOTE — ED Notes (Signed)
To hospital for eval of right lower quad pain, seems chronic. Pt had one episode of vomiting at home, none at present. Pt moaning and rolling around stretcher

## 2014-02-23 NOTE — Discharge Planning (Signed)
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 Patient was and has been educated on the importance of following up with his PCP. Patient states he does have an upcoming appointment with his with the clinic. My contact information was given for future questions or concerns.

## 2014-02-25 ENCOUNTER — Emergency Department (HOSPITAL_COMMUNITY)
Admission: EM | Admit: 2014-02-25 | Discharge: 2014-02-25 | Disposition: A | Discharge status: Home / Self Care | Payer: No Typology Code available for payment source | Attending: Emergency Medicine | Admitting: Emergency Medicine

## 2014-02-25 ENCOUNTER — Encounter (HOSPITAL_COMMUNITY): Payer: Self-pay | Admitting: Emergency Medicine

## 2014-02-25 DIAGNOSIS — F17200 Nicotine dependence, unspecified, uncomplicated: Secondary | ICD-10-CM | POA: Insufficient documentation

## 2014-02-25 DIAGNOSIS — Z79899 Other long term (current) drug therapy: Secondary | ICD-10-CM | POA: Insufficient documentation

## 2014-02-25 DIAGNOSIS — Z872 Personal history of diseases of the skin and subcutaneous tissue: Secondary | ICD-10-CM | POA: Insufficient documentation

## 2014-02-25 DIAGNOSIS — R143 Flatulence: Secondary | ICD-10-CM

## 2014-02-25 DIAGNOSIS — R109 Unspecified abdominal pain: Secondary | ICD-10-CM

## 2014-02-25 DIAGNOSIS — Z8719 Personal history of other diseases of the digestive system: Secondary | ICD-10-CM | POA: Insufficient documentation

## 2014-02-25 DIAGNOSIS — Z9889 Other specified postprocedural states: Secondary | ICD-10-CM | POA: Insufficient documentation

## 2014-02-25 DIAGNOSIS — R141 Gas pain: Secondary | ICD-10-CM | POA: Insufficient documentation

## 2014-02-25 DIAGNOSIS — R1084 Generalized abdominal pain: Secondary | ICD-10-CM | POA: Insufficient documentation

## 2014-02-25 DIAGNOSIS — R142 Eructation: Secondary | ICD-10-CM | POA: Insufficient documentation

## 2014-02-25 DIAGNOSIS — Z9089 Acquired absence of other organs: Secondary | ICD-10-CM | POA: Insufficient documentation

## 2014-02-25 DIAGNOSIS — G8921 Chronic pain due to trauma: Secondary | ICD-10-CM | POA: Insufficient documentation

## 2014-02-25 DIAGNOSIS — R634 Abnormal weight loss: Secondary | ICD-10-CM | POA: Insufficient documentation

## 2014-02-25 LAB — COMPREHENSIVE METABOLIC PANEL
ALT: 27 U/L (ref 0–53)
AST: 25 U/L (ref 0–37)
Albumin: 3.8 g/dL (ref 3.5–5.2)
Alkaline Phosphatase: 59 U/L (ref 39–117)
BUN: 10 mg/dL (ref 6–23)
CO2: 25 mEq/L (ref 19–32)
Calcium: 9.8 mg/dL (ref 8.4–10.5)
Chloride: 101 mEq/L (ref 96–112)
Creatinine, Ser: 0.83 mg/dL (ref 0.50–1.35)
GFR calc Af Amer: >90 mL/min (ref >90)
GFR calc non Af Amer: >90 mL/min (ref >90)
Glucose, Bld: 92 mg/dL (ref 70–99)
Potassium: 4.6 mEq/L (ref 3.7–5.3)
Sodium: 139 mEq/L (ref 137–147)
Total Bilirubin: 0.4 mg/dL (ref 0.3–1.2)
Total Protein: 7.6 g/dL (ref 6.0–8.3)

## 2014-02-25 LAB — CBC WITH DIFFERENTIAL/PLATELET
Basophils Absolute: 0 10*3/uL (ref 0.0–0.1)
Basophils Relative: 0 % (ref 0–1)
Eosinophils Absolute: 0.2 10*3/uL (ref 0.0–0.7)
Eosinophils Relative: 2 % (ref 0–5)
HCT: 42.8 % (ref 39.0–52.0)
Hemoglobin: 15.4 g/dL (ref 13.0–17.0)
Lymphocytes Relative: 31 % (ref 12–46)
Lymphs Abs: 2.8 10*3/uL (ref 0.7–4.0)
MCH: 32.6 pg (ref 26.0–34.0)
MCHC: 36 g/dL (ref 30.0–36.0)
MCV: 90.5 fL (ref 78.0–100.0)
Monocytes Absolute: 0.9 10*3/uL (ref 0.1–1.0)
Monocytes Relative: 10 % (ref 3–12)
Neutro Abs: 5 10*3/uL (ref 1.7–7.7)
Neutrophils Relative %: 57 % (ref 43–77)
Platelets: 217 10*3/uL (ref 150–400)
RBC: 4.73 MIL/uL (ref 4.22–5.81)
RDW: 13.2 % (ref 11.5–15.5)
WBC: 8.8 10*3/uL (ref 4.0–10.5)

## 2014-02-25 LAB — URINALYSIS, ROUTINE W REFLEX MICROSCOPIC
Bilirubin Urine: NEGATIVE
Glucose, UA: NEGATIVE mg/dL
Ketones, ur: 15 mg/dL — AB
Leukocytes, UA: NEGATIVE
Nitrite: NEGATIVE
Protein, ur: NEGATIVE mg/dL
Specific Gravity, Urine: 1.037 — ABNORMAL HIGH (ref 1.005–1.030)
Urobilinogen, UA: 0.2 mg/dL (ref 0.0–1.0)
pH: 5 (ref 5.0–8.0)

## 2014-02-25 LAB — URINE MICROSCOPIC-ADD ON

## 2014-02-25 LAB — LIPASE, BLOOD: Lipase: 35 U/L (ref 11–59)

## 2014-02-25 MED ORDER — ONDANSETRON 4 MG PO TBDP
4.0000 mg | ORAL_TABLET | Freq: Once | ORAL | Status: AC
  Administered 2014-02-25: 4 mg via ORAL
  Filled 2014-02-25: qty 1

## 2014-02-25 MED ORDER — ONDANSETRON HCL 4 MG/2ML IJ SOLN
4.0000 mg | Freq: Once | INTRAMUSCULAR | Status: AC
  Administered 2014-02-25: 4 mg via INTRAVENOUS
  Filled 2014-02-25: qty 2

## 2014-02-25 MED ORDER — ONDANSETRON 4 MG PO TBDP: 4.0000 mg | ORAL_TABLET | Freq: Three times a day (TID) | ORAL | Status: DC | PRN

## 2014-02-25 MED ORDER — DICYCLOMINE HCL 20 MG PO TABS: 20.0000 mg | ORAL_TABLET | Freq: Two times a day (BID) | ORAL | Status: DC

## 2014-02-25 MED ORDER — SODIUM CHLORIDE 0.9 % IV BOLUS (SEPSIS)
1000.0000 mL | Freq: Once | INTRAVENOUS | Status: AC
  Administered 2014-02-25: 1000 mL via INTRAVENOUS

## 2014-02-25 MED ORDER — LORAZEPAM 2 MG/ML IJ SOLN
1.0000 mg | Freq: Once | INTRAMUSCULAR | Status: AC
  Administered 2014-02-25: 1 mg via INTRAVENOUS
  Filled 2014-02-25: qty 1

## 2014-02-25 MED ORDER — DIPHENHYDRAMINE HCL 50 MG/ML IJ SOLN
25.0000 mg | Freq: Once | INTRAMUSCULAR | Status: AC
  Administered 2014-02-25: 25 mg via INTRAVENOUS
  Filled 2014-02-25: qty 1

## 2014-02-25 MED ORDER — DICYCLOMINE HCL 10 MG/ML IM SOLN
20.0000 mg | Freq: Once | INTRAMUSCULAR | Status: AC
  Administered 2014-02-25: 20 mg via INTRAMUSCULAR
  Filled 2014-02-25: qty 2

## 2014-02-25 MED ORDER — MORPHINE SULFATE 4 MG/ML IJ SOLN
4.0000 mg | Freq: Once | INTRAMUSCULAR | Status: AC
  Administered 2014-02-25: 4 mg via INTRAVENOUS
  Filled 2014-02-25: qty 1

## 2014-02-25 NOTE — Discharge Instructions (Signed)
Take bentyl as needed for pain. Take zofran as needed for nausea. Follow up with your doctor for further evaluation.

## 2014-02-25 NOTE — ED Provider Notes (Signed)
Medical screening examination/treatment/procedure(s) were performed by non-physician practitioner and as supervising physician I was immediately available for consultation/collaboration.  Houa Ackert L Mataya Kilduff, MD 02/25/14 1732 

## 2014-02-25 NOTE — ED Notes (Addendum)
Pt presents today with c/o abdominal bloating since he was leaving from being seen his 2 days ago.  Pt in NAD, A&O.
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
## 2014-03-03 ENCOUNTER — Encounter (HOSPITAL_COMMUNITY): Payer: Self-pay | Admitting: Emergency Medicine

## 2014-03-03 ENCOUNTER — Emergency Department (HOSPITAL_COMMUNITY)
Admission: EM | Admit: 2014-03-03 | Discharge: 2014-03-03 | Disposition: A | Discharge status: Home / Self Care | Payer: No Typology Code available for payment source | Attending: Emergency Medicine | Admitting: Emergency Medicine

## 2014-03-03 DIAGNOSIS — K589 Irritable bowel syndrome without diarrhea: Secondary | ICD-10-CM | POA: Insufficient documentation

## 2014-03-03 DIAGNOSIS — R52 Pain, unspecified: Secondary | ICD-10-CM | POA: Insufficient documentation

## 2014-03-03 DIAGNOSIS — Z9889 Other specified postprocedural states: Secondary | ICD-10-CM | POA: Insufficient documentation

## 2014-03-03 DIAGNOSIS — R109 Unspecified abdominal pain: Secondary | ICD-10-CM | POA: Insufficient documentation

## 2014-03-03 DIAGNOSIS — Z79899 Other long term (current) drug therapy: Secondary | ICD-10-CM | POA: Insufficient documentation

## 2014-03-03 DIAGNOSIS — F17200 Nicotine dependence, unspecified, uncomplicated: Secondary | ICD-10-CM | POA: Insufficient documentation

## 2014-03-03 DIAGNOSIS — G8929 Other chronic pain: Secondary | ICD-10-CM | POA: Insufficient documentation

## 2014-03-03 DIAGNOSIS — Z872 Personal history of diseases of the skin and subcutaneous tissue: Secondary | ICD-10-CM | POA: Insufficient documentation

## 2014-03-03 DIAGNOSIS — Z87828 Personal history of other (healed) physical injury and trauma: Secondary | ICD-10-CM | POA: Insufficient documentation

## 2014-03-03 DIAGNOSIS — Z87442 Personal history of urinary calculi: Secondary | ICD-10-CM | POA: Insufficient documentation

## 2014-03-03 LAB — COMPREHENSIVE METABOLIC PANEL
ALT: 29 U/L (ref 0–53)
AST: 29 U/L (ref 0–37)
Albumin: 4.2 g/dL (ref 3.5–5.2)
Alkaline Phosphatase: 63 U/L (ref 39–117)
BUN: 11 mg/dL (ref 6–23)
CO2: 21 mEq/L (ref 19–32)
Calcium: 9.7 mg/dL (ref 8.4–10.5)
Chloride: 102 mEq/L (ref 96–112)
Creatinine, Ser: 0.89 mg/dL (ref 0.50–1.35)
GFR calc Af Amer: >90 mL/min (ref >90)
GFR calc non Af Amer: >90 mL/min (ref >90)
Glucose, Bld: 119 mg/dL — ABNORMAL HIGH (ref 70–99)
Potassium: 4.3 mEq/L (ref 3.7–5.3)
Sodium: 140 mEq/L (ref 137–147)
Total Bilirubin: 0.5 mg/dL (ref 0.3–1.2)
Total Protein: 8.1 g/dL (ref 6.0–8.3)

## 2014-03-03 LAB — URINE MICROSCOPIC-ADD ON

## 2014-03-03 LAB — CBC WITH DIFFERENTIAL/PLATELET
Basophils Absolute: 0 10*3/uL (ref 0.0–0.1)
Basophils Relative: 0 % (ref 0–1)
Eosinophils Absolute: 0.1 10*3/uL (ref 0.0–0.7)
Eosinophils Relative: 2 % (ref 0–5)
HCT: 43.1 % (ref 39.0–52.0)
Hemoglobin: 15.4 g/dL (ref 13.0–17.0)
Lymphocytes Relative: 39 % (ref 12–46)
Lymphs Abs: 2.8 10*3/uL (ref 0.7–4.0)
MCH: 32.2 pg (ref 26.0–34.0)
MCHC: 35.7 g/dL (ref 30.0–36.0)
MCV: 90.2 fL (ref 78.0–100.0)
Monocytes Absolute: 0.5 10*3/uL (ref 0.1–1.0)
Monocytes Relative: 7 % (ref 3–12)
Neutro Abs: 3.8 10*3/uL (ref 1.7–7.7)
Neutrophils Relative %: 52 % (ref 43–77)
Platelets: 222 10*3/uL (ref 150–400)
RBC: 4.78 MIL/uL (ref 4.22–5.81)
RDW: 13.1 % (ref 11.5–15.5)
WBC: 7.2 10*3/uL (ref 4.0–10.5)

## 2014-03-03 LAB — URINALYSIS, ROUTINE W REFLEX MICROSCOPIC
Glucose, UA: NEGATIVE mg/dL
Hgb urine dipstick: NEGATIVE
Ketones, ur: 15 mg/dL — AB
Nitrite: POSITIVE — AB
Protein, ur: 30 mg/dL — AB
Specific Gravity, Urine: 1.04 — ABNORMAL HIGH (ref 1.005–1.030)
Urobilinogen, UA: 1 mg/dL (ref 0.0–1.0)
pH: 5 (ref 5.0–8.0)

## 2014-03-03 LAB — LIPASE, BLOOD: Lipase: 29 U/L (ref 11–59)

## 2014-03-03 MED ORDER — DICYCLOMINE HCL 10 MG/ML IM SOLN
20.0000 mg | Freq: Once | INTRAMUSCULAR | Status: AC
  Administered 2014-03-03: 20 mg via INTRAMUSCULAR
  Filled 2014-03-03: qty 2

## 2014-03-03 MED ORDER — ONDANSETRON HCL 4 MG/2ML IJ SOLN
4.0000 mg | Freq: Once | INTRAMUSCULAR | Status: AC
  Administered 2014-03-03: 4 mg via INTRAVENOUS
  Filled 2014-03-03: qty 2

## 2014-03-03 NOTE — Discharge Instructions (Signed)
Abdominal Pain, Adult °Many things can cause abdominal pain. Usually, abdominal pain is not caused by a disease and will improve without treatment. It can often be observed and treated at home. Your health care provider will do a physical exam and possibly order blood tests and X-rays to help determine the seriousness of your pain. However, in many cases, more time must pass before a clear cause of the pain can be found. Before that point, your health care provider may not know if you need more testing or further treatment. °HOME CARE INSTRUCTIONS  °Monitor your abdominal pain for any changes. The following actions may help to alleviate any discomfort you are experiencing: °· Only take over-the-counter or prescription medicines as directed by your health care provider. °· Do not take laxatives unless directed to do so by your health care provider. °· Try a clear liquid diet (broth, tea, or water) as directed by your health care provider. Slowly move to a bland diet as tolerated. °SEEK MEDICAL CARE IF: °· You have unexplained abdominal pain. °· You have abdominal pain associated with nausea or diarrhea. °· You have pain when you urinate or have a bowel movement. °· You experience abdominal pain that wakes you in the night. °· You have abdominal pain that is worsened or improved by eating food. °· You have abdominal pain that is worsened with eating fatty foods. °SEEK IMMEDIATE MEDICAL CARE IF:  °· Your pain does not go away within 2 hours. °· You have a fever. °· You keep throwing up (vomiting). °· Your pain is felt only in portions of the abdomen, such as the right side or the left lower portion of the abdomen. °· You pass bloody or black tarry stools. °MAKE SURE YOU: °· Understand these instructions.   °· Will watch your condition.   °· Will get help right away if you are not doing well or get worse.   °Document Released: 08/30/2005 Document Revised: 09/10/2013 Document Reviewed: 07/30/2013 °[employer]® Patient  Information ©2014 [employer]. ° °
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
## 2014-03-04 LAB — URINE CULTURE
Colony Count: NO GROWTH
Culture: NO GROWTH

## 2014-03-05 ENCOUNTER — Emergency Department (HOSPITAL_COMMUNITY)
Admission: EM | Admit: 2014-03-05 | Discharge: 2014-03-06 | Disposition: A | Discharge status: Home / Self Care | Payer: No Typology Code available for payment source | Attending: Emergency Medicine | Admitting: Emergency Medicine

## 2014-03-05 DIAGNOSIS — Z872 Personal history of diseases of the skin and subcutaneous tissue: Secondary | ICD-10-CM | POA: Insufficient documentation

## 2014-03-05 DIAGNOSIS — R109 Unspecified abdominal pain: Secondary | ICD-10-CM

## 2014-03-05 DIAGNOSIS — F17200 Nicotine dependence, unspecified, uncomplicated: Secondary | ICD-10-CM | POA: Insufficient documentation

## 2014-03-05 DIAGNOSIS — R1084 Generalized abdominal pain: Secondary | ICD-10-CM | POA: Insufficient documentation

## 2014-03-05 DIAGNOSIS — Z87828 Personal history of other (healed) physical injury and trauma: Secondary | ICD-10-CM | POA: Insufficient documentation

## 2014-03-05 DIAGNOSIS — Z9089 Acquired absence of other organs: Secondary | ICD-10-CM | POA: Insufficient documentation

## 2014-03-05 DIAGNOSIS — G8929 Other chronic pain: Secondary | ICD-10-CM | POA: Insufficient documentation

## 2014-03-05 DIAGNOSIS — Z79899 Other long term (current) drug therapy: Secondary | ICD-10-CM | POA: Insufficient documentation

## 2014-03-05 DIAGNOSIS — Z8719 Personal history of other diseases of the digestive system: Secondary | ICD-10-CM | POA: Insufficient documentation

## 2014-03-05 DIAGNOSIS — Z9889 Other specified postprocedural states: Secondary | ICD-10-CM | POA: Insufficient documentation

## 2014-03-06 ENCOUNTER — Encounter (HOSPITAL_COMMUNITY): Payer: Self-pay | Admitting: Emergency Medicine

## 2014-03-06 ENCOUNTER — Emergency Department (HOSPITAL_COMMUNITY)
Admission: EM | Admit: 2014-03-06 | Discharge: 2014-03-06 | Disposition: A | Discharge status: Home / Self Care | Payer: No Typology Code available for payment source | Attending: Emergency Medicine | Admitting: Emergency Medicine

## 2014-03-06 DIAGNOSIS — Z9089 Acquired absence of other organs: Secondary | ICD-10-CM | POA: Insufficient documentation

## 2014-03-06 DIAGNOSIS — Z872 Personal history of diseases of the skin and subcutaneous tissue: Secondary | ICD-10-CM | POA: Insufficient documentation

## 2014-03-06 DIAGNOSIS — Z79899 Other long term (current) drug therapy: Secondary | ICD-10-CM | POA: Insufficient documentation

## 2014-03-06 DIAGNOSIS — F17200 Nicotine dependence, unspecified, uncomplicated: Secondary | ICD-10-CM | POA: Insufficient documentation

## 2014-03-06 DIAGNOSIS — Z87828 Personal history of other (healed) physical injury and trauma: Secondary | ICD-10-CM | POA: Insufficient documentation

## 2014-03-06 DIAGNOSIS — Z9889 Other specified postprocedural states: Secondary | ICD-10-CM | POA: Insufficient documentation

## 2014-03-06 DIAGNOSIS — G8929 Other chronic pain: Secondary | ICD-10-CM

## 2014-03-06 DIAGNOSIS — R1110 Vomiting, unspecified: Secondary | ICD-10-CM | POA: Insufficient documentation

## 2014-03-06 DIAGNOSIS — R634 Abnormal weight loss: Secondary | ICD-10-CM | POA: Insufficient documentation

## 2014-03-06 DIAGNOSIS — Z8719 Personal history of other diseases of the digestive system: Secondary | ICD-10-CM | POA: Insufficient documentation

## 2014-03-06 MED ORDER — ACETAMINOPHEN 325 MG PO TABS
650.0000 mg | ORAL_TABLET | Freq: Once | ORAL | Status: DC
  Filled 2014-03-06: qty 2

## 2014-03-06 MED ORDER — ONDANSETRON HCL 8 MG PO TABS: 8.0000 mg | ORAL_TABLET | Freq: Three times a day (TID) | ORAL | Status: DC | PRN

## 2014-03-06 MED ORDER — ONDANSETRON 8 MG PO TBDP
8.0000 mg | ORAL_TABLET | Freq: Once | ORAL | Status: DC
  Filled 2014-03-06: qty 1

## 2014-03-06 NOTE — ED Notes (Signed)
Pt states he has abd pain that started earlier today and has progressively gotten worse  Pt states pain radiates to his back  Pt states he has had some nausea and vomiting today

## 2014-03-06 NOTE — Discharge Instructions (Signed)

## 2014-03-06 NOTE — ED Notes (Signed)
Pt presents from home with complaint of RUQ abdominal pain that has been ongoing since yesterday.  Pt was seen in the ED on Monday.  Pt rates pain 7/10 that comes and goes.
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
## 2014-03-06 NOTE — ED Notes (Signed)
Upon trying to administered ordered medications, pt agitated, refusing medications and states he is going to [facility]. Pt also refusing d/c papers as well. Pt ambulating to exit in no acute distress, walking upright, no guarding or grimacing noted.

## 2014-03-06 NOTE — ED Notes (Signed)
Pt refused to sign, states " I'm not listening to that doctor. I am going up there ( his pcp ) and will come right back here"
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
## 2014-03-30 ENCOUNTER — Other Ambulatory Visit (HOSPITAL_COMMUNITY): Payer: Self-pay | Admitting: Gastroenterology

## 2014-03-30 DIAGNOSIS — R1084 Generalized abdominal pain: Secondary | ICD-10-CM

## 2014-03-30 DIAGNOSIS — R112 Nausea with vomiting, unspecified: Secondary | ICD-10-CM

## 2014-03-31 ENCOUNTER — Encounter (HOSPITAL_COMMUNITY): Payer: Self-pay | Admitting: *Deleted

## 2014-04-01 ENCOUNTER — Encounter (HOSPITAL_COMMUNITY): Payer: Self-pay | Admitting: Pharmacy Technician

## 2014-04-02 ENCOUNTER — Other Ambulatory Visit (HOSPITAL_COMMUNITY): Payer: Self-pay | Admitting: Gastroenterology

## 2014-04-02 ENCOUNTER — Ambulatory Visit (HOSPITAL_COMMUNITY)
Admission: RE | Admit: 2014-04-02 | Discharge: 2014-04-02 | Disposition: A | Discharge status: Home / Self Care | Payer: No Typology Code available for payment source | Source: Ambulatory Visit | Attending: Gastroenterology | Admitting: Gastroenterology

## 2014-04-02 DIAGNOSIS — R112 Nausea with vomiting, unspecified: Secondary | ICD-10-CM

## 2014-04-02 DIAGNOSIS — R1084 Generalized abdominal pain: Secondary | ICD-10-CM

## 2014-04-02 DIAGNOSIS — R109 Unspecified abdominal pain: Secondary | ICD-10-CM | POA: Insufficient documentation

## 2014-04-13 ENCOUNTER — Emergency Department (HOSPITAL_COMMUNITY)
Admission: EM | Admit: 2014-04-13 | Discharge: 2014-04-13 | Disposition: A | Discharge status: Home / Self Care | Payer: No Typology Code available for payment source | Attending: Emergency Medicine | Admitting: Emergency Medicine

## 2014-04-13 ENCOUNTER — Encounter (HOSPITAL_COMMUNITY): Payer: Self-pay | Admitting: Emergency Medicine

## 2014-04-13 ENCOUNTER — Other Ambulatory Visit: Payer: Self-pay | Admitting: Gastroenterology

## 2014-04-13 DIAGNOSIS — Z872 Personal history of diseases of the skin and subcutaneous tissue: Secondary | ICD-10-CM | POA: Insufficient documentation

## 2014-04-13 DIAGNOSIS — N189 Chronic kidney disease, unspecified: Secondary | ICD-10-CM | POA: Insufficient documentation

## 2014-04-13 DIAGNOSIS — R1084 Generalized abdominal pain: Secondary | ICD-10-CM | POA: Insufficient documentation

## 2014-04-13 DIAGNOSIS — R109 Unspecified abdominal pain: Secondary | ICD-10-CM

## 2014-04-13 DIAGNOSIS — Z8739 Personal history of other diseases of the musculoskeletal system and connective tissue: Secondary | ICD-10-CM | POA: Insufficient documentation

## 2014-04-13 DIAGNOSIS — Z87828 Personal history of other (healed) physical injury and trauma: Secondary | ICD-10-CM | POA: Insufficient documentation

## 2014-04-13 DIAGNOSIS — Z9889 Other specified postprocedural states: Secondary | ICD-10-CM | POA: Insufficient documentation

## 2014-04-13 DIAGNOSIS — R112 Nausea with vomiting, unspecified: Secondary | ICD-10-CM | POA: Insufficient documentation

## 2014-04-13 DIAGNOSIS — F17200 Nicotine dependence, unspecified, uncomplicated: Secondary | ICD-10-CM | POA: Insufficient documentation

## 2014-04-13 DIAGNOSIS — Z9089 Acquired absence of other organs: Secondary | ICD-10-CM | POA: Insufficient documentation

## 2014-04-13 DIAGNOSIS — G8929 Other chronic pain: Secondary | ICD-10-CM | POA: Insufficient documentation

## 2014-04-13 DIAGNOSIS — K5900 Constipation, unspecified: Secondary | ICD-10-CM | POA: Insufficient documentation
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
## 2014-04-13 NOTE — Addendum Note (Signed)
Addended by: Wilford Corner on: 04/13/2014 03:40 PM   Modules accepted: Orders

## 2014-04-13 NOTE — Progress Notes (Signed)
[facility] CL spoke with patient about [employer]. Patient PCP is [facility]. Patient stated that he had colonoscopy scheduled for tomorrow. CL provided pt with f/u apt information at pcp on 5/14 2:45pm.

## 2014-04-13 NOTE — ED Notes (Signed)
Pt refused d/c vitals.

## 2014-04-13 NOTE — Discharge Instructions (Signed)
Chronic Pain Chronic pain can be defined as pain that is off and on and lasts for 3 6 months or longer. Many things cause chronic pain, which can make it difficult to make a diagnosis. There are many treatment options available for chronic pain. However, finding a treatment that works well for you may require trying various approaches until the right one is found. Many people benefit from a combination of two or more types of treatment to control their pain. SYMPTOMS  Chronic pain can occur anywhere in the body and can range from mild to very severe. Some types of chronic pain include:  Headache.  Low back pain.  Cancer pain.  Arthritis pain.  Neurogenic pain. This is pain resulting from damage to nerves. People with chronic pain may also have other symptoms such as:  Depression.  Anger.  Insomnia.  Anxiety. DIAGNOSIS  Your health care provider will help diagnose your condition over time. In many cases, the initial focus will be on excluding possible conditions that could be causing the pain. Depending on your symptoms, your health care provider may order tests to diagnose your condition. Some of these tests may include:   Blood tests.   CT scan.   MRI.   X-rays.   Ultrasounds.   Nerve conduction studies.  You may need to see a specialist.  TREATMENT  Finding treatment that works well may take time. You may be referred to a pain specialist. He or she may prescribe medicine or therapies, such as:   Mindful meditation or yoga.  Shots (injections) of numbing or pain-relieving medicines into the spine or area of pain.  Local electrical stimulation.  Acupuncture.   Massage therapy.   Aroma, color, light, or sound therapy.   Biofeedback.   Working with a physical therapist to keep from getting stiff.   Regular, gentle exercise.   Cognitive or behavioral therapy.   Group support.  Sometimes, surgery may be recommended.  HOME CARE INSTRUCTIONS    Take all medicines as directed by your health care provider.   Lessen stress in your life by relaxing and doing things such as listening to calming music.   Exercise or be active as directed by your health care provider.   Eat a healthy diet and include things such as vegetables, fruits, fish, and lean meats in your diet.   Keep all follow-up appointments with your health care provider.   Attend a support group with others suffering from chronic pain. SEEK MEDICAL CARE IF:   Your pain gets worse.   You develop a new pain that was not there before.   You cannot tolerate medicines given to you by your health care provider.   You have new symptoms since your last visit with your health care provider.  SEEK IMMEDIATE MEDICAL CARE IF:   You feel weak.   You have decreased sensation or numbness.   You lose control of bowel or bladder function.   Your pain suddenly gets much worse.   You develop shaking.  You develop chills.  You develop confusion.  You develop chest pain.  You develop shortness of breath.  MAKE SURE YOU:  Understand these instructions.  Will watch your condition.  Will get help right away if you are not doing well or get worse. Document Released: 08/12/2002 Document Revised: 07/23/2013 Document Reviewed: 05/16/2013 [facility] Patient Information 2014 [place].  Chronic Pain Discharge Instructions  Emergency care providers appreciate that many patients coming to [place] are in severe pain  and we wish to address their pain in the safest, most responsible manner.  It is important to recognize however, that the proper treatment of chronic pain differs from that of the pain of injuries and acute illnesses.  Our goal is to provide quality, safe, personalized care and we thank you for giving [place] the opportunity to serve you. The use of narcotics and related agents for chronic pain syndromes may lead to additional physical and psychological  problems.  Nearly as many people die from prescription narcotics each year as die from car crashes.  Additionally, this risk is increased if such prescriptions are obtained from a variety of sources.  Therefore, only your primary care physician or a pain management specialist is able to safely treat such syndromes with narcotic medications long-term.    Documentation revealing such prescriptions have been sought from multiple sources may prohibit [place] from providing a refill or different narcotic medication.  Your name may be checked first through the [place].  This database is a record of controlled substance medication prescriptions that the patient has received.  This has been established by [facility] in an effort to eliminate the dangerous, and often life threatening, practice of obtaining multiple prescriptions from different medical providers.   If you have a chronic pain syndrome (i.e. chronic headaches, recurrent back or neck pain, dental pain, abdominal or pelvis pain without a specific diagnosis, or neuropathic pain such as fibromyalgia) or recurrent visits for the same condition without an acute diagnosis, you may be treated with non-narcotics and other non-addictive medicines.  Allergic reactions or negative side effects that may be reported by a patient to such medications will not typically lead to the use of a narcotic analgesic or other controlled substance as an alternative.   Patients managing chronic pain with a personal physician should have provisions in place for breakthrough pain.  If you are in crisis, you should call your physician.  If your physician directs you to the emergency department, please have the doctor call and speak to our attending physician concerning your care.   When patients come to the Emergency Department (ED) with acute medical conditions in which the Emergency Department physician feels appropriate to prescribe  narcotic or sedating pain medication, the physician will prescribe these in very limited quantities.  The amount of these medications will last only until you can see your primary care physician in his/her office.  Any patient who returns to the ED seeking refills should expect only non-narcotic pain medications.   In the event of an acute medical condition exists and the emergency physician feels it is necessary that the patient be given a narcotic or sedating medication -  a responsible adult driver should be present in the room prior to the medication being given by the nurse.   Prescriptions for narcotic or sedating medications that have been lost, stolen or expired will not be refilled in the Emergency Department.    Patients who have chronic pain may receive non-narcotic prescriptions until seen by their primary care physician.  It is every patients personal responsibility to maintain active prescriptions with his or her primary care physician or specialist.

## 2014-04-13 NOTE — ED Notes (Addendum)
This Agricultural consultant was made aware by Joaquim Lai PA that Pt became upset and verbally abusive, during her assessment.  She informed him that she would be discharging him.  She reported that Pt became upset when she informed him that he would not be getting narcotics, because his pain is chronic and he has a colonoscopy tomorrow.  When this RN went to speak w/ the Pt, he was in the middle of the hallway yelling at staff and security.  This RN introduced herself and redirect Pt back into his room.  This RN explained to the Pt that our providers are cutting back on giving  Pts w/ chronic pain narcotics.  Pt continued to say "this only happens at [facility].  They treat me at [facility].  Every time I come here, they start talking about drugs."  This RN educated the Pt that the same providers are at [facility] and [facility].  This RN asked the Pt what his expectation was and he responded that he just wanted his 2pm dose of Doculax, because the ambulance gave him pain medication.  This RN attempted to smooth over the situation and provided the Pt w/ Dub Mikes' name as asked.  Pt was, also, directed to continue his colonoscopy prep, when he returned home.

## 2014-04-13 NOTE — ED Notes (Signed)
Per EMS: Pt reports right lower quadrant abdominal pain that began at 11:30 this morning, however has had chronic abdominal pain that has been ongoing for 7 years. Pt is scheduled to have endoscopy tomorrow. Pt reports nausea, emesis, and constipation. Pt was given 4 mg of Zofran and 50 mcg of Fentanyl in route to facility. Pt is A/O x4.

## 2014-04-14 ENCOUNTER — Encounter (HOSPITAL_COMMUNITY): Payer: No Typology Code available for payment source | Admitting: Anesthesiology

## 2014-04-14 ENCOUNTER — Ambulatory Visit (HOSPITAL_COMMUNITY): Payer: No Typology Code available for payment source | Admitting: Anesthesiology

## 2014-04-14 ENCOUNTER — Ambulatory Visit (HOSPITAL_COMMUNITY)
Admission: RE | Admit: 2014-04-14 | Discharge: 2014-04-14 | Disposition: A | Discharge status: Home / Self Care | Payer: No Typology Code available for payment source | Source: Ambulatory Visit | Attending: Gastroenterology | Admitting: Gastroenterology

## 2014-04-14 ENCOUNTER — Encounter (HOSPITAL_COMMUNITY): Payer: Self-pay | Admitting: *Deleted

## 2014-04-14 ENCOUNTER — Encounter (HOSPITAL_COMMUNITY)
Admission: RE | Disposition: A | Discharge status: Home / Self Care | Payer: Self-pay | Source: Ambulatory Visit | Attending: Gastroenterology

## 2014-04-14 DIAGNOSIS — K2960 Other gastritis without bleeding: Secondary | ICD-10-CM | POA: Insufficient documentation

## 2014-04-14 DIAGNOSIS — F17200 Nicotine dependence, unspecified, uncomplicated: Secondary | ICD-10-CM | POA: Insufficient documentation

## 2014-04-14 DIAGNOSIS — D126 Benign neoplasm of colon, unspecified: Secondary | ICD-10-CM | POA: Insufficient documentation

## 2014-04-14 HISTORY — PX: ESOPHAGOGASTRODUODENOSCOPY (EGD) WITH PROPOFOL: SHX5813

## 2014-04-14 HISTORY — DX: Other specified soft tissue disorders: M79.89

## 2014-04-14 HISTORY — DX: Personal history of other medical treatment: Z92.89

## 2014-04-14 HISTORY — PX: COLONOSCOPY WITH PROPOFOL: SHX5780

## 2014-04-14 SURGERY — ESOPHAGOGASTRODUODENOSCOPY (EGD) WITH PROPOFOL
Anesthesia: Monitor Anesthesia Care

## 2014-04-14 MED ORDER — SODIUM CHLORIDE 0.9 % IV SOLN: INTRAVENOUS | Status: DC

## 2014-04-14 MED ORDER — BUTAMBEN-TETRACAINE-BENZOCAINE 2-2-14 % EX AERO
INHALATION_SPRAY | CUTANEOUS | Status: DC | PRN
  Administered 2014-04-14: 2 via TOPICAL

## 2014-04-14 MED ORDER — LACTATED RINGERS IV SOLN
INTRAVENOUS | Status: DC | PRN
  Administered 2014-04-14: 09:00:00 via INTRAVENOUS

## 2014-04-14 MED ORDER — MIDAZOLAM HCL 5 MG/5ML IJ SOLN
INTRAMUSCULAR | Status: DC | PRN
  Administered 2014-04-14: 2 mg via INTRAVENOUS

## 2014-04-14 MED ORDER — PROPOFOL INFUSION 10 MG/ML OPTIME
INTRAVENOUS | Status: DC | PRN
  Administered 2014-04-14: 200 ug/kg/min via INTRAVENOUS

## 2014-04-14 MED ORDER — GLYCOPYRROLATE 0.2 MG/ML IJ SOLN
INTRAMUSCULAR | Status: DC | PRN
  Administered 2014-04-14: 0.2 mg via INTRAVENOUS

## 2014-04-14 MED ORDER — PROPOFOL 10 MG/ML IV BOLUS
INTRAVENOUS | Status: AC
  Filled 2014-04-14: qty 20

## 2014-04-14 MED ORDER — FENTANYL CITRATE 0.05 MG/ML IJ SOLN
INTRAMUSCULAR | Status: DC | PRN
  Administered 2014-04-14: 100 ug via INTRAVENOUS

## 2014-04-14 MED ORDER — FENTANYL CITRATE 0.05 MG/ML IJ SOLN: 150.0000 ug | Freq: Once | INTRAMUSCULAR | Status: DC

## 2014-04-14 MED ORDER — LIDOCAINE HCL (CARDIAC) 20 MG/ML IV SOLN
INTRAVENOUS | Status: DC | PRN
  Administered 2014-04-14: 30 mg via INTRAVENOUS

## 2014-04-14 MED ORDER — FENTANYL CITRATE 0.05 MG/ML IJ SOLN
INTRAMUSCULAR | Status: AC
  Filled 2014-04-14: qty 2

## 2014-04-14 MED ORDER — LACTATED RINGERS IV SOLN
INTRAVENOUS | Status: DC
  Administered 2014-04-14: 1000 mL via INTRAVENOUS

## 2014-04-14 MED ORDER — MIDAZOLAM HCL 2 MG/2ML IJ SOLN
INTRAMUSCULAR | Status: AC
  Filled 2014-04-14: qty 2

## 2014-04-14 MED ORDER — FENTANYL CITRATE 0.05 MG/ML IJ SOLN
50.0000 ug | INTRAMUSCULAR | Status: DC | PRN
  Administered 2014-04-14: 50 ug via INTRAVENOUS

## 2014-04-14 SURGICAL SUPPLY — 24 items

## 2014-04-14 NOTE — Anesthesia Procedure Notes (Addendum)
Procedures

## 2014-04-14 NOTE — H&P (Signed)
  Date of Initial H&P: 03/30/14  History reviewed, patient examined, no change in status, stable for surgery. 

## 2014-04-14 NOTE — Anesthesia Preprocedure Evaluation (Addendum)
Anesthesia Evaluation  Patient identified by MRN, date of birth, ID band Patient awake    Reviewed: Allergy & Precautions, H&P , NPO status , Patient's Chart, lab work & pertinent test results  Airway Mallampati: II TM Distance: >3 FB Neck ROM: Full    Dental no notable dental hx.    Pulmonary Current Smoker,  breath sounds clear to auscultation  Pulmonary exam normal       Cardiovascular negative cardio ROS  Rhythm:Regular Rate:Normal     Neuro/Psych negative neurological ROS  negative psych ROS   GI/Hepatic negative GI ROS, Neg liver ROS,   Endo/Other  negative endocrine ROS  Renal/GU negative Renal ROS  negative genitourinary   Musculoskeletal negative musculoskeletal ROS (+)   Abdominal   Peds negative pediatric ROS (+)  Hematology negative hematology ROS (+)   Anesthesia Other Findings   Reproductive/Obstetrics negative OB ROS                          Anesthesia Physical Anesthesia Plan  ASA: II  Anesthesia Plan: General and MAC   Post-op Pain Management:    Induction: Intravenous  Airway Management Planned: Nasal Cannula  Additional Equipment:   Intra-op Plan:   Post-operative Plan:   Informed Consent: I have reviewed the patients History and Physical, chart, labs and discussed the procedure including the risks, benefits and alternatives for the proposed anesthesia with the patient or authorized representative who has indicated his/her understanding and acceptance.   Dental advisory given  Plan Discussed with: CRNA and Surgeon  Anesthesia Plan Comments:         Anesthesia Quick Evaluation

## 2014-04-14 NOTE — Op Note (Signed)
[facility] [place], [address]   ENDOSCOPY PROCEDURE REPORT  PATIENT: Shay, Bartoli.  MR#: [id number] BIRTHDATE: 12-Jan-1966 , 48  yrs. old GENDER: Male  ENDOSCOPIST: Wilford Corner, MD REFERRED [id number] Doreene Burke, M.D.  PROCEDURE DATE:  04/14/2014 PROCEDURE:   EGD, diagnostic ASA CLASS:   Class III INDICATIONS:Nausea.   Vomiting.   abdominal pain. MEDICATIONS: See Anesthesia Report.  TOPICAL ANESTHETIC:  DESCRIPTION OF PROCEDURE:   After the risks benefits and alternatives of the procedure were thoroughly explained, informed consent was obtained.  The Pentax Gastroscope [id number]  endoscope was introduced through the mouth and advanced to the second portion of the duodenum , limited by Without limitations.   The instrument was slowly withdrawn as the mucosa was fully examined.     FINDINGS: The endoscope was inserted into the oropharynx and esophagus was intubated. The esophagus was normal in its entirety. The gastroesophageal junction was noted to be 42 cm from the incisors.  Endoscope was advanced into the stomach, which revealed mild antral erythema consistent with antral gastritis.  The endoscope was advanced to the duodenal bulb and second portion of duodenum which were unremarkable.  The endoscope was withdrawn back into the stomach and retroflexion revealed a normal proximal stomach.  COMPLICATIONS: None  ENDOSCOPIC IMPRESSION:     Mild antral gastritis otherwise normal EGD  RECOMMENDATIONS: Proceed with colonoscopy; Prilosec 20 mg PO QD   REPEAT EXAM: N/A  _______________________________ Wilford Corner, MD eSigned:  Wilford Corner, MD 04/14/2014 10:55 AM    CC:  PATIENT NAME:  [name]. MR#: [id number]

## 2014-04-14 NOTE — Discharge Instructions (Addendum)
Avoid all Ibuprofen products for 5 days. Will call when pathology results complete. Start PRILOSEC 20 mg by mouth once a day.

## 2014-04-14 NOTE — Progress Notes (Signed)
Patient with C/O of abdominal rated at 6 on scale of one to ten. Fentanyl 50  mcg  Given iv at 0927 for pain .abdominal tenderness 0937 patient that he feels much better rates pain at 1 .

## 2014-04-14 NOTE — Op Note (Signed)
[facility] [place], [address]   COLONOSCOPY PROCEDURE REPORT  PATIENT: Nathan, [name].  MR#: [id number] BIRTHDATE: 04/30/1966 , 48  yrs. old GENDER: Male ENDOSCOPIST: Wilford Corner, MD REFERRED [id number] Doreene Burke, M.D. PROCEDURE DATE:  04/14/2014 PROCEDURE:   Colonoscopy with biopsy ASA CLASS:   Class III INDICATIONS:Abdominal pain. MEDICATIONS: See Anesthesia Report.  DESCRIPTION OF PROCEDURE:   After the risks benefits and alternatives of the procedure were thoroughly explained, informed consent was obtained.  The Pentax Ped Colon [id number]  endoscope was introduced through the anus and advanced to the surgical anastomosis , limited by No adverse events experienced.   The quality of the prep was good. .  The instrument was then slowly withdrawn as the colon was fully examined.     FINDINGS:  Rectal exam unremarkable.  Pediatric colonoscope inserted into the colon and advanced to the ileocolonic anastomosis, which was normal in appearance. The colonoscope was inserted into the neoterminal ileum, which was normal in the examined portion. On careful withdrawal of the colonoscope a 2 mm flat polyp was seen in the sigmoid colon that was removed with a cold biopsy. Retroflexion revealed medium-sized internal hemorrhoids.  COMPLICATIONS: None  IMPRESSION:     Diminutive colon polyp - s/p cold biopsy forceps Medium-sized internal hemorrhoids Normal appearing ileocolonic anastomosis  RECOMMENDATIONS:  F/U on path; Suspect abdominal pain due to adhesions    ______________________________ eSigned:  Wilford Corner, MD 04/14/2014 11:02 AM   CC:

## 2014-04-14 NOTE — Interval H&P Note (Signed)
History and Physical Interval Note:  04/14/2014 9:59 AM  [name]  has presented today for surgery, with the diagnosis of abd.pain/nausea/vomiting  The various methods of treatment have been discussed with the patient and family. After consideration of risks, benefits and other options for treatment, the patient has consented to  Procedure(s): ESOPHAGOGASTRODUODENOSCOPY (EGD) WITH PROPOFOL (N/A) COLONOSCOPY WITH PROPOFOL (N/A) as a surgical intervention .  The patient's history has been reviewed, patient examined, no change in status, stable for surgery.  I have reviewed the patient's chart and labs.  Questions were answered to the patient's satisfaction.     [name]

## 2014-04-14 NOTE — Transfer of Care (Signed)
Immediate Anesthesia Transfer of Care Note  Patient: [name]  Procedure(s) Performed: Procedure(s): ESOPHAGOGASTRODUODENOSCOPY (EGD) WITH PROPOFOL (N/A) COLONOSCOPY WITH PROPOFOL (N/A)  Patient Location: PACU  Anesthesia Type:MAC  Level of Consciousness: sedated and patient cooperative  Airway & Oxygen Therapy: Patient Spontanous Breathing and Patient connected to nasal cannula oxygen  Post-op Assessment: Report given to PACU RN and Post -op Vital signs reviewed and stable  Post vital signs: stable  Complications: No apparent anesthesia complications

## 2014-04-15 ENCOUNTER — Encounter (HOSPITAL_COMMUNITY): Payer: Self-pay | Admitting: Gastroenterology

## 2014-04-15 NOTE — ED Provider Notes (Signed)
Medical screening examination/treatment/procedure(s) were performed by non-physician practitioner and as supervising physician I was immediately available for consultation/collaboration.   EKG Interpretation None       Babette Relic, MD 04/15/14 2126

## 2014-04-15 NOTE — Anesthesia Postprocedure Evaluation (Signed)
  Anesthesia Post-op Note  Patient: [name]  Procedure(s) Performed: Procedure(s) (LRB): ESOPHAGOGASTRODUODENOSCOPY (EGD) WITH PROPOFOL (N/A) COLONOSCOPY WITH PROPOFOL (N/A)  Patient Location: PACU  Anesthesia Type: MAC  Level of Consciousness: awake and alert   Airway and Oxygen Therapy: Patient Spontanous Breathing  Post-op Pain: mild  Post-op Assessment: Post-op Vital signs reviewed, Patient's Cardiovascular Status Stable, Respiratory Function Stable, Patent Airway and No signs of Nausea or vomiting  Last Vitals:  Filed Vitals:   04/14/14 1141  BP: 139/102  Pulse: 47  Temp:   Resp: 14    Post-op Vital Signs: stable   Complications: No apparent anesthesia complications

## 2014-04-16 ENCOUNTER — Ambulatory Visit: Payer: No Typology Code available for payment source | Attending: Internal Medicine | Admitting: Internal Medicine

## 2014-04-16 ENCOUNTER — Encounter: Payer: Self-pay | Admitting: Internal Medicine

## 2014-04-16 VITALS — BP 110/70 | HR 58 | Temp 98.9°F | Resp 16 | Ht 70.0 in | Wt 137.0 lb

## 2014-04-16 DIAGNOSIS — IMO0001 Reserved for inherently not codable concepts without codable children: Secondary | ICD-10-CM | POA: Insufficient documentation

## 2014-04-16 DIAGNOSIS — W3301XA Accidental discharge of shotgun, initial encounter: Secondary | ICD-10-CM | POA: Insufficient documentation

## 2014-04-16 DIAGNOSIS — N189 Chronic kidney disease, unspecified: Secondary | ICD-10-CM | POA: Insufficient documentation

## 2014-04-16 DIAGNOSIS — R1011 Right upper quadrant pain: Secondary | ICD-10-CM | POA: Insufficient documentation

## 2014-04-16 DIAGNOSIS — Z79899 Other long term (current) drug therapy: Secondary | ICD-10-CM | POA: Insufficient documentation

## 2014-04-16 DIAGNOSIS — G8929 Other chronic pain: Secondary | ICD-10-CM

## 2014-04-16 MED ORDER — NAPROXEN 500 MG PO TABS: 500.0000 mg | ORAL_TABLET | Freq: Two times a day (BID) | ORAL | Status: DC

## 2014-04-16 MED ORDER — ACETAMINOPHEN-CODEINE #3 300-30 MG PO TABS: 1.0000 | ORAL_TABLET | ORAL | Status: DC | PRN

## 2014-04-16 NOTE — Progress Notes (Signed)
Pt is here following up after his colonoscopy procedure. Pt is having bloating and he is still having the contrast in his stool.
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
## 2014-05-13 ENCOUNTER — Telehealth: Payer: Self-pay | Admitting: Emergency Medicine

## 2014-05-13 ENCOUNTER — Telehealth: Payer: Self-pay | Admitting: Internal Medicine

## 2014-05-13 NOTE — Telephone Encounter (Signed)
Left message for pt to call clinic when message received

## 2014-05-13 NOTE — Telephone Encounter (Signed)
Pt needs refill for Acetami/codein. He has been denied by [facility] because they do not treat abd pain and pt cannot afford out of pocket copay for other clinics. Please f/u with pt.  Pt's next f/u appt is on 10/15/14. Pt uses [facility].

## 2014-05-16 ENCOUNTER — Encounter (HOSPITAL_COMMUNITY): Payer: Self-pay | Admitting: Emergency Medicine

## 2014-05-16 ENCOUNTER — Emergency Department (HOSPITAL_COMMUNITY)
Admission: EM | Admit: 2014-05-16 | Discharge: 2014-05-16 | Disposition: A | Discharge status: Home / Self Care | Payer: No Typology Code available for payment source | Attending: Emergency Medicine | Admitting: Emergency Medicine

## 2014-05-16 DIAGNOSIS — Z9889 Other specified postprocedural states: Secondary | ICD-10-CM | POA: Insufficient documentation

## 2014-05-16 DIAGNOSIS — N189 Chronic kidney disease, unspecified: Secondary | ICD-10-CM | POA: Insufficient documentation

## 2014-05-16 DIAGNOSIS — Z87828 Personal history of other (healed) physical injury and trauma: Secondary | ICD-10-CM | POA: Insufficient documentation

## 2014-05-16 DIAGNOSIS — Z87442 Personal history of urinary calculi: Secondary | ICD-10-CM | POA: Insufficient documentation

## 2014-05-16 DIAGNOSIS — G8929 Other chronic pain: Secondary | ICD-10-CM | POA: Insufficient documentation

## 2014-05-16 DIAGNOSIS — Z9089 Acquired absence of other organs: Secondary | ICD-10-CM | POA: Insufficient documentation

## 2014-05-16 DIAGNOSIS — R109 Unspecified abdominal pain: Secondary | ICD-10-CM | POA: Insufficient documentation

## 2014-05-16 DIAGNOSIS — R112 Nausea with vomiting, unspecified: Secondary | ICD-10-CM | POA: Insufficient documentation

## 2014-05-16 DIAGNOSIS — F17200 Nicotine dependence, unspecified, uncomplicated: Secondary | ICD-10-CM | POA: Insufficient documentation

## 2014-05-16 DIAGNOSIS — M549 Dorsalgia, unspecified: Secondary | ICD-10-CM | POA: Insufficient documentation

## 2014-05-16 LAB — LIPASE, BLOOD: Lipase: 50 U/L (ref 11–59)

## 2014-05-16 LAB — COMPREHENSIVE METABOLIC PANEL
ALT: 21 U/L (ref 0–53)
AST: 19 U/L (ref 0–37)
Albumin: 4.3 g/dL (ref 3.5–5.2)
Alkaline Phosphatase: 68 U/L (ref 39–117)
BUN: 15 mg/dL (ref 6–23)
CO2: 18 mEq/L — ABNORMAL LOW (ref 19–32)
Calcium: 10 mg/dL (ref 8.4–10.5)
Chloride: 100 mEq/L (ref 96–112)
Creatinine, Ser: 1.02 mg/dL (ref 0.50–1.35)
GFR calc Af Amer: >90 mL/min (ref >90)
GFR calc non Af Amer: 84 mL/min — ABNORMAL LOW (ref >90)
Glucose, Bld: 153 mg/dL — ABNORMAL HIGH (ref 70–99)
Potassium: 3.8 mEq/L (ref 3.7–5.3)
Sodium: 136 mEq/L — ABNORMAL LOW (ref 137–147)
Total Bilirubin: 0.5 mg/dL (ref 0.3–1.2)
Total Protein: 8.1 g/dL (ref 6.0–8.3)

## 2014-05-16 LAB — CBC WITH DIFFERENTIAL/PLATELET
Basophils Absolute: 0 10*3/uL (ref 0.0–0.1)
Basophils Relative: 0 % (ref 0–1)
Eosinophils Absolute: 0 10*3/uL (ref 0.0–0.7)
Eosinophils Relative: 0 % (ref 0–5)
HCT: 41.6 % (ref 39.0–52.0)
Hemoglobin: 14.3 g/dL (ref 13.0–17.0)
Lymphocytes Relative: 18 % (ref 12–46)
Lymphs Abs: 1.7 10*3/uL (ref 0.7–4.0)
MCH: 31.5 pg (ref 26.0–34.0)
MCHC: 34.4 g/dL (ref 30.0–36.0)
MCV: 91.6 fL (ref 78.0–100.0)
Monocytes Absolute: 0.7 10*3/uL (ref 0.1–1.0)
Monocytes Relative: 7 % (ref 3–12)
Neutro Abs: 6.9 10*3/uL (ref 1.7–7.7)
Neutrophils Relative %: 75 % (ref 43–77)
Platelets: 219 10*3/uL (ref 150–400)
RBC: 4.54 MIL/uL (ref 4.22–5.81)
RDW: 13.3 % (ref 11.5–15.5)
WBC: 9.2 10*3/uL (ref 4.0–10.5)

## 2014-05-16 LAB — URINALYSIS, ROUTINE W REFLEX MICROSCOPIC
Glucose, UA: NEGATIVE mg/dL
Ketones, ur: 15 mg/dL — AB
Leukocytes, UA: NEGATIVE
Nitrite: NEGATIVE
Protein, ur: 30 mg/dL — AB
Specific Gravity, Urine: 1.044 — ABNORMAL HIGH (ref 1.005–1.030)
Urobilinogen, UA: 1 mg/dL (ref 0.0–1.0)
pH: 5.5 (ref 5.0–8.0)

## 2014-05-16 LAB — URINE MICROSCOPIC-ADD ON

## 2014-05-16 MED ORDER — SODIUM CHLORIDE 0.9 % IV BOLUS (SEPSIS)
1000.0000 mL | Freq: Once | INTRAVENOUS | Status: AC
  Administered 2014-05-16: 1000 mL via INTRAVENOUS

## 2014-05-16 MED ORDER — OXYCODONE-ACETAMINOPHEN 5-325 MG PO TABS
1.0000 | ORAL_TABLET | Freq: Once | ORAL | Status: AC
  Administered 2014-05-16: 1 via ORAL
  Filled 2014-05-16: qty 1

## 2014-05-16 NOTE — ED Notes (Signed)
Patient still rolling around in bed requesting pain medication.  Will inform MD.

## 2014-05-16 NOTE — ED Notes (Signed)
Patient still moaning and yelling.

## 2014-05-16 NOTE — ED Provider Notes (Signed)
Medical screening examination/treatment/procedure(s) were performed by non-physician practitioner and as supervising physician I was immediately available for consultation/collaboration.   EKG Interpretation None       Jasper Riling. Alvino Chapel, [place] 05/16/14 [id number]

## 2014-05-16 NOTE — ED Notes (Signed)
Patient requests "a bag of that fluid, you know i'm getting dehydrated now".

## 2014-05-16 NOTE — Discharge Instructions (Signed)
Please read and follow all provided instructions.  Your diagnoses today include:  1. Chronic abdominal pain     Tests performed today include:  Blood counts and electrolytes  Blood tests to check liver and kidney function  Blood tests to check pancreas function  Urine test to look for infection  Vital signs. See below for your results today.   Medications prescribed:   None  Take any prescribed medications only as directed.  Home care instructions:   Follow any educational materials contained in this packet.  Follow-up instructions: Please follow-up with your primary care provider in the next 2 days for further evaluation of your symptoms.   Return instructions:  SEEK IMMEDIATE MEDICAL ATTENTION IF:  The pain does not go away or becomes severe   A temperature above 101F develops   Repeated vomiting occurs (multiple episodes)   The pain becomes localized to portions of the abdomen. The right side could possibly be appendicitis. In an adult, the left lower portion of the abdomen could be colitis or diverticulitis.   Blood is being passed in stools or vomit (bright red or black tarry stools)   You develop chest pain, difficulty breathing, dizziness or fainting, or become confused, poorly responsive, or inconsolable (young children)  If you have any other emergent concerns regarding your health  Additional Information: Abdominal (belly) pain can be caused by many things. Your caregiver performed an examination and possibly ordered blood/urine tests and imaging (CT scan, x-rays, ultrasound). Many cases can be observed and treated at home after initial evaluation in the emergency department. Even though you are being discharged home, abdominal pain can be unpredictable. Therefore, you need a repeated exam if your pain does not resolve, returns, or worsens. Most patients with abdominal pain don't have to be admitted to the hospital or have surgery, but serious problems like  appendicitis and gallbladder attacks can start out as nonspecific pain. Many abdominal conditions cannot be diagnosed in one visit, so follow-up evaluations are very important.  Your vital signs today were: BP 111/75   Pulse 54   Temp(Src) 97.7 F (36.5 C) (Oral)   Resp 22   SpO2 100% If your blood pressure (bp) was elevated above 135/85 this visit, please have this repeated by your doctor within one month. --------------

## 2014-05-16 NOTE — ED Notes (Signed)
Verified that Dr. Cheri Guppy knows patient is awaiting medication. MD acknowledges, no new orders received.

## 2014-05-16 NOTE — ED Notes (Signed)
Pt called clinic on 6/10 to get pain medication refilled.  They called him back and left a message for him to call them.  He states he tried to call and left a message for them again.

## 2014-05-16 NOTE — ED Notes (Addendum)
C/o generalized abd pain that started Friday morning with nausea and vomiting.  Pt reports history of same and states he is out of pain medication.  Denies diarrhea.  Pt yelling out in pain.  States he normally takes Tylenol #3 for pain.

## 2014-05-16 NOTE — ED Notes (Signed)
Gave patient cup of water to drink.

## 2014-05-16 NOTE — ED Notes (Signed)
Patient requested sprite and cranberry juice.  Available at the bedside.

## 2014-05-16 NOTE — ED Notes (Signed)
Dr. Cheri Guppy allows patient to have a cup of water.
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
## 2014-05-16 NOTE — ED Notes (Addendum)
Provided pt with warm blankets and moist washcloth. Pt stated that"when am I gonna be seen by the doctor, I've been here two hours, that's not right." Explained to pt that he is being monitored and taken care of by nursing staff. Pt stated, "the nurse acted like she didn't want to talk to me, so I was talking to the security man, and then she wanted to talk. That's rude." Pt speaking loudly, not perceived as aggressive at this time.

## 2014-05-16 NOTE — ED Notes (Signed)
Phlebotomy at the bedside  

## 2014-05-16 NOTE — ED Notes (Signed)
Patient continues to call staff to the room, then yell on their arrival. Security at the bedside, and staff explained to the patient that if he continues to yell at the staff, we cannot attend to his needs.  [employer] standing outside the door.

## 2014-05-16 NOTE — ED Notes (Addendum)
Gennette Pac., RN rounded on patient and provided blanket.

## 2014-05-16 NOTE — ED Notes (Signed)
Patient not cooperative in collecting VS at this time from pain.

## 2014-05-16 NOTE — ED Notes (Signed)
Pt upset, states that "this makes no sense!  Why can the doctor not see me and get me some medicine!  Come on, where is everybody at!"

## 2014-05-16 NOTE — ED Notes (Signed)
Attempted 2 IV starts, missed. 2nd RN.

## 2014-05-18 ENCOUNTER — Emergency Department (HOSPITAL_COMMUNITY)
Admission: EM | Admit: 2014-05-18 | Discharge: 2014-05-18 | Disposition: A | Discharge status: Home / Self Care | Payer: Self-pay | Attending: Emergency Medicine | Admitting: Emergency Medicine

## 2014-05-18 ENCOUNTER — Encounter (HOSPITAL_COMMUNITY): Payer: Self-pay | Admitting: Emergency Medicine

## 2014-05-18 ENCOUNTER — Emergency Department (HOSPITAL_COMMUNITY): Payer: No Typology Code available for payment source

## 2014-05-18 DIAGNOSIS — G8921 Chronic pain due to trauma: Secondary | ICD-10-CM | POA: Insufficient documentation

## 2014-05-18 DIAGNOSIS — Z9089 Acquired absence of other organs: Secondary | ICD-10-CM | POA: Insufficient documentation

## 2014-05-18 DIAGNOSIS — R112 Nausea with vomiting, unspecified: Secondary | ICD-10-CM | POA: Insufficient documentation

## 2014-05-18 DIAGNOSIS — Z872 Personal history of diseases of the skin and subcutaneous tissue: Secondary | ICD-10-CM | POA: Insufficient documentation

## 2014-05-18 DIAGNOSIS — G8929 Other chronic pain: Secondary | ICD-10-CM

## 2014-05-18 DIAGNOSIS — Z87828 Personal history of other (healed) physical injury and trauma: Secondary | ICD-10-CM | POA: Insufficient documentation

## 2014-05-18 DIAGNOSIS — Z87442 Personal history of urinary calculi: Secondary | ICD-10-CM | POA: Insufficient documentation

## 2014-05-18 DIAGNOSIS — F17200 Nicotine dependence, unspecified, uncomplicated: Secondary | ICD-10-CM | POA: Insufficient documentation

## 2014-05-18 DIAGNOSIS — Z8719 Personal history of other diseases of the digestive system: Secondary | ICD-10-CM | POA: Insufficient documentation

## 2014-05-18 DIAGNOSIS — R197 Diarrhea, unspecified: Secondary | ICD-10-CM | POA: Insufficient documentation

## 2014-05-18 DIAGNOSIS — R109 Unspecified abdominal pain: Secondary | ICD-10-CM | POA: Insufficient documentation

## 2014-05-18 DIAGNOSIS — N189 Chronic kidney disease, unspecified: Secondary | ICD-10-CM | POA: Insufficient documentation

## 2014-05-18 DIAGNOSIS — Z9889 Other specified postprocedural states: Secondary | ICD-10-CM | POA: Insufficient documentation

## 2014-05-18 LAB — I-STAT CHEM 8, ED
BUN: 13 mg/dL (ref 6–23)
Calcium, Ion: 1.19 mmol/L (ref 1.12–1.23)
Chloride: 107 mEq/L (ref 96–112)
Creatinine, Ser: 1 mg/dL (ref 0.50–1.35)
Glucose, Bld: 96 mg/dL (ref 70–99)
HCT: 51 % (ref 39.0–52.0)
Hemoglobin: 17.3 g/dL — ABNORMAL HIGH (ref 13.0–17.0)
Potassium: 3.8 mEq/L (ref 3.7–5.3)
Sodium: 138 mEq/L (ref 137–147)
TCO2: 22 mmol/L (ref 0–100)

## 2014-05-18 LAB — CBC WITH DIFFERENTIAL/PLATELET
Basophils Absolute: 0 10*3/uL (ref 0.0–0.1)
Basophils Relative: 0 % (ref 0–1)
Eosinophils Absolute: 0.1 10*3/uL (ref 0.0–0.7)
Eosinophils Relative: 1 % (ref 0–5)
HCT: 43.5 % (ref 39.0–52.0)
Hemoglobin: 15.4 g/dL (ref 13.0–17.0)
Lymphocytes Relative: 37 % (ref 12–46)
Lymphs Abs: 4.3 10*3/uL — ABNORMAL HIGH (ref 0.7–4.0)
MCH: 31.8 pg (ref 26.0–34.0)
MCHC: 35.4 g/dL (ref 30.0–36.0)
MCV: 89.7 fL (ref 78.0–100.0)
Monocytes Absolute: 0.8 10*3/uL (ref 0.1–1.0)
Monocytes Relative: 7 % (ref 3–12)
Neutro Abs: 6.5 10*3/uL (ref 1.7–7.7)
Neutrophils Relative %: 55 % (ref 43–77)
Platelets: 185 10*3/uL (ref 150–400)
RBC: 4.85 MIL/uL (ref 4.22–5.81)
RDW: 12.9 % (ref 11.5–15.5)
WBC: 11.7 10*3/uL — ABNORMAL HIGH (ref 4.0–10.5)

## 2014-05-18 MED ORDER — ACETAMINOPHEN-CODEINE #3 300-30 MG PO TABS
2.0000 | ORAL_TABLET | Freq: Once | ORAL | Status: AC
  Administered 2014-05-18: 2 via ORAL
  Filled 2014-05-18: qty 2

## 2014-05-18 MED ORDER — ACETAMINOPHEN-CODEINE #3 300-30 MG PO TABS: 1.0000 | ORAL_TABLET | Freq: Four times a day (QID) | ORAL | Status: DC | PRN

## 2014-05-18 NOTE — Discharge Instructions (Signed)

## 2014-05-18 NOTE — ED Notes (Signed)
C/o RLQ pain with nausea, vomiting (x 2 today), and diarrhea.  Pt seen here 6/13 for same.  States he is out of pain medication.

## 2014-05-18 NOTE — ED Notes (Signed)
Pt vomited after taking his tylenol.
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
## 2014-05-18 NOTE — ED Notes (Signed)
Spoke with x-ray stated patient ready for transport.

## 2014-05-19 ENCOUNTER — Emergency Department (HOSPITAL_COMMUNITY)
Admission: EM | Admit: 2014-05-19 | Discharge: 2014-05-19 | Disposition: A | Discharge status: Home / Self Care | Payer: Self-pay | Attending: Emergency Medicine | Admitting: Emergency Medicine

## 2014-05-19 ENCOUNTER — Encounter (HOSPITAL_COMMUNITY): Payer: Self-pay | Admitting: Emergency Medicine

## 2014-05-19 ENCOUNTER — Emergency Department (HOSPITAL_COMMUNITY): Payer: No Typology Code available for payment source

## 2014-05-19 DIAGNOSIS — R51 Headache: Secondary | ICD-10-CM | POA: Insufficient documentation

## 2014-05-19 DIAGNOSIS — Z87442 Personal history of urinary calculi: Secondary | ICD-10-CM | POA: Insufficient documentation

## 2014-05-19 DIAGNOSIS — Z9889 Other specified postprocedural states: Secondary | ICD-10-CM | POA: Insufficient documentation

## 2014-05-19 DIAGNOSIS — Z933 Colostomy status: Secondary | ICD-10-CM | POA: Insufficient documentation

## 2014-05-19 DIAGNOSIS — N390 Urinary tract infection, site not specified: Secondary | ICD-10-CM | POA: Insufficient documentation

## 2014-05-19 DIAGNOSIS — G8929 Other chronic pain: Secondary | ICD-10-CM | POA: Insufficient documentation

## 2014-05-19 DIAGNOSIS — Z9089 Acquired absence of other organs: Secondary | ICD-10-CM | POA: Insufficient documentation

## 2014-05-19 DIAGNOSIS — Z9189 Other specified personal risk factors, not elsewhere classified: Secondary | ICD-10-CM | POA: Insufficient documentation

## 2014-05-19 DIAGNOSIS — Z87828 Personal history of other (healed) physical injury and trauma: Secondary | ICD-10-CM | POA: Insufficient documentation

## 2014-05-19 DIAGNOSIS — Z8719 Personal history of other diseases of the digestive system: Secondary | ICD-10-CM | POA: Insufficient documentation

## 2014-05-19 DIAGNOSIS — E876 Hypokalemia: Secondary | ICD-10-CM

## 2014-05-19 DIAGNOSIS — F17200 Nicotine dependence, unspecified, uncomplicated: Secondary | ICD-10-CM | POA: Insufficient documentation

## 2014-05-19 LAB — CBC WITH DIFFERENTIAL/PLATELET
Basophils Absolute: 0 10*3/uL (ref 0.0–0.1)
Basophils Relative: 0 % (ref 0–1)
Eosinophils Absolute: 0.1 10*3/uL (ref 0.0–0.7)
Eosinophils Relative: 1 % (ref 0–5)
HCT: 33.6 % — ABNORMAL LOW (ref 39.0–52.0)
Hemoglobin: 12 g/dL — ABNORMAL LOW (ref 13.0–17.0)
Lymphocytes Relative: 39 % (ref 12–46)
Lymphs Abs: 2.9 10*3/uL (ref 0.7–4.0)
MCH: 31.7 pg (ref 26.0–34.0)
MCHC: 35.7 g/dL (ref 30.0–36.0)
MCV: 88.9 fL (ref 78.0–100.0)
Monocytes Absolute: 0.7 10*3/uL (ref 0.1–1.0)
Monocytes Relative: 10 % (ref 3–12)
Neutro Abs: 3.7 10*3/uL (ref 1.7–7.7)
Neutrophils Relative %: 50 % (ref 43–77)
Platelets: 183 10*3/uL (ref 150–400)
RBC: 3.78 MIL/uL — ABNORMAL LOW (ref 4.22–5.81)
RDW: 12.4 % (ref 11.5–15.5)
WBC: 7.4 10*3/uL (ref 4.0–10.5)

## 2014-05-19 LAB — COMPREHENSIVE METABOLIC PANEL
ALT: 14 U/L (ref 0–53)
AST: 20 U/L (ref 0–37)
Albumin: 3.4 g/dL — ABNORMAL LOW (ref 3.5–5.2)
Alkaline Phosphatase: 52 U/L (ref 39–117)
BUN: 13 mg/dL (ref 6–23)
CO2: 19 mEq/L (ref 19–32)
Calcium: 7.6 mg/dL — ABNORMAL LOW (ref 8.4–10.5)
Chloride: 107 mEq/L (ref 96–112)
Creatinine, Ser: 0.71 mg/dL (ref 0.50–1.35)
GFR calc Af Amer: >90 mL/min (ref >90)
GFR calc non Af Amer: >90 mL/min (ref >90)
Glucose, Bld: 93 mg/dL (ref 70–99)
Potassium: 3.1 mEq/L — ABNORMAL LOW (ref 3.7–5.3)
Sodium: 141 mEq/L (ref 137–147)
Total Bilirubin: 0.6 mg/dL (ref 0.3–1.2)
Total Protein: 6.2 g/dL (ref 6.0–8.3)

## 2014-05-19 LAB — URINALYSIS, ROUTINE W REFLEX MICROSCOPIC
Glucose, UA: NEGATIVE mg/dL
Ketones, ur: 15 mg/dL — AB
Nitrite: POSITIVE — AB
Protein, ur: 100 mg/dL — AB
Specific Gravity, Urine: 1.043 — ABNORMAL HIGH (ref 1.005–1.030)
Urobilinogen, UA: 1 mg/dL (ref 0.0–1.0)
pH: 5.5 (ref 5.0–8.0)

## 2014-05-19 LAB — URINE MICROSCOPIC-ADD ON

## 2014-05-19 LAB — LIPASE, BLOOD: Lipase: 19 U/L (ref 11–59)

## 2014-05-19 MED ORDER — ACETAMINOPHEN-CODEINE #3 300-30 MG PO TABS
2.0000 | ORAL_TABLET | Freq: Once | ORAL | Status: AC
  Administered 2014-05-19: 2 via ORAL
  Filled 2014-05-19: qty 2

## 2014-05-19 MED ORDER — SULFAMETHOXAZOLE-TRIMETHOPRIM 800-160 MG PO TABS: 1.0000 | ORAL_TABLET | Freq: Two times a day (BID) | ORAL | Status: DC

## 2014-05-19 MED ORDER — DEXTROSE 5 % IV SOLN
1.0000 g | Freq: Once | INTRAVENOUS | Status: AC
  Administered 2014-05-19: 1 g via INTRAVENOUS
  Filled 2014-05-19: qty 10

## 2014-05-19 MED ORDER — ONDANSETRON HCL 4 MG/2ML IJ SOLN
4.0000 mg | Freq: Once | INTRAMUSCULAR | Status: AC
  Administered 2014-05-19: 4 mg via INTRAVENOUS
  Filled 2014-05-19: qty 2

## 2014-05-19 MED ORDER — POTASSIUM CHLORIDE CRYS ER 20 MEQ PO TBCR
40.0000 meq | EXTENDED_RELEASE_TABLET | Freq: Once | ORAL | Status: AC
  Administered 2014-05-19: 40 meq via ORAL
  Filled 2014-05-19: qty 2

## 2014-05-19 MED ORDER — SODIUM CHLORIDE 0.9 % IV BOLUS (SEPSIS)
1000.0000 mL | Freq: Once | INTRAVENOUS | Status: AC
  Administered 2014-05-19: 1000 mL via INTRAVENOUS

## 2014-05-19 NOTE — ED Notes (Signed)
Called lab in regards to pt lab results

## 2014-05-19 NOTE — Discharge Instructions (Signed)
Potassium Content of Foods Potassium is a mineral found in many foods and drinks. It helps keep fluids and minerals balanced in your body and also affects how steadily your heart beats. The body needs potassium to control blood pressure and to keep the muscles and nervous system healthy. However, certain health conditions and medicine may require you to eat more or less potassium-rich foods and drinks. Your caregiver or dietitian will tell you how much potassium you should have each day. COMMON SERVING SIZES The list below tells you how big or small common portion sizes are:  1 oz.........4 stacked dice.  3 oz........Marland KitchenDeck of cards.  1 tsp.......Marland KitchenTip of little finger.  1 tbsp....Marland KitchenMarland KitchenThumb.  2 tbsp....Marland KitchenMarland KitchenGolf ball.   c..........Marland KitchenHalf of a fist.  1 c...........Marland KitchenA fist. FOODS AND DRINKS HIGH IN POTASSIUM More than 200 mg of potassium per serving. A serving size is  c (120 mL or noted gram weight) unless otherwise stated. While all the items on this list are high in potassium, some items are higher in potassium than others. Fruits  Apricots (sliced), 83 g.  Apricots (dried halves), 3 oz / 24 g.  Avocado (cubed),  c / 50 g.  Banana (sliced), 75 g.  Cantaloupe (cubed), 80 g.  Dates (pitted), 5 whole / 35 g.  Figs (dried), 4 whole / 32 g.  Guava, c / 55 g.  Honeydew, 1 wedge / 85 g.  Kiwi (sliced), 90 g.  Nectarine, 1 small / 129 g.  Orange, 1 medium / 131 g.  Orange juice.  Pomegranate seeds, 87 g.  Pomegranate juice.  Prunes (pitted), 3 whole / 30 g.  Prune juice, 3 oz / 90 mL.  Seedless raisins, 3 tbsp / 27 g. Vegetables  Artichoke,  of a medium / 64 g.  Asparagus (boiled), 90 g.  Baked beans,  c / 63 g.  Bamboo shoots,  c / 38 g.  Beets (cooked slices), 85 g.  Broccoli (boiled), 78 g.  Brussels sprout (boiled), 78 g.  Butternut squash (baked), 103 g.  Chickpea (cooked), 82 g.  Green peas (cooked), 80 g.  Hubbard squash (baked cubes),  c /  68 g.  Kidney beans (cooked), 5 tbsp / 55 g.  Lima beans (cooked),  c / 43 g.  Navy beans (cooked),  c / 61 g.  Potato (baked), 61 g.  Potato (boiled), 78 g.  Pumpkin (boiled), 123 g.  Refried beans,  c / 79 g.  Spinach (cooked),  c / 45 g.  Split peas (cooked),  c / 65 g.  Sun-dried tomatoes, 2 tbsp / 7 g.  Sweet potato (baked),  c / 50 g.  Tomato (chopped or sliced), 90 g.  Tomato juice.  Tomato paste, 4 tsp / 21 g.  Tomato sauce,  c / 61 g.  Vegetable juice.  White mushrooms (cooked), 78 g.  Yam (cooked or baked),  c / 34 g.  Zucchini squash (boiled), 90 g. Other Foods and Drinks  Almonds (whole),  c / 36 g.  Cashews (oil roasted),  c / 32 g.  Chocolate milk.  Chocolate pudding, 142 g.  Clams (steamed), 1.5 oz / 43 g.  Dark chocolate, 1.5 oz / 42 g.  Fish, 3 oz / 85 g.  King crab (steamed), 3 oz / 85 g.  Lobster (steamed), 4 oz / 113 g.  Milk (skim, 1%, 2%, whole), 1 c / 240 mL.  Milk chocolate, 2.3 oz / 66 g.  Milk shake.  Nonfat fruit  variety yogurt, 123 g.  Peanuts (oil roasted), 1 oz / 28 g.  Peanut butter, 2 tbsp / 32 g.  Pistachio nuts, 1 oz / 28 g.  Pumpkin seeds, 1 oz / 28 g.  Red meat (broiled, cooked, grilled), 3 oz / 85 g.  Scallops (steamed), 3 oz / 85 g.  Shredded wheat cereal (dry), 3 oblong biscuits / 75 g.  Spaghetti sauce,  c / 66 g.  Sunflower seeds (dry roasted), 1 oz / 28 g.  Veggie burger, 1 patty / 70 g. FOODS MODERATE IN POTASSIUM Between 150 mg and 200 mg per serving. A serving is  c (120 mL or noted gram weight) unless otherwise stated. Fruits  Grapefruit,  of the fruit / 123 g.  Grapefruit juice.  Pineapple juice.  Plums (sliced), 83 g.  Tangerine, 1 large / 120 g. Vegetables  Carrots (boiled), 78 g.  Carrots (sliced), 61 g.  Rhubarb (cooked with sugar), 120 g.  Rutabaga (cooked), 120 g.  Sweet corn (cooked), 75 g.  Yellow snap beans (cooked), 63 g. Other Foods and  Drinks   Bagel, 1 bagel / 98 g.  Chicken breast (roasted and chopped),  c / 70 g.  Chocolate ice cream / 66 g.  Pita bread, 1 large / 64 g.  Shrimp (steamed), 4 oz / 113 g.  Swiss cheese (diced), 70 g.  Vanilla ice cream, 66 g.  Vanilla pudding, 140 g. FOODS LOW IN POTASSIUM Less than 150 mg per serving. A serving size is  cup (120 mL or noted gram weight) unless otherwise stated. If you eat more than 1 serving of a food low in potassium, the food may be considered a food high in potassium. Fruits  Apple (slices), 55 g.  Apple juice.  Applesauce, 122 g.  Blackberries, 72 g.  Blueberries, 74 g.  Cranberries, 50 g.  Cranberry juice.  Fruit cocktail, 119 g.  Fruit punch.  Grapes, 46 g.  Grape juice.  Mandarin oranges (canned), 126 g.  Peach (slices), 77 g.  Pineapple (chunks), 83 g.  Raspberries, 62 g.  Red cherries (without pits), 78 g.  Strawberries (sliced), 83 g.  Watermelon (diced), 76 g. Vegetables  Alfalfa sprouts, 17 g.  Bell peppers (sliced), 46 g.  Cabbage (shredded), 35 g.  Cauliflower (boiled), 62 g.  Celery, 51 g.  Collard greens (boiled), 95 g.  Cucumber (sliced), 52 g.  Eggplant (cubed), 41 g.  Green beans (boiled), 63 g.  Lettuce (shredded), 1 c / 36 g.  Onions (sauteed), 44 g.  Radishes (sliced), 58 g.  Spaghetti squash, 51 g. Other Foods and Drinks  [employer], 1 slice / 28 g.  Black tea.  Brown rice (cooked), 98 g.  Butter croissant, 1 medium / 57 g.  Carbonated soda.  Coffee.  Cheddar cheese (diced), 66 g.  Corn flake cereal (dry), 14 g.  Cottage cheese, 118 g.  Cream of rice cereal (cooked), 122 g.  Cream of wheat cereal (cooked), 126 g.  Crisped rice cereal (dry), 14 g.  Egg (boiled, fried, poached, omelet, scrambled), 1 large / 46 61 g.  English muffin, 1 muffin / 57 g.  Frozen ice pop, 1 pop / 55 g.  Graham cracker, 1 large rectangular cracker / 14 g.  Jelly beans, 112  g.  Non-dairy whipped topping.  Oatmeal, 88 g.  Orange sherbet, 74 g.  Puffed rice cereal (dry), 7 g.  Pasta (cooked), 70 g.  Rice cakes, 4 cakes / 36  g.  Sugared doughnut, 4 oz / 116 g.  White bread, 1 slice / 30 g.  White rice (cooked), 79 93 g.  Wild rice (cooked), 82 g.  Yellow cake, 1 slice / 68 g. Document Released: 07/04/2005 Document Revised: 11/06/2012 Document Reviewed: 04/05/2012 [facility] Patient Information 2014 [place]. This, afternoon, on her second visit to the emergency department.  He was diagnosed with a number urinary tract infection.  You have  been given IV antibiotics in the emergency department.  You have been given a prescription for Septra, that she can take twice a day for the next, week.  You've also noted to have a slightly lower potassium on your second visit.  This is been supplemented with by mouth, potassium.  You've also been given a diet outline of foods that are high in potassium that, you can try to incorporate into your diet.  Over the next several days.  Please make an appointment with your primary care physician for further evaluation

## 2014-05-19 NOTE — ED Provider Notes (Signed)
Patient's labs were all within normal limits except for slightly low, potassium of 3.1.  This is been supplemented in the emergency department, 40 mEq, by mouth he's been given a diet outline of foods that are rich in potassium to incorporate into his, diet.  He's been given a prescription for Septra, that he can take 1 tablet twice a day for the next 7 days and been instructed to follow up with his primary care physician here.  He has a prescription for Tylenol No. 3, that he can continue to take for his chronic abdominal pain  Garald Balding, NP 05/19/14 [phone]
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
## 2014-05-21 ENCOUNTER — Other Ambulatory Visit: Payer: Self-pay

## 2014-05-21 ENCOUNTER — Telehealth: Payer: Self-pay | Admitting: Emergency Medicine

## 2014-05-21 ENCOUNTER — Encounter: Payer: Self-pay | Admitting: Emergency Medicine

## 2014-05-21 LAB — URINE CULTURE
Colony Count: NO GROWTH
Culture: NO GROWTH

## 2014-05-21 MED ORDER — ACETAMINOPHEN-CODEINE #3 300-30 MG PO TABS: 1.0000 | ORAL_TABLET | Freq: Four times a day (QID) | ORAL | Status: DC | PRN

## 2014-05-21 NOTE — Telephone Encounter (Signed)
Medication Tylenol #3 provided today #60, no refills  Pt informed to come to clinic only for refills and if he goes to ER we will no longer prescribe pain medications

## 2014-05-21 NOTE — Telephone Encounter (Signed)
Pt seen in clinic today by Dr.Jegede and prescribed Tylenol #3 60, no refills

## 2014-05-21 NOTE — Progress Notes (Unsigned)
Patient came into office today requesting refill on his tylenol #3 The Ed sent script to our pharmacy but we do no dispense narcotics Per Dr Bethann Berkshire the prescription as written qty 15

## 2014-05-22 NOTE — ED Provider Notes (Signed)
Medical screening examination/treatment/procedure(s) were performed by non-physician practitioner and as supervising physician I was immediately available for consultation/collaboration.   EKG Interpretation None        Ephraim Hamburger, MD 05/22/14 [phone]

## 2014-05-28 ENCOUNTER — Telehealth: Payer: Self-pay | Admitting: Internal Medicine
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
## 2014-06-23 ENCOUNTER — Other Ambulatory Visit: Payer: Self-pay | Admitting: Emergency Medicine

## 2014-06-23 MED ORDER — METOCLOPRAMIDE HCL 10 MG PO TABS: 10.0000 mg | ORAL_TABLET | Freq: Four times a day (QID) | ORAL | Status: DC

## 2014-06-23 MED ORDER — ACETAMINOPHEN-CODEINE #3 300-30 MG PO TABS: 1.0000 | ORAL_TABLET | Freq: Four times a day (QID) | ORAL | Status: DC | PRN
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
## 2014-07-24 ENCOUNTER — Other Ambulatory Visit: Payer: Self-pay | Admitting: Emergency Medicine

## 2014-07-24 MED ORDER — ACETAMINOPHEN-CODEINE #3 300-30 MG PO TABS: 1.0000 | ORAL_TABLET | Freq: Four times a day (QID) | ORAL | Status: DC | PRN

## 2014-07-24 NOTE — Progress Notes (Signed)
Pt came in today for medication refill Tylenol #3  Script given with bus pass
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
## 2014-08-23 ENCOUNTER — Emergency Department (HOSPITAL_COMMUNITY)
Admission: EM | Admit: 2014-08-23 | Discharge: 2014-08-24 | Disposition: A | Discharge status: Home / Self Care | Payer: Self-pay | Attending: Emergency Medicine | Admitting: Emergency Medicine

## 2014-08-23 ENCOUNTER — Encounter (HOSPITAL_COMMUNITY): Payer: Self-pay | Admitting: Emergency Medicine

## 2014-08-23 DIAGNOSIS — M549 Dorsalgia, unspecified: Secondary | ICD-10-CM | POA: Insufficient documentation

## 2014-08-23 DIAGNOSIS — Z872 Personal history of diseases of the skin and subcutaneous tissue: Secondary | ICD-10-CM | POA: Insufficient documentation

## 2014-08-23 DIAGNOSIS — G8929 Other chronic pain: Secondary | ICD-10-CM | POA: Insufficient documentation

## 2014-08-23 DIAGNOSIS — F17200 Nicotine dependence, unspecified, uncomplicated: Secondary | ICD-10-CM | POA: Insufficient documentation

## 2014-08-23 DIAGNOSIS — R112 Nausea with vomiting, unspecified: Secondary | ICD-10-CM | POA: Insufficient documentation

## 2014-08-23 DIAGNOSIS — R1031 Right lower quadrant pain: Secondary | ICD-10-CM | POA: Insufficient documentation

## 2014-08-23 DIAGNOSIS — Z8719 Personal history of other diseases of the digestive system: Secondary | ICD-10-CM | POA: Insufficient documentation

## 2014-08-23 DIAGNOSIS — Z79899 Other long term (current) drug therapy: Secondary | ICD-10-CM | POA: Insufficient documentation

## 2014-08-23 DIAGNOSIS — N189 Chronic kidney disease, unspecified: Secondary | ICD-10-CM | POA: Insufficient documentation

## 2014-08-23 DIAGNOSIS — R109 Unspecified abdominal pain: Secondary | ICD-10-CM

## 2014-08-23 DIAGNOSIS — Z87828 Personal history of other (healed) physical injury and trauma: Secondary | ICD-10-CM | POA: Insufficient documentation

## 2014-08-23 LAB — COMPREHENSIVE METABOLIC PANEL
ALT: 30 U/L (ref 0–53)
AST: 24 U/L (ref 0–37)
Albumin: 4.6 g/dL (ref 3.5–5.2)
Alkaline Phosphatase: 94 U/L (ref 39–117)
Anion gap: 17 — ABNORMAL HIGH (ref 5–15)
BUN: 10 mg/dL (ref 6–23)
CO2: 22 mEq/L (ref 19–32)
Calcium: 10.6 mg/dL — ABNORMAL HIGH (ref 8.4–10.5)
Chloride: 99 mEq/L (ref 96–112)
Creatinine, Ser: 1.15 mg/dL (ref 0.50–1.35)
GFR calc Af Amer: 84 mL/min — ABNORMAL LOW (ref >90)
GFR calc non Af Amer: 73 mL/min — ABNORMAL LOW (ref >90)
Glucose, Bld: 123 mg/dL — ABNORMAL HIGH (ref 70–99)
Potassium: 3.9 mEq/L (ref 3.7–5.3)
Sodium: 138 mEq/L (ref 137–147)
Total Bilirubin: 0.7 mg/dL (ref 0.3–1.2)
Total Protein: 8.9 g/dL — ABNORMAL HIGH (ref 6.0–8.3)

## 2014-08-23 LAB — CBC WITH DIFFERENTIAL/PLATELET
Basophils Absolute: 0 10*3/uL (ref 0.0–0.1)
Basophils Relative: 0 % (ref 0–1)
Eosinophils Absolute: 0.1 10*3/uL (ref 0.0–0.7)
Eosinophils Relative: 1 % (ref 0–5)
HCT: 44 % (ref 39.0–52.0)
Hemoglobin: 15.9 g/dL (ref 13.0–17.0)
Lymphocytes Relative: 38 % (ref 12–46)
Lymphs Abs: 4.1 10*3/uL — ABNORMAL HIGH (ref 0.7–4.0)
MCH: 32.1 pg (ref 26.0–34.0)
MCHC: 36.1 g/dL — ABNORMAL HIGH (ref 30.0–36.0)
MCV: 88.7 fL (ref 78.0–100.0)
Monocytes Absolute: 0.8 10*3/uL (ref 0.1–1.0)
Monocytes Relative: 8 % (ref 3–12)
Neutro Abs: 5.7 10*3/uL (ref 1.7–7.7)
Neutrophils Relative %: 53 % (ref 43–77)
Platelets: 204 10*3/uL (ref 150–400)
RBC: 4.96 MIL/uL (ref 4.22–5.81)
RDW: 13.2 % (ref 11.5–15.5)
WBC: 10.7 10*3/uL — ABNORMAL HIGH (ref 4.0–10.5)

## 2014-08-23 NOTE — ED Notes (Signed)
Pt arrived by [facility]  With back pain for years.  Worse tionight

## 2014-08-24 ENCOUNTER — Telehealth: Payer: Self-pay | Admitting: Emergency Medicine

## 2014-08-24 ENCOUNTER — Other Ambulatory Visit: Payer: Self-pay | Admitting: Emergency Medicine

## 2014-08-24 ENCOUNTER — Emergency Department (HOSPITAL_COMMUNITY): Payer: No Typology Code available for payment source

## 2014-08-24 LAB — URINALYSIS, ROUTINE W REFLEX MICROSCOPIC
Glucose, UA: NEGATIVE mg/dL
Hgb urine dipstick: NEGATIVE
Ketones, ur: 15 mg/dL — AB
Nitrite: POSITIVE — AB
Protein, ur: 30 mg/dL — AB
Specific Gravity, Urine: 1.036 — ABNORMAL HIGH (ref 1.005–1.030)
Urobilinogen, UA: 1 mg/dL (ref 0.0–1.0)
pH: 5.5 (ref 5.0–8.0)

## 2014-08-24 LAB — URINE MICROSCOPIC-ADD ON

## 2014-08-24 LAB — LIPASE, BLOOD: Lipase: 26 U/L (ref 11–59)

## 2014-08-24 MED ORDER — HYDROMORPHONE HCL 1 MG/ML IJ SOLN
2.0000 mg | Freq: Once | INTRAMUSCULAR | Status: AC
  Administered 2014-08-24: 2 mg via INTRAVENOUS
  Filled 2014-08-24: qty 2

## 2014-08-24 MED ORDER — OMEPRAZOLE 20 MG PO CPDR: 20.0000 mg | DELAYED_RELEASE_CAPSULE | Freq: Every day | ORAL | Status: DC

## 2014-08-24 MED ORDER — ONDANSETRON 4 MG PO TBDP
8.0000 mg | ORAL_TABLET | Freq: Once | ORAL | Status: AC
  Administered 2014-08-24: 8 mg via ORAL
  Filled 2014-08-24: qty 2

## 2014-08-24 MED ORDER — ACETAMINOPHEN-CODEINE #3 300-30 MG PO TABS: 1.0000 | ORAL_TABLET | Freq: Four times a day (QID) | ORAL | Status: DC | PRN

## 2014-08-24 MED ORDER — ONDANSETRON HCL 4 MG PO TABS: 4.0000 mg | ORAL_TABLET | Freq: Three times a day (TID) | ORAL | Status: DC | PRN
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
## 2014-08-24 NOTE — Discharge Instructions (Signed)
We saw you in the ER for the abdominal pain. All of our results are normal, including all labs and imaging. Kidney function is fine as well. We are not sure what is causing your abdominal pain, and recommend that you see your primary care doctor within 2-3 days for further evaluation. If your symptoms get worse, return to the ER. Take the pain meds and nausea meds as prescribed.   Abdominal Pain Many things can cause abdominal pain. Usually, abdominal pain is not caused by a disease and will improve without treatment. It can often be observed and treated at home. Your health care provider will do a physical exam and possibly order blood tests and X-rays to help determine the seriousness of your pain. However, in many cases, more time must pass before a clear cause of the pain can be found. Before that point, your health care provider may not know if you need more testing or further treatment. HOME CARE INSTRUCTIONS  Monitor your abdominal pain for any changes. The following actions may help to alleviate any discomfort you are experiencing:  Only take over-the-counter or prescription medicines as directed by your health care provider.  Do not take laxatives unless directed to do so by your health care provider.  Try a clear liquid diet (broth, tea, or water) as directed by your health care provider. Slowly move to a bland diet as tolerated. SEEK MEDICAL CARE IF:  You have unexplained abdominal pain.  You have abdominal pain associated with nausea or diarrhea.  You have pain when you urinate or have a bowel movement.  You experience abdominal pain that wakes you in the night.  You have abdominal pain that is worsened or improved by eating food.  You have abdominal pain that is worsened with eating fatty foods.  You have a fever. SEEK IMMEDIATE MEDICAL CARE IF:   Your pain does not go away within 2 hours.  You keep throwing up (vomiting).  Your pain is felt only in portions of the  abdomen, such as the right side or the left lower portion of the abdomen.  You pass bloody or black tarry stools. MAKE SURE YOU:  Understand these instructions.   Will watch your condition.   Will get help right away if you are not doing well or get worse.  Document Released: 08/30/2005 Document Revised: 11/25/2013 Document Reviewed: 07/30/2013 [facility] Patient Information 2015 [place], [place]. This information is not intended to replace advice given to you by your health care provider. Make sure you discuss any questions you have with your health care provider.  Chronic Pain Chronic pain can be defined as pain that is off and on and lasts for 3-6 months or longer. Many things cause chronic pain, which can make it difficult to make a diagnosis. There are many treatment options available for chronic pain. However, finding a treatment that works well for you may require trying various approaches until the right one is found. Many people benefit from a combination of two or more types of treatment to control their pain. SYMPTOMS  Chronic pain can occur anywhere in the body and can range from mild to very severe. Some types of chronic pain include:  Headache.  Low back pain.  Cancer pain.  Arthritis pain.  Neurogenic pain. This is pain resulting from damage to nerves. People with chronic pain may also have other symptoms such as:  Depression.  Anger.  Insomnia.  Anxiety. DIAGNOSIS  Your health care provider will help diagnose your  condition over time. In many cases, the initial focus will be on excluding possible conditions that could be causing the pain. Depending on your symptoms, your health care provider may order tests to diagnose your condition. Some of these tests may include:   Blood tests.   CT scan.   MRI.   X-rays.   Ultrasounds.   Nerve conduction studies.  You may need to see a specialist.  TREATMENT  Finding treatment that works well may take  time. You may be referred to a pain specialist. He or she may prescribe medicine or therapies, such as:   Mindful meditation or yoga.  Shots (injections) of numbing or pain-relieving medicines into the spine or area of pain.  Local electrical stimulation.  Acupuncture.   Massage therapy.   Aroma, color, light, or sound therapy.   Biofeedback.   Working with a physical therapist to keep from getting stiff.   Regular, gentle exercise.   Cognitive or behavioral therapy.   Group support.  Sometimes, surgery may be recommended.  HOME CARE INSTRUCTIONS   Take all medicines as directed by your health care provider.   Lessen stress in your life by relaxing and doing things such as listening to calming music.   Exercise or be active as directed by your health care provider.   Eat a healthy diet and include things such as vegetables, fruits, fish, and lean meats in your diet.   Keep all follow-up appointments with your health care provider.   Attend a support group with others suffering from chronic pain. SEEK MEDICAL CARE IF:   Your pain gets worse.   You develop a new pain that was not there before.   You cannot tolerate medicines given to you by your health care provider.   You have new symptoms since your last visit with your health care provider.  SEEK IMMEDIATE MEDICAL CARE IF:   You feel weak.   You have decreased sensation or numbness.   You lose control of bowel or bladder function.   Your pain suddenly gets much worse.   You develop shaking.  You develop chills.  You develop confusion.  You develop chest pain.  You develop shortness of breath.  MAKE SURE YOU:  Understand these instructions.  Will watch your condition.  Will get help right away if you are not doing well or get worse. Document Released: 08/12/2002 Document Revised: 07/23/2013 Document Reviewed: 05/16/2013 [facility] Patient Information 2015 [place], [place].  This information is not intended to replace advice given to you by your health care provider. Make sure you discuss any questions you have with your health care provider.

## 2014-08-24 NOTE — ED Notes (Signed)
MD at bedside. 

## 2014-08-24 NOTE — Telephone Encounter (Signed)
Pt comes in for medication refill Tylenol #3 for chronic abdominal pain Pt was seen in [facility] ER 08/23/14 and given #15 tablets Tylenol  Pt informed again, if he goes to ER again for pain medicine we will not refill medication Pt also given script Zofran 4 mg ODT

## 2014-09-23 ENCOUNTER — Other Ambulatory Visit: Payer: Self-pay | Admitting: Emergency Medicine

## 2014-09-23 MED ORDER — ACETAMINOPHEN-CODEINE #3 300-30 MG PO TABS: 1.0000 | ORAL_TABLET | Freq: Four times a day (QID) | ORAL | Status: DC | PRN
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
## 2014-10-15 ENCOUNTER — Ambulatory Visit: Payer: No Typology Code available for payment source | Attending: Internal Medicine | Admitting: Internal Medicine

## 2014-10-15 ENCOUNTER — Encounter: Payer: Self-pay | Admitting: Internal Medicine

## 2014-10-15 VITALS — BP 117/83 | HR 76 | Temp 98.7°F | Resp 16 | Ht 69.0 in | Wt 140.0 lb

## 2014-10-15 DIAGNOSIS — G8929 Other chronic pain: Secondary | ICD-10-CM

## 2014-10-15 DIAGNOSIS — R1011 Right upper quadrant pain: Secondary | ICD-10-CM | POA: Insufficient documentation

## 2014-10-15 DIAGNOSIS — R1010 Upper abdominal pain, unspecified: Secondary | ICD-10-CM

## 2014-10-15 MED ORDER — ACETAMINOPHEN-CODEINE #4 300-60 MG PO TABS: 1.0000 | ORAL_TABLET | Freq: Three times a day (TID) | ORAL | Status: DC | PRN
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
## 2014-10-15 NOTE — Patient Instructions (Signed)

## 2014-10-15 NOTE — Progress Notes (Signed)
Pt comes in to discuss alternative treatment for chronic right upper quad pain with episode of severe pain with vomiting  Last emesis yesterday after eating pizza States prescribed Tylenol #3 Describes pain as bloating followed by" colicky" grabbing pain Declined Flu vaccine

## 2014-10-23 ENCOUNTER — Emergency Department (HOSPITAL_COMMUNITY)
Admission: EM | Admit: 2014-10-23 | Discharge: 2014-10-23 | Disposition: A | Discharge status: Home / Self Care | Payer: No Typology Code available for payment source | Attending: Emergency Medicine | Admitting: Emergency Medicine

## 2014-10-23 ENCOUNTER — Encounter (HOSPITAL_COMMUNITY): Payer: Self-pay

## 2014-10-23 DIAGNOSIS — G8929 Other chronic pain: Secondary | ICD-10-CM

## 2014-10-23 DIAGNOSIS — Z886 Allergy status to analgesic agent status: Secondary | ICD-10-CM | POA: Insufficient documentation

## 2014-10-23 DIAGNOSIS — Z885 Allergy status to narcotic agent status: Secondary | ICD-10-CM | POA: Insufficient documentation

## 2014-10-23 DIAGNOSIS — R1031 Right lower quadrant pain: Secondary | ICD-10-CM | POA: Insufficient documentation

## 2014-10-23 DIAGNOSIS — Z888 Allergy status to other drugs, medicaments and biological substances status: Secondary | ICD-10-CM | POA: Insufficient documentation

## 2014-10-23 DIAGNOSIS — Z79891 Long term (current) use of opiate analgesic: Secondary | ICD-10-CM | POA: Insufficient documentation

## 2014-10-23 DIAGNOSIS — R109 Unspecified abdominal pain: Secondary | ICD-10-CM | POA: Insufficient documentation

## 2014-10-23 DIAGNOSIS — Z720 Tobacco use: Secondary | ICD-10-CM | POA: Insufficient documentation

## 2014-10-23 DIAGNOSIS — Z87442 Personal history of urinary calculi: Secondary | ICD-10-CM | POA: Insufficient documentation

## 2014-10-23 LAB — URINALYSIS, ROUTINE W REFLEX MICROSCOPIC
Bilirubin Urine: NEGATIVE
Glucose, UA: NEGATIVE mg/dL
Hgb urine dipstick: NEGATIVE
Ketones, ur: 15 mg/dL — AB
Leukocytes, UA: NEGATIVE
Nitrite: NEGATIVE
Protein, ur: NEGATIVE mg/dL
Specific Gravity, Urine: 1.041 — ABNORMAL HIGH (ref 1.005–1.030)
Urobilinogen, UA: 1 mg/dL (ref 0.0–1.0)
pH: 5 (ref 5.0–8.0)

## 2014-10-23 LAB — COMPREHENSIVE METABOLIC PANEL
ALT: 24 U/L (ref 0–53)
AST: 23 U/L (ref 0–37)
Albumin: 4 g/dL (ref 3.5–5.2)
Alkaline Phosphatase: 79 U/L (ref 39–117)
Anion gap: 14 (ref 5–15)
BUN: 8 mg/dL (ref 6–23)
CO2: 22 mEq/L (ref 19–32)
Calcium: 9.6 mg/dL (ref 8.4–10.5)
Chloride: 102 mEq/L (ref 96–112)
Creatinine, Ser: 0.89 mg/dL (ref 0.50–1.35)
GFR calc Af Amer: >90 mL/min (ref >90)
GFR calc non Af Amer: >90 mL/min (ref >90)
Glucose, Bld: 103 mg/dL — ABNORMAL HIGH (ref 70–99)
Potassium: 4.3 mEq/L (ref 3.7–5.3)
Sodium: 138 mEq/L (ref 137–147)
Total Bilirubin: 0.4 mg/dL (ref 0.3–1.2)
Total Protein: 7.7 g/dL (ref 6.0–8.3)

## 2014-10-23 LAB — CBC WITH DIFFERENTIAL/PLATELET
Basophils Absolute: 0 10*3/uL (ref 0.0–0.1)
Basophils Relative: 0 % (ref 0–1)
Eosinophils Absolute: 0.2 10*3/uL (ref 0.0–0.7)
Eosinophils Relative: 3 % (ref 0–5)
HCT: 40.3 % (ref 39.0–52.0)
Hemoglobin: 13.9 g/dL (ref 13.0–17.0)
Lymphocytes Relative: 40 % (ref 12–46)
Lymphs Abs: 3.7 10*3/uL (ref 0.7–4.0)
MCH: 31 pg (ref 26.0–34.0)
MCHC: 34.5 g/dL (ref 30.0–36.0)
MCV: 90 fL (ref 78.0–100.0)
Monocytes Absolute: 0.7 10*3/uL (ref 0.1–1.0)
Monocytes Relative: 7 % (ref 3–12)
Neutro Abs: 4.7 10*3/uL (ref 1.7–7.7)
Neutrophils Relative %: 50 % (ref 43–77)
Platelets: 209 10*3/uL (ref 150–400)
RBC: 4.48 MIL/uL (ref 4.22–5.81)
RDW: 13.6 % (ref 11.5–15.5)
WBC: 9.3 10*3/uL (ref 4.0–10.5)

## 2014-10-23 LAB — LIPASE, BLOOD: Lipase: 36 U/L (ref 11–59)

## 2014-10-23 MED ORDER — ACETAMINOPHEN-CODEINE #3 300-30 MG PO TABS: 1.0000 | ORAL_TABLET | Freq: Four times a day (QID) | ORAL | Status: DC | PRN

## 2014-10-23 MED ORDER — METOCLOPRAMIDE HCL 5 MG/ML IJ SOLN
10.0000 mg | Freq: Once | INTRAMUSCULAR | Status: AC
  Administered 2014-10-23: 10 mg via INTRAVENOUS
  Filled 2014-10-23: qty 2

## 2014-10-23 MED ORDER — HYDROMORPHONE HCL 1 MG/ML IJ SOLN
1.0000 mg | Freq: Once | INTRAMUSCULAR | Status: AC
  Administered 2014-10-23: 1 mg via INTRAVENOUS
  Filled 2014-10-23: qty 1

## 2014-10-23 MED ORDER — SODIUM CHLORIDE 0.9 % IV BOLUS (SEPSIS)
1000.0000 mL | Freq: Once | INTRAVENOUS | Status: AC
  Administered 2014-10-23: 1000 mL via INTRAVENOUS
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
## 2014-10-23 NOTE — Discharge Instructions (Signed)
Return here as needed.  Follow-up with your primary doctor. °

## 2014-10-27 ENCOUNTER — Emergency Department (HOSPITAL_COMMUNITY)
Admission: EM | Admit: 2014-10-27 | Discharge: 2014-10-28 | Disposition: A | Discharge status: Home / Self Care | Payer: No Typology Code available for payment source | Attending: Emergency Medicine | Admitting: Emergency Medicine

## 2014-10-27 ENCOUNTER — Encounter (HOSPITAL_COMMUNITY): Payer: Self-pay | Admitting: Family Medicine

## 2014-10-27 DIAGNOSIS — Z9889 Other specified postprocedural states: Secondary | ICD-10-CM | POA: Insufficient documentation

## 2014-10-27 DIAGNOSIS — Z8719 Personal history of other diseases of the digestive system: Secondary | ICD-10-CM | POA: Insufficient documentation

## 2014-10-27 DIAGNOSIS — G8929 Other chronic pain: Secondary | ICD-10-CM

## 2014-10-27 DIAGNOSIS — Z87442 Personal history of urinary calculi: Secondary | ICD-10-CM | POA: Insufficient documentation

## 2014-10-27 DIAGNOSIS — Z720 Tobacco use: Secondary | ICD-10-CM | POA: Insufficient documentation

## 2014-10-27 DIAGNOSIS — N189 Chronic kidney disease, unspecified: Secondary | ICD-10-CM | POA: Insufficient documentation

## 2014-10-27 DIAGNOSIS — Z872 Personal history of diseases of the skin and subcutaneous tissue: Secondary | ICD-10-CM | POA: Insufficient documentation

## 2014-10-27 DIAGNOSIS — Z9049 Acquired absence of other specified parts of digestive tract: Secondary | ICD-10-CM | POA: Insufficient documentation

## 2014-10-27 DIAGNOSIS — R52 Pain, unspecified: Secondary | ICD-10-CM

## 2014-10-27 DIAGNOSIS — R1011 Right upper quadrant pain: Secondary | ICD-10-CM | POA: Insufficient documentation

## 2014-10-27 DIAGNOSIS — Z8739 Personal history of other diseases of the musculoskeletal system and connective tissue: Secondary | ICD-10-CM | POA: Insufficient documentation

## 2014-10-27 DIAGNOSIS — E860 Dehydration: Secondary | ICD-10-CM

## 2014-10-27 LAB — I-STAT CG4 LACTIC ACID, ED: Lactic Acid, Venous: 2.07 mmol/L (ref 0.5–2.2)

## 2014-10-27 MED ORDER — SODIUM CHLORIDE 0.9 % IV BOLUS (SEPSIS)
1000.0000 mL | Freq: Once | INTRAVENOUS | Status: AC
  Administered 2014-10-28: 1000 mL via INTRAVENOUS

## 2014-10-27 MED ORDER — DICYCLOMINE HCL 10 MG/ML IM SOLN
20.0000 mg | Freq: Once | INTRAMUSCULAR | Status: AC
  Administered 2014-10-28: 20 mg via INTRAMUSCULAR
  Filled 2014-10-27: qty 2

## 2014-10-27 NOTE — ED Notes (Signed)
Bed: NG87 Expected date: 10/27/14 Expected time: 10:25 PM Means of arrival: Ambulance Comments: Abdominal pain
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
## 2014-10-28 ENCOUNTER — Encounter (HOSPITAL_COMMUNITY): Payer: Self-pay | Admitting: Emergency Medicine

## 2014-10-28 ENCOUNTER — Emergency Department (HOSPITAL_COMMUNITY)
Admission: EM | Admit: 2014-10-28 | Discharge: 2014-10-28 | Disposition: A | Discharge status: Home / Self Care | Payer: No Typology Code available for payment source | Attending: Emergency Medicine | Admitting: Emergency Medicine

## 2014-10-28 ENCOUNTER — Emergency Department (HOSPITAL_COMMUNITY): Payer: No Typology Code available for payment source

## 2014-10-28 DIAGNOSIS — Z87448 Personal history of other diseases of urinary system: Secondary | ICD-10-CM | POA: Insufficient documentation

## 2014-10-28 DIAGNOSIS — Z8719 Personal history of other diseases of the digestive system: Secondary | ICD-10-CM | POA: Insufficient documentation

## 2014-10-28 DIAGNOSIS — Z87828 Personal history of other (healed) physical injury and trauma: Secondary | ICD-10-CM | POA: Insufficient documentation

## 2014-10-28 DIAGNOSIS — R112 Nausea with vomiting, unspecified: Secondary | ICD-10-CM | POA: Insufficient documentation

## 2014-10-28 DIAGNOSIS — Z720 Tobacco use: Secondary | ICD-10-CM | POA: Insufficient documentation

## 2014-10-28 DIAGNOSIS — G8929 Other chronic pain: Secondary | ICD-10-CM | POA: Insufficient documentation

## 2014-10-28 DIAGNOSIS — Z872 Personal history of diseases of the skin and subcutaneous tissue: Secondary | ICD-10-CM | POA: Insufficient documentation

## 2014-10-28 DIAGNOSIS — R109 Unspecified abdominal pain: Secondary | ICD-10-CM

## 2014-10-28 LAB — LIPASE, BLOOD: Lipase: 20 U/L (ref 11–59)

## 2014-10-28 LAB — COMPREHENSIVE METABOLIC PANEL
ALT: 21 U/L (ref 0–53)
ALT: 17 U/L (ref 0–53)
AST: 23 U/L (ref 0–37)
AST: 30 U/L (ref 0–37)
Albumin: 4.5 g/dL (ref 3.5–5.2)
Albumin: 3.6 g/dL (ref 3.5–5.2)
Alkaline Phosphatase: 85 U/L (ref 39–117)
Alkaline Phosphatase: 68 U/L (ref 39–117)
Anion gap: 17 — ABNORMAL HIGH (ref 5–15)
Anion gap: 14 (ref 5–15)
BUN: 15 mg/dL (ref 6–23)
BUN: 10 mg/dL (ref 6–23)
CO2: 22 mEq/L (ref 19–32)
CO2: 20 mEq/L (ref 19–32)
Calcium: 10.2 mg/dL (ref 8.4–10.5)
Calcium: 8.9 mg/dL (ref 8.4–10.5)
Chloride: 95 mEq/L — ABNORMAL LOW (ref 96–112)
Chloride: 100 mEq/L (ref 96–112)
Creatinine, Ser: 1.45 mg/dL — ABNORMAL HIGH (ref 0.50–1.35)
Creatinine, Ser: 0.85 mg/dL (ref 0.50–1.35)
GFR calc Af Amer: 64 mL/min — ABNORMAL LOW (ref >90)
GFR calc Af Amer: >90 mL/min (ref >90)
GFR calc non Af Amer: 55 mL/min — ABNORMAL LOW (ref >90)
GFR calc non Af Amer: >90 mL/min (ref >90)
Glucose, Bld: 102 mg/dL — ABNORMAL HIGH (ref 70–99)
Glucose, Bld: 104 mg/dL — ABNORMAL HIGH (ref 70–99)
Potassium: 4 mEq/L (ref 3.7–5.3)
Potassium: 3.8 mEq/L (ref 3.7–5.3)
Sodium: 134 mEq/L — ABNORMAL LOW (ref 137–147)
Sodium: 134 mEq/L — ABNORMAL LOW (ref 137–147)
Total Bilirubin: 0.6 mg/dL (ref 0.3–1.2)
Total Bilirubin: 0.5 mg/dL (ref 0.3–1.2)
Total Protein: 8.5 g/dL — ABNORMAL HIGH (ref 6.0–8.3)
Total Protein: 6.7 g/dL (ref 6.0–8.3)

## 2014-10-28 LAB — URINALYSIS, ROUTINE W REFLEX MICROSCOPIC
Bilirubin Urine: NEGATIVE
Glucose, UA: NEGATIVE mg/dL
Glucose, UA: NEGATIVE mg/dL
Hgb urine dipstick: NEGATIVE
Ketones, ur: 15 mg/dL — AB
Ketones, ur: NEGATIVE mg/dL
Leukocytes, UA: NEGATIVE
Leukocytes, UA: NEGATIVE
Nitrite: NEGATIVE
Nitrite: NEGATIVE
Protein, ur: 30 mg/dL — AB
Protein, ur: NEGATIVE mg/dL
Specific Gravity, Urine: 1.026 (ref 1.005–1.030)
Specific Gravity, Urine: 1.035 — ABNORMAL HIGH (ref 1.005–1.030)
Urobilinogen, UA: 0.2 mg/dL (ref 0.0–1.0)
Urobilinogen, UA: 0.2 mg/dL (ref 0.0–1.0)
pH: 5.5 (ref 5.0–8.0)
pH: 5.5 (ref 5.0–8.0)

## 2014-10-28 LAB — CBC WITH DIFFERENTIAL/PLATELET
Basophils Absolute: 0 10*3/uL (ref 0.0–0.1)
Basophils Relative: 0 % (ref 0–1)
Eosinophils Absolute: 0.1 10*3/uL (ref 0.0–0.7)
Eosinophils Relative: 1 % (ref 0–5)
HCT: 45.3 % (ref 39.0–52.0)
Hemoglobin: 15.7 g/dL (ref 13.0–17.0)
Lymphocytes Relative: 23 % (ref 12–46)
Lymphs Abs: 2.5 10*3/uL (ref 0.7–4.0)
MCH: 31.8 pg (ref 26.0–34.0)
MCHC: 34.7 g/dL (ref 30.0–36.0)
MCV: 91.9 fL (ref 78.0–100.0)
Monocytes Absolute: 1.2 10*3/uL — ABNORMAL HIGH (ref 0.1–1.0)
Monocytes Relative: 11 % (ref 3–12)
Neutro Abs: 7.3 10*3/uL (ref 1.7–7.7)
Neutrophils Relative %: 65 % (ref 43–77)
Platelets: 261 10*3/uL (ref 150–400)
RBC: 4.93 MIL/uL (ref 4.22–5.81)
RDW: 13.8 % (ref 11.5–15.5)
WBC: 11.1 10*3/uL — ABNORMAL HIGH (ref 4.0–10.5)

## 2014-10-28 LAB — CBC
HCT: 36.3 % — ABNORMAL LOW (ref 39.0–52.0)
Hemoglobin: 12.5 g/dL — ABNORMAL LOW (ref 13.0–17.0)
MCH: 30.8 pg (ref 26.0–34.0)
MCHC: 34.4 g/dL (ref 30.0–36.0)
MCV: 89.4 fL (ref 78.0–100.0)
Platelets: 197 10*3/uL (ref 150–400)
RBC: 4.06 MIL/uL — ABNORMAL LOW (ref 4.22–5.81)
RDW: 13.4 % (ref 11.5–15.5)
WBC: 7.3 10*3/uL (ref 4.0–10.5)

## 2014-10-28 LAB — URINE MICROSCOPIC-ADD ON

## 2014-10-28 LAB — POC OCCULT BLOOD, ED: Fecal Occult Bld: NEGATIVE

## 2014-10-28 LAB — ETHANOL: Alcohol, Ethyl (B): <11 mg/dL (ref 0–11)

## 2014-10-28 MED ORDER — DICYCLOMINE HCL 20 MG PO TABS: 20.0000 mg | ORAL_TABLET | Freq: Four times a day (QID) | ORAL | Status: DC | PRN

## 2014-10-28 MED ORDER — DICYCLOMINE HCL 10 MG/ML IM SOLN
20.0000 mg | Freq: Once | INTRAMUSCULAR | Status: AC
  Administered 2014-10-28: 20 mg via INTRAMUSCULAR
  Filled 2014-10-28: qty 2

## 2014-10-28 MED ORDER — SODIUM CHLORIDE 0.9 % IV BOLUS (SEPSIS)
1000.0000 mL | Freq: Once | INTRAVENOUS | Status: AC
  Administered 2014-10-28: 1000 mL via INTRAVENOUS

## 2014-10-28 MED ORDER — DICYCLOMINE HCL 10 MG PO CAPS
10.0000 mg | ORAL_CAPSULE | Freq: Once | ORAL | Status: AC
  Administered 2014-10-28: 10 mg via ORAL
  Filled 2014-10-28: qty 1

## 2014-10-28 MED ORDER — ONDANSETRON HCL 4 MG/2ML IJ SOLN
4.0000 mg | INTRAMUSCULAR | Status: AC
  Administered 2014-10-28: 4 mg via INTRAVENOUS
  Filled 2014-10-28: qty 2

## 2014-10-28 MED ORDER — SODIUM CHLORIDE 0.9 % IV BOLUS (SEPSIS)
500.0000 mL | Freq: Once | INTRAVENOUS | Status: AC
  Administered 2014-10-28: 500 mL via INTRAVENOUS

## 2014-10-28 MED ORDER — ACETAMINOPHEN-CODEINE #3 300-30 MG PO TABS
1.0000 | ORAL_TABLET | Freq: Once | ORAL | Status: AC
  Administered 2014-10-28: 1 via ORAL
  Filled 2014-10-28: qty 1

## 2014-10-28 MED ORDER — ONDANSETRON HCL 4 MG PO TABS: 4.0000 mg | ORAL_TABLET | Freq: Three times a day (TID) | ORAL | Status: DC | PRN

## 2014-10-28 NOTE — Discharge Instructions (Signed)
Your workup today does not show any serious abnormalities other than dehydration.  You have received IV fluids here in the emergency department.  Please increase the amount of fluids you are drinking.  Take Bentyl and Zofran as prescribed.  Follow-up with your doctor.   Chronic Pain Chronic pain can be defined as pain that is off and on and lasts for 3-6 months or longer. Many things cause chronic pain, which can make it difficult to make a diagnosis. There are many treatment options available for chronic pain. However, finding a treatment that works well for you may require trying various approaches until the right one is found. Many people benefit from a combination of two or more types of treatment to control their pain. SYMPTOMS  Chronic pain can occur anywhere in the body and can range from mild to very severe. Some types of chronic pain include:  Headache.  Low back pain.  Cancer pain.  Arthritis pain.  Neurogenic pain. This is pain resulting from damage to nerves. People with chronic pain may also have other symptoms such as:  Depression.  Anger.  Insomnia.  Anxiety. DIAGNOSIS  Your health care provider will help diagnose your condition over time. In many cases, the initial focus will be on excluding possible conditions that could be causing the pain. Depending on your symptoms, your health care provider may order tests to diagnose your condition. Some of these tests may include:   Blood tests.   CT scan.   MRI.   X-rays.   Ultrasounds.   Nerve conduction studies.  You may need to see a specialist.  TREATMENT  Finding treatment that works well may take time. You may be referred to a pain specialist. He or she may prescribe medicine or therapies, such as:   Mindful meditation or yoga.  Shots (injections) of numbing or pain-relieving medicines into the spine or area of pain.  Local electrical stimulation.  Acupuncture.   Massage therapy.   Aroma,  color, light, or sound therapy.   Biofeedback.   Working with a physical therapist to keep from getting stiff.   Regular, gentle exercise.   Cognitive or behavioral therapy.   Group support.  Sometimes, surgery may be recommended.  HOME CARE INSTRUCTIONS   Take all medicines as directed by your health care provider.   Lessen stress in your life by relaxing and doing things such as listening to calming music.   Exercise or be active as directed by your health care provider.   Eat a healthy diet and include things such as vegetables, fruits, fish, and lean meats in your diet.   Keep all follow-up appointments with your health care provider.   Attend a support group with others suffering from chronic pain. SEEK MEDICAL CARE IF:   Your pain gets worse.   You develop a new pain that was not there before.   You cannot tolerate medicines given to you by your health care provider.   You have new symptoms since your last visit with your health care provider.  SEEK IMMEDIATE MEDICAL CARE IF:   You feel weak.   You have decreased sensation or numbness.   You lose control of bowel or bladder function.   Your pain suddenly gets much worse.   You develop shaking.  You develop chills.  You develop confusion.  You develop chest pain.  You develop shortness of breath.  MAKE SURE YOU:  Understand these instructions.  Will watch your condition.  Will get help  right away if you are not doing well or get worse. Document Released: 08/12/2002 Document Revised: 07/23/2013 Document Reviewed: 05/16/2013 [facility] Patient Information 2015 [place], [place]. This information is not intended to replace advice given to you by your health care provider. Make sure you discuss any questions you have with your health care provider.  Dehydration, Adult Dehydration is when you lose more fluids from the body than you take in. Vital organs like the kidneys, brain, and heart  cannot function without a proper amount of fluids and salt. Any loss of fluids from the body can cause dehydration.  CAUSES   Vomiting.  Diarrhea.  Excessive sweating.  Excessive urine output.  Fever. SYMPTOMS  Mild dehydration  Thirst.  Dry lips.  Slightly dry mouth. Moderate dehydration  Very dry mouth.  Sunken eyes.  Skin does not bounce back quickly when lightly pinched and released.  Dark urine and decreased urine production.  Decreased tear production.  Headache. Severe dehydration  Very dry mouth.  Extreme thirst.  Rapid, weak pulse (more than 100 beats per minute at rest).  Cold hands and feet.  Not able to sweat in spite of heat and temperature.  Rapid breathing.  Blue lips.  Confusion and lethargy.  Difficulty being awakened.  Minimal urine production.  No tears. DIAGNOSIS  Your caregiver will diagnose dehydration based on your symptoms and your exam. Blood and urine tests will help confirm the diagnosis. The diagnostic evaluation should also identify the cause of dehydration. TREATMENT  Treatment of mild or moderate dehydration can often be done at home by increasing the amount of fluids that you drink. It is best to drink small amounts of fluid more often. Drinking too much at one time can make vomiting worse. Refer to the home care instructions below. Severe dehydration needs to be treated at the hospital where you will probably be given intravenous (IV) fluids that contain water and electrolytes. HOME CARE INSTRUCTIONS   Ask your caregiver about specific rehydration instructions.  Drink enough fluids to keep your urine clear or pale yellow.  Drink small amounts frequently if you have nausea and vomiting.  Eat as you normally do.  Avoid:  Foods or drinks high in sugar.  Carbonated drinks.  Juice.  Extremely hot or cold fluids.  Drinks with caffeine.  Fatty, greasy foods.  Alcohol.  Tobacco.  Overeating.  Gelatin  desserts.  Wash your hands well to avoid spreading bacteria and viruses.  Only take over-the-counter or prescription medicines for pain, discomfort, or fever as directed by your caregiver.  Ask your caregiver if you should continue all prescribed and over-the-counter medicines.  Keep all follow-up appointments with your caregiver. SEEK MEDICAL CARE IF:  You have abdominal pain and it increases or stays in one area (localizes).  You have a rash, stiff neck, or severe headache.  You are irritable, sleepy, or difficult to awaken.  You are weak, dizzy, or extremely thirsty. SEEK IMMEDIATE MEDICAL CARE IF:   You are unable to keep fluids down or you get worse despite treatment.  You have frequent episodes of vomiting or diarrhea.  You have blood or green matter (bile) in your vomit.  You have blood in your stool or your stool looks black and tarry.  You have not urinated in 6 to 8 hours, or you have only urinated a small amount of very dark urine.  You have a fever.  You faint. MAKE SURE YOU:   Understand these instructions.  Will watch your condition.  Will get help right away if you are not doing well or get worse. Document Released: 11/20/2005 Document Revised: 02/12/2012 Document Reviewed: 07/10/2011 [facility] Patient Information 2015 [place], [place]. This information is not intended to replace advice given to you by your health care provider. Make sure you discuss any questions you have with your health care provider.  Rehydration, Adult Rehydration is the replacement of body fluids lost during dehydration. Dehydration is an extreme loss of body fluids to the point of body function impairment. There are many ways extreme fluid loss can occur, including vomiting, diarrhea, or excess sweating. Recovering from dehydration requires replacing lost fluids, continuing to eat to maintain strength, and avoiding foods and beverages that may contribute to further fluid loss or may  increase nausea. HOW TO REHYDRATE In most cases, rehydration involves the replacement of not only fluids but also carbohydrates and basic body salts. Rehydration with an oral rehydration solution is one way to replace essential nutrients lost through dehydration. An oral rehydration solution can be purchased at pharmacies, retail stores, and online. Premixed packets of powder that you combine with water to make a solution are also sold. You can prepare an oral rehydration solution at home by mixing the following ingredients together:    - tsp table salt.   tsp baking soda.   tsp salt substitute containing potassium chloride.  1 tablespoons sugar.  1 L (34 oz) of water. Be sure to use exact measurements. Including too much sugar can make diarrhea worse. Drink -1 cup (120-240 mL) of oral rehydration solution each time you have diarrhea or vomit. If drinking this amount makes your vomiting worse, try drinking smaller amounts more often. For example, drink 1-3 tsp every 5-10 minutes.  A general rule for staying hydrated is to drink 1-2 L of fluid per day. Talk to your caregiver about the specific amount you should be drinking each day. Drink enough fluids to keep your urine clear or pale yellow. EATING WHEN DEHYDRATED Even if you have had severe sweating or you are having diarrhea, do not stop eating. Many healthy items in a normal diet are okay to continue eating while recovering from dehydration. The following tips can help you to lessen nausea when you eat:  Ask someone else to prepare your food. Cooking smells may worsen nausea.  Eat in a well-ventilated room away from cooking smells.  Sit up when you eat. Avoid lying down until 1-2 hours after eating.  Eat small amounts when you eat.  Eat foods that are easy to digest. These include soft, well-cooked, or mashed foods. FOODS AND BEVERAGES TO AVOID Avoid eating or drinking the following foods and beverages that may increase nausea or  further loss of fluid:   Fruit juices with a high sugar content, such as concentrated juices.  Alcohol.  Beverages containing caffeine.  Carbonated drinks. They may cause a lot of gas.  Foods that may cause a lot of gas, such as cabbage, broccoli, and beans.  Fatty, greasy, and fried foods.  Spicy, very salty, and very sweet foods or drinks.  Foods or drinks that are very hot or very cold. Consume food or drinks at or near room temperature.  Foods that need a lot of chewing, such as raw vegetables.  Foods that are sticky or hard to swallow, such as peanut butter. Document Released: 02/12/2012 Document Revised: 08/14/2012 Document Reviewed: 02/12/2012 [facility] Patient Information 2015 [place], [place]. This information is not intended to replace advice given to you by your health  care provider. Make sure you discuss any questions you have with your health care provider.
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
## 2014-10-28 NOTE — ED Notes (Signed)
Patient resting quietly with no distress.

## 2014-10-28 NOTE — ED Notes (Signed)
Ultrasound at bedside
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
## 2014-10-28 NOTE — ED Notes (Signed)
Bladder scan reflexed 0 urine in bladder MD verified with ultrasound machine RN notifited

## 2014-10-28 NOTE — ED Notes (Signed)
Dansie, PA at bedside.
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
## 2014-10-28 NOTE — ED Notes (Signed)
PA at bedside.

## 2014-10-28 NOTE — Discharge Instructions (Signed)
Abdominal Pain Many things can cause abdominal pain. Usually, abdominal pain is not caused by a disease and will improve without treatment. It can often be observed and treated at home. Your health care provider will do a physical exam and possibly order blood tests and X-rays to help determine the seriousness of your pain. However, in many cases, more time must pass before a clear cause of the pain can be found. Before that point, your health care provider may not know if you need more testing or further treatment. HOME CARE INSTRUCTIONS  Monitor your abdominal pain for any changes. The following actions may help to alleviate any discomfort you are experiencing:  Only take over-the-counter or prescription medicines as directed by your health care provider.  Do not take laxatives unless directed to do so by your health care provider.  Try a clear liquid diet (broth, tea, or water) as directed by your health care provider. Slowly move to a bland diet as tolerated. SEEK MEDICAL CARE IF:  You have unexplained abdominal pain.  You have abdominal pain associated with nausea or diarrhea.  You have pain when you urinate or have a bowel movement.  You experience abdominal pain that wakes you in the night.  You have abdominal pain that is worsened or improved by eating food.  You have abdominal pain that is worsened with eating fatty foods.  You have a fever. SEEK IMMEDIATE MEDICAL CARE IF:   Your pain does not go away within 2 hours.  You keep throwing up (vomiting).  Your pain is felt only in portions of the abdomen, such as the right side or the left lower portion of the abdomen.  You pass bloody or black tarry stools. MAKE SURE YOU:  Understand these instructions.   Will watch your condition.   Will get help right away if you are not doing well or get worse.  Document Released: 08/30/2005 Document Revised: 11/25/2013 Document Reviewed: 07/30/2013 [facility] Patient Information  2015 [place], [place]. This information is not intended to replace advice given to you by your health care provider. Make sure you discuss any questions you have with your health care provider.   Emergency Department Resource Guide 1) Find a Doctor and Pay Out of Pocket Although you won't have to find out who is covered by your insurance plan, it is a good idea to ask around and get recommendations. You will then need to call the office and see if the doctor you have chosen will accept you as a new patient and what types of options they offer for patients who are self-pay. Some doctors offer discounts or will set up payment plans for their patients who do not have insurance, but you will need to ask so you aren't surprised when you get to your appointment.  2) Contact Your Local Health Department Not all health departments have doctors that can see patients for sick visits, but many do, so it is worth a call to see if yours does. If you don't know where your local health department is, you can check in your phone book. The CDC also has a tool to help you locate your state's health department, and many state websites also have listings of all of their local health departments.  3) Find a [facility] If your illness is not likely to be very severe or complicated, you may want to try a walk in clinic. These are popping up all over the country in pharmacies, drugstores, and shopping centers. They're  usually staffed by nurse practitioners or physician assistants that have been trained to treat common illnesses and complaints. They're usually fairly quick and inexpensive. However, if you have serious medical issues or chronic medical problems, these are probably not your best option.  No Primary Care Doctor: - Call Health Connect at  [phone] - they can help you locate a primary care doctor that  accepts your insurance, provides certain services, etc. - Physician Referral Service- [phone]  Chronic  Pain Problems: Organization         Address  Phone   Notes  [facility]  [phone] Patients need to be referred by their primary care doctor.   Medication Assistance: Organization         Address  Phone   Notes  [facility] Medication [facility] [address] [place], [place] [address] [phone] --Must be a resident of [facility] -- Must have NO insurance coverage whatsoever (no Medicaid/ Medicare, etc.) -- The pt. MUST have a primary care doctor that directs their care regularly and follows them in the community   Med[facility]ist  [phone]   [employer]  [phone]    Agencies that provide inexpensive medical care: Organization         Address  Phone   Notes  [place]  [phone]   [facility]    [phone]   [facility] [place], [place] [address] [phone]   [place] [address] [place] [phone]   Planned Parenthood    [phone]   [facility]    [phone]   [place] and [place] [address], [place] Phone:  [phone], Fax:  [phone] Hours of Operation:  9 am - 6 pm, M-F.  Also accepts Medicaid/Medicare and self-pay.  [facility] for [place], [address], [place] Phone: [phone], Fax: [phone]. Hours of Operation:  8:30 am - 5:30 pm, M-F.  Also accepts Medicaid and self-pay.  [facility] [address], [place] Phone: [phone]   [place], [place], [place] [phone], Ext. [phone] Mondays & Thursdays: 7-9 AM.  First 15 patients are seen on a first come, first serve basis.    [place] Providers:  Organization         Address  Phone   Notes  [facility] [address], [place] [phone] Also  accepts self-pay patients.  [facility] [id number] [place], [place]  [phone]   [place], [address], [place] [phone]   [facility] [address] [place] [phone]   Lucianne Lei [address], [place]   [phone] Only accepts [place] Access [place] patients after they have their name applied to their card.   Self-Pay (no insurance) in [facility]:  Organization         Address  Phone   Notes  Sickle Cell Patients, [facility] Internal Medicine [place] [phone]   [facility] [place] [phone]   [facility] Urgent [place]  [place], [address], [place] [phone]   [facility]/Dr. Osei-Bonsu  [address], [place] or [address], [place] [phone] Phone number for both [place] and [place] locations is the same.  [facility] [address], [place] [phone]   [facility] [address], [place] or [address] [place] [address] [phone]   [facility] [address] [place] [phone], phone; [phone], fax Sees patients 1st and 3rd Saturday of every month.  Must not qualify for public or private insurance (i.e. Medicaid, Medicare, [place] Health Choice, Veterans' Benefits)  Household income should be no more than 200% of the poverty level The clinic cannot treat you if you are pregnant or think you are pregnant  Sexually transmitted diseases are not treated at the clinic.    Dental Care: Organization         Address  Phone  Notes  [facility] Department of [facility] [place] [phone] Accepts children up to age 33 who are enrolled in [place] or [place]; pregnant  women with a Medicaid card; and children who have applied for Medicaid or [place] Health Choice, but were declined, whose parents can pay a reduced fee at time of service.  [facility] Department of [facility]  [address], [place] [phone] Accepts children up to age 48 who are enrolled in [place] or [place]; pregnant women with a Medicaid card; and children who have applied for Medicaid or [place] Health Choice, but were declined, whose parents can pay a reduced fee at time of service.  [place] Adult Dental Access PROGRAM  [place] [phone] Patients are seen by appointment only. Walk-ins are not accepted. [place] will see patients 42 years of age and older. Monday - Tuesday (8am-5pm) Most Wednesdays (8:30-5pm) $30 per visit, cash only  [facility] Adult Dental Access PROGRAM  [address], [facility] [phone] Patients are seen by appointment only. Walk-ins are not accepted. [place] will see patients 31 years of age and older. One Wednesday Evening (Monthly: Volunteer Based).  $30 per visit, cash only  [place]  [phone] for adults; Children under age 65, call Graduate Pediatric Dentistry at [phone]. Children aged 5-14, please call [phone] to request a pediatric application.  Dental services are provided in all areas of dental care including fillings, crowns and bridges, complete and partial dentures, implants, gum treatment, root canals, and extractions. Preventive care is also provided. Treatment is provided to both adults and children. Patients are selected via a lottery and there is often a waiting list.   [facility] [address], [place]  [phone] [email or web address]   [facility] [address] [place], [place] [phone], Ext. [phone] Second and Fourth Thursday of each month, opens at 6:30 AM; Clinic ends at 9 AM.  Patients are  seen on a first-come first-served basis, and a limited number are seen during each clinic.   [facility]  [address] Hillard Danker [place], [place] [phone]   Eligibility Requirements You must have lived in [place], [place], or [place] counties for at least the last three months.   You cannot be eligible for state or federal sponsored [employer], including [employer], [place], or [employer].   You generally cannot be eligible for healthcare insurance through  your employer.    How to apply: Eligibility screenings are held every Tuesday and Wednesday afternoon from 1:00 pm until 4:00 pm. You do not need an appointment for the interview!  [facility] [address] [place], [place]   [place]  [place] Department  [place]  [phone]    Behavioral Health Resources in the Community: Intensive Outpatient Programs Organization         Address  Phone  Notes  [place]. [address] [place], [place] [phone]   [facility] Outpatient [address] [place], [place]   ADS: Alcohol & Drug Svcs [address], [place], [place]   [place] [address],  [place], [place] or [phone]   Substance Abuse Resources Organization         Address  Phone  Notes  Alcohol and Drug Services  [phone]   [place]  [phone]   [place]   Chinita Pester  [phone]   Residential & Outpatient Substance Abuse Program  [phone]   Psychological Services Organization         Address  Phone  Notes  [facility] [place]  [place]  [phone]   [place] [address], [place] or [phone]    Mobile Crisis  Teams Organization         Address  Phone  Notes  [facility], Mobile Crisis Care Unit  [phone]   [facility]ive Psychotherapeutic Services  [address] [place], [place]   [place] [address], [place] [phone]    Self-Help/Support Groups Organization         Address  Phone             Notes  [place]. of [place] - variety of support groups  [place] Call for more information  Narcotics Anonymous (NA), [facility] [address], [employer] [place]  2 meetings at this location   Special educational needs teacher         Address  Phone  Notes  [facility] Residential Treatment [place],    [place]  [phone]   [facility]  [address], [place] [phone], [place], [place]   [place], [place] [phone] Admissions: 8am-3pm M-F  Incentives Substance [place] [address],    [place], [place] [phone]   [facility] [address] [place], [place], [place]   [facility] [address],  [place], [place]   [facility] Programs - Intensive Outpatient [address] Kristeen [address], [place], [place]   [facility].,  [place], [place] [phone] or [phone]   [facility]idential Treatment Services (RTS) [address] [place], [place] Accepts Medicaid  [facility] [place] [address],  [place] [phone] Substance Abuse/Addiction Treatment   [facility] Organization         Address  Phone  Notes  [facility]  [phone]   Domenic Schwab, PhD [address] [place], [place]   [phone] or [phone]   [place], [place] [phone]  [facility] [address] [place], [place] [phone]  Insurance/Medicaid/sponsorship through [employer] and Families [address], Ste [place], [place] [phone] [place], [place] [phone]    Dr. Adele Schilder  [phone]   [facility] Dept. 1) [address] [place] 2) [place] 3)  [place] [address], [place] [phone]  [phone]   [place] [phone] or [phone] (After Hours)

## 2014-10-28 NOTE — ED Notes (Signed)
Pt given ginger ale/cranberry drink upon discharge.

## 2014-10-30 LAB — URINE CULTURE
Colony Count: NO GROWTH
Culture: NO GROWTH

## 2014-12-17 ENCOUNTER — Other Ambulatory Visit: Payer: Self-pay | Admitting: Emergency Medicine

## 2014-12-17 MED ORDER — CYCLOBENZAPRINE HCL 10 MG PO TABS: 10.0000 mg | ORAL_TABLET | Freq: Three times a day (TID) | ORAL | Status: DC | PRN
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
## 2014-12-24 ENCOUNTER — Encounter (HOSPITAL_COMMUNITY): Payer: Self-pay | Admitting: *Deleted

## 2014-12-24 ENCOUNTER — Emergency Department (HOSPITAL_COMMUNITY)
Admission: EM | Admit: 2014-12-24 | Discharge: 2014-12-24 | Discharge status: Against Medical Advice | Payer: No Typology Code available for payment source | Attending: Emergency Medicine | Admitting: Emergency Medicine

## 2014-12-24 DIAGNOSIS — Z720 Tobacco use: Secondary | ICD-10-CM | POA: Insufficient documentation

## 2014-12-24 DIAGNOSIS — R1012 Left upper quadrant pain: Secondary | ICD-10-CM | POA: Insufficient documentation

## 2014-12-24 DIAGNOSIS — G8929 Other chronic pain: Secondary | ICD-10-CM | POA: Insufficient documentation

## 2014-12-24 DIAGNOSIS — N189 Chronic kidney disease, unspecified: Secondary | ICD-10-CM | POA: Insufficient documentation

## 2014-12-24 NOTE — ED Notes (Addendum)
Pt in via EMS to triage c/o LUQ abd pain since 4am today, reports n/v, no distress noted, VSS during transport- pt has history of chronic abd pain

## 2014-12-26 ENCOUNTER — Emergency Department (HOSPITAL_COMMUNITY)
Admission: EM | Admit: 2014-12-26 | Discharge: 2014-12-26 | Disposition: A | Discharge status: Home / Self Care | Payer: No Typology Code available for payment source | Attending: Emergency Medicine | Admitting: Emergency Medicine

## 2014-12-26 ENCOUNTER — Encounter (HOSPITAL_COMMUNITY): Payer: Self-pay | Admitting: Emergency Medicine

## 2014-12-26 ENCOUNTER — Emergency Department (HOSPITAL_COMMUNITY): Payer: No Typology Code available for payment source

## 2014-12-26 DIAGNOSIS — Z720 Tobacco use: Secondary | ICD-10-CM | POA: Insufficient documentation

## 2014-12-26 DIAGNOSIS — Z8739 Personal history of other diseases of the musculoskeletal system and connective tissue: Secondary | ICD-10-CM | POA: Insufficient documentation

## 2014-12-26 DIAGNOSIS — G8929 Other chronic pain: Secondary | ICD-10-CM | POA: Insufficient documentation

## 2014-12-26 DIAGNOSIS — Z872 Personal history of diseases of the skin and subcutaneous tissue: Secondary | ICD-10-CM | POA: Insufficient documentation

## 2014-12-26 DIAGNOSIS — Z87828 Personal history of other (healed) physical injury and trauma: Secondary | ICD-10-CM | POA: Insufficient documentation

## 2014-12-26 DIAGNOSIS — Z79899 Other long term (current) drug therapy: Secondary | ICD-10-CM | POA: Insufficient documentation

## 2014-12-26 DIAGNOSIS — N189 Chronic kidney disease, unspecified: Secondary | ICD-10-CM | POA: Insufficient documentation

## 2014-12-26 DIAGNOSIS — K5900 Constipation, unspecified: Secondary | ICD-10-CM | POA: Insufficient documentation

## 2014-12-26 DIAGNOSIS — Z87442 Personal history of urinary calculi: Secondary | ICD-10-CM | POA: Insufficient documentation

## 2014-12-26 MED ORDER — ONDANSETRON 4 MG PO TBDP
8.0000 mg | ORAL_TABLET | Freq: Once | ORAL | Status: AC
Start: 2014-12-26 — End: 2014-12-26
  Administered 2014-12-26: 8 mg via ORAL
  Filled 2014-12-26: qty 2

## 2014-12-26 MED ORDER — BISACODYL 10 MG RE SUPP
10.0000 mg | Freq: Once | RECTAL | Status: DC
  Filled 2014-12-26: qty 1

## 2014-12-26 MED ORDER — POLYETHYLENE GLYCOL 3350 17 G PO PACK
17.0000 g | PACK | Freq: Every day | ORAL | Status: DC
  Administered 2014-12-26: 17 g via ORAL
  Filled 2014-12-26: qty 1

## 2014-12-26 NOTE — Discharge Instructions (Signed)
Use MiraLAX, twice a day to help with constipation. It also helps to drink plenty of water, and eat foods with plenty of fiber in them.   Constipation Constipation is when a person has fewer than three bowel movements a week, has difficulty having a bowel movement, or has stools that are dry, hard, or larger than normal. As people grow older, constipation is more common. If you try to fix constipation with medicines that make you have a bowel movement (laxatives), the problem may get worse. Long-term laxative use may cause the muscles of the colon to become weak. A low-fiber diet, not taking in enough fluids, and taking certain medicines may make constipation worse.  CAUSES   Certain medicines, such as antidepressants, pain medicine, iron supplements, antacids, and water pills.   Certain diseases, such as diabetes, irritable bowel syndrome (IBS), thyroid disease, or depression.   Not drinking enough water.   Not eating enough fiber-rich foods.   Stress or travel.   Lack of physical activity or exercise.   Ignoring the urge to have a bowel movement.   Using laxatives too much.  SIGNS AND SYMPTOMS   Having fewer than three bowel movements a week.   Straining to have a bowel movement.   Having stools that are hard, dry, or larger than normal.   Feeling full or bloated.   Pain in the lower abdomen.   Not feeling relief after having a bowel movement.  DIAGNOSIS  Your health care provider will take a medical history and perform a physical exam. Further testing may be done for severe constipation. Some tests may include:  A barium enema X-ray to examine your rectum, colon, and, sometimes, your small intestine.   A sigmoidoscopy to examine your lower colon.   A colonoscopy to examine your entire colon. TREATMENT  Treatment will depend on the severity of your constipation and what is causing it. Some dietary treatments include drinking more fluids and eating more  fiber-rich foods. Lifestyle treatments may include regular exercise. If these diet and lifestyle recommendations do not help, your health care provider may recommend taking over-the-counter laxative medicines to help you have bowel movements. Prescription medicines may be prescribed if over-the-counter medicines do not work.  HOME CARE INSTRUCTIONS   Eat foods that have a lot of fiber, such as fruits, vegetables, whole grains, and beans.  Limit foods high in fat and processed sugars, such as french fries, hamburgers, cookies, candies, and soda.   A fiber supplement may be added to your diet if you cannot get enough fiber from foods.   Drink enough fluids to keep your urine clear or pale yellow.   Exercise regularly or as directed by your health care provider.   Go to the restroom when you have the urge to go. Do not hold it.   Only take over-the-counter or prescription medicines as directed by your health care provider. Do not take other medicines for constipation without talking to your health care provider first.  [place] IF:   You have bright red blood in your stool.   Your constipation lasts for more than 4 days or gets worse.   You have abdominal or rectal pain.   You have thin, pencil-like stools.   You have unexplained weight loss. MAKE SURE YOU:   Understand these instructions.  Will watch your condition.  Will get help right away if you are not doing well or get worse. Document Released: 08/18/2004 Document Revised: 11/25/2013 Document Reviewed: 09/01/2013  ExitCare® Patient Information ©2015 [employer]. This information is not intended to replace advice given to you by your health care provider. Make sure you discuss any questions you have with your health care provider. ° °
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
## 2015-01-25 ENCOUNTER — Emergency Department (HOSPITAL_COMMUNITY)
Admission: EM | Admit: 2015-01-25 | Discharge: 2015-01-25 | Disposition: A | Discharge status: Home / Self Care | Payer: Self-pay | Attending: Emergency Medicine | Admitting: Emergency Medicine

## 2015-01-25 ENCOUNTER — Encounter (HOSPITAL_COMMUNITY): Payer: Self-pay | Admitting: Emergency Medicine

## 2015-01-25 ENCOUNTER — Emergency Department (HOSPITAL_COMMUNITY): Payer: No Typology Code available for payment source

## 2015-01-25 DIAGNOSIS — Z872 Personal history of diseases of the skin and subcutaneous tissue: Secondary | ICD-10-CM | POA: Insufficient documentation

## 2015-01-25 DIAGNOSIS — R1011 Right upper quadrant pain: Secondary | ICD-10-CM | POA: Insufficient documentation

## 2015-01-25 DIAGNOSIS — R109 Unspecified abdominal pain: Secondary | ICD-10-CM

## 2015-01-25 DIAGNOSIS — G8929 Other chronic pain: Secondary | ICD-10-CM | POA: Insufficient documentation

## 2015-01-25 DIAGNOSIS — Z720 Tobacco use: Secondary | ICD-10-CM | POA: Insufficient documentation

## 2015-01-25 DIAGNOSIS — Z79899 Other long term (current) drug therapy: Secondary | ICD-10-CM | POA: Insufficient documentation

## 2015-01-25 DIAGNOSIS — N189 Chronic kidney disease, unspecified: Secondary | ICD-10-CM | POA: Insufficient documentation

## 2015-01-25 DIAGNOSIS — R112 Nausea with vomiting, unspecified: Secondary | ICD-10-CM | POA: Insufficient documentation

## 2015-01-25 DIAGNOSIS — Z8739 Personal history of other diseases of the musculoskeletal system and connective tissue: Secondary | ICD-10-CM | POA: Insufficient documentation

## 2015-01-25 DIAGNOSIS — Z9049 Acquired absence of other specified parts of digestive tract: Secondary | ICD-10-CM | POA: Insufficient documentation

## 2015-01-25 DIAGNOSIS — Z8719 Personal history of other diseases of the digestive system: Secondary | ICD-10-CM | POA: Insufficient documentation

## 2015-01-25 DIAGNOSIS — Z9889 Other specified postprocedural states: Secondary | ICD-10-CM | POA: Insufficient documentation

## 2015-01-25 DIAGNOSIS — Z87828 Personal history of other (healed) physical injury and trauma: Secondary | ICD-10-CM | POA: Insufficient documentation

## 2015-01-25 LAB — COMPREHENSIVE METABOLIC PANEL
ALT: 32 U/L (ref 0–53)
AST: 29 U/L (ref 0–37)
Albumin: 4.4 g/dL (ref 3.5–5.2)
Alkaline Phosphatase: 88 U/L (ref 39–117)
Anion gap: 8 (ref 5–15)
BUN: 12 mg/dL (ref 6–23)
CO2: 22 mmol/L (ref 19–32)
Calcium: 9.8 mg/dL (ref 8.4–10.5)
Chloride: 106 mmol/L (ref 96–112)
Creatinine, Ser: 0.99 mg/dL (ref 0.50–1.35)
GFR calc Af Amer: >90 mL/min (ref >90)
GFR calc non Af Amer: >90 mL/min (ref >90)
Glucose, Bld: 109 mg/dL — ABNORMAL HIGH (ref 70–99)
Potassium: 4.2 mmol/L (ref 3.5–5.1)
Sodium: 136 mmol/L (ref 135–145)
Total Bilirubin: 0.7 mg/dL (ref 0.3–1.2)
Total Protein: 8.5 g/dL — ABNORMAL HIGH (ref 6.0–8.3)

## 2015-01-25 LAB — CBC WITH DIFFERENTIAL/PLATELET
Basophils Absolute: 0 10*3/uL (ref 0.0–0.1)
Basophils Relative: 0 % (ref 0–1)
Eosinophils Absolute: 0.1 10*3/uL (ref 0.0–0.7)
Eosinophils Relative: 1 % (ref 0–5)
HCT: 44.6 % (ref 39.0–52.0)
Hemoglobin: 15.6 g/dL (ref 13.0–17.0)
Lymphocytes Relative: 10 % — ABNORMAL LOW (ref 12–46)
Lymphs Abs: 0.7 10*3/uL (ref 0.7–4.0)
MCH: 31.5 pg (ref 26.0–34.0)
MCHC: 35 g/dL (ref 30.0–36.0)
MCV: 90.1 fL (ref 78.0–100.0)
Monocytes Absolute: 0.4 10*3/uL (ref 0.1–1.0)
Monocytes Relative: 5 % (ref 3–12)
Neutro Abs: 6.3 10*3/uL (ref 1.7–7.7)
Neutrophils Relative %: 84 % — ABNORMAL HIGH (ref 43–77)
Platelets: 225 10*3/uL (ref 150–400)
RBC: 4.95 MIL/uL (ref 4.22–5.81)
RDW: 14 % (ref 11.5–15.5)
WBC: 7.6 10*3/uL (ref 4.0–10.5)

## 2015-01-25 LAB — LIPASE, BLOOD: Lipase: 28 U/L (ref 11–59)

## 2015-01-25 MED ORDER — OXYCODONE-ACETAMINOPHEN 5-325 MG PO TABS
1.0000 | ORAL_TABLET | Freq: Once | ORAL | Status: AC
  Administered 2015-01-25: 1 via ORAL
  Filled 2015-01-25: qty 1

## 2015-01-25 MED ORDER — DICYCLOMINE HCL 20 MG PO TABS: 20.0000 mg | ORAL_TABLET | Freq: Two times a day (BID) | ORAL | Status: DC

## 2015-01-25 MED ORDER — DICYCLOMINE HCL 10 MG/ML IM SOLN
20.0000 mg | Freq: Once | INTRAMUSCULAR | Status: AC
  Administered 2015-01-25: 20 mg via INTRAMUSCULAR
  Filled 2015-01-25: qty 2

## 2015-01-25 MED ORDER — GI COCKTAIL ~~LOC~~
30.0000 mL | Freq: Once | ORAL | Status: AC
Start: 2015-01-25 — End: 2015-01-25
  Administered 2015-01-25: 30 mL via ORAL
  Filled 2015-01-25: qty 30

## 2015-01-25 MED ORDER — MAGNESIUM CITRATE PO SOLN
1.0000 | Freq: Once | ORAL | Status: AC
  Administered 2015-01-25: 1 via ORAL
  Filled 2015-01-25: qty 296

## 2015-01-25 MED ORDER — ONDANSETRON 4 MG PO TBDP
8.0000 mg | ORAL_TABLET | Freq: Once | ORAL | Status: AC
Start: 2015-01-25 — End: 2015-01-25
  Administered 2015-01-25: 8 mg via ORAL
  Filled 2015-01-25: qty 2

## 2015-01-25 MED ORDER — ONDANSETRON HCL 4 MG PO TABS: 4.0000 mg | ORAL_TABLET | Freq: Four times a day (QID) | ORAL | Status: DC

## 2015-01-25 NOTE — ED Notes (Signed)
Patient given gi cocktail, and cranberry juice as PO challenge. Patient is currently calm now that he has received juice.
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
## 2015-01-25 NOTE — ED Notes (Signed)
Patient quietly laying in bed. No apparent distress.

## 2015-01-25 NOTE — Discharge Instructions (Signed)
Take zofran as prescribed as needed for nausea. Take bentyl for abdominal pain. Follow up with your doctor tomorrow as scheduled.    Abdominal Pain Many things can cause abdominal pain. Usually, abdominal pain is not caused by a disease and will improve without treatment. It can often be observed and treated at home. Your health care provider will do a physical exam and possibly order blood tests and X-rays to help determine the seriousness of your pain. However, in many cases, more time must pass before a clear cause of the pain can be found. Before that point, your health care provider may not know if you need more testing or further treatment. HOME CARE INSTRUCTIONS  Monitor your abdominal pain for any changes. The following actions may help to alleviate any discomfort you are experiencing:  Only take over-the-counter or prescription medicines as directed by your health care provider.  Do not take laxatives unless directed to do so by your health care provider.  Try a clear liquid diet (broth, tea, or water) as directed by your health care provider. Slowly move to a bland diet as tolerated. SEEK MEDICAL CARE IF:  You have unexplained abdominal pain.  You have abdominal pain associated with nausea or diarrhea.  You have pain when you urinate or have a bowel movement.  You experience abdominal pain that wakes you in the night.  You have abdominal pain that is worsened or improved by eating food.  You have abdominal pain that is worsened with eating fatty foods.  You have a fever. SEEK IMMEDIATE MEDICAL CARE IF:   Your pain does not go away within 2 hours.  You keep throwing up (vomiting).  Your pain is felt only in portions of the abdomen, such as the right side or the left lower portion of the abdomen.  You pass bloody or black tarry stools. MAKE SURE YOU:  Understand these instructions.   Will watch your condition.   Will get help right away if you are not doing well  or get worse.  Document Released: 08/30/2005 Document Revised: 11/25/2013 Document Reviewed: 07/30/2013 [facility] Patient Information 2015 [place], [place]. This information is not intended to replace advice given to you by your health care provider. Make sure you discuss any questions you have with your health care provider.

## 2015-01-25 NOTE — ED Notes (Signed)
Patient still yelling, now reporting "all i need is crushed ice". Discussed plan of care with Tatyana, PA.  PO challenge with gi cocktail.

## 2015-01-25 NOTE — ED Notes (Signed)
Patient moaning in pain.  
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
## 2015-01-25 NOTE — ED Notes (Signed)
Discussed plan of care with [place], PA. Mag citrate ordered, and preparing for discharge.

## 2015-01-28 ENCOUNTER — Other Ambulatory Visit: Payer: Self-pay | Admitting: Emergency Medicine

## 2015-01-28 MED ORDER — ACETAMINOPHEN-CODEINE #3 300-30 MG PO TABS: 1.0000 | ORAL_TABLET | Freq: Four times a day (QID) | ORAL | Status: DC | PRN

## 2015-01-28 MED ORDER — TRAMADOL HCL 50 MG PO TABS: 50.0000 mg | ORAL_TABLET | Freq: Two times a day (BID) | ORAL | Status: DC | PRN

## 2015-01-28 MED ORDER — DOCUSATE SODIUM 100 MG PO CAPS: 100.0000 mg | ORAL_CAPSULE | Freq: Two times a day (BID) | ORAL | Status: DC

## 2015-01-28 MED ORDER — CYCLOBENZAPRINE HCL 10 MG PO TABS: 10.0000 mg | ORAL_TABLET | Freq: Three times a day (TID) | ORAL | Status: DC | PRN

## 2015-03-02 ENCOUNTER — Encounter (HOSPITAL_COMMUNITY): Payer: Self-pay | Admitting: *Deleted

## 2015-03-02 ENCOUNTER — Emergency Department (HOSPITAL_COMMUNITY)
Admission: EM | Admit: 2015-03-02 | Discharge: 2015-03-03 | Disposition: A | Discharge status: Home / Self Care | Payer: No Typology Code available for payment source | Attending: Emergency Medicine | Admitting: Emergency Medicine

## 2015-03-02 DIAGNOSIS — R1011 Right upper quadrant pain: Secondary | ICD-10-CM | POA: Insufficient documentation

## 2015-03-02 DIAGNOSIS — N189 Chronic kidney disease, unspecified: Secondary | ICD-10-CM | POA: Insufficient documentation

## 2015-03-02 DIAGNOSIS — G8929 Other chronic pain: Secondary | ICD-10-CM | POA: Insufficient documentation

## 2015-03-02 DIAGNOSIS — Z79899 Other long term (current) drug therapy: Secondary | ICD-10-CM | POA: Insufficient documentation

## 2015-03-02 DIAGNOSIS — Z87442 Personal history of urinary calculi: Secondary | ICD-10-CM | POA: Insufficient documentation

## 2015-03-02 DIAGNOSIS — Z720 Tobacco use: Secondary | ICD-10-CM | POA: Insufficient documentation

## 2015-03-02 DIAGNOSIS — Z8739 Personal history of other diseases of the musculoskeletal system and connective tissue: Secondary | ICD-10-CM | POA: Insufficient documentation

## 2015-03-02 DIAGNOSIS — Z872 Personal history of diseases of the skin and subcutaneous tissue: Secondary | ICD-10-CM | POA: Insufficient documentation

## 2015-03-02 DIAGNOSIS — R1013 Epigastric pain: Secondary | ICD-10-CM | POA: Insufficient documentation

## 2015-03-02 DIAGNOSIS — Z87828 Personal history of other (healed) physical injury and trauma: Secondary | ICD-10-CM | POA: Insufficient documentation

## 2015-03-02 DIAGNOSIS — Z9049 Acquired absence of other specified parts of digestive tract: Secondary | ICD-10-CM | POA: Insufficient documentation

## 2015-03-02 DIAGNOSIS — R112 Nausea with vomiting, unspecified: Secondary | ICD-10-CM | POA: Insufficient documentation

## 2015-03-02 DIAGNOSIS — R1031 Right lower quadrant pain: Secondary | ICD-10-CM | POA: Insufficient documentation

## 2015-03-02 DIAGNOSIS — Z9889 Other specified postprocedural states: Secondary | ICD-10-CM | POA: Insufficient documentation

## 2015-03-02 LAB — URINALYSIS, ROUTINE W REFLEX MICROSCOPIC
Glucose, UA: NEGATIVE mg/dL
Hgb urine dipstick: NEGATIVE
Ketones, ur: NEGATIVE mg/dL
Nitrite: NEGATIVE
Protein, ur: 30 mg/dL — AB
Specific Gravity, Urine: 1.044 — ABNORMAL HIGH (ref 1.005–1.030)
Urobilinogen, UA: 0.2 mg/dL (ref 0.0–1.0)
pH: 5.5 (ref 5.0–8.0)

## 2015-03-02 LAB — URINE MICROSCOPIC-ADD ON

## 2015-03-02 MED ORDER — ONDANSETRON 4 MG PO TBDP: 4.0000 mg | ORAL_TABLET | Freq: Three times a day (TID) | ORAL | Status: DC | PRN

## 2015-03-02 MED ORDER — SODIUM CHLORIDE 0.9 % IV BOLUS (SEPSIS)
1000.0000 mL | Freq: Once | INTRAVENOUS | Status: AC
Start: 2015-03-02 — End: 2015-03-03
  Administered 2015-03-02: 1000 mL via INTRAVENOUS

## 2015-03-02 MED ORDER — ONDANSETRON HCL 4 MG/2ML IJ SOLN
4.0000 mg | Freq: Once | INTRAMUSCULAR | Status: AC
  Administered 2015-03-02: 4 mg via INTRAVENOUS
  Filled 2015-03-02: qty 2

## 2015-03-02 MED ORDER — LORAZEPAM 2 MG/ML IJ SOLN
1.0000 mg | Freq: Once | INTRAMUSCULAR | Status: AC
  Administered 2015-03-02: 1 mg via INTRAVENOUS
  Filled 2015-03-02: qty 1

## 2015-03-02 MED ORDER — TRAMADOL HCL 50 MG PO TABS
50.0000 mg | ORAL_TABLET | Freq: Once | ORAL | Status: AC
  Administered 2015-03-02: 50 mg via ORAL
  Filled 2015-03-02: qty 1

## 2015-03-02 MED ORDER — PANTOPRAZOLE SODIUM 40 MG PO TBEC
40.0000 mg | DELAYED_RELEASE_TABLET | Freq: Once | ORAL | Status: AC
  Administered 2015-03-02: 40 mg via ORAL
  Filled 2015-03-02: qty 1

## 2015-03-02 NOTE — ED Notes (Signed)
Bed: [facility] Expected date:  Expected time:  Means of arrival:  Comments: EMS-abdominal pain 
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
## 2015-03-02 NOTE — ED Notes (Addendum)
Unable to obtain labs at this time. 
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
## 2015-03-02 NOTE — ED Notes (Addendum)
Arterial stick attempted x2. Another RT will attempt arterial stick. [place],PA notified

## 2015-03-02 NOTE — ED Notes (Signed)
Attempted to draw blood x2. Phlebotomist in the lab notified to obtain blood. Dewitt Hoes, [place]  notified.
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
## 2015-03-02 NOTE — ED Notes (Signed)
I attempted to stick patient and was unsuccessful .  I made the nurse aware.

## 2015-03-03 NOTE — Discharge Instructions (Signed)
Take zofran as directed. Stay well hydrated. Follow up with your doctor in 1 week. Return to the ER for changes or worsening in your symptoms which are different than your chronic pain.  Abdominal (belly) pain can be caused by many things. Your caregiver performed an examination and possibly ordered blood/urine tests and imaging (CT scan, x-rays, ultrasound). Many cases can be observed and treated at home after initial evaluation in the emergency department. Even though you are being discharged home, abdominal pain can be unpredictable. Therefore, you need a repeated exam if your pain does not resolve, returns, or worsens. Most patients with abdominal pain don't have to be admitted to the hospital or have surgery, but serious problems like appendicitis and gallbladder attacks can start out as nonspecific pain. Many abdominal conditions cannot be diagnosed in one visit, so follow-up evaluations are very important. SEEK IMMEDIATE MEDICAL ATTENTION IF YOU DEVELOP ANY OF THE FOLLOWING SYMPTOMS:  The pain does not go away or becomes severe.   A temperature above 101 develops.   Repeated vomiting occurs (multiple episodes).   The pain becomes localized to portions of the abdomen. The right side could possibly be appendicitis. In an adult, the left lower portion of the abdomen could be colitis or diverticulitis.   Blood is being passed in stools or vomit (bright red or black tarry stools).   Return also if you develop chest pain, difficulty breathing, dizziness or fainting, or become confused, poorly responsive, or inconsolable (young children).  The constipation stays for more than 4 days.   There is belly (abdominal) or rectal pain.   You do not seem to be getting better.     Abdominal Pain Many things can cause belly (abdominal) pain. Most times, the belly pain is not dangerous. Many cases of belly pain can be watched and treated at home. HOME CARE   Do not take medicines that help you go  poop (laxatives) unless told to by your doctor.  Only take medicine as told by your doctor.  Eat or drink as told by your doctor. Your doctor will tell you if you should be on a special diet. GET HELP IF:  You do not know what is causing your belly pain.  You have belly pain while you are sick to your stomach (nauseous) or have runny poop (diarrhea).  You have pain while you pee or poop.  Your belly pain wakes you up at night.  You have belly pain that gets worse or better when you eat.  You have belly pain that gets worse when you eat fatty foods.  You have a fever. GET HELP RIGHT AWAY IF:   The pain does not go away within 2 hours.  You keep throwing up (vomiting).  The pain changes and is only in the right or left part of the belly.  You have bloody or tarry looking poop. MAKE SURE YOU:   Understand these instructions.  Will watch your condition.  Will get help right away if you are not doing well or get worse. Document Released: 05/08/2008 Document Revised: 11/25/2013 Document Reviewed: 07/30/2013 [facility] Patient Information 2015 [place], [place]. This information is not intended to replace advice given to you by your health care provider. Make sure you discuss any questions you have with your health care provider.  Nausea and Vomiting Nausea means you feel sick to your stomach. Throwing up (vomiting) is a reflex where stomach contents come out of your mouth. HOME CARE   Take medicine as  told by your doctor.  Do not force yourself to eat. However, you do need to drink fluids.  If you feel like eating, eat a normal diet as told by your doctor.  Eat rice, wheat, potatoes, bread, lean meats, yogurt, fruits, and vegetables.  Avoid high-fat foods.  Drink enough fluids to keep your pee (urine) clear or pale yellow.  Ask your doctor how to replace body fluid losses (rehydrate). Signs of body fluid loss (dehydration) include:  Feeling very thirsty.  Dry lips and  mouth.  Feeling dizzy.  Dark pee.  Peeing less than normal.  Feeling confused.  Fast breathing or heart rate. GET HELP RIGHT AWAY IF:   You have blood in your throw up.  You have black or bloody poop (stool).  You have a bad headache or stiff neck.  You feel confused.  You have bad belly (abdominal) pain.  You have chest pain or trouble breathing.  You do not pee at least once every 8 hours.  You have cold, clammy skin.  You keep throwing up after 24 to 48 hours.  You have a fever. MAKE SURE YOU:   Understand these instructions.  Will watch your condition.  Will get help right away if you are not doing well or get worse. Document Released: 05/08/2008 Document Revised: 02/12/2012 Document Reviewed: 04/21/2011 [facility] Patient Information 2015 [place], [place]. This information is not intended to replace advice given to you by your health care provider. Make sure you discuss any questions you have with your health care provider.

## 2015-03-04 LAB — URINE CULTURE
Colony Count: NO GROWTH
Culture: NO GROWTH
Special Requests: NORMAL

## 2015-03-05 ENCOUNTER — Other Ambulatory Visit: Payer: Self-pay | Admitting: Emergency Medicine

## 2015-03-05 ENCOUNTER — Emergency Department (HOSPITAL_COMMUNITY)
Admission: EM | Admit: 2015-03-05 | Discharge: 2015-03-05 | Disposition: A | Discharge status: Home / Self Care | Payer: Self-pay | Attending: Emergency Medicine | Admitting: Emergency Medicine

## 2015-03-05 ENCOUNTER — Emergency Department (HOSPITAL_COMMUNITY): Payer: No Typology Code available for payment source

## 2015-03-05 ENCOUNTER — Encounter (HOSPITAL_COMMUNITY): Payer: Self-pay

## 2015-03-05 DIAGNOSIS — Z8719 Personal history of other diseases of the digestive system: Secondary | ICD-10-CM | POA: Insufficient documentation

## 2015-03-05 DIAGNOSIS — Z79899 Other long term (current) drug therapy: Secondary | ICD-10-CM | POA: Insufficient documentation

## 2015-03-05 DIAGNOSIS — R1031 Right lower quadrant pain: Secondary | ICD-10-CM | POA: Insufficient documentation

## 2015-03-05 DIAGNOSIS — Z9089 Acquired absence of other organs: Secondary | ICD-10-CM | POA: Insufficient documentation

## 2015-03-05 DIAGNOSIS — Z720 Tobacco use: Secondary | ICD-10-CM | POA: Insufficient documentation

## 2015-03-05 DIAGNOSIS — Z87442 Personal history of urinary calculi: Secondary | ICD-10-CM | POA: Insufficient documentation

## 2015-03-05 DIAGNOSIS — N189 Chronic kidney disease, unspecified: Secondary | ICD-10-CM | POA: Insufficient documentation

## 2015-03-05 DIAGNOSIS — Z9889 Other specified postprocedural states: Secondary | ICD-10-CM | POA: Insufficient documentation

## 2015-03-05 DIAGNOSIS — Z872 Personal history of diseases of the skin and subcutaneous tissue: Secondary | ICD-10-CM | POA: Insufficient documentation

## 2015-03-05 DIAGNOSIS — Z8739 Personal history of other diseases of the musculoskeletal system and connective tissue: Secondary | ICD-10-CM | POA: Insufficient documentation

## 2015-03-05 DIAGNOSIS — R109 Unspecified abdominal pain: Secondary | ICD-10-CM

## 2015-03-05 DIAGNOSIS — Z87828 Personal history of other (healed) physical injury and trauma: Secondary | ICD-10-CM | POA: Insufficient documentation

## 2015-03-05 DIAGNOSIS — G8929 Other chronic pain: Secondary | ICD-10-CM | POA: Insufficient documentation

## 2015-03-05 DIAGNOSIS — R197 Diarrhea, unspecified: Secondary | ICD-10-CM | POA: Insufficient documentation

## 2015-03-05 LAB — CBC WITH DIFFERENTIAL/PLATELET
Basophils Absolute: 0 10*3/uL (ref 0.0–0.1)
Basophils Relative: 0 % (ref 0–1)
Eosinophils Absolute: 0 10*3/uL (ref 0.0–0.7)
Eosinophils Relative: 0 % (ref 0–5)
HCT: 42.7 % (ref 39.0–52.0)
Hemoglobin: 14.7 g/dL (ref 13.0–17.0)
Lymphocytes Relative: 25 % (ref 12–46)
Lymphs Abs: 3.1 10*3/uL (ref 0.7–4.0)
MCH: 31.5 pg (ref 26.0–34.0)
MCHC: 34.4 g/dL (ref 30.0–36.0)
MCV: 91.4 fL (ref 78.0–100.0)
Monocytes Absolute: 1.2 10*3/uL — ABNORMAL HIGH (ref 0.1–1.0)
Monocytes Relative: 10 % (ref 3–12)
Neutro Abs: 8.1 10*3/uL — ABNORMAL HIGH (ref 1.7–7.7)
Neutrophils Relative %: 65 % (ref 43–77)
Platelets: 270 10*3/uL (ref 150–400)
RBC: 4.67 MIL/uL (ref 4.22–5.81)
RDW: 13.3 % (ref 11.5–15.5)
WBC: 12.4 10*3/uL — ABNORMAL HIGH (ref 4.0–10.5)

## 2015-03-05 LAB — LIPASE, BLOOD: Lipase: 18 U/L (ref 11–59)

## 2015-03-05 LAB — I-STAT CG4 LACTIC ACID, ED: Lactic Acid, Venous: 2 mmol/L (ref 0.5–2.0)

## 2015-03-05 LAB — COMPREHENSIVE METABOLIC PANEL
ALT: 19 U/L (ref 0–53)
AST: 29 U/L (ref 0–37)
Albumin: 4.1 g/dL (ref 3.5–5.2)
Alkaline Phosphatase: 96 U/L (ref 39–117)
Anion gap: 11 (ref 5–15)
BUN: 18 mg/dL (ref 6–23)
CO2: 24 mmol/L (ref 19–32)
Calcium: 9.4 mg/dL (ref 8.4–10.5)
Chloride: 98 mmol/L (ref 96–112)
Creatinine, Ser: 1.14 mg/dL (ref 0.50–1.35)
GFR calc Af Amer: 84 mL/min — ABNORMAL LOW (ref >90)
GFR calc non Af Amer: 73 mL/min — ABNORMAL LOW (ref >90)
Glucose, Bld: 98 mg/dL (ref 70–99)
Potassium: 3.5 mmol/L (ref 3.5–5.1)
Sodium: 133 mmol/L — ABNORMAL LOW (ref 135–145)
Total Bilirubin: 0.7 mg/dL (ref 0.3–1.2)
Total Protein: 8.1 g/dL (ref 6.0–8.3)

## 2015-03-05 MED ORDER — SODIUM CHLORIDE 0.9 % IV BOLUS (SEPSIS)
1000.0000 mL | Freq: Once | INTRAVENOUS | Status: AC
  Administered 2015-03-05: 1000 mL via INTRAVENOUS

## 2015-03-05 MED ORDER — HYDROMORPHONE HCL 1 MG/ML IJ SOLN
1.0000 mg | Freq: Once | INTRAMUSCULAR | Status: AC
  Administered 2015-03-05: 1 mg via INTRAVENOUS
  Filled 2015-03-05: qty 1

## 2015-03-05 MED ORDER — CYCLOBENZAPRINE HCL 10 MG PO TABS: 10.0000 mg | ORAL_TABLET | Freq: Three times a day (TID) | ORAL | Status: DC | PRN

## 2015-03-05 MED ORDER — ONDANSETRON HCL 4 MG/2ML IJ SOLN
4.0000 mg | Freq: Once | INTRAMUSCULAR | Status: AC
  Administered 2015-03-05: 4 mg via INTRAVENOUS
  Filled 2015-03-05: qty 2

## 2015-03-05 MED ORDER — TRAMADOL HCL 50 MG PO TABS: 50.0000 mg | ORAL_TABLET | Freq: Two times a day (BID) | ORAL | Status: DC | PRN
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
## 2015-03-05 NOTE — ED Notes (Signed)
Abdominal cramping and diarrhea for 3 days.    WAs seen at [facility], but is not feeling any better, denies any n/v.  Does feel lightheaded and dizzy.  GCS 15. Skin is warm and dry.  Denies any fevers, is having chills.
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
## 2015-04-19 ENCOUNTER — Telehealth: Payer: Self-pay | Admitting: Internal Medicine

## 2015-04-19 NOTE — Telephone Encounter (Signed)
Patient has come in today to ask for medication refills on multiple medications; patient usually goes through the nurse and hasn't been seen since November; please contact patient directly if this is going to be an issue;

## 2015-04-20 ENCOUNTER — Encounter (HOSPITAL_COMMUNITY): Payer: Self-pay | Admitting: *Deleted

## 2015-04-20 ENCOUNTER — Emergency Department (HOSPITAL_COMMUNITY): Payer: No Typology Code available for payment source

## 2015-04-20 ENCOUNTER — Emergency Department (HOSPITAL_COMMUNITY)
Admission: EM | Admit: 2015-04-20 | Discharge: 2015-04-20 | Disposition: A | Discharge status: Home / Self Care | Payer: Self-pay | Attending: Emergency Medicine | Admitting: Emergency Medicine

## 2015-04-20 DIAGNOSIS — Z79899 Other long term (current) drug therapy: Secondary | ICD-10-CM | POA: Insufficient documentation

## 2015-04-20 DIAGNOSIS — G8929 Other chronic pain: Secondary | ICD-10-CM | POA: Insufficient documentation

## 2015-04-20 DIAGNOSIS — Z872 Personal history of diseases of the skin and subcutaneous tissue: Secondary | ICD-10-CM | POA: Insufficient documentation

## 2015-04-20 DIAGNOSIS — Z87828 Personal history of other (healed) physical injury and trauma: Secondary | ICD-10-CM | POA: Insufficient documentation

## 2015-04-20 DIAGNOSIS — R1084 Generalized abdominal pain: Secondary | ICD-10-CM | POA: Insufficient documentation

## 2015-04-20 DIAGNOSIS — R1031 Right lower quadrant pain: Secondary | ICD-10-CM | POA: Insufficient documentation

## 2015-04-20 DIAGNOSIS — R1011 Right upper quadrant pain: Secondary | ICD-10-CM | POA: Insufficient documentation

## 2015-04-20 DIAGNOSIS — N189 Chronic kidney disease, unspecified: Secondary | ICD-10-CM | POA: Insufficient documentation

## 2015-04-20 DIAGNOSIS — Z8719 Personal history of other diseases of the digestive system: Secondary | ICD-10-CM | POA: Insufficient documentation

## 2015-04-20 DIAGNOSIS — Z720 Tobacco use: Secondary | ICD-10-CM | POA: Insufficient documentation

## 2015-04-20 LAB — COMPREHENSIVE METABOLIC PANEL
ALT: 15 U/L — ABNORMAL LOW (ref 17–63)
AST: 21 U/L (ref 15–41)
Albumin: 4 g/dL (ref 3.5–5.0)
Alkaline Phosphatase: 68 U/L (ref 38–126)
Anion gap: 10 (ref 5–15)
BUN: 8 mg/dL (ref 6–20)
CO2: 22 mmol/L (ref 22–32)
Calcium: 9.8 mg/dL (ref 8.9–10.3)
Chloride: 109 mmol/L (ref 101–111)
Creatinine, Ser: 0.95 mg/dL (ref 0.61–1.24)
GFR calc Af Amer: >60 mL/min (ref >60)
GFR calc non Af Amer: >60 mL/min (ref >60)
Glucose, Bld: 106 mg/dL — ABNORMAL HIGH (ref 65–99)
Potassium: 4.2 mmol/L (ref 3.5–5.1)
Sodium: 141 mmol/L (ref 135–145)
Total Bilirubin: 0.8 mg/dL (ref 0.3–1.2)
Total Protein: 7.6 g/dL (ref 6.5–8.1)

## 2015-04-20 LAB — CBC WITH DIFFERENTIAL/PLATELET
Basophils Absolute: 0 10*3/uL (ref 0.0–0.1)
Basophils Relative: 1 % (ref 0–1)
Eosinophils Absolute: 0 10*3/uL (ref 0.0–0.7)
Eosinophils Relative: 0 % (ref 0–5)
HCT: 40.8 % (ref 39.0–52.0)
Hemoglobin: 14.4 g/dL (ref 13.0–17.0)
Lymphocytes Relative: 35 % (ref 12–46)
Lymphs Abs: 3 10*3/uL (ref 0.7–4.0)
MCH: 31.6 pg (ref 26.0–34.0)
MCHC: 35.3 g/dL (ref 30.0–36.0)
MCV: 89.7 fL (ref 78.0–100.0)
Monocytes Absolute: 0.6 10*3/uL (ref 0.1–1.0)
Monocytes Relative: 7 % (ref 3–12)
Neutro Abs: 4.9 10*3/uL (ref 1.7–7.7)
Neutrophils Relative %: 57 % (ref 43–77)
Platelets: 217 10*3/uL (ref 150–400)
RBC: 4.55 MIL/uL (ref 4.22–5.81)
RDW: 13.1 % (ref 11.5–15.5)
WBC: 8.7 10*3/uL (ref 4.0–10.5)

## 2015-04-20 LAB — LIPASE, BLOOD: Lipase: 21 U/L — ABNORMAL LOW (ref 22–51)

## 2015-04-20 MED ORDER — DICYCLOMINE HCL 10 MG/ML IM SOLN
20.0000 mg | Freq: Once | INTRAMUSCULAR | Status: AC
  Administered 2015-04-20: 20 mg via INTRAMUSCULAR
  Filled 2015-04-20: qty 2

## 2015-04-20 MED ORDER — SODIUM CHLORIDE 0.9 % IV BOLUS (SEPSIS)
500.0000 mL | Freq: Once | INTRAVENOUS | Status: AC
  Administered 2015-04-20: 500 mL via INTRAVENOUS

## 2015-04-20 MED ORDER — FENTANYL CITRATE (PF) 100 MCG/2ML IJ SOLN
25.0000 ug | Freq: Once | INTRAMUSCULAR | Status: AC
  Administered 2015-04-20: 25 ug via INTRAVENOUS
  Filled 2015-04-20: qty 2

## 2015-04-20 MED ORDER — KETOROLAC TROMETHAMINE 30 MG/ML IJ SOLN
30.0000 mg | Freq: Once | INTRAMUSCULAR | Status: AC
  Administered 2015-04-20: 30 mg via INTRAVENOUS
  Filled 2015-04-20: qty 1

## 2015-04-20 NOTE — ED Notes (Addendum)
Pt moaning in pain, upside down in the bed.  PA aware.  Pt also asking for ice chips. Pt informed not to eat or drink at this time.
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
## 2015-04-20 NOTE — Discharge Instructions (Signed)

## 2015-04-20 NOTE — ED Notes (Signed)
Patient transported to X-ray 

## 2015-04-20 NOTE — ED Notes (Signed)
Pt presents via [facility] c/o RLQ abdominal pain radiating to his back beginning yesterday.  Pt yelling on arrival in pain, moving all around in bed.  Pt a x 4.

## 2015-04-20 NOTE — ED Notes (Signed)
PA at bedside.

## 2015-04-21 NOTE — Telephone Encounter (Signed)
Patient has come in today to ask for medication refills on multiple medications; patient usually goes through the nurse and hasn't been seen since November; please contact patient directly if this is going to be an issue;

## 2015-04-22 NOTE — Telephone Encounter (Signed)
Pt following up on medication refill request, please f/u with pt.

## 2015-04-23 ENCOUNTER — Encounter: Payer: Self-pay | Admitting: Family Medicine

## 2015-04-23 ENCOUNTER — Ambulatory Visit: Payer: No Typology Code available for payment source | Attending: Family Medicine | Admitting: Family Medicine

## 2015-04-23 VITALS — BP 132/87 | HR 81 | Temp 98.0°F | Resp 18 | Ht 69.0 in | Wt 139.0 lb

## 2015-04-23 DIAGNOSIS — R1031 Right lower quadrant pain: Secondary | ICD-10-CM | POA: Insufficient documentation

## 2015-04-23 DIAGNOSIS — R140 Abdominal distension (gaseous): Secondary | ICD-10-CM | POA: Insufficient documentation

## 2015-04-23 DIAGNOSIS — G8929 Other chronic pain: Secondary | ICD-10-CM | POA: Insufficient documentation

## 2015-04-23 MED ORDER — DOCUSATE SODIUM 100 MG PO CAPS: 100.0000 mg | ORAL_CAPSULE | Freq: Two times a day (BID) | ORAL | Status: DC

## 2015-04-23 MED ORDER — CYCLOBENZAPRINE HCL 10 MG PO TABS: 10.0000 mg | ORAL_TABLET | Freq: Three times a day (TID) | ORAL | Status: DC | PRN

## 2015-04-23 MED ORDER — ONDANSETRON 4 MG PO TBDP: 4.0000 mg | ORAL_TABLET | Freq: Three times a day (TID) | ORAL | Status: DC | PRN

## 2015-04-23 MED ORDER — TRAMADOL HCL 50 MG PO TABS: 50.0000 mg | ORAL_TABLET | Freq: Two times a day (BID) | ORAL | Status: DC | PRN

## 2015-04-23 MED ORDER — OMEPRAZOLE 20 MG PO CPDR: 20.0000 mg | DELAYED_RELEASE_CAPSULE | Freq: Every day | ORAL | Status: DC
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
## 2015-05-06 ENCOUNTER — Ambulatory Visit: Payer: No Typology Code available for payment source | Admitting: Internal Medicine

## 2015-05-10 IMAGING — CR DG ABDOMEN ACUTE W/ 1V CHEST
3 series · 3 of 3 positions shown · non-contrast
Comparison: 12/30/2013.

CLINICAL DATA: Abdominal pain.

EXAM:
ACUTE ABDOMEN SERIES (ABDOMEN 2 VIEW & CHEST 1 VIEW)

[w chest pa]
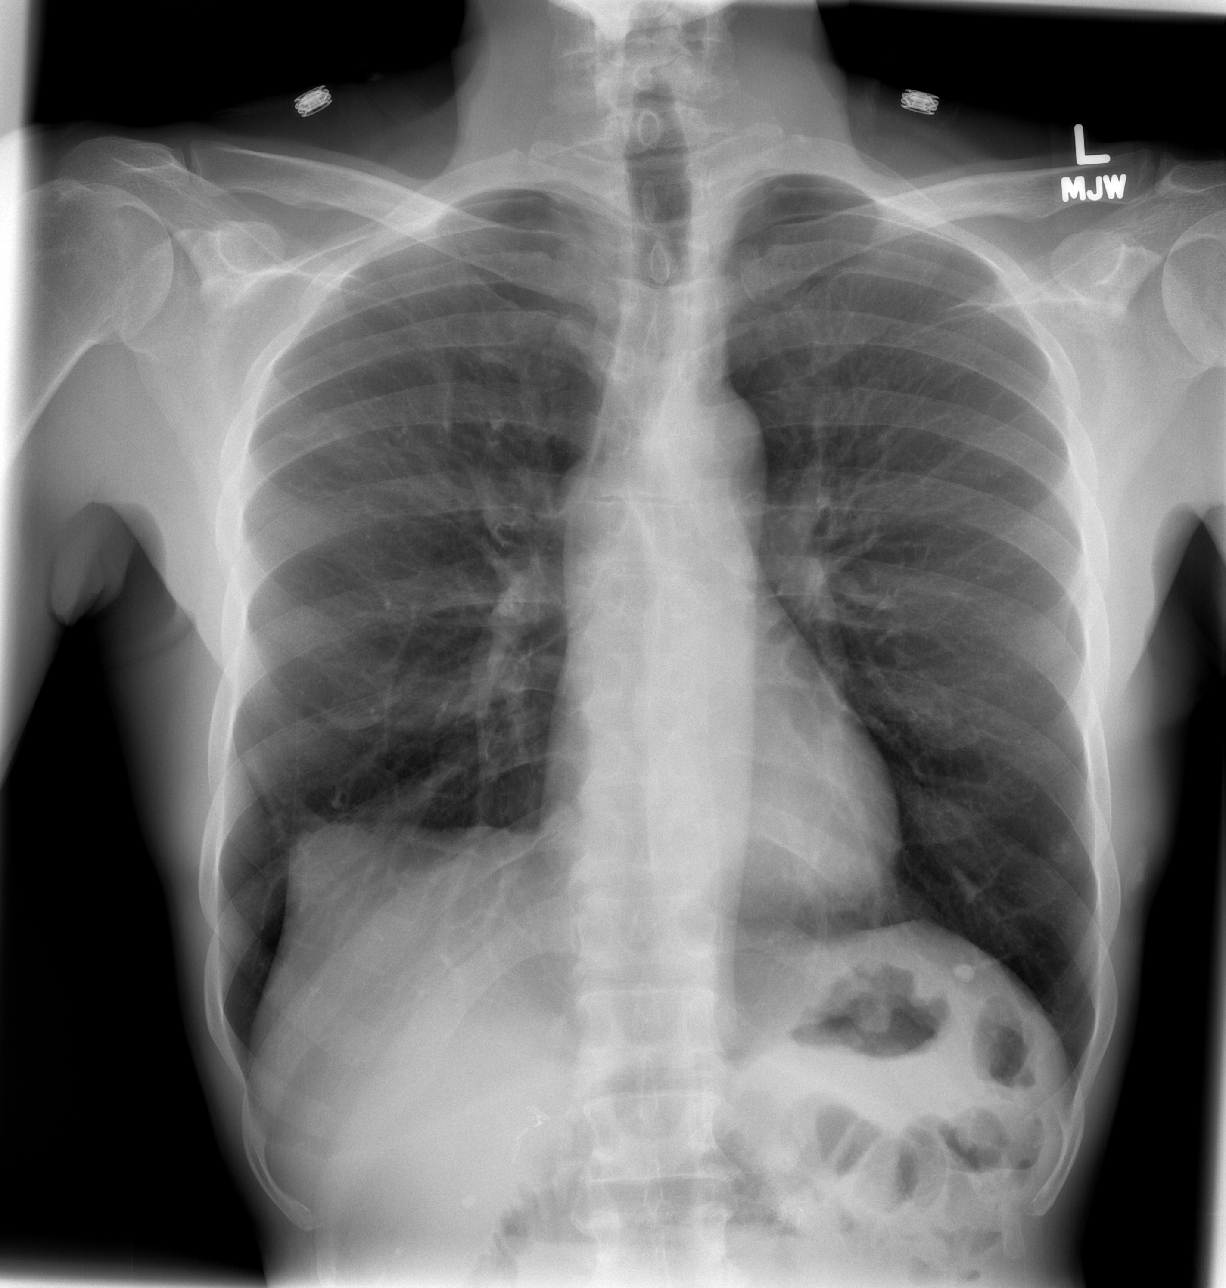

[w abdomen upright]
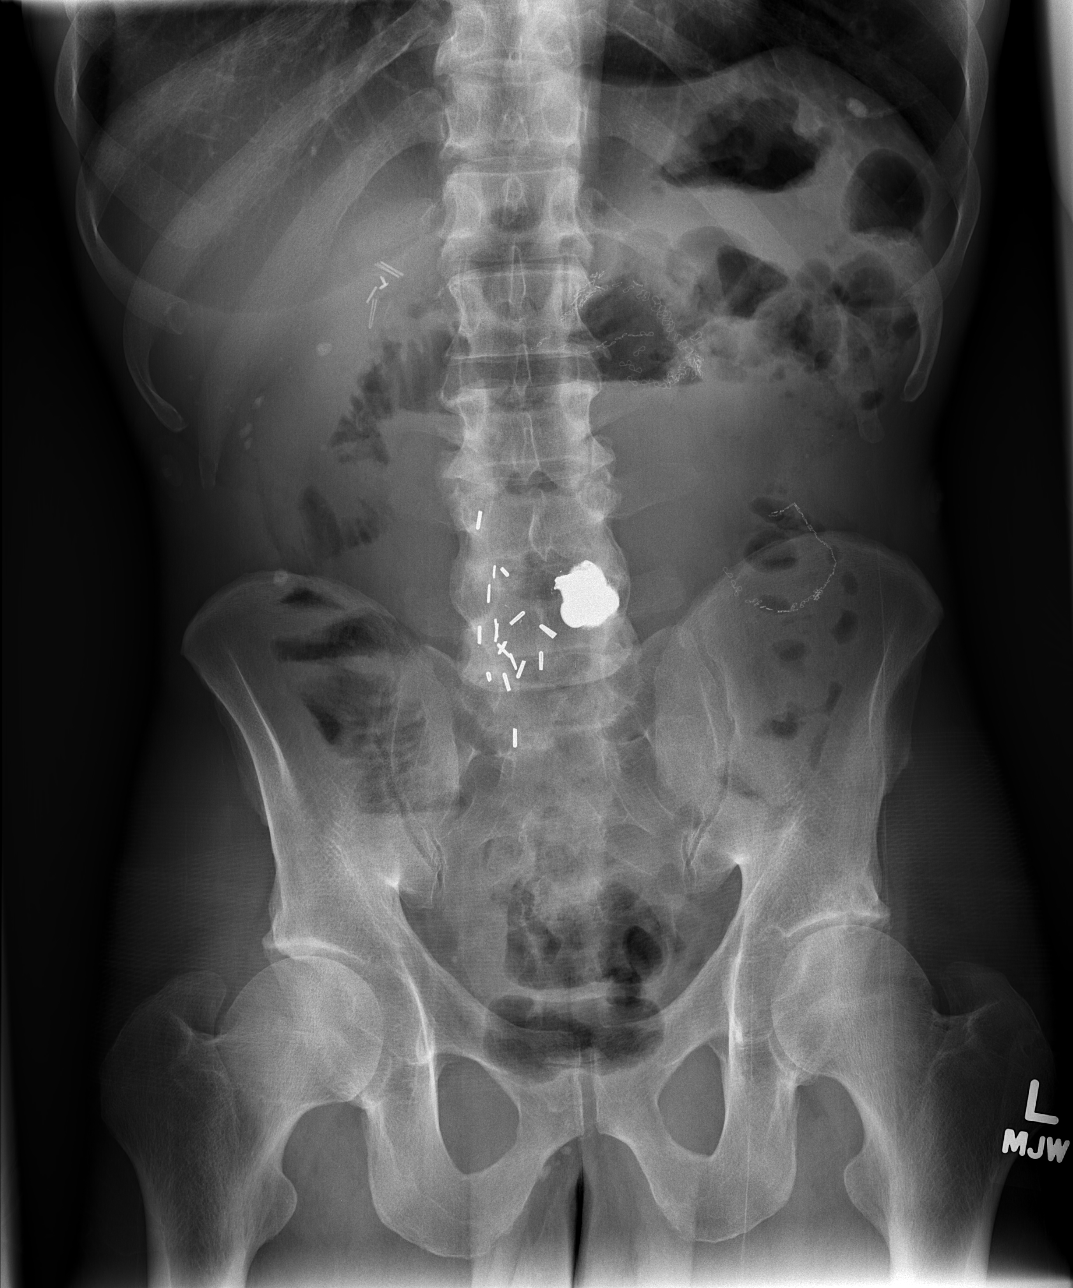

[t abdomen supine]
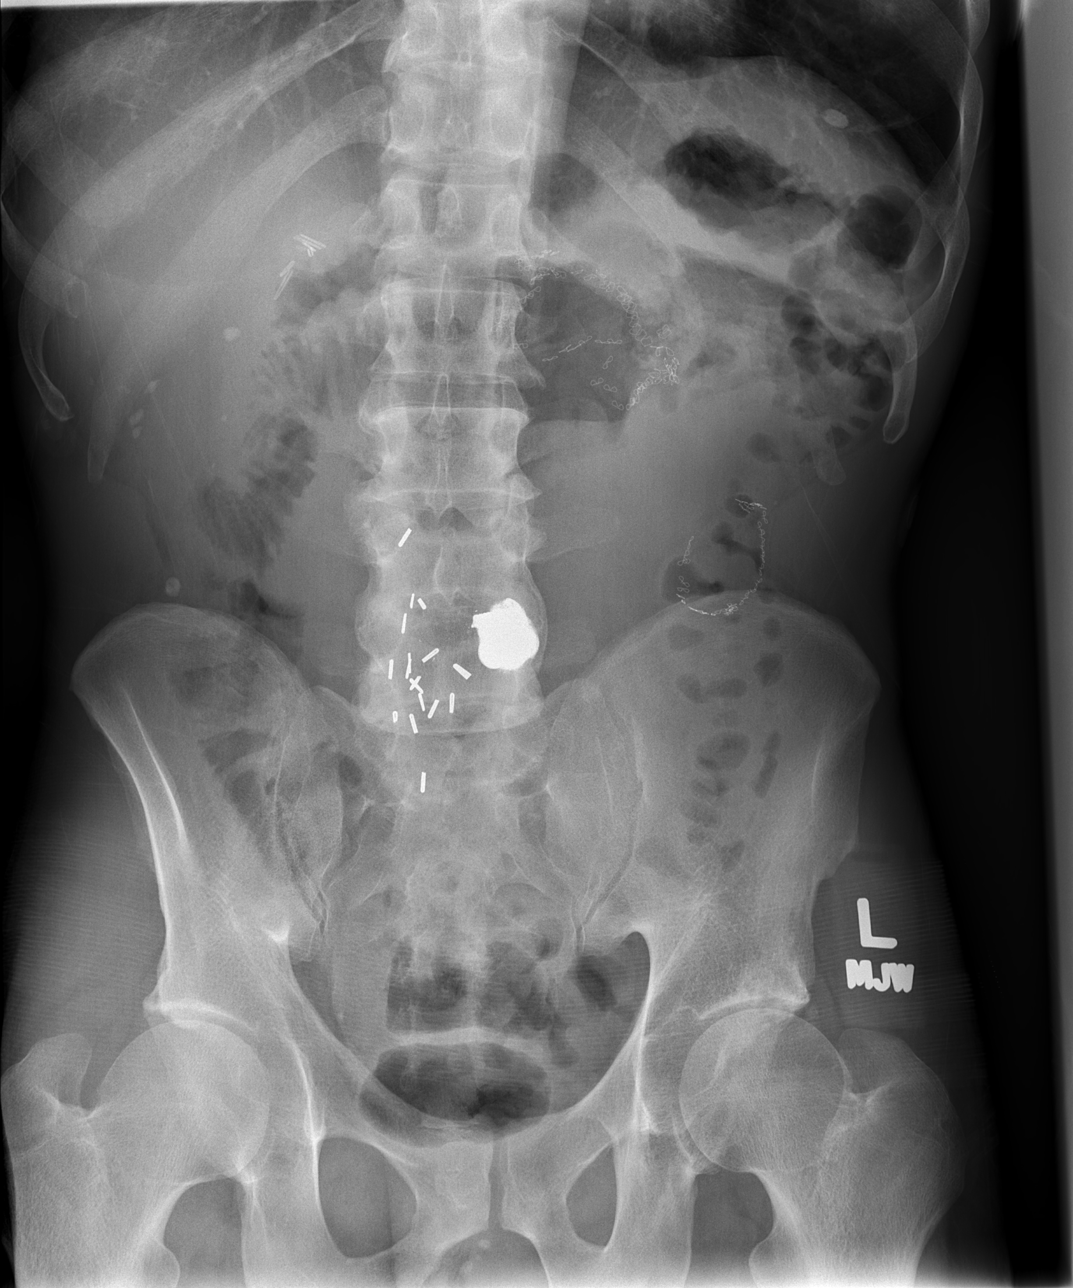

[3 of 3 positions shown; findings below may reference images not displayed]

FINDINGS: Lungs are hyperexpanded without edema or focal airspace
consolidation. Cardiac leads overlie the left lower lung.
Calcification over the dome of the left hemidiaphragm was seen to be
subdiaphragmatic on a previous CT scan. The cardiopericardial
silhouette is within normal limits for size.

Insert upper I 8 There is no evidence for gaseous bowel dilation to
suggest obstruction. Anastomotic suture lines are seen in the medial
left upper quadrant and mid left abdomen. Numerous surgical clips
overlie the midline mid abdomen with stable appearance of radiopaque
material overlying the L4 vertebral body.
IMPRESSION: No acute cardiopulmonary findings.

No evidence for bowel obstruction or perforation. Mild gaseous
distention of bowel in the right abdomen is nonspecific.
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
## 2015-05-24 ENCOUNTER — Telehealth: Payer: Self-pay | Admitting: Internal Medicine

## 2015-05-24 NOTE — Telephone Encounter (Signed)
Patient has called in today to ask if he needed to be seen to have his scripts filled; please f/u with patient about script pick-up process

## 2015-05-25 ENCOUNTER — Other Ambulatory Visit: Payer: Self-pay | Admitting: *Deleted

## 2015-05-25 MED ORDER — TRAMADOL HCL 50 MG PO TABS: 50.0000 mg | ORAL_TABLET | Freq: Two times a day (BID) | ORAL | Status: DC | PRN

## 2015-05-25 MED ORDER — DOCUSATE SODIUM 100 MG PO CAPS
100.0000 mg | ORAL_CAPSULE | Freq: Two times a day (BID) | ORAL | Status: DC
Start: 2015-05-25 — End: 2015-10-04

## 2015-05-25 MED ORDER — ONDANSETRON 4 MG PO TBDP: 4.0000 mg | ORAL_TABLET | Freq: Three times a day (TID) | ORAL | Status: DC | PRN

## 2015-05-25 MED ORDER — OMEPRAZOLE 20 MG PO CPDR: 20.0000 mg | DELAYED_RELEASE_CAPSULE | Freq: Every day | ORAL | Status: DC

## 2015-05-25 MED ORDER — CYCLOBENZAPRINE HCL 10 MG PO TABS: 10.0000 mg | ORAL_TABLET | Freq: Three times a day (TID) | ORAL | Status: DC | PRN

## 2015-05-25 NOTE — Telephone Encounter (Signed)
Patient called to check on the status of the med refill for Tramadol, Flexeril, Zofran, and stool softener. Patient also stated that the Prilosec is not helping his pain. Please f/u with pt.

## 2015-05-25 NOTE — Telephone Encounter (Signed)
Patient walked in to clinic requesting refills.  Patient's medication refilled per policy and Dr. Doreene Burke signed Rx for Tramadol.  Tramadol Rx handed to patient by this RN.

## 2015-05-26 ENCOUNTER — Encounter (HOSPITAL_COMMUNITY): Payer: Self-pay | Admitting: *Deleted

## 2015-05-26 ENCOUNTER — Emergency Department (HOSPITAL_COMMUNITY)
Admission: EM | Admit: 2015-05-26 | Discharge: 2015-05-26 | Disposition: A | Discharge status: Home / Self Care | Payer: No Typology Code available for payment source | Attending: Emergency Medicine | Admitting: Emergency Medicine

## 2015-05-26 DIAGNOSIS — R1084 Generalized abdominal pain: Secondary | ICD-10-CM | POA: Insufficient documentation

## 2015-05-26 DIAGNOSIS — Z87828 Personal history of other (healed) physical injury and trauma: Secondary | ICD-10-CM | POA: Insufficient documentation

## 2015-05-26 DIAGNOSIS — Z87442 Personal history of urinary calculi: Secondary | ICD-10-CM | POA: Insufficient documentation

## 2015-05-26 DIAGNOSIS — Z720 Tobacco use: Secondary | ICD-10-CM | POA: Insufficient documentation

## 2015-05-26 DIAGNOSIS — N189 Chronic kidney disease, unspecified: Secondary | ICD-10-CM | POA: Insufficient documentation

## 2015-05-26 DIAGNOSIS — G8929 Other chronic pain: Secondary | ICD-10-CM | POA: Insufficient documentation

## 2015-05-26 DIAGNOSIS — Z8719 Personal history of other diseases of the digestive system: Secondary | ICD-10-CM | POA: Insufficient documentation

## 2015-05-26 LAB — CBC WITH DIFFERENTIAL/PLATELET
Basophils Absolute: 0 10*3/uL (ref 0.0–0.1)
Basophils Relative: 0 % (ref 0–1)
Eosinophils Absolute: 0.2 10*3/uL (ref 0.0–0.7)
Eosinophils Relative: 2 % (ref 0–5)
HCT: 44.9 % (ref 39.0–52.0)
Hemoglobin: 15.7 g/dL (ref 13.0–17.0)
Lymphocytes Relative: 41 % (ref 12–46)
Lymphs Abs: 3.2 10*3/uL (ref 0.7–4.0)
MCH: 31 pg (ref 26.0–34.0)
MCHC: 35 g/dL (ref 30.0–36.0)
MCV: 88.7 fL (ref 78.0–100.0)
Monocytes Absolute: 0.6 10*3/uL (ref 0.1–1.0)
Monocytes Relative: 8 % (ref 3–12)
Neutro Abs: 3.8 10*3/uL (ref 1.7–7.7)
Neutrophils Relative %: 49 % (ref 43–77)
Platelets: 214 10*3/uL (ref 150–400)
RBC: 5.06 MIL/uL (ref 4.22–5.81)
RDW: 13.4 % (ref 11.5–15.5)
WBC: 7.8 10*3/uL (ref 4.0–10.5)

## 2015-05-26 LAB — COMPREHENSIVE METABOLIC PANEL
ALT: 15 U/L — ABNORMAL LOW (ref 17–63)
AST: 24 U/L (ref 15–41)
Albumin: 4.2 g/dL (ref 3.5–5.0)
Alkaline Phosphatase: 84 U/L (ref 38–126)
Anion gap: 11 (ref 5–15)
BUN: 8 mg/dL (ref 6–20)
CO2: 22 mmol/L (ref 22–32)
Calcium: 9.8 mg/dL (ref 8.9–10.3)
Chloride: 104 mmol/L (ref 101–111)
Creatinine, Ser: 0.94 mg/dL (ref 0.61–1.24)
GFR calc Af Amer: >60 mL/min (ref >60)
GFR calc non Af Amer: >60 mL/min (ref >60)
Glucose, Bld: 89 mg/dL (ref 65–99)
Potassium: 3.8 mmol/L (ref 3.5–5.1)
Sodium: 137 mmol/L (ref 135–145)
Total Bilirubin: 0.7 mg/dL (ref 0.3–1.2)
Total Protein: 7.9 g/dL (ref 6.5–8.1)

## 2015-05-26 LAB — LIPASE, BLOOD: Lipase: 21 U/L — ABNORMAL LOW (ref 22–51)

## 2015-05-26 MED ORDER — KETOROLAC TROMETHAMINE 30 MG/ML IJ SOLN
30.0000 mg | Freq: Once | INTRAMUSCULAR | Status: AC
  Administered 2015-05-26: 30 mg via INTRAVENOUS
  Filled 2015-05-26: qty 1

## 2015-05-26 MED ORDER — DICYCLOMINE HCL 20 MG PO TABS: 20.0000 mg | ORAL_TABLET | Freq: Two times a day (BID) | ORAL | Status: DC

## 2015-05-26 MED ORDER — SODIUM CHLORIDE 0.9 % IV BOLUS (SEPSIS)
1000.0000 mL | Freq: Once | INTRAVENOUS | Status: AC
  Administered 2015-05-26: 1000 mL via INTRAVENOUS

## 2015-05-26 MED ORDER — ONDANSETRON HCL 4 MG/2ML IJ SOLN
4.0000 mg | Freq: Once | INTRAMUSCULAR | Status: AC
  Administered 2015-05-26: 4 mg via INTRAVENOUS
  Filled 2015-05-26: qty 2

## 2015-05-26 NOTE — ED Notes (Signed)
Sabra Heck, MD at bedside speaking with the pt re: his request for pain medication

## 2015-05-26 NOTE — ED Notes (Signed)
Pt in from home via [facility], pt c/o chronic abd pain & diarrhea, pt A&O x4, follows commands, speaks in complete sentences, 10/10 pain

## 2015-05-26 NOTE — Discharge Instructions (Signed)
Please call your doctor for a followup appointment within 24-48 hours. When you talk to your doctor please let them know that you were seen in the emergency department and have them acquire all of your records so that they can discuss the findings with you and formulate a treatment plan to fully care for your new and ongoing problems. ° °Abdominal Pain °Many things can cause abdominal pain. Usually, abdominal pain is not caused by a disease and will improve without treatment. It can often be observed and treated at home. Your health care provider will do a physical exam and possibly order blood tests and X-rays to help determine the seriousness of your pain. However, in many cases, more time must pass before a clear cause of the pain can be found. Before that point, your health care provider may not know if you need more testing or further treatment. °HOME CARE INSTRUCTIONS  °Monitor your abdominal pain for any changes. The following actions may help to alleviate any discomfort you are experiencing: °· Only take over-the-counter or prescription medicines as directed by your health care provider. °· Do not take laxatives unless directed to do so by your health care provider. °· Try a clear liquid diet (broth, tea, or water) as directed by your health care provider. Slowly move to a bland diet as tolerated. °SEEK MEDICAL CARE IF: °· You have unexplained abdominal pain. °· You have abdominal pain associated with nausea or diarrhea. °· You have pain when you urinate or have a bowel movement. °· You experience abdominal pain that wakes you in the night. °· You have abdominal pain that is worsened or improved by eating food. °· You have abdominal pain that is worsened with eating fatty foods. °· You have a fever. °SEEK IMMEDIATE MEDICAL CARE IF:  °· Your pain does not go away within 2 hours. °· You keep throwing up (vomiting). °· Your pain is felt only in portions of the abdomen, such as the right side or the left lower  portion of the abdomen. °· You pass bloody or black tarry stools. °MAKE SURE YOU: °· Understand these instructions.   °· Will watch your condition.   °· Will get help right away if you are not doing well or get worse.   °Document Released: 08/30/2005 Document Revised: 11/25/2013 Document Reviewed: 07/30/2013 °[employer]® Patient Information ©2015 [employer]. This information is not intended to replace advice given to you by your health care provider. Make sure you discuss any questions you have with your health care provider. ° °

## 2015-05-26 NOTE — ED Notes (Signed)
Pt states, "I need some pain medicine." pt thrashing around in the bed, pt yelling out
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
## 2015-05-26 NOTE — ED Notes (Signed)
Sabra Heck, MD aware of pts request for pain medication, Miller MD to speak with the pt

## 2015-05-28 ENCOUNTER — Emergency Department (HOSPITAL_COMMUNITY)
Admission: EM | Admit: 2015-05-28 | Discharge: 2015-05-28 | Disposition: A | Discharge status: Home / Self Care | Payer: No Typology Code available for payment source | Attending: Emergency Medicine | Admitting: Emergency Medicine

## 2015-05-28 ENCOUNTER — Encounter (HOSPITAL_COMMUNITY): Payer: Self-pay | Admitting: *Deleted

## 2015-05-28 DIAGNOSIS — Z8739 Personal history of other diseases of the musculoskeletal system and connective tissue: Secondary | ICD-10-CM | POA: Insufficient documentation

## 2015-05-28 DIAGNOSIS — Z8719 Personal history of other diseases of the digestive system: Secondary | ICD-10-CM | POA: Insufficient documentation

## 2015-05-28 DIAGNOSIS — Z720 Tobacco use: Secondary | ICD-10-CM | POA: Insufficient documentation

## 2015-05-28 DIAGNOSIS — Z87828 Personal history of other (healed) physical injury and trauma: Secondary | ICD-10-CM | POA: Insufficient documentation

## 2015-05-28 DIAGNOSIS — N189 Chronic kidney disease, unspecified: Secondary | ICD-10-CM | POA: Insufficient documentation

## 2015-05-28 DIAGNOSIS — R1084 Generalized abdominal pain: Secondary | ICD-10-CM

## 2015-05-28 DIAGNOSIS — Z87442 Personal history of urinary calculi: Secondary | ICD-10-CM | POA: Insufficient documentation

## 2015-05-28 DIAGNOSIS — R109 Unspecified abdominal pain: Secondary | ICD-10-CM

## 2015-05-28 DIAGNOSIS — Z9049 Acquired absence of other specified parts of digestive tract: Secondary | ICD-10-CM | POA: Insufficient documentation

## 2015-05-28 DIAGNOSIS — Z872 Personal history of diseases of the skin and subcutaneous tissue: Secondary | ICD-10-CM | POA: Insufficient documentation

## 2015-05-28 DIAGNOSIS — G8929 Other chronic pain: Secondary | ICD-10-CM

## 2015-05-28 MED ORDER — SODIUM CHLORIDE 0.9 % IV BOLUS (SEPSIS)
1000.0000 mL | Freq: Once | INTRAVENOUS | Status: AC
  Administered 2015-05-28: 1000 mL via INTRAVENOUS
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
## 2015-05-28 NOTE — ED Notes (Signed)
Pt given turkey sandwich and Sprite 
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
## 2015-05-28 NOTE — ED Notes (Signed)
Pt called out stating he is now ready for discharge

## 2015-05-28 NOTE — Discharge Instructions (Signed)

## 2015-05-28 NOTE — ED Notes (Addendum)
Per EMS pt from home with c/o chronic abdominal pain, ongoing for over a week, was seen at [facility] ED 2 days ago, sts was not able to get his pain medication prescription filled. Pt was given 150 mcg fentanyl en route

## 2015-05-31 ENCOUNTER — Telehealth: Payer: Self-pay | Admitting: Clinical
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
## 2015-06-17 ENCOUNTER — Encounter (HOSPITAL_COMMUNITY): Payer: Self-pay | Admitting: *Deleted

## 2015-06-17 ENCOUNTER — Emergency Department (HOSPITAL_COMMUNITY)
Admission: EM | Admit: 2015-06-17 | Discharge: 2015-06-17 | Discharge status: Against Medical Advice | Payer: No Typology Code available for payment source | Attending: Emergency Medicine | Admitting: Emergency Medicine

## 2015-06-17 DIAGNOSIS — N189 Chronic kidney disease, unspecified: Secondary | ICD-10-CM | POA: Insufficient documentation

## 2015-06-17 DIAGNOSIS — R1032 Left lower quadrant pain: Secondary | ICD-10-CM | POA: Insufficient documentation

## 2015-06-17 DIAGNOSIS — G8929 Other chronic pain: Secondary | ICD-10-CM | POA: Insufficient documentation

## 2015-06-17 DIAGNOSIS — Z5321 Procedure and treatment not carried out due to patient leaving prior to being seen by health care provider: Secondary | ICD-10-CM

## 2015-06-17 DIAGNOSIS — R1110 Vomiting, unspecified: Secondary | ICD-10-CM | POA: Insufficient documentation

## 2015-06-17 DIAGNOSIS — R197 Diarrhea, unspecified: Secondary | ICD-10-CM | POA: Insufficient documentation

## 2015-06-17 DIAGNOSIS — R1031 Right lower quadrant pain: Secondary | ICD-10-CM | POA: Insufficient documentation

## 2015-06-17 DIAGNOSIS — Z720 Tobacco use: Secondary | ICD-10-CM | POA: Insufficient documentation

## 2015-06-17 LAB — COMPREHENSIVE METABOLIC PANEL
ALT: 17 U/L (ref 17–63)
AST: 24 U/L (ref 15–41)
Albumin: 4.1 g/dL (ref 3.5–5.0)
Alkaline Phosphatase: 84 U/L (ref 38–126)
Anion gap: 7 (ref 5–15)
BUN: 10 mg/dL (ref 6–20)
CO2: 25 mmol/L (ref 22–32)
Calcium: 9.5 mg/dL (ref 8.9–10.3)
Chloride: 106 mmol/L (ref 101–111)
Creatinine, Ser: 1.08 mg/dL (ref 0.61–1.24)
GFR calc Af Amer: >60 mL/min (ref >60)
GFR calc non Af Amer: >60 mL/min (ref >60)
Glucose, Bld: 111 mg/dL — ABNORMAL HIGH (ref 65–99)
Potassium: 4.2 mmol/L (ref 3.5–5.1)
Sodium: 138 mmol/L (ref 135–145)
Total Bilirubin: 0.6 mg/dL (ref 0.3–1.2)
Total Protein: 8 g/dL (ref 6.5–8.1)

## 2015-06-17 LAB — URINALYSIS, ROUTINE W REFLEX MICROSCOPIC
Glucose, UA: NEGATIVE mg/dL
Hgb urine dipstick: NEGATIVE
Ketones, ur: NEGATIVE mg/dL
Leukocytes, UA: NEGATIVE
Nitrite: NEGATIVE
Protein, ur: NEGATIVE mg/dL
Specific Gravity, Urine: 1.041 — ABNORMAL HIGH (ref 1.005–1.030)
Urobilinogen, UA: 1 mg/dL (ref 0.0–1.0)
pH: 5 (ref 5.0–8.0)

## 2015-06-17 LAB — CBC
HCT: 40.6 % (ref 39.0–52.0)
Hemoglobin: 14.1 g/dL (ref 13.0–17.0)
MCH: 31.1 pg (ref 26.0–34.0)
MCHC: 34.7 g/dL (ref 30.0–36.0)
MCV: 89.6 fL (ref 78.0–100.0)
Platelets: 250 10*3/uL (ref 150–400)
RBC: 4.53 MIL/uL (ref 4.22–5.81)
RDW: 13.3 % (ref 11.5–15.5)
WBC: 7.2 10*3/uL (ref 4.0–10.5)

## 2015-06-17 LAB — LIPASE, BLOOD: Lipase: 25 U/L (ref 22–51)

## 2015-06-17 MED ORDER — ONDANSETRON 4 MG PO TBDP
4.0000 mg | ORAL_TABLET | Freq: Once | ORAL | Status: AC | PRN
  Administered 2015-06-17: 4 mg via ORAL

## 2015-06-17 MED ORDER — ONDANSETRON 4 MG PO TBDP
ORAL_TABLET | ORAL | Status: AC
  Filled 2015-06-17: qty 2

## 2015-06-17 MED ORDER — OXYCODONE-ACETAMINOPHEN 5-325 MG PO TABS
ORAL_TABLET | ORAL | Status: DC
Start: 2015-06-17 — End: 2015-06-17
  Filled 2015-06-17: qty 1

## 2015-06-17 MED ORDER — OXYCODONE-ACETAMINOPHEN 5-325 MG PO TABS
1.0000 | ORAL_TABLET | Freq: Once | ORAL | Status: AC
  Administered 2015-06-17: 1 via ORAL

## 2015-06-17 NOTE — ED Notes (Signed)
EMT and this nurse attempted to locate patient x2.

## 2015-06-17 NOTE — ED Notes (Signed)
Pt called multiple times no answer 

## 2015-06-17 NOTE — ED Notes (Signed)
Called pt multiple times to go back to room with no answer.

## 2015-06-17 NOTE — ED Notes (Signed)
Pt here for abdominal pain, in both lower quadrants.  Hx of chronic abdominal pain.  Also c/o diarrhea and vomiting since this am.  Took toradol and flexeril with some relief.

## 2015-06-22 ENCOUNTER — Emergency Department (HOSPITAL_COMMUNITY)
Admission: EM | Admit: 2015-06-22 | Discharge: 2015-06-22 | Disposition: A | Discharge status: Home / Self Care | Payer: No Typology Code available for payment source | Attending: Emergency Medicine | Admitting: Emergency Medicine

## 2015-06-22 ENCOUNTER — Encounter (HOSPITAL_COMMUNITY): Payer: Self-pay | Admitting: Neurology

## 2015-06-22 DIAGNOSIS — Z9889 Other specified postprocedural states: Secondary | ICD-10-CM | POA: Insufficient documentation

## 2015-06-22 DIAGNOSIS — Z9089 Acquired absence of other organs: Secondary | ICD-10-CM | POA: Insufficient documentation

## 2015-06-22 DIAGNOSIS — R1033 Periumbilical pain: Secondary | ICD-10-CM | POA: Insufficient documentation

## 2015-06-22 DIAGNOSIS — Z87828 Personal history of other (healed) physical injury and trauma: Secondary | ICD-10-CM | POA: Insufficient documentation

## 2015-06-22 DIAGNOSIS — Z8739 Personal history of other diseases of the musculoskeletal system and connective tissue: Secondary | ICD-10-CM | POA: Insufficient documentation

## 2015-06-22 DIAGNOSIS — Z87442 Personal history of urinary calculi: Secondary | ICD-10-CM | POA: Insufficient documentation

## 2015-06-22 DIAGNOSIS — Z872 Personal history of diseases of the skin and subcutaneous tissue: Secondary | ICD-10-CM | POA: Insufficient documentation

## 2015-06-22 DIAGNOSIS — N189 Chronic kidney disease, unspecified: Secondary | ICD-10-CM | POA: Insufficient documentation

## 2015-06-22 DIAGNOSIS — Z79899 Other long term (current) drug therapy: Secondary | ICD-10-CM | POA: Insufficient documentation

## 2015-06-22 DIAGNOSIS — R109 Unspecified abdominal pain: Secondary | ICD-10-CM

## 2015-06-22 DIAGNOSIS — Z720 Tobacco use: Secondary | ICD-10-CM | POA: Insufficient documentation

## 2015-06-22 DIAGNOSIS — G8929 Other chronic pain: Secondary | ICD-10-CM | POA: Insufficient documentation

## 2015-06-22 LAB — COMPREHENSIVE METABOLIC PANEL
ALT: 22 U/L (ref 17–63)
AST: 32 U/L (ref 15–41)
Albumin: 4.2 g/dL (ref 3.5–5.0)
Alkaline Phosphatase: 87 U/L (ref 38–126)
Anion gap: 10 (ref 5–15)
BUN: 8 mg/dL (ref 6–20)
CO2: 23 mmol/L (ref 22–32)
Calcium: 9.1 mg/dL (ref 8.9–10.3)
Chloride: 103 mmol/L (ref 101–111)
Creatinine, Ser: 0.93 mg/dL (ref 0.61–1.24)
GFR calc Af Amer: >60 mL/min (ref >60)
GFR calc non Af Amer: >60 mL/min (ref >60)
Glucose, Bld: 87 mg/dL (ref 65–99)
Potassium: 4 mmol/L (ref 3.5–5.1)
Sodium: 136 mmol/L (ref 135–145)
Total Bilirubin: 0.9 mg/dL (ref 0.3–1.2)
Total Protein: 7.8 g/dL (ref 6.5–8.1)

## 2015-06-22 LAB — CBC WITH DIFFERENTIAL/PLATELET
Basophils Absolute: 0 10*3/uL (ref 0.0–0.1)
Basophils Relative: 1 % (ref 0–1)
Eosinophils Absolute: 0.2 10*3/uL (ref 0.0–0.7)
Eosinophils Relative: 2 % (ref 0–5)
HCT: 43.2 % (ref 39.0–52.0)
Hemoglobin: 14.9 g/dL (ref 13.0–17.0)
Lymphocytes Relative: 31 % (ref 12–46)
Lymphs Abs: 2.4 10*3/uL (ref 0.7–4.0)
MCH: 31.3 pg (ref 26.0–34.0)
MCHC: 34.5 g/dL (ref 30.0–36.0)
MCV: 90.8 fL (ref 78.0–100.0)
Monocytes Absolute: 0.5 10*3/uL (ref 0.1–1.0)
Monocytes Relative: 6 % (ref 3–12)
Neutro Abs: 4.5 10*3/uL (ref 1.7–7.7)
Neutrophils Relative %: 60 % (ref 43–77)
Platelets: 174 10*3/uL (ref 150–400)
RBC: 4.76 MIL/uL (ref 4.22–5.81)
RDW: 13.2 % (ref 11.5–15.5)
WBC: 7.5 10*3/uL (ref 4.0–10.5)

## 2015-06-22 LAB — LIPASE, BLOOD: Lipase: 22 U/L (ref 22–51)

## 2015-06-22 MED ORDER — KETOROLAC TROMETHAMINE 30 MG/ML IJ SOLN
30.0000 mg | Freq: Once | INTRAMUSCULAR | Status: AC
  Administered 2015-06-22: 30 mg via INTRAVENOUS
  Filled 2015-06-22: qty 1

## 2015-06-22 MED ORDER — SODIUM CHLORIDE 0.9 % IV BOLUS (SEPSIS)
1000.0000 mL | INTRAVENOUS | Status: AC
  Administered 2015-06-22: 1000 mL via INTRAVENOUS
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
## 2015-06-22 NOTE — Progress Notes (Signed)
[facility] [phone]  Spoke with patient regarding primary care resources and the [facility] orange card. Patient has been given the orange card application and instructions several times during prior ED visits. Patient has not been complaint with follow up care options. Patient in past visits has been linked with various community resources. Pt states he has the orange card application and will bring me the completed documents to get re-established with the orange card and his pcp. I informed patient of required documents. Pt educated on the importance of following up with his pcp instead of the ED, pt verbalized understanding. My contact information provided for any future questions or concerns. No other [address] Specialist needs identified at this time.
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
## 2015-07-05 ENCOUNTER — Other Ambulatory Visit: Payer: Self-pay | Admitting: *Deleted

## 2015-07-05 MED ORDER — TRAMADOL HCL 50 MG PO TABS: 50.0000 mg | ORAL_TABLET | Freq: Two times a day (BID) | ORAL | Status: DC | PRN

## 2015-07-05 NOTE — Telephone Encounter (Signed)
Spoke to patient and verified name and date of birth.  Patient was told his Tramadol Rx was ready to be picked up.

## 2015-07-14 ENCOUNTER — Emergency Department (HOSPITAL_COMMUNITY)
Admission: EM | Admit: 2015-07-14 | Discharge: 2015-07-14 | Disposition: A | Discharge status: Home / Self Care | Payer: No Typology Code available for payment source | Attending: Emergency Medicine | Admitting: Emergency Medicine

## 2015-07-14 ENCOUNTER — Encounter (HOSPITAL_COMMUNITY): Payer: Self-pay | Admitting: Emergency Medicine

## 2015-07-14 DIAGNOSIS — Z720 Tobacco use: Secondary | ICD-10-CM | POA: Insufficient documentation

## 2015-07-14 DIAGNOSIS — Z87442 Personal history of urinary calculi: Secondary | ICD-10-CM | POA: Insufficient documentation

## 2015-07-14 DIAGNOSIS — R1084 Generalized abdominal pain: Secondary | ICD-10-CM | POA: Insufficient documentation

## 2015-07-14 DIAGNOSIS — Z87828 Personal history of other (healed) physical injury and trauma: Secondary | ICD-10-CM | POA: Insufficient documentation

## 2015-07-14 DIAGNOSIS — Z9049 Acquired absence of other specified parts of digestive tract: Secondary | ICD-10-CM | POA: Insufficient documentation

## 2015-07-14 DIAGNOSIS — Z8739 Personal history of other diseases of the musculoskeletal system and connective tissue: Secondary | ICD-10-CM | POA: Insufficient documentation

## 2015-07-14 DIAGNOSIS — N189 Chronic kidney disease, unspecified: Secondary | ICD-10-CM | POA: Insufficient documentation

## 2015-07-14 DIAGNOSIS — Z79899 Other long term (current) drug therapy: Secondary | ICD-10-CM | POA: Insufficient documentation

## 2015-07-14 DIAGNOSIS — Z8719 Personal history of other diseases of the digestive system: Secondary | ICD-10-CM | POA: Insufficient documentation

## 2015-07-14 DIAGNOSIS — Z872 Personal history of diseases of the skin and subcutaneous tissue: Secondary | ICD-10-CM | POA: Insufficient documentation

## 2015-07-14 DIAGNOSIS — G8929 Other chronic pain: Secondary | ICD-10-CM | POA: Insufficient documentation

## 2015-07-14 LAB — COMPREHENSIVE METABOLIC PANEL
ALT: 13 U/L — ABNORMAL LOW (ref 17–63)
AST: 19 U/L (ref 15–41)
Albumin: 3.8 g/dL (ref 3.5–5.0)
Alkaline Phosphatase: 72 U/L (ref 38–126)
Anion gap: 7 (ref 5–15)
BUN: 10 mg/dL (ref 6–20)
CO2: 26 mmol/L (ref 22–32)
Calcium: 9.6 mg/dL (ref 8.9–10.3)
Chloride: 105 mmol/L (ref 101–111)
Creatinine, Ser: 1.04 mg/dL (ref 0.61–1.24)
GFR calc Af Amer: >60 mL/min (ref >60)
GFR calc non Af Amer: >60 mL/min (ref >60)
Glucose, Bld: 97 mg/dL (ref 65–99)
Potassium: 4.2 mmol/L (ref 3.5–5.1)
Sodium: 138 mmol/L (ref 135–145)
Total Bilirubin: 0.8 mg/dL (ref 0.3–1.2)
Total Protein: 6.9 g/dL (ref 6.5–8.1)

## 2015-07-14 LAB — URINE MICROSCOPIC-ADD ON

## 2015-07-14 LAB — URINALYSIS, ROUTINE W REFLEX MICROSCOPIC
Bilirubin Urine: NEGATIVE
Glucose, UA: NEGATIVE mg/dL
Ketones, ur: NEGATIVE mg/dL
Leukocytes, UA: NEGATIVE
Nitrite: NEGATIVE
Protein, ur: NEGATIVE mg/dL
Specific Gravity, Urine: 1.026 (ref 1.005–1.030)
Urobilinogen, UA: 0.2 mg/dL (ref 0.0–1.0)
pH: 5.5 (ref 5.0–8.0)

## 2015-07-14 LAB — CBC
HCT: 40.5 % (ref 39.0–52.0)
Hemoglobin: 13.7 g/dL (ref 13.0–17.0)
MCH: 31.1 pg (ref 26.0–34.0)
MCHC: 33.8 g/dL (ref 30.0–36.0)
MCV: 92 fL (ref 78.0–100.0)
Platelets: 225 10*3/uL (ref 150–400)
RBC: 4.4 MIL/uL (ref 4.22–5.81)
RDW: 14.4 % (ref 11.5–15.5)
WBC: 8.2 10*3/uL (ref 4.0–10.5)

## 2015-07-14 LAB — LIPASE, BLOOD: Lipase: 24 U/L (ref 22–51)

## 2015-07-14 MED ORDER — KETOROLAC TROMETHAMINE 30 MG/ML IJ SOLN
30.0000 mg | Freq: Once | INTRAMUSCULAR | Status: AC
  Administered 2015-07-14: 30 mg via INTRAVENOUS
  Filled 2015-07-14: qty 1

## 2015-07-14 MED ORDER — SODIUM CHLORIDE 0.9 % IV BOLUS (SEPSIS)
1000.0000 mL | Freq: Once | INTRAVENOUS | Status: AC
  Administered 2015-07-14: 1000 mL via INTRAVENOUS

## 2015-07-14 MED ORDER — ONDANSETRON HCL 4 MG/2ML IJ SOLN
4.0000 mg | Freq: Once | INTRAMUSCULAR | Status: AC
  Administered 2015-07-14: 4 mg via INTRAVENOUS
  Filled 2015-07-14: qty 2
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
## 2015-07-14 NOTE — Discharge Instructions (Signed)

## 2015-07-14 NOTE — ED Notes (Signed)
Patient still moaning and yelling in the room.  MD aware.

## 2015-07-22 ENCOUNTER — Encounter: Payer: Self-pay | Admitting: Internal Medicine

## 2015-07-22 ENCOUNTER — Other Ambulatory Visit: Payer: No Typology Code available for payment source

## 2015-07-22 ENCOUNTER — Ambulatory Visit: Payer: No Typology Code available for payment source | Attending: Internal Medicine | Admitting: Internal Medicine

## 2015-07-22 VITALS — BP 137/87 | HR 75 | Temp 97.8°F | Resp 18 | Ht 70.5 in | Wt 143.8 lb

## 2015-07-22 DIAGNOSIS — K2100 Gastro-esophageal reflux disease with esophagitis, without bleeding: Secondary | ICD-10-CM

## 2015-07-22 DIAGNOSIS — R1031 Right lower quadrant pain: Secondary | ICD-10-CM | POA: Insufficient documentation

## 2015-07-22 DIAGNOSIS — M62838 Other muscle spasm: Secondary | ICD-10-CM

## 2015-07-22 DIAGNOSIS — N189 Chronic kidney disease, unspecified: Secondary | ICD-10-CM | POA: Insufficient documentation

## 2015-07-22 DIAGNOSIS — Z79899 Other long term (current) drug therapy: Secondary | ICD-10-CM | POA: Insufficient documentation

## 2015-07-22 DIAGNOSIS — G8929 Other chronic pain: Secondary | ICD-10-CM

## 2015-07-22 MED ORDER — TRAMADOL HCL 50 MG PO TABS: 50.0000 mg | ORAL_TABLET | Freq: Two times a day (BID) | ORAL | Status: DC | PRN

## 2015-07-22 MED ORDER — CYCLOBENZAPRINE HCL 10 MG PO TABS: 10.0000 mg | ORAL_TABLET | Freq: Three times a day (TID) | ORAL | Status: DC | PRN

## 2015-07-22 MED ORDER — OMEPRAZOLE 20 MG PO CPDR: 20.0000 mg | DELAYED_RELEASE_CAPSULE | Freq: Every day | ORAL | Status: DC

## 2015-07-22 NOTE — Progress Notes (Signed)
Pt here for follow up for abdominal pain. Pt denies pain at this moment. Patient reports abdominal  pain located on mid right side when he wakes in the morning. Pt reports it feels like he is  bloated or his ribs are pushing up against it.   Pt would like to discuss bentyl and it's use. Pt has not taken bentyl at all because he doesn't know what it is for.   Pt needs refills on all medications (flexeril, colace, tramadol, omeprazole, zofran). Pt has taken his medications today.

## 2015-07-22 NOTE — Patient Instructions (Signed)
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
## 2015-07-23 ENCOUNTER — Other Ambulatory Visit: Payer: No Typology Code available for payment source

## 2015-07-26 ENCOUNTER — Emergency Department (HOSPITAL_COMMUNITY)
Admission: EM | Admit: 2015-07-26 | Discharge: 2015-07-27 | Disposition: A | Discharge status: Home / Self Care | Payer: No Typology Code available for payment source | Attending: Emergency Medicine | Admitting: Emergency Medicine

## 2015-07-26 ENCOUNTER — Encounter (HOSPITAL_COMMUNITY): Payer: Self-pay

## 2015-07-26 ENCOUNTER — Emergency Department (HOSPITAL_COMMUNITY): Payer: No Typology Code available for payment source

## 2015-07-26 DIAGNOSIS — N189 Chronic kidney disease, unspecified: Secondary | ICD-10-CM | POA: Insufficient documentation

## 2015-07-26 DIAGNOSIS — Z720 Tobacco use: Secondary | ICD-10-CM | POA: Insufficient documentation

## 2015-07-26 DIAGNOSIS — G8929 Other chronic pain: Secondary | ICD-10-CM | POA: Insufficient documentation

## 2015-07-26 DIAGNOSIS — Z87828 Personal history of other (healed) physical injury and trauma: Secondary | ICD-10-CM | POA: Insufficient documentation

## 2015-07-26 DIAGNOSIS — Z9049 Acquired absence of other specified parts of digestive tract: Secondary | ICD-10-CM | POA: Insufficient documentation

## 2015-07-26 DIAGNOSIS — Z872 Personal history of diseases of the skin and subcutaneous tissue: Secondary | ICD-10-CM | POA: Insufficient documentation

## 2015-07-26 DIAGNOSIS — Z79899 Other long term (current) drug therapy: Secondary | ICD-10-CM | POA: Insufficient documentation

## 2015-07-26 DIAGNOSIS — R109 Unspecified abdominal pain: Secondary | ICD-10-CM

## 2015-07-26 DIAGNOSIS — R1032 Left lower quadrant pain: Secondary | ICD-10-CM | POA: Insufficient documentation

## 2015-07-26 DIAGNOSIS — Z87442 Personal history of urinary calculi: Secondary | ICD-10-CM | POA: Insufficient documentation

## 2015-07-26 DIAGNOSIS — R1031 Right lower quadrant pain: Secondary | ICD-10-CM | POA: Insufficient documentation

## 2015-07-26 DIAGNOSIS — R112 Nausea with vomiting, unspecified: Secondary | ICD-10-CM | POA: Insufficient documentation

## 2015-07-26 LAB — COMPREHENSIVE METABOLIC PANEL
ALT: 16 U/L — ABNORMAL LOW (ref 17–63)
AST: 24 U/L (ref 15–41)
Albumin: 4.4 g/dL (ref 3.5–5.0)
Alkaline Phosphatase: 83 U/L (ref 38–126)
Anion gap: 7 (ref 5–15)
BUN: 10 mg/dL (ref 6–20)
CO2: 25 mmol/L (ref 22–32)
Calcium: 9.7 mg/dL (ref 8.9–10.3)
Chloride: 105 mmol/L (ref 101–111)
Creatinine, Ser: 1.01 mg/dL (ref 0.61–1.24)
GFR calc Af Amer: >60 mL/min (ref >60)
GFR calc non Af Amer: >60 mL/min (ref >60)
Glucose, Bld: 104 mg/dL — ABNORMAL HIGH (ref 65–99)
Potassium: 4.2 mmol/L (ref 3.5–5.1)
Sodium: 137 mmol/L (ref 135–145)
Total Bilirubin: 0.8 mg/dL (ref 0.3–1.2)
Total Protein: 8.4 g/dL — ABNORMAL HIGH (ref 6.5–8.1)

## 2015-07-26 LAB — URINALYSIS, ROUTINE W REFLEX MICROSCOPIC
Glucose, UA: NEGATIVE mg/dL
Hgb urine dipstick: NEGATIVE
Ketones, ur: NEGATIVE mg/dL
Nitrite: POSITIVE — AB
Protein, ur: NEGATIVE mg/dL
Specific Gravity, Urine: 1.044 — ABNORMAL HIGH (ref 1.005–1.030)
Urobilinogen, UA: 1 mg/dL (ref 0.0–1.0)
pH: 5.5 (ref 5.0–8.0)

## 2015-07-26 LAB — URINE MICROSCOPIC-ADD ON

## 2015-07-26 LAB — CBC WITH DIFFERENTIAL/PLATELET
Basophils Absolute: 0 10*3/uL (ref 0.0–0.1)
Basophils Relative: 0 % (ref 0–1)
Eosinophils Absolute: 0.2 10*3/uL (ref 0.0–0.7)
Eosinophils Relative: 1 % (ref 0–5)
HCT: 44.3 % (ref 39.0–52.0)
Hemoglobin: 15.1 g/dL (ref 13.0–17.0)
Lymphocytes Relative: 35 % (ref 12–46)
Lymphs Abs: 3.8 10*3/uL (ref 0.7–4.0)
MCH: 31 pg (ref 26.0–34.0)
MCHC: 34.1 g/dL (ref 30.0–36.0)
MCV: 91 fL (ref 78.0–100.0)
Monocytes Absolute: 1 10*3/uL (ref 0.1–1.0)
Monocytes Relative: 9 % (ref 3–12)
Neutro Abs: 5.9 10*3/uL (ref 1.7–7.7)
Neutrophils Relative %: 55 % (ref 43–77)
Platelets: 224 10*3/uL (ref 150–400)
RBC: 4.87 MIL/uL (ref 4.22–5.81)
RDW: 13.7 % (ref 11.5–15.5)
WBC: 10.9 10*3/uL — ABNORMAL HIGH (ref 4.0–10.5)

## 2015-07-26 MED ORDER — IOHEXOL 300 MG/ML  SOLN
50.0000 mL | Freq: Once | INTRAMUSCULAR | Status: AC | PRN
  Administered 2015-07-26: 50 mL via ORAL

## 2015-07-26 MED ORDER — HYDROMORPHONE HCL 1 MG/ML IJ SOLN
1.0000 mg | Freq: Once | INTRAMUSCULAR | Status: AC
  Administered 2015-07-26: 1 mg via INTRAVENOUS
  Filled 2015-07-26: qty 1

## 2015-07-26 MED ORDER — ONDANSETRON HCL 4 MG/2ML IJ SOLN
4.0000 mg | Freq: Once | INTRAMUSCULAR | Status: AC
  Administered 2015-07-26: 4 mg via INTRAVENOUS
  Filled 2015-07-26: qty 2

## 2015-07-26 MED ORDER — IOHEXOL 300 MG/ML  SOLN
100.0000 mL | Freq: Once | INTRAMUSCULAR | Status: AC | PRN
  Administered 2015-07-26: 100 mL via INTRAVENOUS

## 2015-07-26 MED ORDER — ONDANSETRON 4 MG PO TBDP: ORAL_TABLET | ORAL | Status: DC

## 2015-07-26 MED ORDER — HYDROCODONE-ACETAMINOPHEN 5-325 MG PO TABS: 1.0000 | ORAL_TABLET | Freq: Four times a day (QID) | ORAL | Status: DC | PRN

## 2015-07-26 MED ORDER — SODIUM CHLORIDE 0.9 % IV BOLUS (SEPSIS)
500.0000 mL | Freq: Once | INTRAVENOUS | Status: AC
  Administered 2015-07-26: 500 mL via INTRAVENOUS

## 2015-07-26 MED ORDER — LORAZEPAM 2 MG/ML IJ SOLN
0.5000 mg | Freq: Once | INTRAMUSCULAR | Status: AC
  Administered 2015-07-26: 0.5 mg via INTRAVENOUS
  Filled 2015-07-26: qty 1

## 2015-07-26 NOTE — ED Notes (Signed)
Pt states he has had abdominal pain since last night and has vomited x 10 times since last night. Pt states the pain travels all around his abdomen and through his groin.

## 2015-07-26 NOTE — ED Notes (Signed)
Pt to CT

## 2015-07-26 NOTE — ED Notes (Signed)
Pt complains of right flank pain since yesterday, he's been told that he has kidney stones, no blood in urine that he can see, urinary frequency
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
## 2015-07-27 ENCOUNTER — Emergency Department (HOSPITAL_COMMUNITY)
Admission: EM | Admit: 2015-07-27 | Discharge: 2015-07-28 | Disposition: A | Discharge status: Home / Self Care | Payer: Self-pay | Attending: Emergency Medicine | Admitting: Emergency Medicine

## 2015-07-27 ENCOUNTER — Encounter (HOSPITAL_COMMUNITY): Payer: Self-pay | Admitting: Emergency Medicine

## 2015-07-27 ENCOUNTER — Emergency Department (HOSPITAL_COMMUNITY): Payer: No Typology Code available for payment source

## 2015-07-27 DIAGNOSIS — R110 Nausea: Secondary | ICD-10-CM | POA: Insufficient documentation

## 2015-07-27 DIAGNOSIS — Z720 Tobacco use: Secondary | ICD-10-CM | POA: Insufficient documentation

## 2015-07-27 DIAGNOSIS — Z872 Personal history of diseases of the skin and subcutaneous tissue: Secondary | ICD-10-CM | POA: Insufficient documentation

## 2015-07-27 DIAGNOSIS — Z9889 Other specified postprocedural states: Secondary | ICD-10-CM | POA: Insufficient documentation

## 2015-07-27 DIAGNOSIS — N189 Chronic kidney disease, unspecified: Secondary | ICD-10-CM | POA: Insufficient documentation

## 2015-07-27 DIAGNOSIS — G8929 Other chronic pain: Secondary | ICD-10-CM | POA: Insufficient documentation

## 2015-07-27 DIAGNOSIS — Z79899 Other long term (current) drug therapy: Secondary | ICD-10-CM | POA: Insufficient documentation

## 2015-07-27 DIAGNOSIS — Z9049 Acquired absence of other specified parts of digestive tract: Secondary | ICD-10-CM | POA: Insufficient documentation

## 2015-07-27 DIAGNOSIS — R109 Unspecified abdominal pain: Secondary | ICD-10-CM | POA: Insufficient documentation

## 2015-07-27 LAB — CBC WITH DIFFERENTIAL/PLATELET
Basophils Absolute: 0.1 10*3/uL (ref 0.0–0.1)
Basophils Relative: 1 % (ref 0–1)
Eosinophils Absolute: 0.1 10*3/uL (ref 0.0–0.7)
Eosinophils Relative: 1 % (ref 0–5)
HCT: 45.9 % (ref 39.0–52.0)
Hemoglobin: 15.6 g/dL (ref 13.0–17.0)
Lymphocytes Relative: 31 % (ref 12–46)
Lymphs Abs: 3.3 10*3/uL (ref 0.7–4.0)
MCH: 30.8 pg (ref 26.0–34.0)
MCHC: 34 g/dL (ref 30.0–36.0)
MCV: 90.7 fL (ref 78.0–100.0)
Monocytes Absolute: 0.8 10*3/uL (ref 0.1–1.0)
Monocytes Relative: 7 % (ref 3–12)
Neutro Abs: 6.5 10*3/uL (ref 1.7–7.7)
Neutrophils Relative %: 60 % (ref 43–77)
Platelets: 243 10*3/uL (ref 150–400)
RBC: 5.06 MIL/uL (ref 4.22–5.81)
RDW: 13.7 % (ref 11.5–15.5)
WBC: 10.7 10*3/uL — ABNORMAL HIGH (ref 4.0–10.5)

## 2015-07-27 LAB — URINALYSIS, ROUTINE W REFLEX MICROSCOPIC
Glucose, UA: NEGATIVE mg/dL
Hgb urine dipstick: NEGATIVE
Ketones, ur: NEGATIVE mg/dL
Leukocytes, UA: NEGATIVE
Nitrite: NEGATIVE
Protein, ur: 30 mg/dL — AB
Specific Gravity, Urine: 1.042 — ABNORMAL HIGH (ref 1.005–1.030)
Urobilinogen, UA: 1 mg/dL (ref 0.0–1.0)
pH: 5.5 (ref 5.0–8.0)

## 2015-07-27 LAB — I-STAT CHEM 8, ED
BUN: 18 mg/dL (ref 6–20)
Calcium, Ion: 1.16 mmol/L (ref 1.12–1.23)
Chloride: 103 mmol/L (ref 101–111)
Creatinine, Ser: 1.3 mg/dL — ABNORMAL HIGH (ref 0.61–1.24)
Glucose, Bld: 119 mg/dL — ABNORMAL HIGH (ref 65–99)
HCT: 53 % — ABNORMAL HIGH (ref 39.0–52.0)
Hemoglobin: 18 g/dL — ABNORMAL HIGH (ref 13.0–17.0)
Potassium: 5.1 mmol/L (ref 3.5–5.1)
Sodium: 139 mmol/L (ref 135–145)
TCO2: 25 mmol/L (ref 0–100)

## 2015-07-27 LAB — URINE MICROSCOPIC-ADD ON

## 2015-07-27 MED ORDER — HYDROMORPHONE HCL 1 MG/ML IJ SOLN
1.0000 mg | Freq: Once | INTRAMUSCULAR | Status: AC
  Administered 2015-07-27: 1 mg via INTRAVENOUS
  Filled 2015-07-27: qty 1

## 2015-07-27 MED ORDER — ONDANSETRON HCL 4 MG/2ML IJ SOLN
4.0000 mg | Freq: Once | INTRAMUSCULAR | Status: AC
  Administered 2015-07-27: 4 mg via INTRAVENOUS
  Filled 2015-07-27: qty 2

## 2015-07-27 MED ORDER — SODIUM CHLORIDE 0.9 % IV SOLN
Freq: Once | INTRAVENOUS | Status: AC
  Administered 2015-07-27: 21:00:00 via INTRAVENOUS

## 2015-07-27 NOTE — ED Notes (Signed)
Informed the pt that a urine specimen is needed. 

## 2015-07-27 NOTE — Discharge Instructions (Signed)
Follow up with your md this week.  Take liquids only tomorrow.  Return if problems

## 2015-07-27 NOTE — ED Notes (Signed)
Bed: WA02 Expected date:  Expected time:  Means of arrival:  Comments: Diverted from 59/ 4M abd pain (chronic) did not scripts filled
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
## 2015-07-27 NOTE — ED Notes (Signed)
Pt was seen and evaluated yesterday for abdominal pain. Given rx for hydrocodone and zofran which he did not fill. Alert and oriented.

## 2015-07-27 NOTE — ED Notes (Signed)
Patient transported to X-ray 

## 2015-07-27 NOTE — ED Notes (Signed)
Pt provided with cranberry juice, sprite, and crackers. Pain significantly decreased.

## 2015-07-28 NOTE — Discharge Instructions (Signed)

## 2015-07-29 ENCOUNTER — Ambulatory Visit: Payer: No Typology Code available for payment source | Attending: Internal Medicine | Admitting: Clinical

## 2015-07-29 DIAGNOSIS — Z659 Problem related to unspecified psychosocial circumstances: Secondary | ICD-10-CM

## 2015-07-29 NOTE — Progress Notes (Signed)
ASSESSMENT: Pt currently experiencing psychosocial problems and would benefit from supportive counseling to cope with symptoms of psychosocial circumstances.  Stage of Change: contemplative  PLAN: 1. F/U with behavioral health consultant in as needed 2. Psychiatric Medications: none. 3. Behavioral recommendation(s):   -Go to [facility] for homeless verification letter (for orange card applications) -Contact his advocate, Vonna Kotyk, at [phone], to remind her to return Benjamin Casanas's call at [phone] SUBJECTIVE: Pt. referred by Dr Doreene Burke for healthcare navigation:  Pt. reports the following symptoms/concerns: Pt states that he has been attempting to 1. Obtain the orange card and 2. Obtain his jail medical records for his pending disability case.  Duration of problem: at least a month Severity: mild  OBJECTIVE: Orientation & Cognition: Oriented x3. Thought processes normal and appropriate to situation. Mood: appropriate. Affect: appropriate Appearance: appropriate Risk of harm to self or others: no risk of harm to self or others Substance use: tobacco Assessments administered: none  Diagnosis: Problem related to psychosocial circumstances CPT Code: Z65.9 -------------------------------------------- Other(s) present in the room: none  Time spent with patient in exam room: 20 minutes
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
## 2015-08-27 ENCOUNTER — Other Ambulatory Visit: Payer: Self-pay | Admitting: *Deleted

## 2015-08-27 DIAGNOSIS — R1031 Right lower quadrant pain: Secondary | ICD-10-CM

## 2015-08-27 DIAGNOSIS — R110 Nausea: Secondary | ICD-10-CM

## 2015-08-27 DIAGNOSIS — K2100 Gastro-esophageal reflux disease with esophagitis, without bleeding: Secondary | ICD-10-CM

## 2015-08-27 DIAGNOSIS — G8929 Other chronic pain: Secondary | ICD-10-CM

## 2015-08-27 MED ORDER — OMEPRAZOLE 20 MG PO CPDR: 20.0000 mg | DELAYED_RELEASE_CAPSULE | Freq: Every day | ORAL | Status: DC

## 2015-08-27 MED ORDER — ONDANSETRON 4 MG PO TBDP: ORAL_TABLET | ORAL | Status: DC

## 2015-08-27 MED ORDER — TRAMADOL HCL 50 MG PO TABS: 50.0000 mg | ORAL_TABLET | Freq: Two times a day (BID) | ORAL | Status: DC | PRN

## 2015-09-06 ENCOUNTER — Other Ambulatory Visit: Payer: Self-pay

## 2015-09-06 DIAGNOSIS — G8929 Other chronic pain: Secondary | ICD-10-CM

## 2015-09-06 DIAGNOSIS — R1031 Right lower quadrant pain: Principal | ICD-10-CM

## 2015-09-06 MED ORDER — TRAMADOL HCL 50 MG PO TABS: 50.0000 mg | ORAL_TABLET | Freq: Two times a day (BID) | ORAL | Status: DC | PRN

## 2015-09-07 ENCOUNTER — Emergency Department (HOSPITAL_COMMUNITY)
Admission: EM | Admit: 2015-09-07 | Discharge: 2015-09-07 | Discharge status: Against Medical Advice | Payer: No Typology Code available for payment source | Attending: Emergency Medicine | Admitting: Emergency Medicine

## 2015-09-07 ENCOUNTER — Encounter (HOSPITAL_COMMUNITY): Payer: Self-pay | Admitting: Emergency Medicine

## 2015-09-07 DIAGNOSIS — G8929 Other chronic pain: Secondary | ICD-10-CM | POA: Insufficient documentation

## 2015-09-07 DIAGNOSIS — R109 Unspecified abdominal pain: Secondary | ICD-10-CM | POA: Insufficient documentation

## 2015-09-07 DIAGNOSIS — N189 Chronic kidney disease, unspecified: Secondary | ICD-10-CM | POA: Insufficient documentation

## 2015-09-07 LAB — COMPREHENSIVE METABOLIC PANEL
ALT: 16 U/L — ABNORMAL LOW (ref 17–63)
AST: 25 U/L (ref 15–41)
Albumin: 4.8 g/dL (ref 3.5–5.0)
Alkaline Phosphatase: 92 U/L (ref 38–126)
Anion gap: 11 (ref 5–15)
BUN: 9 mg/dL (ref 6–20)
CO2: 26 mmol/L (ref 22–32)
Calcium: 9.9 mg/dL (ref 8.9–10.3)
Chloride: 100 mmol/L — ABNORMAL LOW (ref 101–111)
Creatinine, Ser: 1.15 mg/dL (ref 0.61–1.24)
GFR calc Af Amer: >60 mL/min (ref >60)
GFR calc non Af Amer: >60 mL/min (ref >60)
Glucose, Bld: 110 mg/dL — ABNORMAL HIGH (ref 65–99)
Potassium: 3.5 mmol/L (ref 3.5–5.1)
Sodium: 137 mmol/L (ref 135–145)
Total Bilirubin: 0.9 mg/dL (ref 0.3–1.2)
Total Protein: 8.9 g/dL — ABNORMAL HIGH (ref 6.5–8.1)

## 2015-09-07 LAB — CBC
HCT: 45.8 % (ref 39.0–52.0)
Hemoglobin: 16.1 g/dL (ref 13.0–17.0)
MCH: 31.8 pg (ref 26.0–34.0)
MCHC: 35.2 g/dL (ref 30.0–36.0)
MCV: 90.5 fL (ref 78.0–100.0)
Platelets: 270 10*3/uL (ref 150–400)
RBC: 5.06 MIL/uL (ref 4.22–5.81)
RDW: 13.2 % (ref 11.5–15.5)
WBC: 10.6 10*3/uL — ABNORMAL HIGH (ref 4.0–10.5)

## 2015-09-07 LAB — LIPASE, BLOOD: Lipase: 20 U/L — ABNORMAL LOW (ref 22–51)
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
## 2015-10-04 ENCOUNTER — Emergency Department (HOSPITAL_COMMUNITY)
Admission: EM | Admit: 2015-10-04 | Discharge: 2015-10-04 | Disposition: A | Discharge status: Home / Self Care | Payer: No Typology Code available for payment source | Attending: Emergency Medicine | Admitting: Emergency Medicine

## 2015-10-04 ENCOUNTER — Emergency Department (HOSPITAL_COMMUNITY): Payer: No Typology Code available for payment source

## 2015-10-04 ENCOUNTER — Encounter (HOSPITAL_COMMUNITY): Payer: Self-pay | Admitting: Emergency Medicine

## 2015-10-04 DIAGNOSIS — G8929 Other chronic pain: Secondary | ICD-10-CM | POA: Insufficient documentation

## 2015-10-04 DIAGNOSIS — N390 Urinary tract infection, site not specified: Secondary | ICD-10-CM | POA: Insufficient documentation

## 2015-10-04 DIAGNOSIS — Z79899 Other long term (current) drug therapy: Secondary | ICD-10-CM | POA: Insufficient documentation

## 2015-10-04 DIAGNOSIS — R109 Unspecified abdominal pain: Secondary | ICD-10-CM

## 2015-10-04 DIAGNOSIS — Z720 Tobacco use: Secondary | ICD-10-CM | POA: Insufficient documentation

## 2015-10-04 DIAGNOSIS — N189 Chronic kidney disease, unspecified: Secondary | ICD-10-CM | POA: Insufficient documentation

## 2015-10-04 LAB — COMPREHENSIVE METABOLIC PANEL
ALT: 18 U/L (ref 17–63)
AST: 21 U/L (ref 15–41)
Albumin: 4.1 g/dL (ref 3.5–5.0)
Alkaline Phosphatase: 86 U/L (ref 38–126)
Anion gap: 11 (ref 5–15)
BUN: 8 mg/dL (ref 6–20)
CO2: 22 mmol/L (ref 22–32)
Calcium: 10 mg/dL (ref 8.9–10.3)
Chloride: 107 mmol/L (ref 101–111)
Creatinine, Ser: 0.98 mg/dL (ref 0.61–1.24)
GFR calc Af Amer: >60 mL/min (ref >60)
GFR calc non Af Amer: >60 mL/min (ref >60)
Glucose, Bld: 106 mg/dL — ABNORMAL HIGH (ref 65–99)
Potassium: 3.7 mmol/L (ref 3.5–5.1)
Sodium: 140 mmol/L (ref 135–145)
Total Bilirubin: 0.9 mg/dL (ref 0.3–1.2)
Total Protein: 7.8 g/dL (ref 6.5–8.1)

## 2015-10-04 LAB — URINE MICROSCOPIC-ADD ON

## 2015-10-04 LAB — CBC WITH DIFFERENTIAL/PLATELET
Basophils Absolute: 0 10*3/uL (ref 0.0–0.1)
Basophils Relative: 0 %
Eosinophils Absolute: 0.1 10*3/uL (ref 0.0–0.7)
Eosinophils Relative: 1 %
HCT: 41 % (ref 39.0–52.0)
Hemoglobin: 13.8 g/dL (ref 13.0–17.0)
Lymphocytes Relative: 32 %
Lymphs Abs: 2.5 10*3/uL (ref 0.7–4.0)
MCH: 30.5 pg (ref 26.0–34.0)
MCHC: 33.7 g/dL (ref 30.0–36.0)
MCV: 90.7 fL (ref 78.0–100.0)
Monocytes Absolute: 0.5 10*3/uL (ref 0.1–1.0)
Monocytes Relative: 7 %
Neutro Abs: 4.8 10*3/uL (ref 1.7–7.7)
Neutrophils Relative %: 60 %
Platelets: 202 10*3/uL (ref 150–400)
RBC: 4.52 MIL/uL (ref 4.22–5.81)
RDW: 13.4 % (ref 11.5–15.5)
WBC: 8 10*3/uL (ref 4.0–10.5)

## 2015-10-04 LAB — URINALYSIS, ROUTINE W REFLEX MICROSCOPIC
Glucose, UA: NEGATIVE mg/dL
Hgb urine dipstick: NEGATIVE
Ketones, ur: 15 mg/dL — AB
Nitrite: POSITIVE — AB
Protein, ur: 30 mg/dL — AB
Specific Gravity, Urine: 1.037 — ABNORMAL HIGH (ref 1.005–1.030)
Urobilinogen, UA: 1 mg/dL (ref 0.0–1.0)
pH: 5.5 (ref 5.0–8.0)

## 2015-10-04 LAB — LIPASE, BLOOD: Lipase: 21 U/L (ref 11–51)

## 2015-10-04 MED ORDER — CEPHALEXIN 500 MG PO CAPS: 500.0000 mg | ORAL_CAPSULE | Freq: Four times a day (QID) | ORAL | Status: DC

## 2015-10-04 MED ORDER — HALOPERIDOL LACTATE 5 MG/ML IJ SOLN
5.0000 mg | Freq: Once | INTRAMUSCULAR | Status: AC
  Administered 2015-10-04: 5 mg via INTRAMUSCULAR
  Filled 2015-10-04: qty 1

## 2015-10-04 MED ORDER — ONDANSETRON 4 MG PO TBDP
4.0000 mg | ORAL_TABLET | Freq: Once | ORAL | Status: AC
  Administered 2015-10-04: 4 mg via ORAL
  Filled 2015-10-04: qty 1

## 2015-10-04 MED ORDER — DICYCLOMINE HCL 10 MG/ML IM SOLN
20.0000 mg | Freq: Once | INTRAMUSCULAR | Status: AC
  Administered 2015-10-04: 20 mg via INTRAMUSCULAR
  Filled 2015-10-04: qty 2

## 2015-10-04 MED ORDER — TRAMADOL HCL 50 MG PO TABS
50.0000 mg | ORAL_TABLET | Freq: Once | ORAL | Status: AC
  Administered 2015-10-04: 50 mg via ORAL
  Filled 2015-10-04: qty 1

## 2015-10-04 MED ORDER — LIDOCAINE HCL (PF) 1 % IJ SOLN
5.0000 mL | Freq: Once | INTRAMUSCULAR | Status: AC
  Administered 2015-10-04: 5 mL
  Filled 2015-10-04: qty 5

## 2015-10-04 MED ORDER — CEFTRIAXONE SODIUM 1 G IJ SOLR
1.0000 g | INTRAMUSCULAR | Status: DC
  Administered 2015-10-04: 1 g via INTRAMUSCULAR
  Filled 2015-10-04: qty 10

## 2015-10-04 NOTE — Discharge Instructions (Signed)
Chronic Pain  Chronic pain can be defined as pain that is off and on and lasts for 3-6 months or longer. Many things cause chronic pain, which can make it difficult to make a diagnosis. There are many treatment options available for chronic pain. However, finding a treatment that works well for you may require trying various approaches until the right one is found. Many people benefit from a combination of two or more types of treatment to control their pain.  SYMPTOMS   Chronic pain can occur anywhere in the body and can range from mild to very severe. Some types of chronic pain include:  · Headache.  · Low back pain.  · Cancer pain.  · Arthritis pain.  · Neurogenic pain. This is pain resulting from damage to nerves.   People with chronic pain may also have other symptoms such as:  · Depression.  · Anger.  · Insomnia.  · Anxiety.  DIAGNOSIS   Your health care provider will help diagnose your condition over time. In many cases, the initial focus will be on excluding possible conditions that could be causing the pain. Depending on your symptoms, your health care provider may order tests to diagnose your condition. Some of these tests may include:   · Blood tests.    · CT scan.    · MRI.    · X-rays.    · Ultrasounds.    · Nerve conduction studies.    You may need to see a specialist.   TREATMENT   Finding treatment that works well may take time. You may be referred to a pain specialist. He or she may prescribe medicine or therapies, such as:   · Mindful meditation or yoga.  · Shots (injections) of numbing or pain-relieving medicines into the spine or area of pain.  · Local electrical stimulation.  · Acupuncture.    · Massage therapy.    · Aroma, color, light, or sound therapy.    · Biofeedback.    · Working with a physical therapist to keep from getting stiff.    · Regular, gentle exercise.    · Cognitive or behavioral therapy.    · Group support.    Sometimes, surgery may be recommended.   HOME CARE INSTRUCTIONS    · Take all medicines as directed by your health care provider.    · Lessen stress in your life by relaxing and doing things such as listening to calming music.    · Exercise or be active as directed by your health care provider.    · Eat a healthy diet and include things such as vegetables, fruits, fish, and lean meats in your diet.    · Keep all follow-up appointments with your health care provider.    · Attend a support group with others suffering from chronic pain.  SEEK MEDICAL CARE IF:   · Your pain gets worse.    · You develop a new pain that was not there before.    · You cannot tolerate medicines given to you by your health care provider.    · You have new symptoms since your last visit with your health care provider.    SEEK IMMEDIATE MEDICAL CARE IF:   · You feel weak.    · You have decreased sensation or numbness.    · You lose control of bowel or bladder function.    · Your pain suddenly gets much worse.    · You develop shaking.  · You develop chills.  · You develop confusion.  · You develop chest pain.  · You develop shortness of breath.    MAKE SURE YOU:  ·   Document Revised: 07/23/2013 Document Reviewed: 05/16/2013 Elsevier Interactive Patient Education 2016 Elsevier Inc.  Urinary Tract Infection Urinary tract infections (UTIs) can develop anywhere along your urinary tract. Your urinary tract is your body's drainage system for removing wastes and extra water. Your urinary tract includes two kidneys, two ureters, a bladder, and a urethra. Your kidneys are a pair of bean-shaped organs. Each kidney is about the size of your fist. They are located below your ribs,  one on each side of your spine. CAUSES Infections are caused by microbes, which are microscopic organisms, including fungi, viruses, and bacteria. These organisms are so small that they can only be seen through a microscope. Bacteria are the microbes that most commonly cause UTIs. SYMPTOMS  Symptoms of UTIs may vary by age and gender of the patient and by the location of the infection. Symptoms in young women typically include a frequent and intense urge to urinate and a painful, burning feeling in the bladder or urethra during urination. Older women and men are more likely to be tired, shaky, and weak and have muscle aches and abdominal pain. A fever may mean the infection is in your kidneys. Other symptoms of a kidney infection include pain in your back or sides below the ribs, nausea, and vomiting. DIAGNOSIS To diagnose a UTI, your caregiver will ask you about your symptoms. Your caregiver will also ask you to provide a urine sample. The urine sample will be tested for bacteria and white blood cells. White blood cells are made by your body to help fight infection. TREATMENT  Typically, UTIs can be treated with medication. Because most UTIs are caused by a bacterial infection, they usually can be treated with the use of antibiotics. The choice of antibiotic and length of treatment depend on your symptoms and the type of bacteria causing your infection. HOME CARE INSTRUCTIONS  If you were prescribed antibiotics, take them exactly as your caregiver instructs you. Finish the medication even if you feel better after you have only taken some of the medication.  Drink enough water and fluids to keep your urine clear or pale yellow.  Avoid caffeine, tea, and carbonated beverages. They tend to irritate your bladder.  Empty your bladder often. Avoid holding urine for long periods of time.  Empty your bladder before and after sexual intercourse.  After a bowel movement, women should cleanse from front  to back. Use each tissue only once. SEEK MEDICAL CARE IF:   You have back pain.  You develop a fever.  Your symptoms do not begin to resolve within 3 days. SEEK IMMEDIATE MEDICAL CARE IF:   You have severe back pain or lower abdominal pain.  You develop chills.  You have nausea or vomiting.  You have continued burning or discomfort with urination. MAKE SURE YOU:   Understand these instructions.  Will watch your condition.  Will get help right away if you are not doing well or get worse.   This information is not intended to replace advice given to you by your health care provider. Make sure you discuss any questions you have with your health care provider.   Document Released: 08/30/2005 Document Revised: 08/11/2015 Document Reviewed: 12/29/2011 Elsevier Interactive Patient Education [employer].
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
## 2015-10-04 NOTE — ED Notes (Signed)
Pt called out reporting that he was still having pain, that the medication he received was not helping. Pt is laying on his back, with his knees to his chest, rolling around, moaning and yelling in pain.

## 2015-10-04 NOTE — ED Notes (Signed)
Lab at the bedside 

## 2015-10-04 NOTE — ED Notes (Signed)
Pt off unit for xray
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
## 2015-10-04 NOTE — ED Notes (Signed)
Went over discharge instructions and administered injection. Pt is ready for discharge after 30 minute post injection waiting period.

## 2015-10-04 NOTE — ED Notes (Addendum)
Pt lying on floor willingly. Refuses to wear BP cuff and O2 sensor and removed yellow fall precaution socks. Asked pt to return to bed for IM injection. He complied only after being informed that the injection would not be given on the floor, however, he returned to the floor as soon as completed. Pt continues to moan at high volumes intermittently between normal and calm conversation and behaviors.

## 2015-10-05 ENCOUNTER — Encounter (HOSPITAL_COMMUNITY): Payer: Self-pay | Admitting: Emergency Medicine

## 2015-10-05 ENCOUNTER — Emergency Department (HOSPITAL_COMMUNITY)
Admission: EM | Admit: 2015-10-05 | Discharge: 2015-10-05 | Discharge status: Against Medical Advice | Payer: No Typology Code available for payment source | Attending: Emergency Medicine | Admitting: Emergency Medicine

## 2015-10-05 DIAGNOSIS — R109 Unspecified abdominal pain: Secondary | ICD-10-CM | POA: Insufficient documentation

## 2015-10-05 DIAGNOSIS — Z720 Tobacco use: Secondary | ICD-10-CM | POA: Insufficient documentation

## 2015-10-05 DIAGNOSIS — G8929 Other chronic pain: Secondary | ICD-10-CM | POA: Insufficient documentation

## 2015-10-05 DIAGNOSIS — N189 Chronic kidney disease, unspecified: Secondary | ICD-10-CM | POA: Insufficient documentation

## 2015-10-05 LAB — URINE CULTURE: Culture: NO GROWTH

## 2015-10-05 NOTE — ED Notes (Signed)
Called pt again in the lobby and also outside. Did not see pt or hear any response. Registration thinks pt left.
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
## 2015-10-05 NOTE — ED Notes (Signed)
Called for pt to go back to room. Did not see pt. Pt's blankets left on triage table.

## 2015-10-06 ENCOUNTER — Emergency Department (HOSPITAL_COMMUNITY)
Admission: EM | Admit: 2015-10-06 | Discharge: 2015-10-06 | Disposition: A | Discharge status: Home / Self Care | Payer: No Typology Code available for payment source | Attending: Emergency Medicine | Admitting: Emergency Medicine

## 2015-10-06 ENCOUNTER — Encounter (HOSPITAL_COMMUNITY): Payer: Self-pay | Admitting: Emergency Medicine

## 2015-10-06 ENCOUNTER — Other Ambulatory Visit: Payer: Self-pay | Admitting: *Deleted

## 2015-10-06 ENCOUNTER — Telehealth: Payer: Self-pay | Admitting: Internal Medicine

## 2015-10-06 DIAGNOSIS — N189 Chronic kidney disease, unspecified: Secondary | ICD-10-CM | POA: Insufficient documentation

## 2015-10-06 DIAGNOSIS — R110 Nausea: Secondary | ICD-10-CM

## 2015-10-06 DIAGNOSIS — G8929 Other chronic pain: Secondary | ICD-10-CM

## 2015-10-06 DIAGNOSIS — M62838 Other muscle spasm: Secondary | ICD-10-CM

## 2015-10-06 DIAGNOSIS — Z79899 Other long term (current) drug therapy: Secondary | ICD-10-CM | POA: Insufficient documentation

## 2015-10-06 DIAGNOSIS — Z720 Tobacco use: Secondary | ICD-10-CM | POA: Insufficient documentation

## 2015-10-06 DIAGNOSIS — Z872 Personal history of diseases of the skin and subcutaneous tissue: Secondary | ICD-10-CM | POA: Insufficient documentation

## 2015-10-06 DIAGNOSIS — R1031 Right lower quadrant pain: Secondary | ICD-10-CM

## 2015-10-06 DIAGNOSIS — Z8719 Personal history of other diseases of the digestive system: Secondary | ICD-10-CM | POA: Insufficient documentation

## 2015-10-06 DIAGNOSIS — R300 Dysuria: Secondary | ICD-10-CM

## 2015-10-06 LAB — URINALYSIS, ROUTINE W REFLEX MICROSCOPIC
Glucose, UA: NEGATIVE mg/dL
Ketones, ur: NEGATIVE mg/dL
Leukocytes, UA: NEGATIVE
Nitrite: NEGATIVE
Protein, ur: 30 mg/dL — AB
Specific Gravity, Urine: 1.039 — ABNORMAL HIGH (ref 1.005–1.030)
Urobilinogen, UA: 0.2 mg/dL (ref 0.0–1.0)
pH: 6 (ref 5.0–8.0)

## 2015-10-06 LAB — URINE MICROSCOPIC-ADD ON

## 2015-10-06 MED ORDER — HALOPERIDOL LACTATE 5 MG/ML IJ SOLN
5.0000 mg | Freq: Once | INTRAMUSCULAR | Status: AC
  Administered 2015-10-06: 5 mg via INTRAMUSCULAR
  Filled 2015-10-06: qty 1

## 2015-10-06 MED ORDER — CEPHALEXIN 500 MG PO CAPS
500.0000 mg | ORAL_CAPSULE | Freq: Once | ORAL | Status: AC
  Administered 2015-10-06: 500 mg via ORAL
  Filled 2015-10-06: qty 1

## 2015-10-06 MED ORDER — TRAMADOL HCL 50 MG PO TABS
50.0000 mg | ORAL_TABLET | Freq: Once | ORAL | Status: AC
  Administered 2015-10-06: 50 mg via ORAL
  Filled 2015-10-06: qty 1

## 2015-10-06 MED ORDER — CYCLOBENZAPRINE HCL 10 MG PO TABS
10.0000 mg | ORAL_TABLET | Freq: Three times a day (TID) | ORAL | Status: DC | PRN
Start: 2015-10-06 — End: 2015-12-16

## 2015-10-06 MED ORDER — TRAMADOL HCL 50 MG PO TABS: 50.0000 mg | ORAL_TABLET | Freq: Two times a day (BID) | ORAL | Status: DC | PRN

## 2015-10-06 MED ORDER — ONDANSETRON 4 MG PO TBDP: ORAL_TABLET | ORAL | Status: DC

## 2015-10-06 NOTE — ED Notes (Signed)
Bed: WA21 Expected date:  Expected time:  Means of arrival:  Comments: Ems abdominal pain

## 2015-10-06 NOTE — Discharge Instructions (Signed)

## 2015-10-06 NOTE — Progress Notes (Signed)
Per chart review, patient has been seen in the ED 12 times within the last six months.  This patient is seen by Dr. Doreene Burke at the [facility].  Per chart review patient called the clinic today asking for zofran.  Patient was prescribed zofran and also received refill of flexeril and tramadol.

## 2015-10-06 NOTE — ED Notes (Signed)
Per EMS-patient c/o generalized abdominal pain worse in RLQ and LUQ. Diagnosed with UTI 2 days ago but did not pick up his medications. Says pain is intermittent. Ambulatory on scene. Has nausea but no V/D. VS 136/92 HR 82 RR 20 SpO2 98% on RA.
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
## 2015-10-08 LAB — URINE CULTURE: Culture: NO GROWTH

## 2015-10-10 ENCOUNTER — Emergency Department (HOSPITAL_COMMUNITY)
Admission: EM | Admit: 2015-10-10 | Discharge: 2015-10-10 | Disposition: A | Discharge status: Home / Self Care | Payer: No Typology Code available for payment source | Attending: Emergency Medicine | Admitting: Emergency Medicine

## 2015-10-10 ENCOUNTER — Encounter (HOSPITAL_COMMUNITY): Payer: Self-pay | Admitting: Emergency Medicine

## 2015-10-10 DIAGNOSIS — N189 Chronic kidney disease, unspecified: Secondary | ICD-10-CM | POA: Insufficient documentation

## 2015-10-10 DIAGNOSIS — J029 Acute pharyngitis, unspecified: Secondary | ICD-10-CM | POA: Insufficient documentation

## 2015-10-10 DIAGNOSIS — Z8619 Personal history of other infectious and parasitic diseases: Secondary | ICD-10-CM | POA: Insufficient documentation

## 2015-10-10 DIAGNOSIS — R112 Nausea with vomiting, unspecified: Secondary | ICD-10-CM | POA: Insufficient documentation

## 2015-10-10 DIAGNOSIS — R39198 Other difficulties with micturition: Secondary | ICD-10-CM | POA: Insufficient documentation

## 2015-10-10 DIAGNOSIS — Z792 Long term (current) use of antibiotics: Secondary | ICD-10-CM | POA: Insufficient documentation

## 2015-10-10 DIAGNOSIS — Z720 Tobacco use: Secondary | ICD-10-CM | POA: Insufficient documentation

## 2015-10-10 DIAGNOSIS — Z9049 Acquired absence of other specified parts of digestive tract: Secondary | ICD-10-CM | POA: Insufficient documentation

## 2015-10-10 DIAGNOSIS — R5383 Other fatigue: Secondary | ICD-10-CM | POA: Insufficient documentation

## 2015-10-10 DIAGNOSIS — Z87442 Personal history of urinary calculi: Secondary | ICD-10-CM | POA: Insufficient documentation

## 2015-10-10 DIAGNOSIS — R197 Diarrhea, unspecified: Secondary | ICD-10-CM | POA: Insufficient documentation

## 2015-10-10 DIAGNOSIS — R509 Fever, unspecified: Secondary | ICD-10-CM | POA: Insufficient documentation

## 2015-10-10 DIAGNOSIS — Z8719 Personal history of other diseases of the digestive system: Secondary | ICD-10-CM | POA: Insufficient documentation

## 2015-10-10 DIAGNOSIS — R1031 Right lower quadrant pain: Secondary | ICD-10-CM | POA: Insufficient documentation

## 2015-10-10 DIAGNOSIS — G8929 Other chronic pain: Secondary | ICD-10-CM | POA: Insufficient documentation

## 2015-10-10 DIAGNOSIS — Z9889 Other specified postprocedural states: Secondary | ICD-10-CM | POA: Insufficient documentation

## 2015-10-10 LAB — COMPREHENSIVE METABOLIC PANEL
ALT: 24 U/L (ref 17–63)
AST: 34 U/L (ref 15–41)
Albumin: 4.3 g/dL (ref 3.5–5.0)
Alkaline Phosphatase: 88 U/L (ref 38–126)
Anion gap: 12 (ref 5–15)
BUN: 14 mg/dL (ref 6–20)
CO2: 25 mmol/L (ref 22–32)
Calcium: 9.6 mg/dL (ref 8.9–10.3)
Chloride: 98 mmol/L — ABNORMAL LOW (ref 101–111)
Creatinine, Ser: 1.01 mg/dL (ref 0.61–1.24)
GFR calc Af Amer: >60 mL/min (ref >60)
GFR calc non Af Amer: >60 mL/min (ref >60)
Glucose, Bld: 90 mg/dL (ref 65–99)
Potassium: 3.8 mmol/L (ref 3.5–5.1)
Sodium: 135 mmol/L (ref 135–145)
Total Bilirubin: 0.8 mg/dL (ref 0.3–1.2)
Total Protein: 8.1 g/dL (ref 6.5–8.1)

## 2015-10-10 LAB — CBC
HCT: 44.8 % (ref 39.0–52.0)
Hemoglobin: 15.7 g/dL (ref 13.0–17.0)
MCH: 31.4 pg (ref 26.0–34.0)
MCHC: 35 g/dL (ref 30.0–36.0)
MCV: 89.6 fL (ref 78.0–100.0)
Platelets: 231 10*3/uL (ref 150–400)
RBC: 5 MIL/uL (ref 4.22–5.81)
RDW: 12.6 % (ref 11.5–15.5)
WBC: 9 10*3/uL (ref 4.0–10.5)

## 2015-10-10 LAB — URINALYSIS, ROUTINE W REFLEX MICROSCOPIC
Glucose, UA: NEGATIVE mg/dL
Hgb urine dipstick: NEGATIVE
Ketones, ur: NEGATIVE mg/dL
Leukocytes, UA: NEGATIVE
Nitrite: NEGATIVE
Protein, ur: 30 mg/dL — AB
Specific Gravity, Urine: >1.046 — ABNORMAL HIGH (ref 1.005–1.030)
Urobilinogen, UA: 1 mg/dL (ref 0.0–1.0)
pH: 6 (ref 5.0–8.0)

## 2015-10-10 LAB — URINE MICROSCOPIC-ADD ON

## 2015-10-10 LAB — LIPASE, BLOOD: Lipase: 24 U/L (ref 11–51)

## 2015-10-10 MED ORDER — SODIUM CHLORIDE 0.9 % IV BOLUS (SEPSIS)
1000.0000 mL | Freq: Once | INTRAVENOUS | Status: AC
Start: 2015-10-10 — End: 2015-10-10
  Administered 2015-10-10: 1000 mL via INTRAVENOUS

## 2015-10-10 MED ORDER — ONDANSETRON 4 MG PO TBDP
4.0000 mg | ORAL_TABLET | Freq: Once | ORAL | Status: AC
  Administered 2015-10-10: 4 mg via ORAL
  Filled 2015-10-10: qty 1

## 2015-10-10 MED ORDER — DICYCLOMINE HCL 20 MG PO TABS: 20.0000 mg | ORAL_TABLET | Freq: Two times a day (BID) | ORAL | Status: DC

## 2015-10-10 MED ORDER — HYDROCODONE-ACETAMINOPHEN 5-325 MG PO TABS
1.0000 | ORAL_TABLET | Freq: Once | ORAL | Status: AC
  Administered 2015-10-10: 1 via ORAL
  Filled 2015-10-10 (×2): qty 1

## 2015-10-10 MED ORDER — ONDANSETRON 4 MG PO TBDP: 4.0000 mg | ORAL_TABLET | Freq: Three times a day (TID) | ORAL | Status: DC | PRN

## 2015-10-10 MED ORDER — HYDROMORPHONE HCL 1 MG/ML IJ SOLN
1.0000 mg | Freq: Once | INTRAMUSCULAR | Status: AC
  Administered 2015-10-10: 1 mg via INTRAVENOUS
  Filled 2015-10-10: qty 1

## 2015-10-10 MED ORDER — DICYCLOMINE HCL 10 MG/ML IM SOLN
20.0000 mg | Freq: Once | INTRAMUSCULAR | Status: AC
  Administered 2015-10-10: 20 mg via INTRAMUSCULAR
  Filled 2015-10-10: qty 2

## 2015-10-10 NOTE — ED Notes (Addendum)
Pt reported having abd pain. Pt was seeing here last week and dx with UTI. Pt was started on new medication (Keflex) and reported having n/v x10 and diarrhea x2 in past 24hrs since. Pt reported to continue to have urinary frequency/urgency, pressure and foul odor with void, and no hematuria

## 2015-10-10 NOTE — Discharge Instructions (Signed)

## 2015-10-10 NOTE — ED Notes (Signed)
2 attempts at IV placement by this RN

## 2015-10-10 NOTE — ED Notes (Signed)
Pt has been stuck multiple times, unable to get blood at this time. Pt to receive fluids and will attempt again.
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
## 2015-10-10 NOTE — ED Notes (Signed)
Pt concerned he would not be able to eat or drink when he got home.  Pt given cheese stick and Sprite for PO challenge.  Pt had eaten Saltine crackers earlier (at [employer]) with no trouble

## 2015-10-10 NOTE — ED Notes (Signed)
MD at bedside. 

## 2015-11-20 ENCOUNTER — Emergency Department (HOSPITAL_COMMUNITY): Payer: Self-pay

## 2015-11-20 ENCOUNTER — Emergency Department (HOSPITAL_COMMUNITY): Payer: No Typology Code available for payment source

## 2015-11-20 ENCOUNTER — Encounter (HOSPITAL_COMMUNITY): Payer: Self-pay

## 2015-11-20 ENCOUNTER — Emergency Department (HOSPITAL_COMMUNITY)
Admission: EM | Admit: 2015-11-20 | Discharge: 2015-11-20 | Discharge status: Against Medical Advice | Payer: Self-pay | Attending: Emergency Medicine | Admitting: Emergency Medicine

## 2015-11-20 DIAGNOSIS — G8929 Other chronic pain: Secondary | ICD-10-CM | POA: Insufficient documentation

## 2015-11-20 DIAGNOSIS — N189 Chronic kidney disease, unspecified: Secondary | ICD-10-CM | POA: Insufficient documentation

## 2015-11-20 DIAGNOSIS — F17210 Nicotine dependence, cigarettes, uncomplicated: Secondary | ICD-10-CM | POA: Insufficient documentation

## 2015-11-20 DIAGNOSIS — R1030 Lower abdominal pain, unspecified: Secondary | ICD-10-CM | POA: Insufficient documentation

## 2015-11-20 DIAGNOSIS — Z9049 Acquired absence of other specified parts of digestive tract: Secondary | ICD-10-CM | POA: Insufficient documentation

## 2015-11-20 DIAGNOSIS — Z9889 Other specified postprocedural states: Secondary | ICD-10-CM | POA: Insufficient documentation

## 2015-11-20 DIAGNOSIS — R109 Unspecified abdominal pain: Secondary | ICD-10-CM

## 2015-11-20 LAB — COMPREHENSIVE METABOLIC PANEL
ALT: 15 U/L — ABNORMAL LOW (ref 17–63)
AST: 21 U/L (ref 15–41)
Albumin: 3.6 g/dL (ref 3.5–5.0)
Alkaline Phosphatase: 70 U/L (ref 38–126)
Anion gap: 8 (ref 5–15)
BUN: 9 mg/dL (ref 6–20)
CO2: 25 mmol/L (ref 22–32)
Calcium: 9.4 mg/dL (ref 8.9–10.3)
Chloride: 109 mmol/L (ref 101–111)
Creatinine, Ser: 1.02 mg/dL (ref 0.61–1.24)
GFR calc Af Amer: >60 mL/min (ref >60)
GFR calc non Af Amer: >60 mL/min (ref >60)
Glucose, Bld: 123 mg/dL — ABNORMAL HIGH (ref 65–99)
Potassium: 3.8 mmol/L (ref 3.5–5.1)
Sodium: 142 mmol/L (ref 135–145)
Total Bilirubin: 0.8 mg/dL (ref 0.3–1.2)
Total Protein: 6.7 g/dL (ref 6.5–8.1)

## 2015-11-20 LAB — CBC WITH DIFFERENTIAL/PLATELET
Basophils Absolute: 0 10*3/uL (ref 0.0–0.1)
Basophils Relative: 0 %
Eosinophils Absolute: 0.2 10*3/uL (ref 0.0–0.7)
Eosinophils Relative: 2 %
HCT: 41.8 % (ref 39.0–52.0)
Hemoglobin: 14.2 g/dL (ref 13.0–17.0)
Lymphocytes Relative: 27 %
Lymphs Abs: 2.5 10*3/uL (ref 0.7–4.0)
MCH: 31 pg (ref 26.0–34.0)
MCHC: 34 g/dL (ref 30.0–36.0)
MCV: 91.3 fL (ref 78.0–100.0)
Monocytes Absolute: 0.7 10*3/uL (ref 0.1–1.0)
Monocytes Relative: 7 %
Neutro Abs: 6.1 10*3/uL (ref 1.7–7.7)
Neutrophils Relative %: 64 %
Platelets: 234 10*3/uL (ref 150–400)
RBC: 4.58 MIL/uL (ref 4.22–5.81)
RDW: 13.8 % (ref 11.5–15.5)
WBC: 9.5 10*3/uL (ref 4.0–10.5)

## 2015-11-20 LAB — URINALYSIS, ROUTINE W REFLEX MICROSCOPIC
Glucose, UA: NEGATIVE mg/dL
Hgb urine dipstick: NEGATIVE
Ketones, ur: 15 mg/dL — AB
Leukocytes, UA: NEGATIVE
Nitrite: NEGATIVE
Protein, ur: NEGATIVE mg/dL
Specific Gravity, Urine: >1.046 — ABNORMAL HIGH (ref 1.005–1.030)
pH: 5.5 (ref 5.0–8.0)

## 2015-11-20 LAB — LIPASE, BLOOD: Lipase: 35 U/L (ref 11–51)

## 2015-11-20 MED ORDER — ONDANSETRON HCL 4 MG/2ML IJ SOLN
4.0000 mg | Freq: Once | INTRAMUSCULAR | Status: AC
  Administered 2015-11-20: 4 mg via INTRAVENOUS
  Filled 2015-11-20: qty 2

## 2015-11-20 MED ORDER — ONDANSETRON 8 MG PO TBDP: 8.0000 mg | ORAL_TABLET | Freq: Three times a day (TID) | ORAL | Status: DC | PRN

## 2015-11-20 MED ORDER — SODIUM CHLORIDE 0.9 % IV BOLUS (SEPSIS): 1000.0000 mL | Freq: Once | INTRAVENOUS | Status: DC

## 2015-11-20 MED ORDER — HYDROMORPHONE HCL 1 MG/ML IJ SOLN
1.0000 mg | Freq: Once | INTRAMUSCULAR | Status: AC
  Administered 2015-11-20: 1 mg via INTRAVENOUS
  Filled 2015-11-20: qty 1

## 2015-11-20 MED ORDER — HALOPERIDOL LACTATE 5 MG/ML IJ SOLN
2.0000 mg | Freq: Once | INTRAMUSCULAR | Status: AC
Start: 2015-11-20 — End: 2015-11-20
  Administered 2015-11-20: 2 mg via INTRAVENOUS
  Filled 2015-11-20: qty 1

## 2015-11-20 MED ORDER — IOHEXOL 300 MG/ML  SOLN
100.0000 mL | Freq: Once | INTRAMUSCULAR | Status: AC | PRN
  Administered 2015-11-20: 100 mL via INTRAVENOUS

## 2015-11-20 MED ORDER — SODIUM CHLORIDE 0.9 % IV BOLUS (SEPSIS)
1000.0000 mL | Freq: Once | INTRAVENOUS | Status: AC
  Administered 2015-11-20: 1000 mL via INTRAVENOUS

## 2015-11-20 MED ORDER — LORAZEPAM 2 MG/ML IJ SOLN
1.0000 mg | Freq: Once | INTRAMUSCULAR | Status: AC
  Administered 2015-11-20: 1 mg via INTRAVENOUS
  Filled 2015-11-20: qty 1

## 2015-11-20 NOTE — ED Provider Notes (Signed)
9:22 AM Pt feels better at this time after IV haldol. Tolerating fluids. Dc home in good condition  Jola Schmidt, MD 11/20/15 [phone]

## 2015-11-20 NOTE — ED Notes (Signed)
Pt here for abd pain hx of same, per ems no bowel sounds noted. Loose BM today. Pain worse over last 3 hours.

## 2015-11-20 NOTE — ED Notes (Signed)
Called CT, patient cannot have scan until 0730 as he is a "drinker" for testing.

## 2015-11-20 NOTE — ED Notes (Signed)
Dr. Claudine Mouton speaking with patient

## 2015-11-20 NOTE — ED Notes (Signed)
Pt continues to take his bp cuff off, pt laying on the floor kicking in the air.  MD aware.

## 2015-11-20 NOTE — ED Notes (Signed)
Pt not in room when giving Discharge paperwork.  An employee in the front of waiting room saw pt walking out of front door.  Pt had an IV in his left forearm that had not been removed prior to leaving.  Pt left AMA.

## 2015-11-20 NOTE — ED Notes (Signed)
Pt continues to be non compliant with getting his vitals.  Pt laying upside down in bed kicking in the air.

## 2015-11-20 NOTE — ED Notes (Addendum)
Pt off unit with CT 

## 2015-11-20 NOTE — ED Notes (Signed)
Spoke with IV team, stated pt is on the list but they are very busy.

## 2015-11-20 NOTE — ED Notes (Signed)
Requested urine from pt, urinal at bedside.

## 2015-11-20 NOTE — ED Notes (Signed)
IV team at bedside 

## 2015-11-20 NOTE — ED Notes (Signed)
Patient transported to X-ray 

## 2015-12-05 ENCOUNTER — Encounter (HOSPITAL_COMMUNITY): Payer: Self-pay | Admitting: Emergency Medicine

## 2015-12-05 ENCOUNTER — Emergency Department (HOSPITAL_COMMUNITY)
Admission: EM | Admit: 2015-12-05 | Discharge: 2015-12-06 | Disposition: A | Discharge status: Home / Self Care | Payer: No Typology Code available for payment source | Attending: Emergency Medicine | Admitting: Emergency Medicine

## 2015-12-05 DIAGNOSIS — R197 Diarrhea, unspecified: Secondary | ICD-10-CM | POA: Insufficient documentation

## 2015-12-05 DIAGNOSIS — G8929 Other chronic pain: Secondary | ICD-10-CM | POA: Insufficient documentation

## 2015-12-05 DIAGNOSIS — Z8719 Personal history of other diseases of the digestive system: Secondary | ICD-10-CM | POA: Insufficient documentation

## 2015-12-05 DIAGNOSIS — Z79899 Other long term (current) drug therapy: Secondary | ICD-10-CM | POA: Insufficient documentation

## 2015-12-05 DIAGNOSIS — Z872 Personal history of diseases of the skin and subcutaneous tissue: Secondary | ICD-10-CM | POA: Insufficient documentation

## 2015-12-05 DIAGNOSIS — F17210 Nicotine dependence, cigarettes, uncomplicated: Secondary | ICD-10-CM | POA: Insufficient documentation

## 2015-12-05 DIAGNOSIS — Z9049 Acquired absence of other specified parts of digestive tract: Secondary | ICD-10-CM | POA: Insufficient documentation

## 2015-12-05 DIAGNOSIS — R112 Nausea with vomiting, unspecified: Secondary | ICD-10-CM | POA: Insufficient documentation

## 2015-12-05 DIAGNOSIS — Z87828 Personal history of other (healed) physical injury and trauma: Secondary | ICD-10-CM | POA: Insufficient documentation

## 2015-12-05 DIAGNOSIS — R109 Unspecified abdominal pain: Secondary | ICD-10-CM | POA: Insufficient documentation

## 2015-12-05 DIAGNOSIS — N189 Chronic kidney disease, unspecified: Secondary | ICD-10-CM | POA: Insufficient documentation

## 2015-12-05 DIAGNOSIS — Z792 Long term (current) use of antibiotics: Secondary | ICD-10-CM | POA: Insufficient documentation

## 2015-12-05 NOTE — ED Notes (Signed)
Per EMS pt is c/o abd pain x 2 days  Pt states he has had nausea, vomiting, and diarrhea since yesterday  Pt states he feels like he may be dehydrated

## 2015-12-06 ENCOUNTER — Encounter (HOSPITAL_COMMUNITY): Payer: Self-pay | Admitting: Emergency Medicine

## 2015-12-06 LAB — COMPREHENSIVE METABOLIC PANEL
ALT: 23 U/L (ref 17–63)
AST: 33 U/L (ref 15–41)
Albumin: 5 g/dL (ref 3.5–5.0)
Alkaline Phosphatase: 84 U/L (ref 38–126)
Anion gap: 14 (ref 5–15)
BUN: 17 mg/dL (ref 6–20)
CO2: 25 mmol/L (ref 22–32)
Calcium: 10.2 mg/dL (ref 8.9–10.3)
Chloride: 99 mmol/L — ABNORMAL LOW (ref 101–111)
Creatinine, Ser: 1.18 mg/dL (ref 0.61–1.24)
GFR calc Af Amer: >60 mL/min (ref >60)
GFR calc non Af Amer: >60 mL/min (ref >60)
Glucose, Bld: 127 mg/dL — ABNORMAL HIGH (ref 65–99)
Potassium: 4.9 mmol/L (ref 3.5–5.1)
Sodium: 138 mmol/L (ref 135–145)
Total Bilirubin: 0.8 mg/dL (ref 0.3–1.2)
Total Protein: 9.3 g/dL — ABNORMAL HIGH (ref 6.5–8.1)

## 2015-12-06 LAB — CBC
HCT: 48.2 % (ref 39.0–52.0)
Hemoglobin: 16.3 g/dL (ref 13.0–17.0)
MCH: 31.2 pg (ref 26.0–34.0)
MCHC: 33.8 g/dL (ref 30.0–36.0)
MCV: 92.3 fL (ref 78.0–100.0)
Platelets: 259 10*3/uL (ref 150–400)
RBC: 5.22 MIL/uL (ref 4.22–5.81)
RDW: 13.4 % (ref 11.5–15.5)
WBC: 10.6 10*3/uL — ABNORMAL HIGH (ref 4.0–10.5)

## 2015-12-06 LAB — LIPASE, BLOOD: Lipase: 35 U/L (ref 11–51)

## 2015-12-06 MED ORDER — DICYCLOMINE HCL 10 MG/ML IM SOLN
20.0000 mg | Freq: Once | INTRAMUSCULAR | Status: AC
  Administered 2015-12-06: 20 mg via INTRAMUSCULAR
  Filled 2015-12-06: qty 2

## 2015-12-06 MED ORDER — HALOPERIDOL LACTATE 5 MG/ML IJ SOLN: 5.0000 mg | Freq: Once | INTRAMUSCULAR | Status: DC

## 2015-12-06 MED ORDER — HALOPERIDOL LACTATE 5 MG/ML IJ SOLN
5.0000 mg | Freq: Once | INTRAMUSCULAR | Status: AC
  Administered 2015-12-06: 5 mg via INTRAVENOUS

## 2015-12-06 MED ORDER — ONDANSETRON HCL 4 MG/2ML IJ SOLN: 4.0000 mg | Freq: Once | INTRAMUSCULAR | Status: DC | PRN

## 2015-12-06 MED ORDER — ONDANSETRON 8 MG PO TBDP: ORAL_TABLET | ORAL | Status: DC

## 2015-12-06 MED ORDER — SODIUM CHLORIDE 0.9 % IV BOLUS (SEPSIS)
1000.0000 mL | Freq: Once | INTRAVENOUS | Status: AC
  Administered 2015-12-06: 1000 mL via INTRAVENOUS

## 2015-12-06 MED ORDER — HALOPERIDOL LACTATE 5 MG/ML IJ SOLN
2.0000 mg | Freq: Once | INTRAMUSCULAR | Status: DC
  Filled 2015-12-06: qty 1
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
## 2015-12-06 NOTE — ED Notes (Signed)
MD at bedside. 

## 2015-12-06 NOTE — ED Notes (Signed)
Pt moaning and calling out  Pt laying in triage floor  Readjusted pt chair to stretcher position and assisted him to back into chair  Informed pt that he will be next to go back  Pt repeatedly stating "Oh God, Oh God it hurts, it hurts"

## 2015-12-08 ENCOUNTER — Emergency Department (HOSPITAL_COMMUNITY)
Admission: EM | Admit: 2015-12-08 | Discharge: 2015-12-08 | Disposition: A | Discharge status: Home / Self Care | Payer: No Typology Code available for payment source | Attending: Emergency Medicine | Admitting: Emergency Medicine

## 2015-12-08 ENCOUNTER — Encounter (HOSPITAL_COMMUNITY): Payer: Self-pay | Admitting: *Deleted

## 2015-12-08 DIAGNOSIS — Z872 Personal history of diseases of the skin and subcutaneous tissue: Secondary | ICD-10-CM | POA: Insufficient documentation

## 2015-12-08 DIAGNOSIS — F17210 Nicotine dependence, cigarettes, uncomplicated: Secondary | ICD-10-CM | POA: Insufficient documentation

## 2015-12-08 DIAGNOSIS — Z79899 Other long term (current) drug therapy: Secondary | ICD-10-CM | POA: Insufficient documentation

## 2015-12-08 DIAGNOSIS — R109 Unspecified abdominal pain: Secondary | ICD-10-CM

## 2015-12-08 DIAGNOSIS — N189 Chronic kidney disease, unspecified: Secondary | ICD-10-CM | POA: Insufficient documentation

## 2015-12-08 DIAGNOSIS — Z87828 Personal history of other (healed) physical injury and trauma: Secondary | ICD-10-CM | POA: Insufficient documentation

## 2015-12-08 DIAGNOSIS — G8929 Other chronic pain: Secondary | ICD-10-CM | POA: Insufficient documentation

## 2015-12-08 DIAGNOSIS — R1084 Generalized abdominal pain: Secondary | ICD-10-CM | POA: Insufficient documentation

## 2015-12-08 LAB — COMPREHENSIVE METABOLIC PANEL
ALT: 16 U/L — ABNORMAL LOW (ref 17–63)
AST: 32 U/L (ref 15–41)
Albumin: 4 g/dL (ref 3.5–5.0)
Alkaline Phosphatase: 74 U/L (ref 38–126)
Anion gap: 10 (ref 5–15)
BUN: 11 mg/dL (ref 6–20)
CO2: 30 mmol/L (ref 22–32)
Calcium: 9.7 mg/dL (ref 8.9–10.3)
Chloride: 99 mmol/L — ABNORMAL LOW (ref 101–111)
Creatinine, Ser: 0.96 mg/dL (ref 0.61–1.24)
GFR calc Af Amer: >60 mL/min (ref >60)
GFR calc non Af Amer: >60 mL/min (ref >60)
Glucose, Bld: 116 mg/dL — ABNORMAL HIGH (ref 65–99)
Potassium: 3.8 mmol/L (ref 3.5–5.1)
Sodium: 139 mmol/L (ref 135–145)
Total Bilirubin: 0.8 mg/dL (ref 0.3–1.2)
Total Protein: 7.7 g/dL (ref 6.5–8.1)

## 2015-12-08 LAB — CBC
HCT: 42.2 % (ref 39.0–52.0)
Hemoglobin: 14.4 g/dL (ref 13.0–17.0)
MCH: 30.8 pg (ref 26.0–34.0)
MCHC: 34.1 g/dL (ref 30.0–36.0)
MCV: 90.4 fL (ref 78.0–100.0)
Platelets: 240 10*3/uL (ref 150–400)
RBC: 4.67 MIL/uL (ref 4.22–5.81)
RDW: 13.2 % (ref 11.5–15.5)
WBC: 9 10*3/uL (ref 4.0–10.5)

## 2015-12-08 LAB — URINE MICROSCOPIC-ADD ON

## 2015-12-08 LAB — URINALYSIS, ROUTINE W REFLEX MICROSCOPIC
Bilirubin Urine: NEGATIVE
Glucose, UA: NEGATIVE mg/dL
Hgb urine dipstick: NEGATIVE
Ketones, ur: 15 mg/dL — AB
Leukocytes, UA: NEGATIVE
Nitrite: NEGATIVE
Protein, ur: 30 mg/dL — AB
Specific Gravity, Urine: 1.04 — ABNORMAL HIGH (ref 1.005–1.030)
pH: 5.5 (ref 5.0–8.0)

## 2015-12-08 LAB — LIPASE, BLOOD: Lipase: 29 U/L (ref 11–51)
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
## 2015-12-08 NOTE — ED Notes (Signed)
Patient presents via [facility] with c/o abd pain.  Moaning out loud and restless in the chair.   Was seen at [facility] 1/2 for the same and given  Prescription for Zofran which was not filled.

## 2015-12-08 NOTE — Discharge Instructions (Signed)

## 2015-12-16 ENCOUNTER — Telehealth: Payer: Self-pay | Admitting: Internal Medicine

## 2015-12-16 ENCOUNTER — Other Ambulatory Visit: Payer: Self-pay | Admitting: *Deleted

## 2015-12-16 DIAGNOSIS — M62838 Other muscle spasm: Secondary | ICD-10-CM

## 2015-12-16 DIAGNOSIS — G8929 Other chronic pain: Secondary | ICD-10-CM

## 2015-12-16 DIAGNOSIS — R1031 Right lower quadrant pain: Secondary | ICD-10-CM

## 2015-12-16 NOTE — Telephone Encounter (Signed)
Patient came in requesting a medication refill for Zofran, omeprazole, tramadol, flexeril and stool softner. Patient states that he needed and increase in the Zofran medicine, patient states that he only receives four pills. Please follow up with patient.

## 2015-12-16 NOTE — Telephone Encounter (Signed)
Patient need medication refills on tramadol, flexeril, and would like a stool softner.Marland KitchenMarland KitchenMarland KitchenMarland Kitchenpatient stated he would come in to pick up today.Marland KitchenMarland KitchenMarland KitchenMarland Kitchenplease follow up

## 2015-12-17 ENCOUNTER — Encounter (HOSPITAL_COMMUNITY): Payer: Self-pay | Admitting: Emergency Medicine

## 2015-12-17 ENCOUNTER — Other Ambulatory Visit: Payer: No Typology Code available for payment source

## 2015-12-17 DIAGNOSIS — G8929 Other chronic pain: Secondary | ICD-10-CM | POA: Insufficient documentation

## 2015-12-17 DIAGNOSIS — R1031 Right lower quadrant pain: Secondary | ICD-10-CM | POA: Insufficient documentation

## 2015-12-17 DIAGNOSIS — F17210 Nicotine dependence, cigarettes, uncomplicated: Secondary | ICD-10-CM | POA: Insufficient documentation

## 2015-12-17 LAB — COMPREHENSIVE METABOLIC PANEL
ALT: 25 U/L (ref 17–63)
AST: 25 U/L (ref 15–41)
Albumin: 4.1 g/dL (ref 3.5–5.0)
Alkaline Phosphatase: 67 U/L (ref 38–126)
Anion gap: 12 (ref 5–15)
BUN: 7 mg/dL (ref 6–20)
CO2: 26 mmol/L (ref 22–32)
Calcium: 9.8 mg/dL (ref 8.9–10.3)
Chloride: 103 mmol/L (ref 101–111)
Creatinine, Ser: 1.14 mg/dL (ref 0.61–1.24)
GFR calc Af Amer: >60 mL/min (ref >60)
GFR calc non Af Amer: >60 mL/min (ref >60)
Glucose, Bld: 98 mg/dL (ref 65–99)
Potassium: 3.9 mmol/L (ref 3.5–5.1)
Sodium: 141 mmol/L (ref 135–145)
Total Bilirubin: 0.8 mg/dL (ref 0.3–1.2)
Total Protein: 7.2 g/dL (ref 6.5–8.1)

## 2015-12-17 LAB — URINALYSIS, ROUTINE W REFLEX MICROSCOPIC
Glucose, UA: NEGATIVE mg/dL
Ketones, ur: NEGATIVE mg/dL
Leukocytes, UA: NEGATIVE
Nitrite: NEGATIVE
Protein, ur: NEGATIVE mg/dL
Specific Gravity, Urine: 1.044 — ABNORMAL HIGH (ref 1.005–1.030)
pH: 5 (ref 5.0–8.0)

## 2015-12-17 LAB — CBC
HCT: 42.4 % (ref 39.0–52.0)
Hemoglobin: 14.6 g/dL (ref 13.0–17.0)
MCH: 31.5 pg (ref 26.0–34.0)
MCHC: 34.4 g/dL (ref 30.0–36.0)
MCV: 91.6 fL (ref 78.0–100.0)
Platelets: 219 10*3/uL (ref 150–400)
RBC: 4.63 MIL/uL (ref 4.22–5.81)
RDW: 13.6 % (ref 11.5–15.5)
WBC: 9.1 10*3/uL (ref 4.0–10.5)

## 2015-12-17 LAB — LIPASE, BLOOD: Lipase: 81 U/L — ABNORMAL HIGH (ref 11–51)

## 2015-12-17 LAB — URINE MICROSCOPIC-ADD ON

## 2015-12-17 MED ORDER — CYCLOBENZAPRINE HCL 10 MG PO TABS: 10.0000 mg | ORAL_TABLET | Freq: Three times a day (TID) | ORAL | Status: DC | PRN

## 2015-12-17 MED ORDER — FENTANYL CITRATE (PF) 100 MCG/2ML IJ SOLN
INTRAMUSCULAR | Status: AC
Start: 2015-12-17 — End: 2015-12-18
  Filled 2015-12-17: qty 2

## 2015-12-17 MED ORDER — FENTANYL CITRATE (PF) 100 MCG/2ML IJ SOLN
50.0000 ug | Freq: Once | INTRAMUSCULAR | Status: AC
  Administered 2015-12-17: 50 ug via NASAL

## 2015-12-17 MED ORDER — FENTANYL CITRATE (PF) 100 MCG/2ML IJ SOLN
INTRAMUSCULAR | Status: AC
  Filled 2015-12-17: qty 2

## 2015-12-17 MED ORDER — ONDANSETRON HCL 4 MG/2ML IJ SOLN
INTRAMUSCULAR | Status: AC
  Filled 2015-12-17: qty 2

## 2015-12-17 MED ORDER — TRAMADOL HCL 50 MG PO TABS: 50.0000 mg | ORAL_TABLET | Freq: Two times a day (BID) | ORAL | Status: DC | PRN

## 2015-12-17 MED FILL — ONDANSETRON ODT 4 MG TABLET: 4 | 8 days supply | Qty: 20 | Fill #0

## 2015-12-17 MED FILL — ?OMEPRAZOLE DR 20 MG CAPSUL: 20 | 30 days supply | Qty: 30 | Fill #1

## 2015-12-17 MED FILL — ?CYCLOBENZAPRINE 10 MG TABL: 10 | 20 days supply | Qty: 60 | Fill #0
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
## 2015-12-18 ENCOUNTER — Emergency Department (HOSPITAL_COMMUNITY)
Admission: EM | Admit: 2015-12-18 | Discharge: 2015-12-18 | Discharge status: Against Medical Advice | Payer: No Typology Code available for payment source | Attending: Emergency Medicine | Admitting: Emergency Medicine
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
## 2015-12-18 NOTE — ED Notes (Signed)
Pt called for room no answer

## 2015-12-18 NOTE — ED Notes (Signed)
Called Pt again No Answer. Moving Pt to OTF

## 2015-12-19 ENCOUNTER — Emergency Department (HOSPITAL_COMMUNITY)
Admission: EM | Admit: 2015-12-19 | Discharge: 2015-12-19 | Disposition: A | Discharge status: Home / Self Care | Payer: Self-pay | Attending: Emergency Medicine | Admitting: Emergency Medicine

## 2015-12-19 ENCOUNTER — Encounter (HOSPITAL_COMMUNITY): Payer: Self-pay | Admitting: *Deleted

## 2015-12-19 ENCOUNTER — Emergency Department (HOSPITAL_COMMUNITY): Payer: No Typology Code available for payment source

## 2015-12-19 DIAGNOSIS — R112 Nausea with vomiting, unspecified: Secondary | ICD-10-CM | POA: Insufficient documentation

## 2015-12-19 DIAGNOSIS — R1011 Right upper quadrant pain: Secondary | ICD-10-CM | POA: Insufficient documentation

## 2015-12-19 DIAGNOSIS — N189 Chronic kidney disease, unspecified: Secondary | ICD-10-CM | POA: Insufficient documentation

## 2015-12-19 DIAGNOSIS — Z87442 Personal history of urinary calculi: Secondary | ICD-10-CM | POA: Insufficient documentation

## 2015-12-19 DIAGNOSIS — Z87828 Personal history of other (healed) physical injury and trauma: Secondary | ICD-10-CM | POA: Insufficient documentation

## 2015-12-19 DIAGNOSIS — R109 Unspecified abdominal pain: Secondary | ICD-10-CM

## 2015-12-19 DIAGNOSIS — G8929 Other chronic pain: Secondary | ICD-10-CM | POA: Insufficient documentation

## 2015-12-19 DIAGNOSIS — F17210 Nicotine dependence, cigarettes, uncomplicated: Secondary | ICD-10-CM | POA: Insufficient documentation

## 2015-12-19 DIAGNOSIS — R1031 Right lower quadrant pain: Secondary | ICD-10-CM | POA: Insufficient documentation

## 2015-12-19 DIAGNOSIS — Z79899 Other long term (current) drug therapy: Secondary | ICD-10-CM | POA: Insufficient documentation

## 2015-12-19 DIAGNOSIS — Z872 Personal history of diseases of the skin and subcutaneous tissue: Secondary | ICD-10-CM | POA: Insufficient documentation

## 2015-12-19 LAB — URINALYSIS, ROUTINE W REFLEX MICROSCOPIC
Glucose, UA: NEGATIVE mg/dL
Hgb urine dipstick: NEGATIVE
Ketones, ur: 15 mg/dL — AB
Nitrite: NEGATIVE
Protein, ur: 30 mg/dL — AB
Specific Gravity, Urine: 1.038 — ABNORMAL HIGH (ref 1.005–1.030)
pH: 5.5 (ref 5.0–8.0)

## 2015-12-19 LAB — CBC
HCT: 41.9 % (ref 39.0–52.0)
Hemoglobin: 14.2 g/dL (ref 13.0–17.0)
MCH: 30.7 pg (ref 26.0–34.0)
MCHC: 33.9 g/dL (ref 30.0–36.0)
MCV: 90.5 fL (ref 78.0–100.0)
Platelets: 223 10*3/uL (ref 150–400)
RBC: 4.63 MIL/uL (ref 4.22–5.81)
RDW: 13.2 % (ref 11.5–15.5)
WBC: 7.1 10*3/uL (ref 4.0–10.5)

## 2015-12-19 LAB — URINE MICROSCOPIC-ADD ON
RBC / HPF: NONE SEEN RBC/hpf (ref 0–5)
Squamous Epithelial / LPF: NONE SEEN
WBC, UA: NONE SEEN WBC/hpf (ref 0–5)

## 2015-12-19 LAB — COMPREHENSIVE METABOLIC PANEL
ALT: 23 U/L (ref 17–63)
AST: 26 U/L (ref 15–41)
Albumin: 4 g/dL (ref 3.5–5.0)
Alkaline Phosphatase: 66 U/L (ref 38–126)
Anion gap: 10 (ref 5–15)
BUN: 8 mg/dL (ref 6–20)
CO2: 27 mmol/L (ref 22–32)
Calcium: 9.4 mg/dL (ref 8.9–10.3)
Chloride: 102 mmol/L (ref 101–111)
Creatinine, Ser: 1.03 mg/dL (ref 0.61–1.24)
GFR calc Af Amer: >60 mL/min (ref >60)
GFR calc non Af Amer: >60 mL/min (ref >60)
Glucose, Bld: 99 mg/dL (ref 65–99)
Potassium: 4 mmol/L (ref 3.5–5.1)
Sodium: 139 mmol/L (ref 135–145)
Total Bilirubin: 0.7 mg/dL (ref 0.3–1.2)
Total Protein: 7.3 g/dL (ref 6.5–8.1)

## 2015-12-19 LAB — LIPASE, BLOOD: Lipase: 23 U/L (ref 11–51)

## 2015-12-19 MED ORDER — HYDROMORPHONE HCL 1 MG/ML IJ SOLN: 2.0000 mg | Freq: Once | INTRAMUSCULAR | Status: DC

## 2015-12-19 MED ORDER — HALOPERIDOL LACTATE 5 MG/ML IJ SOLN
5.0000 mg | Freq: Once | INTRAMUSCULAR | Status: AC
  Administered 2015-12-19: 5 mg via INTRAMUSCULAR
  Filled 2015-12-19: qty 1

## 2015-12-19 MED ORDER — METOCLOPRAMIDE HCL 10 MG PO TABS: 10.0000 mg | ORAL_TABLET | Freq: Three times a day (TID) | ORAL | Status: DC | PRN

## 2015-12-19 NOTE — ED Notes (Addendum)
Pt verbalized understanding of d/c instructions and follow-up care. No further questions/concerns, VSS, ambulatory w/ steady gait (refused wheelchair) 

## 2015-12-19 NOTE — Discharge Instructions (Signed)
I would advise you to discontinue any marijuana abuse as this may be the cause of your pain and nausea Abdominal (belly) pain can be caused by many things. Your caregiver performed an examination and possibly ordered blood/urine tests and imaging (CT scan, x-rays, ultrasound). Many cases can be observed and treated at home after initial evaluation in the emergency department. Even though you are being discharged home, abdominal pain can be unpredictable. Therefore, you need a repeated exam if your pain does not resolve, returns, or worsens. Most patients with abdominal pain don't have to be admitted to the hospital or have surgery, but serious problems like appendicitis and gallbladder attacks can start out as nonspecific pain. Many abdominal conditions cannot be diagnosed in one visit, so follow-up evaluations are very important. SEEK IMMEDIATE MEDICAL ATTENTION IF: The pain does not go away or becomes severe.  A temperature above 101 develops.  Repeated vomiting occurs (multiple episodes).  The pain becomes localized to portions of the abdomen. The right side could possibly be appendicitis. In an adult, the left lower portion of the abdomen could be colitis or diverticulitis.  Blood is being passed in stools or vomit (bright red or black tarry stools).  Return also if you develop chest pain, difficulty breathing, dizziness or fainting, or become confused, poorly responsive, or inconsolable (young children).  Abdominal Pain, Adult Many things can cause abdominal pain. Usually, abdominal pain is not caused by a disease and will improve without treatment. It can often be observed and treated at home. Your health care provider will do a physical exam and possibly order blood tests and X-rays to help determine the seriousness of your pain. However, in many cases, more time must pass before a clear cause of the pain can be found. Before that point, your health care provider may not know if you need more  testing or further treatment. HOME CARE INSTRUCTIONS Monitor your abdominal pain for any changes. The following actions may help to alleviate any discomfort you are experiencing:  Only take over-the-counter or prescription medicines as directed by your health care provider.  Do not take laxatives unless directed to do so by your health care provider.  Try a clear liquid diet (broth, tea, or water) as directed by your health care provider. Slowly move to a bland diet as tolerated. SEEK MEDICAL CARE IF:  You have unexplained abdominal pain.  You have abdominal pain associated with nausea or diarrhea.  You have pain when you urinate or have a bowel movement.  You experience abdominal pain that wakes you in the night.  You have abdominal pain that is worsened or improved by eating food.  You have abdominal pain that is worsened with eating fatty foods.  You have a fever. SEEK IMMEDIATE MEDICAL CARE IF:  Your pain does not go away within 2 hours.  You keep throwing up (vomiting).  Your pain is felt only in portions of the abdomen, such as the right side or the left lower portion of the abdomen.  You pass bloody or black tarry stools. MAKE SURE YOU:  Understand these instructions.  Will watch your condition.  Will get help right away if you are not doing well or get worse.   This information is not intended to replace advice given to you by your health care provider. Make sure you discuss any questions you have with your health care provider.   Document Released: 08/30/2005 Document Revised: 08/11/2015 Document Reviewed: 07/30/2013 Elsevier Interactive Patient Education [employer].

## 2015-12-19 NOTE — ED Notes (Signed)
2 unsuccessful attempts at lab draws/IV access by this RN

## 2015-12-19 NOTE — ED Notes (Signed)
Attempted to draw pts labs was unsuccessful  
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
## 2015-12-19 NOTE — ED Notes (Signed)
Phleb at bedside to draw labs. 

## 2015-12-20 ENCOUNTER — Emergency Department (HOSPITAL_COMMUNITY): Payer: No Typology Code available for payment source

## 2015-12-20 ENCOUNTER — Encounter (HOSPITAL_COMMUNITY): Payer: Self-pay | Admitting: Emergency Medicine

## 2015-12-20 ENCOUNTER — Emergency Department (HOSPITAL_COMMUNITY)
Admission: EM | Admit: 2015-12-20 | Discharge: 2015-12-20 | Disposition: A | Discharge status: Home / Self Care | Payer: No Typology Code available for payment source | Attending: Emergency Medicine | Admitting: Emergency Medicine

## 2015-12-20 ENCOUNTER — Telehealth: Payer: Self-pay

## 2015-12-20 DIAGNOSIS — Z9049 Acquired absence of other specified parts of digestive tract: Secondary | ICD-10-CM | POA: Insufficient documentation

## 2015-12-20 DIAGNOSIS — N189 Chronic kidney disease, unspecified: Secondary | ICD-10-CM | POA: Insufficient documentation

## 2015-12-20 DIAGNOSIS — Z9889 Other specified postprocedural states: Secondary | ICD-10-CM | POA: Insufficient documentation

## 2015-12-20 DIAGNOSIS — Z87828 Personal history of other (healed) physical injury and trauma: Secondary | ICD-10-CM | POA: Insufficient documentation

## 2015-12-20 DIAGNOSIS — F17210 Nicotine dependence, cigarettes, uncomplicated: Secondary | ICD-10-CM | POA: Insufficient documentation

## 2015-12-20 DIAGNOSIS — R110 Nausea: Secondary | ICD-10-CM | POA: Insufficient documentation

## 2015-12-20 DIAGNOSIS — Z8719 Personal history of other diseases of the digestive system: Secondary | ICD-10-CM | POA: Insufficient documentation

## 2015-12-20 DIAGNOSIS — R1031 Right lower quadrant pain: Secondary | ICD-10-CM

## 2015-12-20 DIAGNOSIS — Z79899 Other long term (current) drug therapy: Secondary | ICD-10-CM | POA: Insufficient documentation

## 2015-12-20 DIAGNOSIS — Z872 Personal history of diseases of the skin and subcutaneous tissue: Secondary | ICD-10-CM | POA: Insufficient documentation

## 2015-12-20 DIAGNOSIS — N5082 Scrotal pain: Secondary | ICD-10-CM | POA: Insufficient documentation

## 2015-12-20 DIAGNOSIS — R39198 Other difficulties with micturition: Secondary | ICD-10-CM | POA: Insufficient documentation

## 2015-12-20 DIAGNOSIS — M545 Low back pain: Secondary | ICD-10-CM | POA: Insufficient documentation

## 2015-12-20 DIAGNOSIS — G8929 Other chronic pain: Secondary | ICD-10-CM | POA: Insufficient documentation

## 2015-12-20 DIAGNOSIS — R34 Anuria and oliguria: Secondary | ICD-10-CM | POA: Insufficient documentation

## 2015-12-20 DIAGNOSIS — N50811 Right testicular pain: Secondary | ICD-10-CM

## 2015-12-20 DIAGNOSIS — Z87442 Personal history of urinary calculi: Secondary | ICD-10-CM | POA: Insufficient documentation

## 2015-12-20 LAB — URINALYSIS, ROUTINE W REFLEX MICROSCOPIC
Glucose, UA: NEGATIVE mg/dL
Hgb urine dipstick: NEGATIVE
Ketones, ur: NEGATIVE mg/dL
Leukocytes, UA: NEGATIVE
Nitrite: NEGATIVE
Protein, ur: NEGATIVE mg/dL
Specific Gravity, Urine: 1.039 — ABNORMAL HIGH (ref 1.005–1.030)
pH: 6 (ref 5.0–8.0)

## 2015-12-20 MED ORDER — SODIUM CHLORIDE 0.9 % IV BOLUS (SEPSIS)
1000.0000 mL | Freq: Once | INTRAVENOUS | Status: AC
  Administered 2015-12-20: 1000 mL via INTRAVENOUS

## 2015-12-20 MED ORDER — ONDANSETRON HCL 4 MG/2ML IJ SOLN
4.0000 mg | Freq: Once | INTRAMUSCULAR | Status: AC
  Administered 2015-12-20: 4 mg via INTRAVENOUS
  Filled 2015-12-20: qty 2

## 2015-12-20 MED ORDER — DICYCLOMINE HCL 10 MG PO CAPS
20.0000 mg | ORAL_CAPSULE | Freq: Once | ORAL | Status: AC
  Administered 2015-12-20: 20 mg via ORAL
  Filled 2015-12-20: qty 2

## 2015-12-20 MED ORDER — KETOROLAC TROMETHAMINE 30 MG/ML IJ SOLN
30.0000 mg | Freq: Once | INTRAMUSCULAR | Status: AC
  Administered 2015-12-20: 30 mg via INTRAVENOUS
  Filled 2015-12-20: qty 1

## 2015-12-20 MED FILL — traMADol HCL 50 MG TABS: 50 | 45 days supply | Qty: 90 | Fill #0

## 2015-12-20 NOTE — Progress Notes (Signed)
Entered in d/c instructions [name] Go on 01/06/2016 You have an appt scheduled at 4:15 pm on 01/06/16 with Dr Doreene Burke [place] [id number] [phone]

## 2015-12-20 NOTE — ED Notes (Signed)
Blood not needed per MD

## 2015-12-20 NOTE — ED Notes (Signed)
Pt complaint of acute chronic right abdominal pain; evaluated for same Friday.

## 2015-12-20 NOTE — ED Notes (Signed)
Unable to obtain IV access, delay in administering meds.

## 2015-12-20 NOTE — Discharge Instructions (Signed)
You have been seen today for abdominal pain. Your imaging and lab tests showed no acute abnormalities. Keep your coming appointment with your PCP. Return to ED should symptoms worsen. There were 2 scrotal cysts found in your scrotum that are probably benign. Be sure to have a follow-up scrotal ultrasound in 3-6 months to reevaluate the scrotal cysts.

## 2015-12-20 NOTE — Progress Notes (Signed)
Pt visited pt to review his upcoming [place] appt  CM spoke with pt who confirms uninsured [employer] resident with no pcp.  CM discussed and provided written information for uninsured accepting pcps, discussed the importance of pcp vs EDP services for f/u care, www.needymeds.org, www.goodrx.com, discounted pharmacies and other [employer] such as [employer] [facility] [employer], affordable care act, financial assistance, uninsured dental services, [place] med assist, DSS and  health department  Reviewed resources for [employer] uninsured accepting pcps like Paediatric nurse, family medicine at [address], [facility], [facility], local urgent care centers, [facility], [facility] family practice, general medical clinics, family services of the [place], [facility] urgent care plus others, medication resources, [facility] out patient pharmacies and housing Pt voiced understanding and appreciation of resources provided   Provided [facility] contact information Pt states he has spoken with a staff member at the [facility] ED about an orange card states he has completed the application and has attempted to call this person without success Pt insists this is a Cm but CM discussed with him the person he saw was a staff member of [facility] and he should be able to contact this person  Pt states he is homeless Pt is familiar with [employer] Pt states he first got an orange card from [facility] in the past  Pt noted to be flinging himself over and over in the bed. Cm used the teach back method to see if pt understand what Cm review Pt bearly able to repeat most of information review.
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
## 2015-12-20 NOTE — ED Notes (Signed)
Attempted twice to collect labs unable to. Nurse have been info

## 2015-12-20 NOTE — Telephone Encounter (Signed)
Message received from Joellyn Quails, RN CM requesting a follow up appointment for the patient.  An appointment was scheduled with Dr Doreene Burke on 01/06/16 @ 1615 at the [facility].  Update provided to Joellyn Quails, RN CM who placed the information on the AVS.

## 2015-12-20 NOTE — ED Notes (Signed)
Spoke to MD, can not obtain blood.  Several attempts has been made.

## 2016-01-06 ENCOUNTER — Encounter: Payer: Self-pay | Admitting: Internal Medicine

## 2016-01-06 ENCOUNTER — Ambulatory Visit: Payer: No Typology Code available for payment source | Attending: Internal Medicine | Admitting: Internal Medicine

## 2016-01-06 VITALS — BP 116/73 | HR 75 | Temp 98.6°F | Resp 18 | Ht 70.5 in | Wt 152.2 lb

## 2016-01-06 DIAGNOSIS — K859 Acute pancreatitis without necrosis or infection, unspecified: Secondary | ICD-10-CM | POA: Insufficient documentation

## 2016-01-06 DIAGNOSIS — N189 Chronic kidney disease, unspecified: Secondary | ICD-10-CM | POA: Insufficient documentation

## 2016-01-06 DIAGNOSIS — R109 Unspecified abdominal pain: Secondary | ICD-10-CM | POA: Insufficient documentation

## 2016-01-06 DIAGNOSIS — L729 Follicular cyst of the skin and subcutaneous tissue, unspecified: Secondary | ICD-10-CM | POA: Insufficient documentation

## 2016-01-06 DIAGNOSIS — Z888 Allergy status to other drugs, medicaments and biological substances status: Secondary | ICD-10-CM | POA: Insufficient documentation

## 2016-01-06 DIAGNOSIS — G8929 Other chronic pain: Secondary | ICD-10-CM | POA: Insufficient documentation

## 2016-01-06 DIAGNOSIS — R1031 Right lower quadrant pain: Secondary | ICD-10-CM

## 2016-01-06 DIAGNOSIS — Z79899 Other long term (current) drug therapy: Secondary | ICD-10-CM | POA: Insufficient documentation

## 2016-01-06 DIAGNOSIS — M7989 Other specified soft tissue disorders: Secondary | ICD-10-CM | POA: Insufficient documentation

## 2016-01-06 NOTE — Patient Instructions (Signed)

## 2016-01-06 NOTE — Progress Notes (Signed)
Patient here for ED FU  Patient denies any pain or abdominal discomfort.  Patient denies any dysuria being present at this time.Patient did state symptom comes and goes.  Patient declined the flu shot today.
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
## 2016-01-27 ENCOUNTER — Telehealth: Payer: Self-pay | Admitting: Internal Medicine

## 2016-01-27 NOTE — Telephone Encounter (Signed)
Patient called requesting a medication refill for tramadol, flexeril, omeprozale and zofran. Please follow up.

## 2016-01-28 ENCOUNTER — Other Ambulatory Visit: Payer: Self-pay | Admitting: *Deleted

## 2016-01-28 DIAGNOSIS — R1031 Right lower quadrant pain: Secondary | ICD-10-CM

## 2016-01-28 DIAGNOSIS — G8929 Other chronic pain: Secondary | ICD-10-CM

## 2016-01-28 DIAGNOSIS — K2100 Gastro-esophageal reflux disease with esophagitis, without bleeding: Secondary | ICD-10-CM

## 2016-01-28 MED ORDER — OMEPRAZOLE 20 MG PO CPDR: 20.0000 mg | DELAYED_RELEASE_CAPSULE | Freq: Every day | ORAL | Status: DC

## 2016-01-28 MED ORDER — ONDANSETRON 8 MG PO TBDP: ORAL_TABLET | ORAL | Status: DC

## 2016-01-28 MED ORDER — TRAMADOL HCL 50 MG PO TABS: 50.0000 mg | ORAL_TABLET | Freq: Two times a day (BID) | ORAL | Status: DC | PRN

## 2016-01-28 MED FILL — ?OMEPRAZOLE DR 20 MG CAPSUL: 20 | 30 days supply | Qty: 30 | Fill #0

## 2016-01-30 ENCOUNTER — Encounter (HOSPITAL_COMMUNITY): Payer: Self-pay | Admitting: Emergency Medicine

## 2016-01-30 ENCOUNTER — Emergency Department (HOSPITAL_COMMUNITY)
Admission: EM | Admit: 2016-01-30 | Discharge: 2016-01-31 | Disposition: A | Discharge status: Home / Self Care | Payer: No Typology Code available for payment source | Attending: Emergency Medicine | Admitting: Emergency Medicine

## 2016-01-30 DIAGNOSIS — Z9889 Other specified postprocedural states: Secondary | ICD-10-CM | POA: Insufficient documentation

## 2016-01-30 DIAGNOSIS — Z79899 Other long term (current) drug therapy: Secondary | ICD-10-CM | POA: Insufficient documentation

## 2016-01-30 DIAGNOSIS — N189 Chronic kidney disease, unspecified: Secondary | ICD-10-CM | POA: Insufficient documentation

## 2016-01-30 DIAGNOSIS — R112 Nausea with vomiting, unspecified: Secondary | ICD-10-CM | POA: Insufficient documentation

## 2016-01-30 DIAGNOSIS — Z9049 Acquired absence of other specified parts of digestive tract: Secondary | ICD-10-CM | POA: Insufficient documentation

## 2016-01-30 DIAGNOSIS — Z8719 Personal history of other diseases of the digestive system: Secondary | ICD-10-CM | POA: Insufficient documentation

## 2016-01-30 DIAGNOSIS — Z872 Personal history of diseases of the skin and subcutaneous tissue: Secondary | ICD-10-CM | POA: Insufficient documentation

## 2016-01-30 DIAGNOSIS — Z87442 Personal history of urinary calculi: Secondary | ICD-10-CM | POA: Insufficient documentation

## 2016-01-30 DIAGNOSIS — Z87891 Personal history of nicotine dependence: Secondary | ICD-10-CM | POA: Insufficient documentation

## 2016-01-30 DIAGNOSIS — G8929 Other chronic pain: Secondary | ICD-10-CM

## 2016-01-30 DIAGNOSIS — R1031 Right lower quadrant pain: Secondary | ICD-10-CM | POA: Insufficient documentation

## 2016-01-30 DIAGNOSIS — Z87828 Personal history of other (healed) physical injury and trauma: Secondary | ICD-10-CM | POA: Insufficient documentation

## 2016-01-30 MED ORDER — DICYCLOMINE HCL 10 MG/ML IM SOLN
20.0000 mg | Freq: Once | INTRAMUSCULAR | Status: AC
Start: 2016-01-30 — End: 2016-01-31
  Administered 2016-01-31: 20 mg via INTRAMUSCULAR
  Filled 2016-01-30: qty 2

## 2016-01-30 MED ORDER — HALOPERIDOL LACTATE 5 MG/ML IJ SOLN
2.5000 mg | Freq: Once | INTRAMUSCULAR | Status: AC
  Administered 2016-01-31: 2.5 mg via INTRAVENOUS
  Filled 2016-01-30: qty 1

## 2016-01-30 MED ORDER — PANTOPRAZOLE SODIUM 40 MG IV SOLR
40.0000 mg | Freq: Once | INTRAVENOUS | Status: AC
  Administered 2016-01-31: 40 mg via INTRAVENOUS
  Filled 2016-01-30: qty 40

## 2016-01-30 MED ORDER — SODIUM CHLORIDE 0.9 % IV BOLUS (SEPSIS)
1000.0000 mL | Freq: Once | INTRAVENOUS | Status: AC
  Administered 2016-01-31: 1000 mL via INTRAVENOUS
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
## 2016-01-31 LAB — COMPREHENSIVE METABOLIC PANEL
ALT: 15 U/L — ABNORMAL LOW (ref 17–63)
AST: 26 U/L (ref 15–41)
Albumin: 4.4 g/dL (ref 3.5–5.0)
Alkaline Phosphatase: 65 U/L (ref 38–126)
Anion gap: 12 (ref 5–15)
BUN: 8 mg/dL (ref 6–20)
CO2: 24 mmol/L (ref 22–32)
Calcium: 10.4 mg/dL — ABNORMAL HIGH (ref 8.9–10.3)
Chloride: 106 mmol/L (ref 101–111)
Creatinine, Ser: 1.22 mg/dL (ref 0.61–1.24)
GFR calc Af Amer: >60 mL/min (ref >60)
GFR calc non Af Amer: >60 mL/min (ref >60)
Glucose, Bld: 129 mg/dL — ABNORMAL HIGH (ref 65–99)
Potassium: 3.9 mmol/L (ref 3.5–5.1)
Sodium: 142 mmol/L (ref 135–145)
Total Bilirubin: 0.9 mg/dL (ref 0.3–1.2)
Total Protein: 7.8 g/dL (ref 6.5–8.1)

## 2016-01-31 LAB — CBC WITH DIFFERENTIAL/PLATELET
Basophils Absolute: 0 10*3/uL (ref 0.0–0.1)
Basophils Relative: 0 %
Eosinophils Absolute: 0 10*3/uL (ref 0.0–0.7)
Eosinophils Relative: 0 %
HCT: 41 % (ref 39.0–52.0)
Hemoglobin: 14.5 g/dL (ref 13.0–17.0)
Lymphocytes Relative: 19 %
Lymphs Abs: 2.4 10*3/uL (ref 0.7–4.0)
MCH: 31.5 pg (ref 26.0–34.0)
MCHC: 35.4 g/dL (ref 30.0–36.0)
MCV: 89.1 fL (ref 78.0–100.0)
Monocytes Absolute: 1 10*3/uL (ref 0.1–1.0)
Monocytes Relative: 8 %
Neutro Abs: 9.1 10*3/uL — ABNORMAL HIGH (ref 1.7–7.7)
Neutrophils Relative %: 73 %
Platelets: 218 10*3/uL (ref 150–400)
RBC: 4.6 MIL/uL (ref 4.22–5.81)
RDW: 13.4 % (ref 11.5–15.5)
WBC: 12.6 10*3/uL — ABNORMAL HIGH (ref 4.0–10.5)

## 2016-01-31 LAB — LIPASE, BLOOD: Lipase: 34 U/L (ref 11–51)

## 2016-01-31 MED ORDER — DICYCLOMINE HCL 20 MG PO TABS: 20.0000 mg | ORAL_TABLET | Freq: Two times a day (BID) | ORAL | Status: DC

## 2016-01-31 NOTE — Discharge Instructions (Signed)

## 2016-02-01 MED FILL — ?CYCLOBENZAPRINE 10 MG TABL: 10 | 20 days supply | Qty: 60 | Fill #1

## 2016-02-01 MED FILL — ONDANSETRON ODT 4 MG TABLET: 4 | 13 days supply | Qty: 15 | Fill #0

## 2016-02-07 NOTE — Telephone Encounter (Signed)
Patient called requesting status of medication refill for Tramadol, please f/u . Patient states he will come in today

## 2016-02-07 NOTE — Telephone Encounter (Signed)
Patients medication was filled on 01/27/26. Prescription was placed at the front desk for pick a week ago.

## 2016-02-08 ENCOUNTER — Encounter (HOSPITAL_COMMUNITY): Payer: Self-pay | Admitting: Emergency Medicine

## 2016-02-08 DIAGNOSIS — G8929 Other chronic pain: Secondary | ICD-10-CM | POA: Insufficient documentation

## 2016-02-08 DIAGNOSIS — Z79899 Other long term (current) drug therapy: Secondary | ICD-10-CM | POA: Insufficient documentation

## 2016-02-08 DIAGNOSIS — Z87828 Personal history of other (healed) physical injury and trauma: Secondary | ICD-10-CM | POA: Insufficient documentation

## 2016-02-08 DIAGNOSIS — N179 Acute kidney failure, unspecified: Secondary | ICD-10-CM | POA: Insufficient documentation

## 2016-02-08 DIAGNOSIS — Z872 Personal history of diseases of the skin and subcutaneous tissue: Secondary | ICD-10-CM | POA: Insufficient documentation

## 2016-02-08 DIAGNOSIS — N189 Chronic kidney disease, unspecified: Secondary | ICD-10-CM | POA: Insufficient documentation

## 2016-02-08 DIAGNOSIS — Z8719 Personal history of other diseases of the digestive system: Secondary | ICD-10-CM | POA: Insufficient documentation

## 2016-02-08 DIAGNOSIS — Z87891 Personal history of nicotine dependence: Secondary | ICD-10-CM | POA: Insufficient documentation

## 2016-02-08 DIAGNOSIS — N390 Urinary tract infection, site not specified: Secondary | ICD-10-CM | POA: Insufficient documentation

## 2016-02-08 LAB — CBC
HCT: 48.3 % (ref 39.0–52.0)
Hemoglobin: 17.2 g/dL — ABNORMAL HIGH (ref 13.0–17.0)
MCH: 31.9 pg (ref 26.0–34.0)
MCHC: 35.6 g/dL (ref 30.0–36.0)
MCV: 89.4 fL (ref 78.0–100.0)
Platelets: 227 10*3/uL (ref 150–400)
RBC: 5.4 MIL/uL (ref 4.22–5.81)
RDW: 13.5 % (ref 11.5–15.5)
WBC: 10.6 10*3/uL — ABNORMAL HIGH (ref 4.0–10.5)

## 2016-02-08 LAB — URINALYSIS, ROUTINE W REFLEX MICROSCOPIC
Glucose, UA: NEGATIVE mg/dL
Hgb urine dipstick: NEGATIVE
Ketones, ur: 15 mg/dL — AB
Nitrite: POSITIVE — AB
Protein, ur: 100 mg/dL — AB
Specific Gravity, Urine: 1.038 — ABNORMAL HIGH (ref 1.005–1.030)
pH: 5 (ref 5.0–8.0)

## 2016-02-08 LAB — COMPREHENSIVE METABOLIC PANEL
ALT: 16 U/L — ABNORMAL LOW (ref 17–63)
AST: 25 U/L (ref 15–41)
Albumin: 4.6 g/dL (ref 3.5–5.0)
Alkaline Phosphatase: 77 U/L (ref 38–126)
Anion gap: 16 — ABNORMAL HIGH (ref 5–15)
BUN: 14 mg/dL (ref 6–20)
CO2: 21 mmol/L — ABNORMAL LOW (ref 22–32)
Calcium: 10.3 mg/dL (ref 8.9–10.3)
Chloride: 102 mmol/L (ref 101–111)
Creatinine, Ser: 1.69 mg/dL — ABNORMAL HIGH (ref 0.61–1.24)
GFR calc Af Amer: 52 mL/min — ABNORMAL LOW (ref >60)
GFR calc non Af Amer: 45 mL/min — ABNORMAL LOW (ref >60)
Glucose, Bld: 102 mg/dL — ABNORMAL HIGH (ref 65–99)
Potassium: 4.6 mmol/L (ref 3.5–5.1)
Sodium: 139 mmol/L (ref 135–145)
Total Bilirubin: 0.7 mg/dL (ref 0.3–1.2)
Total Protein: 8.6 g/dL — ABNORMAL HIGH (ref 6.5–8.1)

## 2016-02-08 LAB — URINE MICROSCOPIC-ADD ON

## 2016-02-08 LAB — LIPASE, BLOOD: Lipase: 22 U/L (ref 11–51)
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
## 2016-02-09 ENCOUNTER — Emergency Department (HOSPITAL_COMMUNITY)
Admission: EM | Admit: 2016-02-09 | Discharge: 2016-02-09 | Disposition: A | Discharge status: Home / Self Care | Payer: No Typology Code available for payment source | Attending: Emergency Medicine | Admitting: Emergency Medicine

## 2016-02-09 DIAGNOSIS — R109 Unspecified abdominal pain: Secondary | ICD-10-CM

## 2016-02-09 DIAGNOSIS — N390 Urinary tract infection, site not specified: Secondary | ICD-10-CM

## 2016-02-09 DIAGNOSIS — G8929 Other chronic pain: Secondary | ICD-10-CM

## 2016-02-09 DIAGNOSIS — N179 Acute kidney failure, unspecified: Secondary | ICD-10-CM

## 2016-02-09 MED ORDER — CEPHALEXIN 500 MG PO CAPS: 500.0000 mg | ORAL_CAPSULE | Freq: Four times a day (QID) | ORAL | Status: DC

## 2016-02-09 MED ORDER — HALOPERIDOL LACTATE 5 MG/ML IJ SOLN
2.0000 mg | Freq: Once | INTRAMUSCULAR | Status: AC
  Administered 2016-02-09: 2 mg via INTRAVENOUS

## 2016-02-09 MED ORDER — HALOPERIDOL LACTATE 5 MG/ML IJ SOLN
INTRAMUSCULAR | Status: AC
  Filled 2016-02-09: qty 1

## 2016-02-09 MED ORDER — DEXTROSE 5 % IV SOLN
INTRAVENOUS | Status: AC
  Filled 2016-02-09: qty 10

## 2016-02-09 MED ORDER — ONDANSETRON 4 MG PO TBDP: 4.0000 mg | ORAL_TABLET | Freq: Three times a day (TID) | ORAL | Status: DC | PRN

## 2016-02-09 MED ORDER — SODIUM CHLORIDE 0.9 % IV BOLUS (SEPSIS)
1000.0000 mL | Freq: Once | INTRAVENOUS | Status: AC
  Administered 2016-02-09: 1000 mL via INTRAVENOUS

## 2016-02-09 MED ORDER — DEXTROSE 5 % IV SOLN
1.0000 g | Freq: Once | INTRAVENOUS | Status: AC
  Administered 2016-02-09: 1 g via INTRAVENOUS

## 2016-02-09 NOTE — ED Notes (Signed)
MD at bedside. 

## 2016-02-09 NOTE — ED Notes (Signed)
Patient verbalized understanding of discharge instructions and denies any further needs or questions at this time. VS stable. Patient ambulatory with steady gait.  

## 2016-02-09 NOTE — ED Notes (Signed)
Patient writhing in bed, screaming constantly about needing something more for pain. Ordered medications given, MD made aware. No new orders at this time.
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
## 2016-02-09 NOTE — Discharge Instructions (Signed)
Chronic Pain  Chronic pain can be defined as pain that is off and on and lasts for 3-6 months or longer. Many things cause chronic pain, which can make it difficult to make a diagnosis. There are many treatment options available for chronic pain. However, finding a treatment that works well for you may require trying various approaches until the right one is found. Many people benefit from a combination of two or more types of treatment to control their pain.  SYMPTOMS   Chronic pain can occur anywhere in the body and can range from mild to very severe. Some types of chronic pain include:  · Headache.  · Low back pain.  · Cancer pain.  · Arthritis pain.  · Neurogenic pain. This is pain resulting from damage to nerves.   People with chronic pain may also have other symptoms such as:  · Depression.  · Anger.  · Insomnia.  · Anxiety.  DIAGNOSIS   Your health care provider will help diagnose your condition over time. In many cases, the initial focus will be on excluding possible conditions that could be causing the pain. Depending on your symptoms, your health care provider may order tests to diagnose your condition. Some of these tests may include:   · Blood tests.    · CT scan.    · MRI.    · X-rays.    · Ultrasounds.    · Nerve conduction studies.    You may need to see a specialist.   TREATMENT   Finding treatment that works well may take time. You may be referred to a pain specialist. He or she may prescribe medicine or therapies, such as:   · Mindful meditation or yoga.  · Shots (injections) of numbing or pain-relieving medicines into the spine or area of pain.  · Local electrical stimulation.  · Acupuncture.    · Massage therapy.    · Aroma, color, light, or sound therapy.    · Biofeedback.    · Working with a physical therapist to keep from getting stiff.    · Regular, gentle exercise.    · Cognitive or behavioral therapy.    · Group support.    Sometimes, surgery may be recommended.   HOME CARE INSTRUCTIONS    · Take all medicines as directed by your health care provider.    · Lessen stress in your life by relaxing and doing things such as listening to calming music.    · Exercise or be active as directed by your health care provider.    · Eat a healthy diet and include things such as vegetables, fruits, fish, and lean meats in your diet.    · Keep all follow-up appointments with your health care provider.    · Attend a support group with others suffering from chronic pain.  SEEK MEDICAL CARE IF:   · Your pain gets worse.    · You develop a new pain that was not there before.    · You cannot tolerate medicines given to you by your health care provider.    · You have new symptoms since your last visit with your health care provider.    SEEK IMMEDIATE MEDICAL CARE IF:   · You feel weak.    · You have decreased sensation or numbness.    · You lose control of bowel or bladder function.    · Your pain suddenly gets much worse.    · You develop shaking.  · You develop chills.  · You develop confusion.  · You develop chest pain.  · You develop shortness of breath.    MAKE SURE YOU:  ·   Document Revised: 07/23/2013 Document Reviewed: 05/16/2013 Elsevier Interactive Patient Education 2016 [employer]  Acute Kidney Injury Acute kidney injury is any condition in which there is sudden (acute) damage to the kidneys. Acute kidney injury was previously known as acute kidney failure or acute renal failure. The kidneys are two organs that lie on either side of the spine between the middle of the back and the front of the abdomen. The kidneys:  Remove wastes and extra water from the blood.    Produce important hormones. These help keep bones strong, regulate blood pressure, and help create red blood cells.   Balance the fluids and chemicals in the blood and tissues. A small amount of kidney damage may not cause problems, but a large amount of damage may make it difficult or impossible for the kidneys to work the way they should. Acute kidney injury may develop into long-lasting (chronic) kidney disease. It may also develop into a life-threatening disease called end-stage kidney disease. Acute kidney injury can get worse very quickly, so it should be treated right away. Early treatment may prevent other kidney diseases from developing. CAUSES   A problem with blood flow to the kidneys. This may be caused by:   Blood loss.   Heart disease.   Severe burns.   Liver disease.  Direct damage to the kidneys. This may be caused by:  Some medicines.   A kidney infection.   Poisoning or consuming toxic substances.   A surgical wound.   A blow to the kidney area.   A problem with urine flow. This may be caused by:   Cancer.   Kidney stones.   An enlarged prostate. SIGNS AND SYMPTOMS   Swelling (edema) of the legs, ankles, or feet.   Tiredness (lethargy).   Nausea or vomiting.   Confusion.   Problems with urination, such as:   Painful or burning feeling during urination.   Decreased urine production.   Frequent accidents in children who are potty trained.   Bloody urine.   Muscle twitches and cramps.   Shortness of breath.   Seizures.   Chest pain or pressure. Sometimes, no symptoms are present. DIAGNOSIS Acute kidney injury may be detected and diagnosed by tests, including blood, urine, imaging, or kidney biopsy tests.  TREATMENT Treatment of acute kidney injury varies depending on the cause and severity of the kidney damage. In mild cases, no treatment may be needed. The kidneys may heal on their own. If acute kidney  injury is more severe, your health care provider will treat the cause of the kidney damage, help the kidneys heal, and prevent complications from occurring. Severe cases may require a procedure to remove toxic wastes from the body (dialysis) or surgery to repair kidney damage. Surgery may involve:   Repair of a torn kidney.   Removal of an obstruction. HOME CARE INSTRUCTIONS  Follow your prescribed diet.  Take medicines only as directed by your health care provider.  Do not take any new medicines (prescription, over-the-counter, or nutritional supplements) unless approved by your health care provider. Many medicines can worsen your kidney damage or may need to have the dose adjusted.   Keep all follow-up visits as directed by your health care provider. This is important.  Observe your condition to make sure you are healing as expected. SEEK IMMEDIATE MEDICAL CARE IF:  You are feeling ill or have severe pain in the back or side.   Your symptoms return or you have new symptoms.  You  have any symptoms of end-stage kidney disease. These include:   Persistent itchiness.   Loss of appetite.   Headaches.   Abnormally dark or light skin.  Numbness in the hands or feet.   Easy bruising.   Frequent hiccups.   Menstruation stops.   You have a fever.  You have increased urine production.  You have pain or bleeding when urinating. MAKE SURE YOU:   Understand these instructions.  Will watch your condition.  Will get help right away if you are not doing well or get worse.   This information is not intended to replace advice given to you by your health care provider. Make sure you discuss any questions you have with your health care provider.   Document Released: 06/05/2011 Document Revised: 12/11/2014 Document Reviewed: 07/19/2012 Elsevier Interactive Patient Education 2016 Else[employer] Inc.  Urinary Tract Infection Urinary tract infections (UTIs) can develop  anywhere along your urinary tract. Your urinary tract is your body's drainage system for removing wastes and extra water. Your urinary tract includes two kidneys, two ureters, a bladder, and a urethra. Your kidneys are a pair of bean-shaped organs. Each kidney is about the size of your fist. They are located below your ribs, one on each side of your spine. CAUSES Infections are caused by microbes, which are microscopic organisms, including fungi, viruses, and bacteria. These organisms are so small that they can only be seen through a microscope. Bacteria are the microbes that most commonly cause UTIs. SYMPTOMS  Symptoms of UTIs may vary by age and gender of the patient and by the location of the infection. Symptoms in young women typically include a frequent and intense urge to urinate and a painful, burning feeling in the bladder or urethra during urination. Older women and men are more likely to be tired, shaky, and weak and have muscle aches and abdominal pain. A fever may mean the infection is in your kidneys. Other symptoms of a kidney infection include pain in your back or sides below the ribs, nausea, and vomiting. DIAGNOSIS To diagnose a UTI, your caregiver will ask you about your symptoms. Your caregiver will also ask you to provide a urine sample. The urine sample will be tested for bacteria and white blood cells. White blood cells are made by your body to help fight infection. TREATMENT  Typically, UTIs can be treated with medication. Because most UTIs are caused by a bacterial infection, they usually can be treated with the use of antibiotics. The choice of antibiotic and length of treatment depend on your symptoms and the type of bacteria causing your infection. HOME CARE INSTRUCTIONS  If you were prescribed antibiotics, take them exactly as your caregiver instructs you. Finish the medication even if you feel better after you have only taken some of the medication.  Drink enough water and  fluids to keep your urine clear or pale yellow.  Avoid caffeine, tea, and carbonated beverages. They tend to irritate your bladder.  Empty your bladder often. Avoid holding urine for long periods of time.  Empty your bladder before and after sexual intercourse.  After a bowel movement, women should cleanse from front to back. Use each tissue only once. SEEK MEDICAL CARE IF:   You have back pain.  You develop a fever.  Your symptoms do not begin to resolve within 3 days. SEEK IMMEDIATE MEDICAL CARE IF:   You have severe back pain or lower abdominal pain.  You develop chills.  You have nausea or vomiting.  You have continued burning or discomfort with urination. MAKE SURE YOU:   Understand these instructions.  Will watch your condition.  Will get help right away if you are not doing well or get worse.   This information is not intended to replace advice given to you by your health care provider. Make sure you discuss any questions you have with your health care provider.   Document Released: 08/30/2005 Document Revised: 08/11/2015 Document Reviewed: 12/29/2011 Elsevier Interactive Patient Education [employer].

## 2016-02-11 MED FILL — traMADol HCL 50 MG TABS: 50 | 45 days supply | Qty: 90 | Fill #0

## 2016-02-11 MED FILL — ONDANSETRON ODT 4 MG TABLET: 4 | 7 days supply | Qty: 20 | Fill #0

## 2016-02-13 ENCOUNTER — Encounter (HOSPITAL_COMMUNITY): Payer: Self-pay | Admitting: Emergency Medicine

## 2016-02-13 ENCOUNTER — Emergency Department (HOSPITAL_COMMUNITY)
Admission: EM | Admit: 2016-02-13 | Discharge: 2016-02-13 | Disposition: A | Discharge status: Home / Self Care | Payer: No Typology Code available for payment source | Attending: Emergency Medicine | Admitting: Emergency Medicine

## 2016-02-13 DIAGNOSIS — Z79899 Other long term (current) drug therapy: Secondary | ICD-10-CM | POA: Insufficient documentation

## 2016-02-13 DIAGNOSIS — R1084 Generalized abdominal pain: Secondary | ICD-10-CM | POA: Insufficient documentation

## 2016-02-13 DIAGNOSIS — G8929 Other chronic pain: Secondary | ICD-10-CM | POA: Insufficient documentation

## 2016-02-13 DIAGNOSIS — Z9049 Acquired absence of other specified parts of digestive tract: Secondary | ICD-10-CM | POA: Insufficient documentation

## 2016-02-13 DIAGNOSIS — Z872 Personal history of diseases of the skin and subcutaneous tissue: Secondary | ICD-10-CM | POA: Insufficient documentation

## 2016-02-13 DIAGNOSIS — N189 Chronic kidney disease, unspecified: Secondary | ICD-10-CM | POA: Insufficient documentation

## 2016-02-13 DIAGNOSIS — R109 Unspecified abdominal pain: Secondary | ICD-10-CM

## 2016-02-13 DIAGNOSIS — Z792 Long term (current) use of antibiotics: Secondary | ICD-10-CM | POA: Insufficient documentation

## 2016-02-13 DIAGNOSIS — Z87442 Personal history of urinary calculi: Secondary | ICD-10-CM | POA: Insufficient documentation

## 2016-02-13 DIAGNOSIS — Z87891 Personal history of nicotine dependence: Secondary | ICD-10-CM | POA: Insufficient documentation

## 2016-02-13 LAB — COMPREHENSIVE METABOLIC PANEL
ALT: 17 U/L (ref 17–63)
AST: 24 U/L (ref 15–41)
Albumin: 4 g/dL (ref 3.5–5.0)
Alkaline Phosphatase: 60 U/L (ref 38–126)
Anion gap: 10 (ref 5–15)
BUN: 12 mg/dL (ref 6–20)
CO2: 23 mmol/L (ref 22–32)
Calcium: 9.2 mg/dL (ref 8.9–10.3)
Chloride: 101 mmol/L (ref 101–111)
Creatinine, Ser: 0.84 mg/dL (ref 0.61–1.24)
GFR calc Af Amer: >60 mL/min (ref >60)
GFR calc non Af Amer: >60 mL/min (ref >60)
Glucose, Bld: 104 mg/dL — ABNORMAL HIGH (ref 65–99)
Potassium: 4 mmol/L (ref 3.5–5.1)
Sodium: 134 mmol/L — ABNORMAL LOW (ref 135–145)
Total Bilirubin: 1.3 mg/dL — ABNORMAL HIGH (ref 0.3–1.2)
Total Protein: 7.4 g/dL (ref 6.5–8.1)

## 2016-02-13 LAB — URINALYSIS, ROUTINE W REFLEX MICROSCOPIC
Glucose, UA: NEGATIVE mg/dL
Hgb urine dipstick: NEGATIVE
Ketones, ur: 15 mg/dL — AB
Leukocytes, UA: NEGATIVE
Nitrite: NEGATIVE
Protein, ur: NEGATIVE mg/dL
Specific Gravity, Urine: 1.037 — ABNORMAL HIGH (ref 1.005–1.030)
pH: 6 (ref 5.0–8.0)

## 2016-02-13 LAB — CBC
HCT: 42.9 % (ref 39.0–52.0)
Hemoglobin: 14.6 g/dL (ref 13.0–17.0)
MCH: 30.7 pg (ref 26.0–34.0)
MCHC: 34 g/dL (ref 30.0–36.0)
MCV: 90.3 fL (ref 78.0–100.0)
Platelets: 219 10*3/uL (ref 150–400)
RBC: 4.75 MIL/uL (ref 4.22–5.81)
RDW: 12.8 % (ref 11.5–15.5)
WBC: 7.5 10*3/uL (ref 4.0–10.5)

## 2016-02-13 LAB — LIPASE, BLOOD: Lipase: 29 U/L (ref 11–51)

## 2016-02-13 MED ORDER — DICYCLOMINE HCL 10 MG PO CAPS
10.0000 mg | ORAL_CAPSULE | Freq: Once | ORAL | Status: AC
  Administered 2016-02-13: 10 mg via ORAL
  Filled 2016-02-13: qty 1

## 2016-02-13 MED ORDER — HALOPERIDOL LACTATE 5 MG/ML IJ SOLN
2.0000 mg | Freq: Once | INTRAMUSCULAR | Status: AC
  Administered 2016-02-13: 2 mg via INTRAVENOUS
  Filled 2016-02-13: qty 1

## 2016-02-13 MED ORDER — SODIUM CHLORIDE 0.9 % IV BOLUS (SEPSIS)
1000.0000 mL | Freq: Once | INTRAVENOUS | Status: AC
  Administered 2016-02-13: 1000 mL via INTRAVENOUS
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
## 2016-02-13 NOTE — ED Notes (Signed)
Patient complaining of abdominal pain. Patient came by EMS. Patient has called EMS twice tonight. Patient walked to EMS truck without any problem.

## 2016-02-13 NOTE — Discharge Instructions (Signed)

## 2016-02-28 ENCOUNTER — Telehealth: Payer: Self-pay | Admitting: Internal Medicine

## 2016-02-28 NOTE — Telephone Encounter (Signed)
Patient is requesting medication refill for tramadol ° °

## 2016-02-29 ENCOUNTER — Other Ambulatory Visit: Payer: Self-pay | Admitting: *Deleted

## 2016-02-29 DIAGNOSIS — G8929 Other chronic pain: Secondary | ICD-10-CM

## 2016-02-29 DIAGNOSIS — R1031 Right lower quadrant pain: Principal | ICD-10-CM

## 2016-02-29 NOTE — Telephone Encounter (Signed)
Patient received last refill on 01/28/16

## 2016-03-06 MED ORDER — TRAMADOL HCL 50 MG PO TABS: 50.0000 mg | ORAL_TABLET | Freq: Two times a day (BID) | ORAL | Status: DC | PRN

## 2016-03-08 MED FILL — ?OMEPRAZOLE DR 20 MG CAPSUL: 20 | 30 days supply | Qty: 30 | Fill #1

## 2016-03-08 MED FILL — ?CYCLOBENZAPRINE 10 MG TABL: 10 | 20 days supply | Qty: 60 | Fill #2

## 2016-03-08 NOTE — Telephone Encounter (Signed)
Patient verified DOB Patient is aware of prescription for Tramadol being placed at the front desk for pickup Patient had no further questions at this time.

## 2016-03-09 ENCOUNTER — Telehealth: Payer: Self-pay | Admitting: Internal Medicine

## 2016-03-09 NOTE — Telephone Encounter (Signed)
Pt. Called requesting a med refill on ondansetron (ZOFRAN ODT) 4 MG disintegrating tablet. Please f/u with pt.

## 2016-03-14 ENCOUNTER — Other Ambulatory Visit: Payer: Self-pay | Admitting: *Deleted

## 2016-03-14 MED ORDER — ONDANSETRON 4 MG PO TBDP: 4.0000 mg | ORAL_TABLET | Freq: Three times a day (TID) | ORAL | Status: DC | PRN

## 2016-03-14 NOTE — Telephone Encounter (Signed)
Zofran was refilled for 30 day supply.

## 2016-03-14 NOTE — Telephone Encounter (Signed)
Patient was last seen on 01/06/16. Patient received a refill on Zofran for prn use for 30 days.

## 2016-03-19 ENCOUNTER — Emergency Department (HOSPITAL_COMMUNITY)
Admission: EM | Admit: 2016-03-19 | Discharge: 2016-03-19 | Disposition: A | Discharge status: Home / Self Care | Payer: No Typology Code available for payment source | Attending: Emergency Medicine | Admitting: Emergency Medicine

## 2016-03-19 ENCOUNTER — Encounter (HOSPITAL_COMMUNITY): Payer: Self-pay | Admitting: *Deleted

## 2016-03-19 DIAGNOSIS — R1011 Right upper quadrant pain: Secondary | ICD-10-CM | POA: Insufficient documentation

## 2016-03-19 DIAGNOSIS — Z79899 Other long term (current) drug therapy: Secondary | ICD-10-CM | POA: Insufficient documentation

## 2016-03-19 DIAGNOSIS — Z87891 Personal history of nicotine dependence: Secondary | ICD-10-CM | POA: Insufficient documentation

## 2016-03-19 DIAGNOSIS — Z872 Personal history of diseases of the skin and subcutaneous tissue: Secondary | ICD-10-CM | POA: Insufficient documentation

## 2016-03-19 DIAGNOSIS — Z792 Long term (current) use of antibiotics: Secondary | ICD-10-CM | POA: Insufficient documentation

## 2016-03-19 DIAGNOSIS — N189 Chronic kidney disease, unspecified: Secondary | ICD-10-CM | POA: Insufficient documentation

## 2016-03-19 DIAGNOSIS — R109 Unspecified abdominal pain: Secondary | ICD-10-CM

## 2016-03-19 DIAGNOSIS — G8929 Other chronic pain: Secondary | ICD-10-CM | POA: Insufficient documentation

## 2016-03-19 DIAGNOSIS — R3129 Other microscopic hematuria: Secondary | ICD-10-CM | POA: Insufficient documentation

## 2016-03-19 DIAGNOSIS — Z9889 Other specified postprocedural states: Secondary | ICD-10-CM | POA: Insufficient documentation

## 2016-03-19 DIAGNOSIS — Z9049 Acquired absence of other specified parts of digestive tract: Secondary | ICD-10-CM | POA: Insufficient documentation

## 2016-03-19 DIAGNOSIS — R61 Generalized hyperhidrosis: Secondary | ICD-10-CM | POA: Insufficient documentation

## 2016-03-19 DIAGNOSIS — Z87828 Personal history of other (healed) physical injury and trauma: Secondary | ICD-10-CM | POA: Insufficient documentation

## 2016-03-19 DIAGNOSIS — Z8719 Personal history of other diseases of the digestive system: Secondary | ICD-10-CM | POA: Insufficient documentation

## 2016-03-19 DIAGNOSIS — R1031 Right lower quadrant pain: Secondary | ICD-10-CM | POA: Insufficient documentation

## 2016-03-19 LAB — CBC WITH DIFFERENTIAL/PLATELET
Basophils Absolute: 0 10*3/uL (ref 0.0–0.1)
Basophils Relative: 0 %
Eosinophils Absolute: 0 10*3/uL (ref 0.0–0.7)
Eosinophils Relative: 0 %
HCT: 39.5 % (ref 39.0–52.0)
Hemoglobin: 13.8 g/dL (ref 13.0–17.0)
Lymphocytes Relative: 25 %
Lymphs Abs: 2.3 10*3/uL (ref 0.7–4.0)
MCH: 30.8 pg (ref 26.0–34.0)
MCHC: 34.9 g/dL (ref 30.0–36.0)
MCV: 88.2 fL (ref 78.0–100.0)
Monocytes Absolute: 0.7 10*3/uL (ref 0.1–1.0)
Monocytes Relative: 8 %
Neutro Abs: 6.4 10*3/uL (ref 1.7–7.7)
Neutrophils Relative %: 67 %
Platelets: 209 10*3/uL (ref 150–400)
RBC: 4.48 MIL/uL (ref 4.22–5.81)
RDW: 13.2 % (ref 11.5–15.5)
WBC: 9.5 10*3/uL (ref 4.0–10.5)

## 2016-03-19 LAB — COMPREHENSIVE METABOLIC PANEL
ALT: 17 U/L (ref 17–63)
AST: 22 U/L (ref 15–41)
Albumin: 4.4 g/dL (ref 3.5–5.0)
Alkaline Phosphatase: 72 U/L (ref 38–126)
Anion gap: 9 (ref 5–15)
BUN: 8 mg/dL (ref 6–20)
CO2: 23 mmol/L (ref 22–32)
Calcium: 9.3 mg/dL (ref 8.9–10.3)
Chloride: 110 mmol/L (ref 101–111)
Creatinine, Ser: 0.92 mg/dL (ref 0.61–1.24)
GFR calc Af Amer: >60 mL/min (ref >60)
GFR calc non Af Amer: >60 mL/min (ref >60)
Glucose, Bld: 101 mg/dL — ABNORMAL HIGH (ref 65–99)
Potassium: 4.3 mmol/L (ref 3.5–5.1)
Sodium: 142 mmol/L (ref 135–145)
Total Bilirubin: 0.7 mg/dL (ref 0.3–1.2)
Total Protein: 7.7 g/dL (ref 6.5–8.1)

## 2016-03-19 LAB — URINALYSIS, ROUTINE W REFLEX MICROSCOPIC
Bilirubin Urine: NEGATIVE
Glucose, UA: NEGATIVE mg/dL
Ketones, ur: NEGATIVE mg/dL
Nitrite: NEGATIVE
Protein, ur: NEGATIVE mg/dL
Specific Gravity, Urine: 1.028 (ref 1.005–1.030)
pH: 5.5 (ref 5.0–8.0)

## 2016-03-19 LAB — URINE MICROSCOPIC-ADD ON: Squamous Epithelial / LPF: NONE SEEN

## 2016-03-19 LAB — LIPASE, BLOOD: Lipase: 22 U/L (ref 11–51)

## 2016-03-19 MED ORDER — HALOPERIDOL LACTATE 5 MG/ML IJ SOLN
1.0000 mg | Freq: Once | INTRAMUSCULAR | Status: AC
  Administered 2016-03-19: 1 mg via INTRAVENOUS
  Filled 2016-03-19: qty 1

## 2016-03-19 MED ORDER — HYDROMORPHONE HCL 1 MG/ML IJ SOLN
1.0000 mg | Freq: Once | INTRAMUSCULAR | Status: AC
  Administered 2016-03-19: 1 mg via INTRAVENOUS

## 2016-03-19 MED ORDER — SODIUM CHLORIDE 0.9 % IV BOLUS (SEPSIS)
1000.0000 mL | Freq: Once | INTRAVENOUS | Status: AC
  Administered 2016-03-19: 1000 mL via INTRAVENOUS

## 2016-03-19 MED ORDER — FENTANYL CITRATE (PF) 100 MCG/2ML IJ SOLN
50.0000 ug | INTRAMUSCULAR | Status: DC | PRN
Start: 2016-03-19 — End: 2016-03-19
  Filled 2016-03-19: qty 2

## 2016-03-19 MED ORDER — DICYCLOMINE HCL 10 MG PO CAPS
10.0000 mg | ORAL_CAPSULE | Freq: Once | ORAL | Status: AC
  Administered 2016-03-19: 10 mg via ORAL
  Filled 2016-03-19: qty 1

## 2016-03-19 MED ORDER — HYDROMORPHONE HCL 1 MG/ML IJ SOLN
INTRAMUSCULAR | Status: AC
  Administered 2016-03-19: 1 mg via INTRAVENOUS
  Filled 2016-03-19: qty 1
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
## 2016-03-19 NOTE — ED Notes (Signed)
I attempted x1 to stick patient after multiple staff attempted, I was unable to obtain blood. RN notified, Main lab phlebotomy called.

## 2016-03-19 NOTE — Discharge Instructions (Signed)
Please call Dr. Arlyn Leak office to schedule a urology appointment as soon as possible. In the meantime take your home pain medications as needed for pain. Return to the ER for new or worsening symptoms.

## 2016-03-19 NOTE — ED Notes (Signed)
Unable to physically locate urine culture paper. Lab reports they will add the urine culture on.

## 2016-03-19 NOTE — ED Notes (Signed)
Rt sided pain since last night, able to urinate, history of kidney stones

## 2016-03-19 NOTE — ED Notes (Signed)
Waiting for patient to calm down before attempting lab draw. Patient has been given pain meds.

## 2016-03-21 LAB — URINE CULTURE

## 2016-03-23 MED FILL — traMADol HCL 50 MG TABS: 50 | 45 days supply | Qty: 90 | Fill #0

## 2016-03-23 MED FILL — ONDANSETRON ODT 4 MG TABLET: 4 | 13 days supply | Qty: 15 | Fill #1

## 2016-04-10 ENCOUNTER — Telehealth: Payer: Self-pay | Admitting: Internal Medicine

## 2016-04-10 ENCOUNTER — Encounter (HOSPITAL_COMMUNITY): Payer: Self-pay | Admitting: *Deleted

## 2016-04-10 DIAGNOSIS — N189 Chronic kidney disease, unspecified: Secondary | ICD-10-CM | POA: Diagnosis not present

## 2016-04-10 DIAGNOSIS — G8929 Other chronic pain: Secondary | ICD-10-CM | POA: Insufficient documentation

## 2016-04-10 DIAGNOSIS — E860 Dehydration: Secondary | ICD-10-CM | POA: Diagnosis not present

## 2016-04-10 DIAGNOSIS — R109 Unspecified abdominal pain: Secondary | ICD-10-CM | POA: Insufficient documentation

## 2016-04-10 DIAGNOSIS — Z79899 Other long term (current) drug therapy: Secondary | ICD-10-CM | POA: Diagnosis not present

## 2016-04-10 DIAGNOSIS — Z87891 Personal history of nicotine dependence: Secondary | ICD-10-CM | POA: Diagnosis not present

## 2016-04-10 DIAGNOSIS — K5900 Constipation, unspecified: Secondary | ICD-10-CM | POA: Insufficient documentation

## 2016-04-10 DIAGNOSIS — Z87442 Personal history of urinary calculi: Secondary | ICD-10-CM | POA: Diagnosis not present

## 2016-04-10 DIAGNOSIS — Z9049 Acquired absence of other specified parts of digestive tract: Secondary | ICD-10-CM | POA: Diagnosis not present

## 2016-04-10 DIAGNOSIS — Z87828 Personal history of other (healed) physical injury and trauma: Secondary | ICD-10-CM | POA: Diagnosis not present

## 2016-04-10 LAB — URINALYSIS, ROUTINE W REFLEX MICROSCOPIC
Glucose, UA: NEGATIVE mg/dL
Ketones, ur: NEGATIVE mg/dL
Leukocytes, UA: NEGATIVE
Nitrite: NEGATIVE
Protein, ur: NEGATIVE mg/dL
Specific Gravity, Urine: 1.034 — ABNORMAL HIGH (ref 1.005–1.030)
pH: 5 (ref 5.0–8.0)

## 2016-04-10 LAB — URINE MICROSCOPIC-ADD ON

## 2016-04-10 LAB — CBC
HCT: 46.8 % (ref 39.0–52.0)
Hemoglobin: 16.4 g/dL (ref 13.0–17.0)
MCH: 31.5 pg (ref 26.0–34.0)
MCHC: 35 g/dL (ref 30.0–36.0)
MCV: 90 fL (ref 78.0–100.0)
Platelets: 248 10*3/uL (ref 150–400)
RBC: 5.2 MIL/uL (ref 4.22–5.81)
RDW: 12.9 % (ref 11.5–15.5)
WBC: 8.4 10*3/uL (ref 4.0–10.5)

## 2016-04-10 LAB — COMPREHENSIVE METABOLIC PANEL
ALT: 25 U/L (ref 17–63)
AST: 24 U/L (ref 15–41)
Albumin: 4.2 g/dL (ref 3.5–5.0)
Alkaline Phosphatase: 82 U/L (ref 38–126)
Anion gap: 13 (ref 5–15)
BUN: 24 mg/dL — ABNORMAL HIGH (ref 6–20)
CO2: 26 mmol/L (ref 22–32)
Calcium: 9.9 mg/dL (ref 8.9–10.3)
Chloride: 95 mmol/L — ABNORMAL LOW (ref 101–111)
Creatinine, Ser: 1.64 mg/dL — ABNORMAL HIGH (ref 0.61–1.24)
GFR calc Af Amer: 54 mL/min — ABNORMAL LOW (ref >60)
GFR calc non Af Amer: 47 mL/min — ABNORMAL LOW (ref >60)
Glucose, Bld: 112 mg/dL — ABNORMAL HIGH (ref 65–99)
Potassium: 4 mmol/L (ref 3.5–5.1)
Sodium: 134 mmol/L — ABNORMAL LOW (ref 135–145)
Total Bilirubin: 1 mg/dL (ref 0.3–1.2)
Total Protein: 8.1 g/dL (ref 6.5–8.1)

## 2016-04-10 LAB — LIPASE, BLOOD: Lipase: 34 U/L (ref 11–51)

## 2016-04-10 MED ORDER — ONDANSETRON 4 MG PO TBDP
4.0000 mg | ORAL_TABLET | Freq: Once | ORAL | Status: AC | PRN
  Administered 2016-04-10: 4 mg via ORAL

## 2016-04-10 MED ORDER — ONDANSETRON 4 MG PO TBDP
ORAL_TABLET | ORAL | Status: AC
  Filled 2016-04-10: qty 1

## 2016-04-10 NOTE — ED Notes (Signed)
Pt c/o abdominal pain since Saturday. Reports n/v, denies diarrhea

## 2016-04-10 NOTE — Telephone Encounter (Signed)
Patient called requesting medication refill on cyclobenzaprine (FLEXERIL) 10 MG tablet, omeprazole (PRILOSEC) 20 MG capsule, ondansetron (ZOFRAN ODT) 4 MG disintegrating tablet, docusate sodium (STOOL SOFTENER) 100 MG capsule  Please f/up with pt

## 2016-04-11 ENCOUNTER — Emergency Department (HOSPITAL_COMMUNITY)
Admission: EM | Admit: 2016-04-11 | Discharge: 2016-04-11 | Disposition: A | Discharge status: Home / Self Care | Payer: Medicaid Other | Attending: Emergency Medicine | Admitting: Emergency Medicine

## 2016-04-11 DIAGNOSIS — G8929 Other chronic pain: Secondary | ICD-10-CM

## 2016-04-11 DIAGNOSIS — R109 Unspecified abdominal pain: Secondary | ICD-10-CM

## 2016-04-11 DIAGNOSIS — E860 Dehydration: Secondary | ICD-10-CM
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
## 2016-04-11 MED ORDER — HYDROMORPHONE HCL 1 MG/ML IJ SOLN
2.0000 mg | Freq: Once | INTRAMUSCULAR | Status: AC
  Administered 2016-04-11: 2 mg via INTRAVENOUS
  Filled 2016-04-11: qty 2

## 2016-04-11 MED ORDER — METOCLOPRAMIDE HCL 5 MG/ML IJ SOLN
10.0000 mg | Freq: Once | INTRAMUSCULAR | Status: AC
  Administered 2016-04-11: 10 mg via INTRAVENOUS
  Filled 2016-04-11: qty 2

## 2016-04-11 MED ORDER — ONDANSETRON HCL 4 MG/2ML IJ SOLN
4.0000 mg | Freq: Once | INTRAMUSCULAR | Status: AC
  Administered 2016-04-11: 4 mg via INTRAVENOUS
  Filled 2016-04-11: qty 2

## 2016-04-11 MED ORDER — SODIUM CHLORIDE 0.9 % IV BOLUS (SEPSIS)
1000.0000 mL | Freq: Once | INTRAVENOUS | Status: AC
  Administered 2016-04-11: 1000 mL via INTRAVENOUS

## 2016-04-11 MED ORDER — HYDROMORPHONE HCL 1 MG/ML IJ SOLN
1.0000 mg | Freq: Once | INTRAMUSCULAR | Status: AC
  Administered 2016-04-11: 1 mg via INTRAVENOUS
  Filled 2016-04-11: qty 1

## 2016-04-11 NOTE — ED Notes (Signed)
Pt given Sprite and apple juice per request

## 2016-04-11 NOTE — Discharge Instructions (Signed)
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
## 2016-04-11 NOTE — ED Notes (Signed)
Patient in room turning his body all over the bed and yelling out and moaning loudly; pt states he has not taken anything for pain but been in severe pain since Saturday; Pt states he takes tramadol for pain

## 2016-04-11 NOTE — ED Notes (Signed)
Pt verbalized understanding of d/c instructions and follow-up care. No further questions/concerns, VSS, ambulatory w/ steady gait (refused wheelchair) 

## 2016-04-18 ENCOUNTER — Other Ambulatory Visit: Payer: Self-pay | Admitting: *Deleted

## 2016-04-18 DIAGNOSIS — M62838 Other muscle spasm: Secondary | ICD-10-CM

## 2016-04-18 DIAGNOSIS — K2100 Gastro-esophageal reflux disease with esophagitis, without bleeding: Secondary | ICD-10-CM

## 2016-04-18 NOTE — Telephone Encounter (Signed)
Patient was last seen on 01/06/16. Patient is requesting a refill on Flexeril, Prilosec, Zofran, and Colace.

## 2016-04-19 MED ORDER — DOCUSATE SODIUM 100 MG PO CAPS: 100.0000 mg | ORAL_CAPSULE | Freq: Every day | ORAL | Status: DC

## 2016-04-19 MED ORDER — CYCLOBENZAPRINE HCL 10 MG PO TABS: 10.0000 mg | ORAL_TABLET | Freq: Three times a day (TID) | ORAL | Status: DC | PRN

## 2016-04-19 MED ORDER — OMEPRAZOLE 20 MG PO CPDR: 20.0000 mg | DELAYED_RELEASE_CAPSULE | Freq: Every day | ORAL | Status: DC

## 2016-04-19 MED ORDER — ONDANSETRON 4 MG PO TBDP: 4.0000 mg | ORAL_TABLET | Freq: Three times a day (TID) | ORAL | Status: DC | PRN

## 2016-04-27 ENCOUNTER — Encounter (HOSPITAL_COMMUNITY): Payer: Self-pay | Admitting: Emergency Medicine

## 2016-04-27 ENCOUNTER — Emergency Department (HOSPITAL_COMMUNITY)
Admission: EM | Admit: 2016-04-27 | Discharge: 2016-04-27 | Disposition: A | Discharge status: Home / Self Care | Payer: Medicaid Other | Attending: Emergency Medicine | Admitting: Emergency Medicine

## 2016-04-27 ENCOUNTER — Emergency Department (HOSPITAL_COMMUNITY): Payer: Medicaid Other

## 2016-04-27 DIAGNOSIS — R1084 Generalized abdominal pain: Secondary | ICD-10-CM | POA: Diagnosis not present

## 2016-04-27 DIAGNOSIS — Z79899 Other long term (current) drug therapy: Secondary | ICD-10-CM | POA: Insufficient documentation

## 2016-04-27 DIAGNOSIS — E860 Dehydration: Secondary | ICD-10-CM | POA: Diagnosis not present

## 2016-04-27 DIAGNOSIS — Z792 Long term (current) use of antibiotics: Secondary | ICD-10-CM | POA: Insufficient documentation

## 2016-04-27 DIAGNOSIS — Z87891 Personal history of nicotine dependence: Secondary | ICD-10-CM | POA: Diagnosis not present

## 2016-04-27 DIAGNOSIS — R109 Unspecified abdominal pain: Secondary | ICD-10-CM | POA: Diagnosis present

## 2016-04-27 LAB — URINALYSIS, ROUTINE W REFLEX MICROSCOPIC
Glucose, UA: NEGATIVE mg/dL
Hgb urine dipstick: NEGATIVE
Ketones, ur: NEGATIVE mg/dL
Leukocytes, UA: NEGATIVE
Nitrite: NEGATIVE
Protein, ur: NEGATIVE mg/dL
Specific Gravity, Urine: 1.034 — ABNORMAL HIGH (ref 1.005–1.030)
pH: 6 (ref 5.0–8.0)

## 2016-04-27 LAB — COMPREHENSIVE METABOLIC PANEL
ALT: 31 U/L (ref 17–63)
AST: 28 U/L (ref 15–41)
Albumin: 4.4 g/dL (ref 3.5–5.0)
Alkaline Phosphatase: 63 U/L (ref 38–126)
Anion gap: 8 (ref 5–15)
BUN: 10 mg/dL (ref 6–20)
CO2: 27 mmol/L (ref 22–32)
Calcium: 9.4 mg/dL (ref 8.9–10.3)
Chloride: 103 mmol/L (ref 101–111)
Creatinine, Ser: 1.01 mg/dL (ref 0.61–1.24)
GFR calc Af Amer: >60 mL/min (ref >60)
GFR calc non Af Amer: >60 mL/min (ref >60)
Glucose, Bld: 98 mg/dL (ref 65–99)
Potassium: 3.7 mmol/L (ref 3.5–5.1)
Sodium: 138 mmol/L (ref 135–145)
Total Bilirubin: 0.6 mg/dL (ref 0.3–1.2)
Total Protein: 8 g/dL (ref 6.5–8.1)

## 2016-04-27 LAB — CBC WITH DIFFERENTIAL/PLATELET
Basophils Absolute: 0 10*3/uL (ref 0.0–0.1)
Basophils Relative: 0 %
Eosinophils Absolute: 0.1 10*3/uL (ref 0.0–0.7)
Eosinophils Relative: 1 %
HCT: 42.8 % (ref 39.0–52.0)
Hemoglobin: 15 g/dL (ref 13.0–17.0)
Lymphocytes Relative: 40 %
Lymphs Abs: 3.8 10*3/uL (ref 0.7–4.0)
MCH: 31.8 pg (ref 26.0–34.0)
MCHC: 35 g/dL (ref 30.0–36.0)
MCV: 90.7 fL (ref 78.0–100.0)
Monocytes Absolute: 0.5 10*3/uL (ref 0.1–1.0)
Monocytes Relative: 5 %
Neutro Abs: 5.1 10*3/uL (ref 1.7–7.7)
Neutrophils Relative %: 54 %
Platelets: 326 10*3/uL (ref 150–400)
RBC: 4.72 MIL/uL (ref 4.22–5.81)
RDW: 13.5 % (ref 11.5–15.5)
WBC: 9.4 10*3/uL (ref 4.0–10.5)

## 2016-04-27 LAB — RAPID URINE DRUG SCREEN, HOSP PERFORMED
Amphetamines: NOT DETECTED
Barbiturates: NOT DETECTED
Benzodiazepines: NOT DETECTED
Cocaine: NOT DETECTED
Opiates: NOT DETECTED
Tetrahydrocannabinol: POSITIVE — AB

## 2016-04-27 MED ORDER — SODIUM CHLORIDE 0.9 % IV BOLUS (SEPSIS)
1000.0000 mL | Freq: Once | INTRAVENOUS | Status: AC
  Administered 2016-04-27: 1000 mL via INTRAVENOUS

## 2016-04-27 MED ORDER — HYDROMORPHONE HCL 1 MG/ML IJ SOLN
1.0000 mg | Freq: Once | INTRAMUSCULAR | Status: AC
  Administered 2016-04-27: 1 mg via INTRAVENOUS
  Filled 2016-04-27: qty 1

## 2016-04-27 MED ORDER — DIPHENHYDRAMINE HCL 50 MG/ML IJ SOLN
25.0000 mg | Freq: Once | INTRAMUSCULAR | Status: AC
  Administered 2016-04-27: 25 mg via INTRAVENOUS
  Filled 2016-04-27: qty 1

## 2016-04-27 MED ORDER — DICYCLOMINE HCL 20 MG PO TABS: 20.0000 mg | ORAL_TABLET | Freq: Two times a day (BID) | ORAL | Status: DC

## 2016-04-27 MED ORDER — ONDANSETRON HCL 4 MG PO TABS: 4.0000 mg | ORAL_TABLET | Freq: Four times a day (QID) | ORAL | Status: DC

## 2016-04-27 MED ORDER — ONDANSETRON HCL 4 MG/2ML IJ SOLN
4.0000 mg | Freq: Once | INTRAMUSCULAR | Status: AC
  Administered 2016-04-27: 4 mg via INTRAVENOUS
  Filled 2016-04-27: qty 2

## 2016-04-27 NOTE — ED Notes (Signed)
Made Dr Gilford Raid aware of IV attempts by two RNs and no success.  IV team consult made aware.

## 2016-04-27 NOTE — ED Notes (Signed)
RN attempted IV access, unsuccessful.

## 2016-04-27 NOTE — ED Notes (Signed)
Bed: [id number] Expected date:  Expected time:  Means of arrival:  Comments: EMS- 50yo M, flank pain/Hx of kidney stones

## 2016-04-27 NOTE — ED Notes (Signed)
IV team at bedside 
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
## 2016-04-27 NOTE — ED Notes (Signed)
This RN attempted IV, not successful. Consult put in for IV team. Primary RN made aware of unsuccessful attempt.

## 2016-04-27 NOTE — ED Notes (Signed)
Per EMS pt comes from home c/o right flank pain since last night.  Patient states "sometimes kinda complicated when i am using the bathroom".  Patient denies blood in urine or n/v.  Patient has PMH kidney stones.

## 2016-04-27 NOTE — Discharge Instructions (Signed)

## 2016-04-29 ENCOUNTER — Emergency Department (HOSPITAL_COMMUNITY)
Admission: EM | Admit: 2016-04-29 | Discharge: 2016-04-30 | Disposition: A | Discharge status: Home / Self Care | Payer: Medicaid Other | Attending: Emergency Medicine | Admitting: Emergency Medicine

## 2016-04-29 ENCOUNTER — Encounter (HOSPITAL_COMMUNITY): Payer: Self-pay | Admitting: Emergency Medicine

## 2016-04-29 DIAGNOSIS — N189 Chronic kidney disease, unspecified: Secondary | ICD-10-CM | POA: Diagnosis not present

## 2016-04-29 DIAGNOSIS — G8929 Other chronic pain: Secondary | ICD-10-CM | POA: Insufficient documentation

## 2016-04-29 DIAGNOSIS — R1084 Generalized abdominal pain: Secondary | ICD-10-CM | POA: Diagnosis not present

## 2016-04-29 DIAGNOSIS — Z87891 Personal history of nicotine dependence: Secondary | ICD-10-CM | POA: Diagnosis not present

## 2016-04-29 DIAGNOSIS — R109 Unspecified abdominal pain: Secondary | ICD-10-CM

## 2016-04-29 LAB — COMPREHENSIVE METABOLIC PANEL
ALT: 28 U/L (ref 17–63)
AST: 30 U/L (ref 15–41)
Albumin: 3.8 g/dL (ref 3.5–5.0)
Alkaline Phosphatase: 62 U/L (ref 38–126)
Anion gap: 5 (ref 5–15)
BUN: 7 mg/dL (ref 6–20)
CO2: 28 mmol/L (ref 22–32)
Calcium: 9.3 mg/dL (ref 8.9–10.3)
Chloride: 106 mmol/L (ref 101–111)
Creatinine, Ser: 0.93 mg/dL (ref 0.61–1.24)
GFR calc Af Amer: >60 mL/min (ref >60)
GFR calc non Af Amer: >60 mL/min (ref >60)
Glucose, Bld: 114 mg/dL — ABNORMAL HIGH (ref 65–99)
Potassium: 4.6 mmol/L (ref 3.5–5.1)
Sodium: 139 mmol/L (ref 135–145)
Total Bilirubin: 0.6 mg/dL (ref 0.3–1.2)
Total Protein: 7 g/dL (ref 6.5–8.1)

## 2016-04-29 LAB — CBC
HCT: 37 % — ABNORMAL LOW (ref 39.0–52.0)
Hemoglobin: 12.7 g/dL — ABNORMAL LOW (ref 13.0–17.0)
MCH: 31.1 pg (ref 26.0–34.0)
MCHC: 34.3 g/dL (ref 30.0–36.0)
MCV: 90.7 fL (ref 78.0–100.0)
Platelets: 275 10*3/uL (ref 150–400)
RBC: 4.08 MIL/uL — ABNORMAL LOW (ref 4.22–5.81)
RDW: 13.7 % (ref 11.5–15.5)
WBC: 7.4 10*3/uL (ref 4.0–10.5)

## 2016-04-29 LAB — LIPASE, BLOOD: Lipase: 71 U/L — ABNORMAL HIGH (ref 11–51)

## 2016-04-29 NOTE — ED Notes (Signed)
Patient presents from home via EMS for chronic abdominal pain x1 day. Seen two days ago for same, no relief with Tramadol, reports N/V "all day long".   [id number], 93/44

## 2016-04-30 ENCOUNTER — Encounter (HOSPITAL_COMMUNITY): Payer: Self-pay | Admitting: Emergency Medicine

## 2016-04-30 LAB — URINALYSIS, ROUTINE W REFLEX MICROSCOPIC
Bilirubin Urine: NEGATIVE
Glucose, UA: NEGATIVE mg/dL
Hgb urine dipstick: NEGATIVE
Ketones, ur: NEGATIVE mg/dL
Leukocytes, UA: NEGATIVE
Nitrite: NEGATIVE
Protein, ur: NEGATIVE mg/dL
Specific Gravity, Urine: 1.033 — ABNORMAL HIGH (ref 1.005–1.030)
pH: 7 (ref 5.0–8.0)

## 2016-04-30 MED ORDER — DICYCLOMINE HCL 10 MG/ML IM SOLN
20.0000 mg | Freq: Once | INTRAMUSCULAR | Status: AC
  Administered 2016-04-30: 20 mg via INTRAMUSCULAR
  Filled 2016-04-30: qty 2

## 2016-04-30 MED ORDER — HALOPERIDOL LACTATE 5 MG/ML IJ SOLN
5.0000 mg | Freq: Once | INTRAMUSCULAR | Status: AC
  Administered 2016-04-30: 5 mg via INTRAMUSCULAR
  Filled 2016-04-30: qty 1

## 2016-04-30 MED ORDER — DICYCLOMINE HCL 20 MG PO TABS: 20.0000 mg | ORAL_TABLET | Freq: Two times a day (BID) | ORAL | Status: DC

## 2016-04-30 NOTE — ED Notes (Signed)
Pt reports understanding of discharge information. No questions at time of discharge 
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
## 2016-05-08 ENCOUNTER — Emergency Department (HOSPITAL_COMMUNITY)
Admission: EM | Admit: 2016-05-08 | Discharge: 2016-05-08 | Disposition: A | Discharge status: Home / Self Care | Payer: Medicaid Other | Attending: Emergency Medicine | Admitting: Emergency Medicine

## 2016-05-08 ENCOUNTER — Encounter (HOSPITAL_COMMUNITY): Payer: Self-pay | Admitting: Nurse Practitioner

## 2016-05-08 ENCOUNTER — Emergency Department (HOSPITAL_COMMUNITY)
Admission: EM | Admit: 2016-05-08 | Discharge: 2016-05-08 | Disposition: A | Discharge status: Home / Self Care | Payer: Medicaid Other | Source: Home / Self Care

## 2016-05-08 ENCOUNTER — Encounter (HOSPITAL_COMMUNITY): Payer: Self-pay

## 2016-05-08 DIAGNOSIS — R109 Unspecified abdominal pain: Principal | ICD-10-CM

## 2016-05-08 DIAGNOSIS — Z9049 Acquired absence of other specified parts of digestive tract: Secondary | ICD-10-CM | POA: Insufficient documentation

## 2016-05-08 DIAGNOSIS — Z8719 Personal history of other diseases of the digestive system: Secondary | ICD-10-CM | POA: Insufficient documentation

## 2016-05-08 DIAGNOSIS — R454 Irritability and anger: Secondary | ICD-10-CM | POA: Insufficient documentation

## 2016-05-08 DIAGNOSIS — N189 Chronic kidney disease, unspecified: Secondary | ICD-10-CM

## 2016-05-08 DIAGNOSIS — Z87828 Personal history of other (healed) physical injury and trauma: Secondary | ICD-10-CM | POA: Insufficient documentation

## 2016-05-08 DIAGNOSIS — Z87442 Personal history of urinary calculi: Secondary | ICD-10-CM | POA: Insufficient documentation

## 2016-05-08 DIAGNOSIS — Z872 Personal history of diseases of the skin and subcutaneous tissue: Secondary | ICD-10-CM

## 2016-05-08 DIAGNOSIS — F919 Conduct disorder, unspecified: Secondary | ICD-10-CM

## 2016-05-08 DIAGNOSIS — Z79899 Other long term (current) drug therapy: Secondary | ICD-10-CM

## 2016-05-08 DIAGNOSIS — G8929 Other chronic pain: Secondary | ICD-10-CM | POA: Insufficient documentation

## 2016-05-08 DIAGNOSIS — R1030 Lower abdominal pain, unspecified: Secondary | ICD-10-CM | POA: Diagnosis present

## 2016-05-08 DIAGNOSIS — Z9889 Other specified postprocedural states: Secondary | ICD-10-CM

## 2016-05-08 DIAGNOSIS — Z87891 Personal history of nicotine dependence: Secondary | ICD-10-CM | POA: Insufficient documentation

## 2016-05-08 DIAGNOSIS — R197 Diarrhea, unspecified: Secondary | ICD-10-CM | POA: Diagnosis not present

## 2016-05-08 DIAGNOSIS — M549 Dorsalgia, unspecified: Secondary | ICD-10-CM | POA: Insufficient documentation

## 2016-05-08 DIAGNOSIS — Z8739 Personal history of other diseases of the musculoskeletal system and connective tissue: Secondary | ICD-10-CM | POA: Insufficient documentation

## 2016-05-08 LAB — COMPREHENSIVE METABOLIC PANEL
ALT: 21 U/L (ref 17–63)
AST: 22 U/L (ref 15–41)
Albumin: 3.6 g/dL (ref 3.5–5.0)
Alkaline Phosphatase: 59 U/L (ref 38–126)
Anion gap: 7 (ref 5–15)
BUN: 8 mg/dL (ref 6–20)
CO2: 25 mmol/L (ref 22–32)
Calcium: 8.9 mg/dL (ref 8.9–10.3)
Chloride: 107 mmol/L (ref 101–111)
Creatinine, Ser: 1.1 mg/dL (ref 0.61–1.24)
GFR calc Af Amer: >60 mL/min (ref >60)
GFR calc non Af Amer: >60 mL/min (ref >60)
Glucose, Bld: 104 mg/dL — ABNORMAL HIGH (ref 65–99)
Potassium: 3.7 mmol/L (ref 3.5–5.1)
Sodium: 139 mmol/L (ref 135–145)
Total Bilirubin: 0.7 mg/dL (ref 0.3–1.2)
Total Protein: 6.6 g/dL (ref 6.5–8.1)

## 2016-05-08 LAB — CBC WITH DIFFERENTIAL/PLATELET
Basophils Absolute: 0 10*3/uL (ref 0.0–0.1)
Basophils Relative: 0 %
Eosinophils Absolute: 0.1 10*3/uL (ref 0.0–0.7)
Eosinophils Relative: 2 %
HCT: 37.2 % — ABNORMAL LOW (ref 39.0–52.0)
Hemoglobin: 12.5 g/dL — ABNORMAL LOW (ref 13.0–17.0)
Lymphocytes Relative: 47 %
Lymphs Abs: 3.8 10*3/uL (ref 0.7–4.0)
MCH: 29.7 pg (ref 26.0–34.0)
MCHC: 33.6 g/dL (ref 30.0–36.0)
MCV: 88.4 fL (ref 78.0–100.0)
Monocytes Absolute: 0.6 10*3/uL (ref 0.1–1.0)
Monocytes Relative: 8 %
Neutro Abs: 3.4 10*3/uL (ref 1.7–7.7)
Neutrophils Relative %: 43 %
Platelets: 246 10*3/uL (ref 150–400)
RBC: 4.21 MIL/uL — ABNORMAL LOW (ref 4.22–5.81)
RDW: 13.6 % (ref 11.5–15.5)
WBC: 8 10*3/uL (ref 4.0–10.5)

## 2016-05-08 LAB — LIPASE, BLOOD: Lipase: 30 U/L (ref 11–51)

## 2016-05-08 MED FILL — ONDANSETRON ODT 4 MG TABLET: 4 | 10 days supply | Qty: 30 | Fill #0

## 2016-05-08 MED FILL — ?OMEPRAZOLE DR 20 MG CAPSUL: 20 | 30 days supply | Qty: 30 | Fill #2

## 2016-05-08 MED FILL — ?CYCLOBENZAPRINE 10 MG TABL: 10 | 20 days supply | Qty: 60 | Fill #3

## 2016-05-08 NOTE — ED Notes (Signed)
Pt reports he was just discharged here for abd pain. He was unable to afford the prescriptions until 5pm today so he called ems to return to ED for pain management. He is alert and breathing easily

## 2016-05-08 NOTE — Discharge Instructions (Signed)
Chronic Pain [name], see your primary care doctor within 3 days for close follow up of your chronic pain.  If any symptoms worsen, come back to the ED immediately. Thank you. Chronic pain can be defined as pain that is off and on and lasts for 3-6 months or longer. Many things cause chronic pain, which can make it difficult to make a diagnosis. There are many treatment options available for chronic pain. However, finding a treatment that works well for you may require trying various approaches until the right one is found. Many people benefit from a combination of two or more types of treatment to control their pain. SYMPTOMS  Chronic pain can occur anywhere in the body and can range from mild to very severe. Some types of chronic pain include:  Headache.  Low back pain.  Cancer pain.  Arthritis pain.  Neurogenic pain. This is pain resulting from damage to nerves. People with chronic pain may also have other symptoms such as:  Depression.  Anger.  Insomnia.  Anxiety. DIAGNOSIS  Your health care provider will help diagnose your condition over time. In many cases, the initial focus will be on excluding possible conditions that could be causing the pain. Depending on your symptoms, your health care provider may order tests to diagnose your condition. Some of these tests may include:   Blood tests.   CT scan.   MRI.   X-rays.   Ultrasounds.   Nerve conduction studies.  You may need to see a specialist.  TREATMENT  Finding treatment that works well may take time. You may be referred to a pain specialist. He or she may prescribe medicine or therapies, such as:   Mindful meditation or yoga.  Shots (injections) of numbing or pain-relieving medicines into the spine or area of pain.  Local electrical stimulation.  Acupuncture.   Massage therapy.   Aroma, color, light, or sound therapy.   Biofeedback.   Working with a physical therapist to keep from getting  stiff.   Regular, gentle exercise.   Cognitive or behavioral therapy.   Group support.  Sometimes, surgery may be recommended.  HOME CARE INSTRUCTIONS   Take all medicines as directed by your health care provider.   Lessen stress in your life by relaxing and doing things such as listening to calming music.   Exercise or be active as directed by your health care provider.   Eat a healthy diet and include things such as vegetables, fruits, fish, and lean meats in your diet.   Keep all follow-up appointments with your health care provider.   Attend a support group with others suffering from chronic pain. SEEK MEDICAL CARE IF:   Your pain gets worse.   You develop a new pain that was not there before.   You cannot tolerate medicines given to you by your health care provider.   You have new symptoms since your last visit with your health care provider.  SEEK IMMEDIATE MEDICAL CARE IF:   You feel weak.   You have decreased sensation or numbness.   You lose control of bowel or bladder function.   Your pain suddenly gets much worse.   You develop shaking.  You develop chills.  You develop confusion.  You develop chest pain.  You develop shortness of breath.  MAKE SURE YOU:  Understand these instructions.  Will watch your condition.  Will get help right away if you are not doing well or get worse.   This information is  not intended to replace advice given to you by your health care provider. Make sure you discuss any questions you have with your health care provider.   Document Released: 08/12/2002 Document Revised: 07/23/2013 Document Reviewed: 05/16/2013 Elsevier Interactive Patient Education [employer].

## 2016-05-08 NOTE — Telephone Encounter (Signed)
Pt. Came into facility requesting a refill on Tramadol. Pt. Is very upset b/c his Rx is not ready. Please f/u with pt.
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
## 2016-05-08 NOTE — ED Notes (Signed)
Pt here for abd pain onset Sunday PM.
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
## 2016-05-08 NOTE — ED Notes (Signed)
Pt spilled pee out of urinal when leaving room.

## 2016-05-10 ENCOUNTER — Telehealth: Payer: Self-pay | Admitting: Internal Medicine

## 2016-05-10 ENCOUNTER — Encounter (HOSPITAL_COMMUNITY): Payer: Self-pay

## 2016-05-10 ENCOUNTER — Emergency Department (HOSPITAL_COMMUNITY)
Admission: EM | Admit: 2016-05-10 | Discharge: 2016-05-10 | Disposition: A | Discharge status: Home / Self Care | Payer: Medicaid Other | Attending: Emergency Medicine | Admitting: Emergency Medicine

## 2016-05-10 DIAGNOSIS — R1084 Generalized abdominal pain: Secondary | ICD-10-CM | POA: Diagnosis not present

## 2016-05-10 DIAGNOSIS — R109 Unspecified abdominal pain: Secondary | ICD-10-CM

## 2016-05-10 DIAGNOSIS — Z87891 Personal history of nicotine dependence: Secondary | ICD-10-CM | POA: Insufficient documentation

## 2016-05-10 MED ORDER — HALOPERIDOL LACTATE 5 MG/ML IJ SOLN
2.0000 mg | Freq: Once | INTRAMUSCULAR | Status: AC
  Administered 2016-05-10: 2 mg via INTRAVENOUS
  Filled 2016-05-10: qty 1

## 2016-05-10 MED ORDER — SODIUM CHLORIDE 0.9 % IV BOLUS (SEPSIS)
1000.0000 mL | Freq: Once | INTRAVENOUS | Status: AC
  Administered 2016-05-10: 1000 mL via INTRAVENOUS

## 2016-05-10 MED ORDER — KETOROLAC TROMETHAMINE 30 MG/ML IJ SOLN
30.0000 mg | Freq: Once | INTRAMUSCULAR | Status: AC
  Administered 2016-05-10: 30 mg via INTRAVENOUS
  Filled 2016-05-10: qty 1

## 2016-05-10 MED ORDER — ONDANSETRON HCL 4 MG/2ML IJ SOLN
4.0000 mg | Freq: Once | INTRAMUSCULAR | Status: AC
  Administered 2016-05-10: 4 mg via INTRAVENOUS
  Filled 2016-05-10: qty 2

## 2016-05-10 NOTE — Telephone Encounter (Signed)
Medication Refill: traMADol (ULTRAM) 50 MG tablet  Pt states he has been requesting a refill on tramadol for a few weeks.  Please follow up. Thank you

## 2016-05-10 NOTE — ED Notes (Signed)
Per EMS, pt ambulatory to truck and complained of n/v and abdominal pain.  Chronic for patient.  Pain started last night.  Vitals:  120/80, hr 78, 100%, cbg 114
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
## 2016-05-10 NOTE — ED Notes (Signed)
Sprite cranberry juice and bus pass given to patient

## 2016-05-15 NOTE — Telephone Encounter (Signed)
Patient is needing tramadol. Please follow up.

## 2016-05-17 NOTE — Telephone Encounter (Signed)
Pt. Called requesting a refill on Tramadol. Please f/u with pt.  °

## 2016-05-22 NOTE — Telephone Encounter (Signed)
Patient called requesting status of Tramadol medication refill, please f/up

## 2016-05-24 ENCOUNTER — Other Ambulatory Visit: Payer: Self-pay | Admitting: *Deleted

## 2016-05-24 DIAGNOSIS — R1031 Right lower quadrant pain: Principal | ICD-10-CM

## 2016-05-24 DIAGNOSIS — M62838 Other muscle spasm: Secondary | ICD-10-CM

## 2016-05-24 DIAGNOSIS — G8929 Other chronic pain: Secondary | ICD-10-CM

## 2016-05-24 DIAGNOSIS — K2100 Gastro-esophageal reflux disease with esophagitis, without bleeding: Secondary | ICD-10-CM

## 2016-05-24 MED ORDER — CYCLOBENZAPRINE HCL 10 MG PO TABS: 10.0000 mg | ORAL_TABLET | Freq: Three times a day (TID) | ORAL | Status: DC | PRN

## 2016-05-24 MED ORDER — DOCUSATE SODIUM 100 MG PO CAPS: 100.0000 mg | ORAL_CAPSULE | Freq: Every day | ORAL | Status: DC

## 2016-05-24 MED ORDER — ONDANSETRON HCL 4 MG PO TABS: 4.0000 mg | ORAL_TABLET | Freq: Four times a day (QID) | ORAL | Status: DC

## 2016-05-24 MED ORDER — TRAMADOL HCL 50 MG PO TABS: 50.0000 mg | ORAL_TABLET | Freq: Two times a day (BID) | ORAL | Status: DC | PRN

## 2016-05-24 MED ORDER — OMEPRAZOLE 20 MG PO CPDR: 20.0000 mg | DELAYED_RELEASE_CAPSULE | Freq: Every day | ORAL | Status: DC

## 2016-05-24 NOTE — Telephone Encounter (Signed)
Pt. Called requesting a refill on Tramadol. Please f/u °

## 2016-05-24 NOTE — Telephone Encounter (Signed)
Pain medication request was routed to PCP for approval. Other medication has been refilled.

## 2016-05-24 NOTE — Telephone Encounter (Signed)
Patient is requesting a refill on Tramadol

## 2016-05-29 ENCOUNTER — Encounter (HOSPITAL_COMMUNITY): Payer: Self-pay

## 2016-05-29 ENCOUNTER — Emergency Department (HOSPITAL_COMMUNITY)
Admission: EM | Admit: 2016-05-29 | Discharge: 2016-05-29 | Disposition: A | Discharge status: Home / Self Care | Payer: Medicaid Other | Attending: Dermatology | Admitting: Dermatology

## 2016-05-29 DIAGNOSIS — Z87891 Personal history of nicotine dependence: Secondary | ICD-10-CM | POA: Diagnosis not present

## 2016-05-29 DIAGNOSIS — N189 Chronic kidney disease, unspecified: Secondary | ICD-10-CM | POA: Insufficient documentation

## 2016-05-29 DIAGNOSIS — R109 Unspecified abdominal pain: Secondary | ICD-10-CM | POA: Insufficient documentation

## 2016-05-29 DIAGNOSIS — Z5321 Procedure and treatment not carried out due to patient leaving prior to being seen by health care provider: Secondary | ICD-10-CM | POA: Diagnosis not present

## 2016-05-29 LAB — COMPREHENSIVE METABOLIC PANEL
ALT: 24 U/L (ref 17–63)
AST: 21 U/L (ref 15–41)
Albumin: 3.6 g/dL (ref 3.5–5.0)
Alkaline Phosphatase: 63 U/L (ref 38–126)
Anion gap: 6 (ref 5–15)
BUN: <5 mg/dL — ABNORMAL LOW (ref 6–20)
CO2: 25 mmol/L (ref 22–32)
Calcium: 9.1 mg/dL (ref 8.9–10.3)
Chloride: 106 mmol/L (ref 101–111)
Creatinine, Ser: 0.94 mg/dL (ref 0.61–1.24)
GFR calc Af Amer: >60 mL/min (ref >60)
GFR calc non Af Amer: >60 mL/min (ref >60)
Glucose, Bld: 77 mg/dL (ref 65–99)
Potassium: 4.2 mmol/L (ref 3.5–5.1)
Sodium: 137 mmol/L (ref 135–145)
Total Bilirubin: 0.3 mg/dL (ref 0.3–1.2)
Total Protein: 6.6 g/dL (ref 6.5–8.1)

## 2016-05-29 LAB — CBC
HCT: 38.5 % — ABNORMAL LOW (ref 39.0–52.0)
Hemoglobin: 13.1 g/dL (ref 13.0–17.0)
MCH: 31 pg (ref 26.0–34.0)
MCHC: 34 g/dL (ref 30.0–36.0)
MCV: 91.2 fL (ref 78.0–100.0)
Platelets: 242 10*3/uL (ref 150–400)
RBC: 4.22 MIL/uL (ref 4.22–5.81)
RDW: 14.4 % (ref 11.5–15.5)
WBC: 7.4 10*3/uL (ref 4.0–10.5)

## 2016-05-29 LAB — LIPASE, BLOOD: Lipase: 29 U/L (ref 11–51)

## 2016-05-29 NOTE — ED Notes (Signed)
Patient refused vitals; patient stated "this is just taking too long. I'm not waiting"

## 2016-05-29 NOTE — ED Notes (Signed)
Per PT, Pt has had abdominal pain for the last week with inability to eat anything. Denies vomiting, but reports diarrhea.

## 2016-05-30 ENCOUNTER — Encounter (HOSPITAL_COMMUNITY): Payer: Self-pay

## 2016-05-30 ENCOUNTER — Emergency Department (HOSPITAL_COMMUNITY)
Admission: EM | Admit: 2016-05-30 | Discharge: 2016-05-30 | Disposition: A | Discharge status: Home / Self Care | Payer: Medicaid Other | Attending: Emergency Medicine | Admitting: Emergency Medicine

## 2016-05-30 DIAGNOSIS — R1013 Epigastric pain: Secondary | ICD-10-CM | POA: Insufficient documentation

## 2016-05-30 DIAGNOSIS — Z87891 Personal history of nicotine dependence: Secondary | ICD-10-CM | POA: Diagnosis not present

## 2016-05-30 DIAGNOSIS — K2100 Gastro-esophageal reflux disease with esophagitis, without bleeding: Secondary | ICD-10-CM

## 2016-05-30 DIAGNOSIS — N189 Chronic kidney disease, unspecified: Secondary | ICD-10-CM | POA: Diagnosis not present

## 2016-05-30 DIAGNOSIS — R109 Unspecified abdominal pain: Secondary | ICD-10-CM

## 2016-05-30 LAB — URINALYSIS, ROUTINE W REFLEX MICROSCOPIC
Bilirubin Urine: NEGATIVE
Glucose, UA: NEGATIVE mg/dL
Ketones, ur: 15 mg/dL — AB
Leukocytes, UA: NEGATIVE
Nitrite: NEGATIVE
Protein, ur: NEGATIVE mg/dL
Specific Gravity, Urine: 1.031 — ABNORMAL HIGH (ref 1.005–1.030)
pH: 5.5 (ref 5.0–8.0)

## 2016-05-30 LAB — URINE MICROSCOPIC-ADD ON

## 2016-05-30 MED ORDER — OMEPRAZOLE 20 MG PO CPDR: 20.0000 mg | DELAYED_RELEASE_CAPSULE | Freq: Every day | ORAL | Status: DC

## 2016-05-30 MED ORDER — DICYCLOMINE HCL 20 MG PO TABS: 20.0000 mg | ORAL_TABLET | Freq: Two times a day (BID) | ORAL | Status: DC

## 2016-05-30 MED ORDER — KETOROLAC TROMETHAMINE 60 MG/2ML IM SOLN
60.0000 mg | Freq: Once | INTRAMUSCULAR | Status: AC
  Administered 2016-05-30: 60 mg via INTRAMUSCULAR
  Filled 2016-05-30: qty 2

## 2016-05-30 MED ORDER — ONDANSETRON 4 MG PO TBDP
4.0000 mg | ORAL_TABLET | Freq: Once | ORAL | Status: AC
  Administered 2016-05-30: 4 mg via ORAL
  Filled 2016-05-30: qty 1

## 2016-05-30 MED FILL — traMADol HCL 50 MG TABS: 50 | 45 days supply | Qty: 90 | Fill #0

## 2016-05-30 NOTE — ED Notes (Signed)
C/o abd. Pain onset 6/18,  No change in pain states  he came yest however it was too busy so he left . Denies n/v . States he goes to the pain clinic and he is 2 months behind on his pain medications

## 2016-05-30 NOTE — ED Notes (Signed)
discharged with bus pass

## 2016-05-30 NOTE — Discharge Instructions (Signed)
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
## 2016-05-31 LAB — URINE CULTURE: Culture: NO GROWTH

## 2016-06-10 IMAGING — US US ART/VEN ABD/PELV/SCROTUM DOPPLER LTD
1 series · 13 of 25 positions shown · non-contrast
Comparison: None.

CLINICAL DATA: Right scrotal pain for 3 days.

EXAM:
SCROTAL ULTRASOUND
DOPPLER ULTRASOUND OF THE TESTICLES
TECHNIQUE: Complete ultrasound examination of the testicles, epididymis, and
other scrotal structures was performed. Color and spectral Doppler
ultrasound were also utilized to evaluate blood flow to the
testicles.

[Series 1: us art/ven abd/pelv/scrotum doppler ltd · 0.06mm/px · 55 acquisitions, 13 frames shown]
[im 1/55]
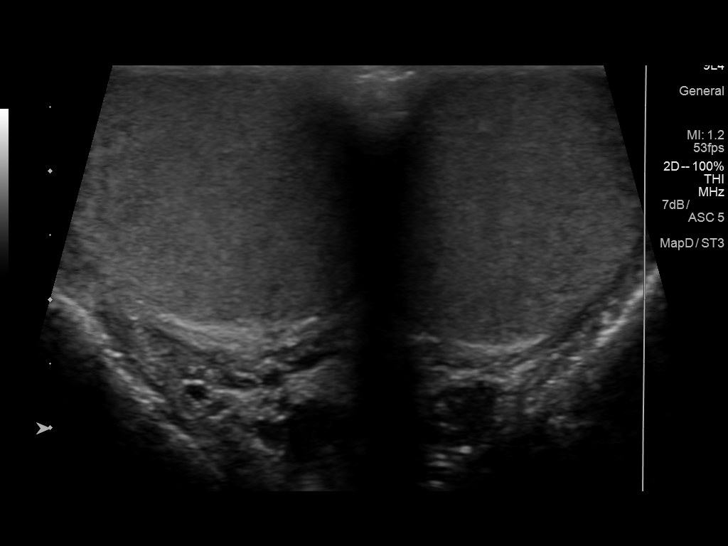
[im 5/55]
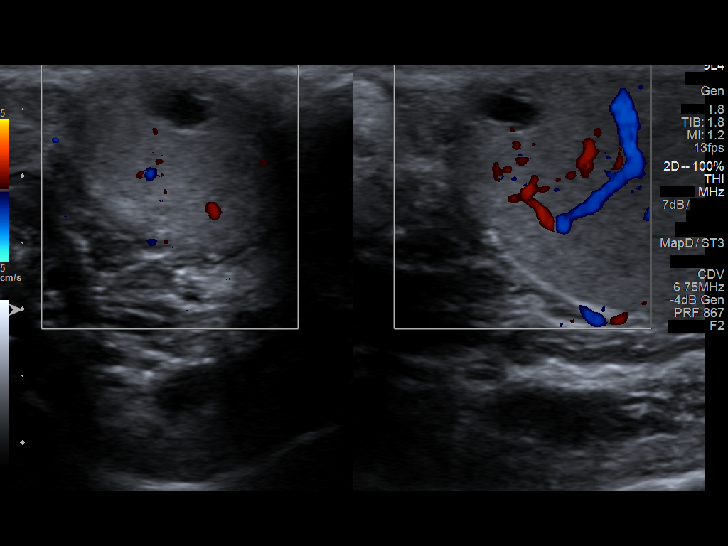
[im 10/55]
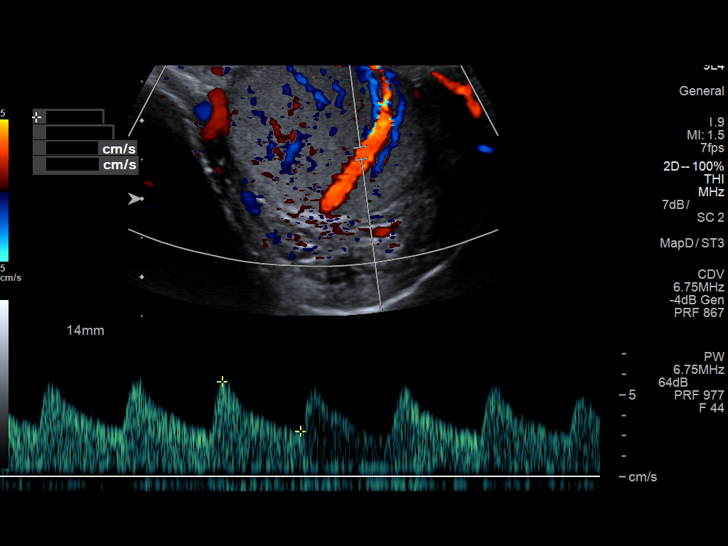
[im 14/55]
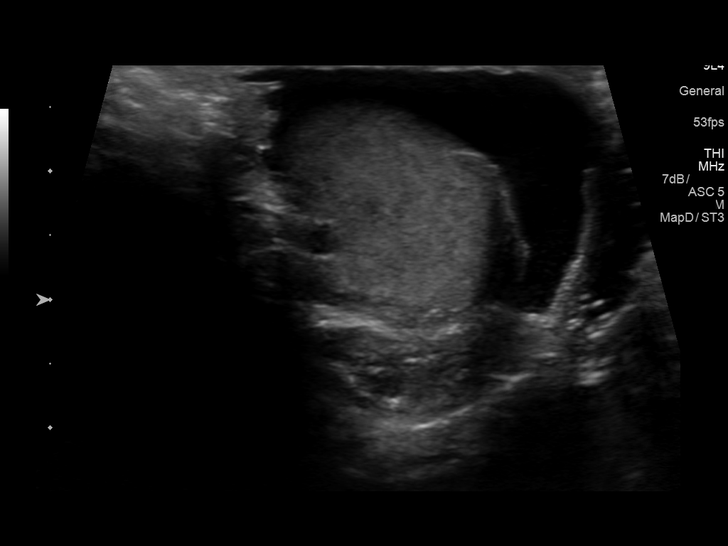
[im 19/55]
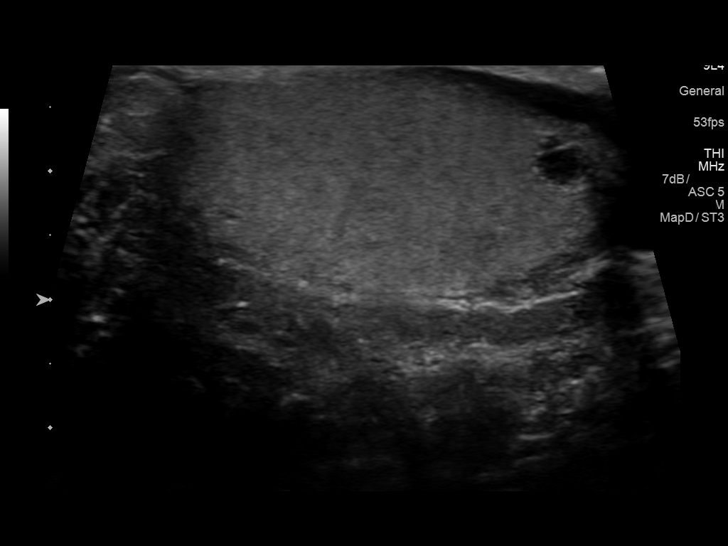
[im 23/55]
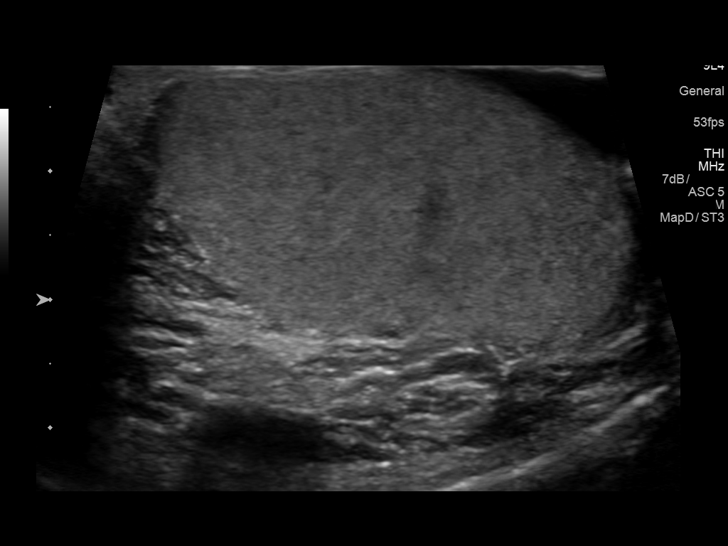
[im 28/55]
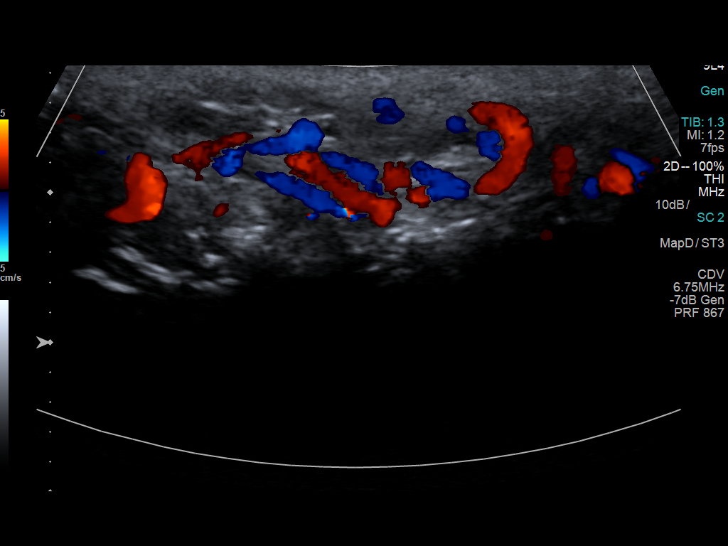
[im 32/55]
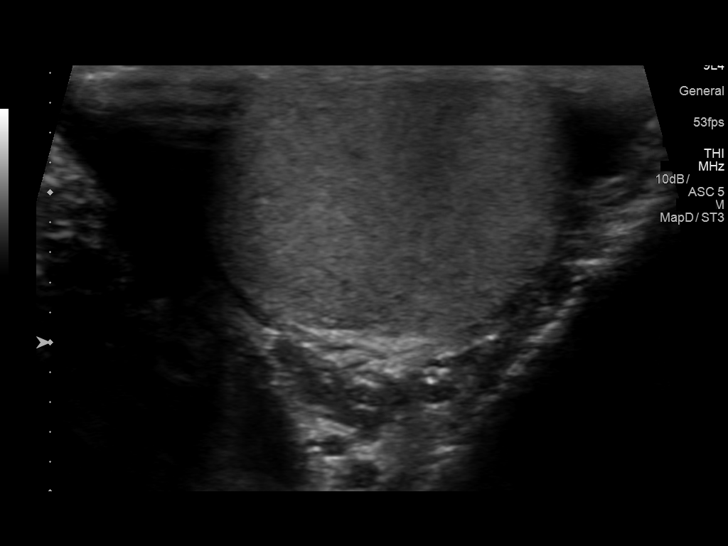
[im 37/55]
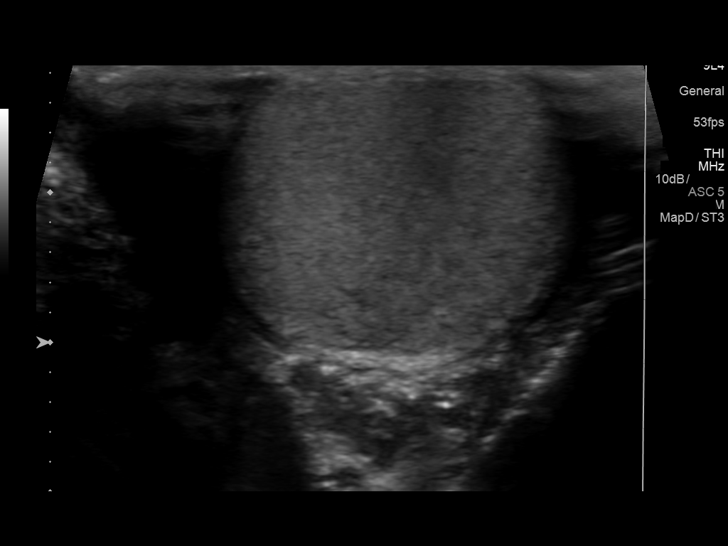
[im 41/55]
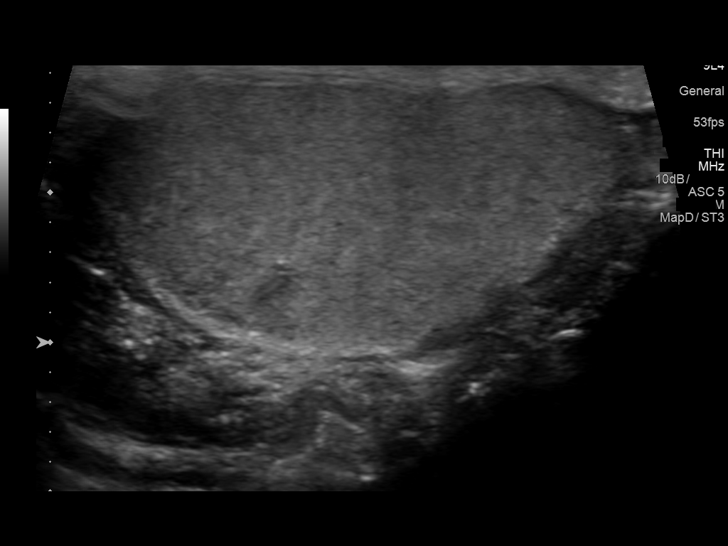
[im 46/55]
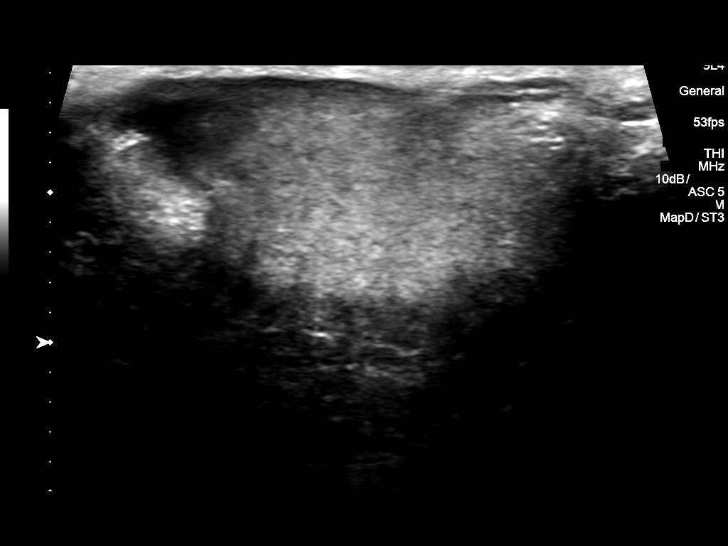
[im 50/55]
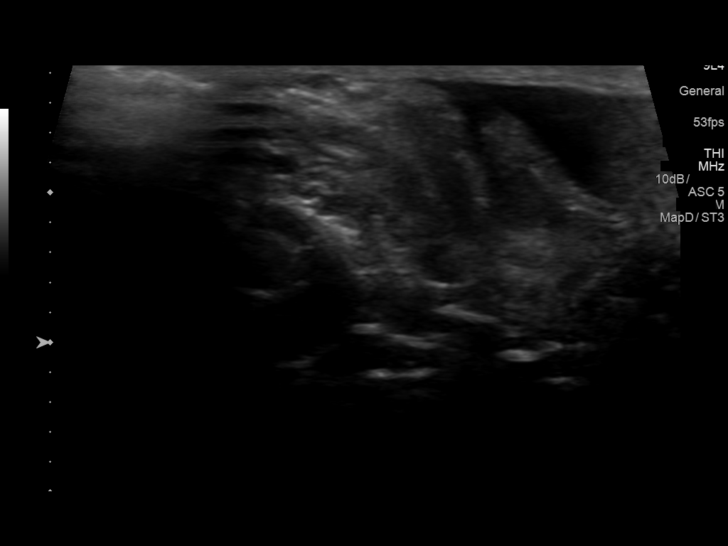
[im 55/55]
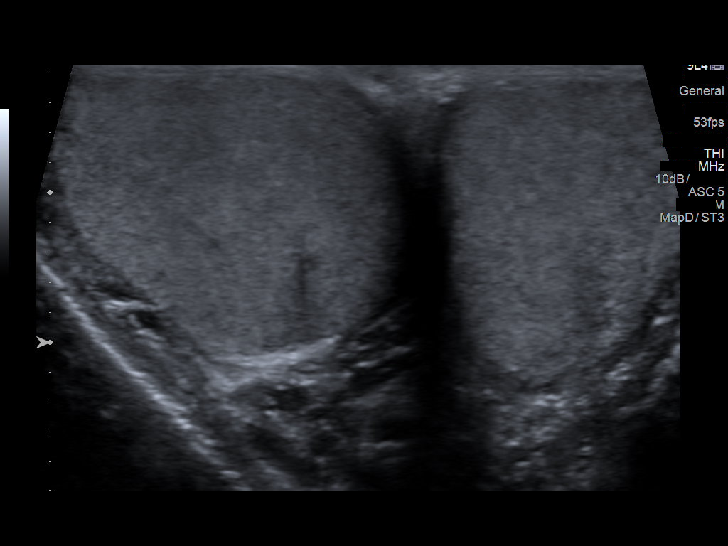

[13 of 25 positions shown; findings below may reference images not displayed]

FINDINGS: Right testicle

Measurements: 4.0 x 1.9 x 2.5 cm. No solid mass. No microlithiasis.
There are two tiny benign-appearing cysts scattered within the
periphery of the right testis, including a mildly lobulated simple
appearing cyst measuring 0.5 x 0.3 x 0.4 cm in the superior right
testis and a minimally complex 0.4 x 0.3 x 0.4 cm cysts with thin
internal septation in the lower right testis.

Left testicle

Measurements: 3.7 x 1.9 x 2.3 cm. No mass or microlithiasis
visualized.

Right epididymis:  Normal in size and appearance.

Left epididymis:  Normal in size and appearance.

Hydrocele:  None visualized.

Varicocele:  None visualized.

Pulsed Doppler interrogation of both testes demonstrates normal low
resistance arterial and venous waveforms bilaterally.
IMPRESSION: 1. No evidence of testicular torsion. No evidence of acute
epididymitis.
2. Two tiny benign-appearing cysts within the periphery of the right
testis, largest 0.5 cm in the superior right testis. Recommend
follow-up scrotal sonogram in 3-6 months to demonstrate stability of
these cysts.

## 2016-06-21 ENCOUNTER — Telehealth: Payer: Self-pay | Admitting: Internal Medicine

## 2016-06-21 NOTE — Telephone Encounter (Signed)
Pt. Came into facility requesting a refill on Tramadol and Zofran. Please f/u

## 2016-06-22 ENCOUNTER — Emergency Department (HOSPITAL_COMMUNITY)
Admission: EM | Admit: 2016-06-22 | Discharge: 2016-06-23 | Disposition: A | Discharge status: Home / Self Care | Payer: Medicare Other | Attending: Emergency Medicine | Admitting: Emergency Medicine

## 2016-06-22 DIAGNOSIS — G8929 Other chronic pain: Secondary | ICD-10-CM | POA: Insufficient documentation

## 2016-06-22 DIAGNOSIS — F1290 Cannabis use, unspecified, uncomplicated: Secondary | ICD-10-CM | POA: Insufficient documentation

## 2016-06-22 DIAGNOSIS — N189 Chronic kidney disease, unspecified: Secondary | ICD-10-CM | POA: Insufficient documentation

## 2016-06-22 DIAGNOSIS — R42 Dizziness and giddiness: Secondary | ICD-10-CM | POA: Insufficient documentation

## 2016-06-22 DIAGNOSIS — R197 Diarrhea, unspecified: Secondary | ICD-10-CM | POA: Diagnosis not present

## 2016-06-22 DIAGNOSIS — R1031 Right lower quadrant pain: Secondary | ICD-10-CM | POA: Insufficient documentation

## 2016-06-22 DIAGNOSIS — R112 Nausea with vomiting, unspecified: Secondary | ICD-10-CM | POA: Insufficient documentation

## 2016-06-22 DIAGNOSIS — R109 Unspecified abdominal pain: Secondary | ICD-10-CM

## 2016-06-22 DIAGNOSIS — Z87891 Personal history of nicotine dependence: Secondary | ICD-10-CM | POA: Insufficient documentation

## 2016-06-22 LAB — CBC WITH DIFFERENTIAL/PLATELET
Basophils Absolute: 0 10*3/uL (ref 0.0–0.1)
Basophils Relative: 0 %
Eosinophils Absolute: 0.1 10*3/uL (ref 0.0–0.7)
Eosinophils Relative: 1 %
HCT: 44.9 % (ref 39.0–52.0)
Hemoglobin: 15.8 g/dL (ref 13.0–17.0)
Lymphocytes Relative: 27 %
Lymphs Abs: 2.7 10*3/uL (ref 0.7–4.0)
MCH: 31.5 pg (ref 26.0–34.0)
MCHC: 35.2 g/dL (ref 30.0–36.0)
MCV: 89.4 fL (ref 78.0–100.0)
Monocytes Absolute: 0.8 10*3/uL (ref 0.1–1.0)
Monocytes Relative: 8 %
Neutro Abs: 6.6 10*3/uL (ref 1.7–7.7)
Neutrophils Relative %: 64 %
Platelets: 264 10*3/uL (ref 150–400)
RBC: 5.02 MIL/uL (ref 4.22–5.81)
RDW: 14.3 % (ref 11.5–15.5)
WBC: 10.2 10*3/uL (ref 4.0–10.5)

## 2016-06-22 LAB — I-STAT CHEM 8, ED
BUN: 16 mg/dL (ref 6–20)
Calcium, Ion: 1.16 mmol/L (ref 1.13–1.30)
Chloride: 104 mmol/L (ref 101–111)
Creatinine, Ser: 1.3 mg/dL — ABNORMAL HIGH (ref 0.61–1.24)
Glucose, Bld: 115 mg/dL — ABNORMAL HIGH (ref 65–99)
HCT: 49 % (ref 39.0–52.0)
Hemoglobin: 16.7 g/dL (ref 13.0–17.0)
Potassium: 3.8 mmol/L (ref 3.5–5.1)
Sodium: 142 mmol/L (ref 135–145)
TCO2: 25 mmol/L (ref 0–100)

## 2016-06-22 MED ORDER — ONDANSETRON 8 MG PO TBDP
8.0000 mg | ORAL_TABLET | Freq: Once | ORAL | Status: AC
  Administered 2016-06-22: 8 mg via ORAL
  Filled 2016-06-22: qty 1

## 2016-06-22 MED ORDER — KETOROLAC TROMETHAMINE 30 MG/ML IJ SOLN
30.0000 mg | Freq: Once | INTRAMUSCULAR | Status: AC
  Administered 2016-06-22: 30 mg via INTRAMUSCULAR
  Filled 2016-06-22: qty 1
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
## 2016-06-23 ENCOUNTER — Other Ambulatory Visit: Payer: Self-pay | Admitting: *Deleted

## 2016-06-23 DIAGNOSIS — R1031 Right lower quadrant pain: Principal | ICD-10-CM

## 2016-06-23 DIAGNOSIS — G8929 Other chronic pain: Secondary | ICD-10-CM

## 2016-06-23 LAB — URINALYSIS, ROUTINE W REFLEX MICROSCOPIC
Glucose, UA: NEGATIVE mg/dL
Hgb urine dipstick: NEGATIVE
Ketones, ur: NEGATIVE mg/dL
Nitrite: POSITIVE — AB
Protein, ur: 30 mg/dL — AB
Specific Gravity, Urine: 1.04 — ABNORMAL HIGH (ref 1.005–1.030)
pH: 5.5 (ref 5.0–8.0)

## 2016-06-23 LAB — URINE MICROSCOPIC-ADD ON

## 2016-06-23 MED ORDER — TRAMADOL HCL 50 MG PO TABS: 50.0000 mg | ORAL_TABLET | Freq: Two times a day (BID) | ORAL | Status: DC | PRN

## 2016-06-23 MED ORDER — ONDANSETRON HCL 4 MG PO TABS: 4.0000 mg | ORAL_TABLET | Freq: Four times a day (QID) | ORAL | Status: DC

## 2016-06-23 NOTE — Discharge Instructions (Signed)

## 2016-06-23 NOTE — Telephone Encounter (Signed)
Routed to PCP for approval or denial 

## 2016-06-24 LAB — URINE CULTURE: Culture: <10000 — AB

## 2016-06-26 ENCOUNTER — Emergency Department (HOSPITAL_COMMUNITY)
Admission: EM | Admit: 2016-06-26 | Discharge: 2016-06-26 | Disposition: A | Discharge status: Home / Self Care | Payer: Medicare Other | Attending: Emergency Medicine | Admitting: Emergency Medicine

## 2016-06-26 ENCOUNTER — Encounter (HOSPITAL_COMMUNITY): Payer: Self-pay | Admitting: Emergency Medicine

## 2016-06-26 ENCOUNTER — Emergency Department (HOSPITAL_COMMUNITY): Payer: Medicare Other

## 2016-06-26 DIAGNOSIS — Z87891 Personal history of nicotine dependence: Secondary | ICD-10-CM | POA: Insufficient documentation

## 2016-06-26 DIAGNOSIS — N189 Chronic kidney disease, unspecified: Secondary | ICD-10-CM | POA: Diagnosis not present

## 2016-06-26 DIAGNOSIS — R1084 Generalized abdominal pain: Secondary | ICD-10-CM | POA: Diagnosis not present

## 2016-06-26 DIAGNOSIS — R1011 Right upper quadrant pain: Secondary | ICD-10-CM | POA: Diagnosis present

## 2016-06-26 DIAGNOSIS — Z79899 Other long term (current) drug therapy: Secondary | ICD-10-CM | POA: Insufficient documentation

## 2016-06-26 LAB — COMPREHENSIVE METABOLIC PANEL
ALT: 20 U/L (ref 17–63)
AST: 34 U/L (ref 15–41)
Albumin: 4.9 g/dL (ref 3.5–5.0)
Alkaline Phosphatase: 70 U/L (ref 38–126)
Anion gap: 11 (ref 5–15)
BUN: 16 mg/dL (ref 6–20)
CO2: 25 mmol/L (ref 22–32)
Calcium: 10.3 mg/dL (ref 8.9–10.3)
Chloride: 99 mmol/L — ABNORMAL LOW (ref 101–111)
Creatinine, Ser: 1.27 mg/dL — ABNORMAL HIGH (ref 0.61–1.24)
GFR calc Af Amer: >60 mL/min (ref >60)
GFR calc non Af Amer: >60 mL/min (ref >60)
Glucose, Bld: 105 mg/dL — ABNORMAL HIGH (ref 65–99)
Potassium: 4 mmol/L (ref 3.5–5.1)
Sodium: 135 mmol/L (ref 135–145)
Total Bilirubin: 1.2 mg/dL (ref 0.3–1.2)
Total Protein: 9 g/dL — ABNORMAL HIGH (ref 6.5–8.1)

## 2016-06-26 LAB — URINALYSIS, ROUTINE W REFLEX MICROSCOPIC
Glucose, UA: NEGATIVE mg/dL
Hgb urine dipstick: NEGATIVE
Ketones, ur: NEGATIVE mg/dL
Nitrite: NEGATIVE
Protein, ur: 30 mg/dL — AB
Specific Gravity, Urine: 1.034 — ABNORMAL HIGH (ref 1.005–1.030)
pH: 5.5 (ref 5.0–8.0)

## 2016-06-26 LAB — URINE MICROSCOPIC-ADD ON

## 2016-06-26 LAB — RAPID URINE DRUG SCREEN, HOSP PERFORMED
Amphetamines: NOT DETECTED
Barbiturates: NOT DETECTED
Benzodiazepines: NOT DETECTED
Cocaine: NOT DETECTED
Opiates: NOT DETECTED
Tetrahydrocannabinol: POSITIVE — AB

## 2016-06-26 LAB — CBC
HCT: 45.7 % (ref 39.0–52.0)
Hemoglobin: 16 g/dL (ref 13.0–17.0)
MCH: 31.3 pg (ref 26.0–34.0)
MCHC: 35 g/dL (ref 30.0–36.0)
MCV: 89.4 fL (ref 78.0–100.0)
Platelets: 241 10*3/uL (ref 150–400)
RBC: 5.11 MIL/uL (ref 4.22–5.81)
RDW: 13.3 % (ref 11.5–15.5)
WBC: 9.5 10*3/uL (ref 4.0–10.5)

## 2016-06-26 MED ORDER — DICYCLOMINE HCL 20 MG PO TABS: 20.0000 mg | ORAL_TABLET | Freq: Two times a day (BID) | ORAL | 0 refills | Status: DC

## 2016-06-26 MED ORDER — SODIUM CHLORIDE 0.9 % IV BOLUS (SEPSIS)
1000.0000 mL | Freq: Once | INTRAVENOUS | Status: AC
  Administered 2016-06-26: 1000 mL via INTRAVENOUS

## 2016-06-26 MED ORDER — HALOPERIDOL LACTATE 5 MG/ML IJ SOLN
5.0000 mg | Freq: Once | INTRAMUSCULAR | Status: AC
  Administered 2016-06-26: 5 mg via INTRAMUSCULAR

## 2016-06-26 MED ORDER — DICYCLOMINE HCL 10 MG PO CAPS
10.0000 mg | ORAL_CAPSULE | Freq: Once | ORAL | Status: AC
  Administered 2016-06-26: 10 mg via ORAL
  Filled 2016-06-26: qty 1

## 2016-06-26 MED ORDER — HALOPERIDOL LACTATE 5 MG/ML IJ SOLN
5.0000 mg | Freq: Once | INTRAMUSCULAR | Status: DC
  Filled 2016-06-26: qty 1

## 2016-06-26 MED ORDER — LORAZEPAM 2 MG/ML IJ SOLN
1.0000 mg | Freq: Once | INTRAMUSCULAR | Status: AC
  Administered 2016-06-26: 1 mg via INTRAVENOUS
  Filled 2016-06-26: qty 1

## 2016-06-26 MED ORDER — FENTANYL CITRATE (PF) 100 MCG/2ML IJ SOLN
50.0000 ug | Freq: Once | INTRAMUSCULAR | Status: AC
  Administered 2016-06-26: 50 ug via INTRAVENOUS
  Filled 2016-06-26: qty 2

## 2016-06-26 MED ORDER — ONDANSETRON 4 MG PO TBDP: 4.0000 mg | ORAL_TABLET | Freq: Three times a day (TID) | ORAL | 0 refills | Status: DC | PRN

## 2016-06-26 MED ORDER — ONDANSETRON HCL 4 MG/2ML IJ SOLN
4.0000 mg | Freq: Once | INTRAMUSCULAR | Status: AC
  Administered 2016-06-26: 4 mg via INTRAVENOUS
  Filled 2016-06-26: qty 2
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
## 2016-06-26 NOTE — ED Notes (Addendum)
PT made aware he cannot drive or operate machinery for the rest of the day. IV discontinued, local dressing applied

## 2016-06-26 NOTE — ED Notes (Signed)
Pt given urinal for urine sample 

## 2016-06-26 NOTE — ED Triage Notes (Signed)
Pt. Has a care plan
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
## 2016-06-27 NOTE — Telephone Encounter (Signed)
Pt. Came into facility to check on the status on his Tramadol and Zofran. It appears that it was refilled on 06/23/16 but the Rx are not in the front desk. Please f/u

## 2016-06-27 NOTE — Telephone Encounter (Signed)
Prescription needed to be signed by PCP which today it has. Prescription will be placed at the front desk for pickup in the morning.

## 2016-07-04 ENCOUNTER — Telehealth: Payer: Self-pay

## 2016-07-04 NOTE — Telephone Encounter (Signed)
Clld pt - LMOVM that Rxs for  Tramadol and Zofran are upfront/ready for pick up.  If patient should return call, please advise of the above.

## 2016-07-07 ENCOUNTER — Encounter (HOSPITAL_COMMUNITY): Payer: Self-pay | Admitting: Emergency Medicine

## 2016-07-07 ENCOUNTER — Emergency Department (HOSPITAL_COMMUNITY)
Admission: EM | Admit: 2016-07-07 | Discharge: 2016-07-07 | Disposition: A | Discharge status: Home / Self Care | Payer: Medicare Other | Attending: Emergency Medicine | Admitting: Emergency Medicine

## 2016-07-07 DIAGNOSIS — Z87891 Personal history of nicotine dependence: Secondary | ICD-10-CM | POA: Diagnosis not present

## 2016-07-07 DIAGNOSIS — N189 Chronic kidney disease, unspecified: Secondary | ICD-10-CM | POA: Diagnosis not present

## 2016-07-07 DIAGNOSIS — R1031 Right lower quadrant pain: Secondary | ICD-10-CM | POA: Diagnosis present

## 2016-07-07 DIAGNOSIS — G8929 Other chronic pain: Secondary | ICD-10-CM | POA: Diagnosis not present

## 2016-07-07 DIAGNOSIS — R109 Unspecified abdominal pain: Secondary | ICD-10-CM | POA: Diagnosis not present

## 2016-07-07 LAB — LIPASE, BLOOD: Lipase: 25 U/L (ref 11–51)

## 2016-07-07 LAB — COMPREHENSIVE METABOLIC PANEL
ALT: 17 U/L (ref 17–63)
AST: 22 U/L (ref 15–41)
Albumin: 4 g/dL (ref 3.5–5.0)
Alkaline Phosphatase: 55 U/L (ref 38–126)
Anion gap: 11 (ref 5–15)
BUN: 7 mg/dL (ref 6–20)
CO2: 24 mmol/L (ref 22–32)
Calcium: 9.2 mg/dL (ref 8.9–10.3)
Chloride: 101 mmol/L (ref 101–111)
Creatinine, Ser: 1.07 mg/dL (ref 0.61–1.24)
GFR calc Af Amer: >60 mL/min (ref >60)
GFR calc non Af Amer: >60 mL/min (ref >60)
Glucose, Bld: 103 mg/dL — ABNORMAL HIGH (ref 65–99)
Potassium: 3.5 mmol/L (ref 3.5–5.1)
Sodium: 136 mmol/L (ref 135–145)
Total Bilirubin: 0.5 mg/dL (ref 0.3–1.2)
Total Protein: 7 g/dL (ref 6.5–8.1)

## 2016-07-07 LAB — CBC WITH DIFFERENTIAL/PLATELET
Basophils Absolute: 0 10*3/uL (ref 0.0–0.1)
Basophils Relative: 0 %
Eosinophils Absolute: 0.1 10*3/uL (ref 0.0–0.7)
Eosinophils Relative: 1 %
HCT: 39.9 % (ref 39.0–52.0)
Hemoglobin: 13.6 g/dL (ref 13.0–17.0)
Lymphocytes Relative: 43 %
Lymphs Abs: 3.2 10*3/uL (ref 0.7–4.0)
MCH: 31.2 pg (ref 26.0–34.0)
MCHC: 34.1 g/dL (ref 30.0–36.0)
MCV: 91.5 fL (ref 78.0–100.0)
Monocytes Absolute: 0.4 10*3/uL (ref 0.1–1.0)
Monocytes Relative: 6 %
Neutro Abs: 3.8 10*3/uL (ref 1.7–7.7)
Neutrophils Relative %: 50 %
Platelets: 240 10*3/uL (ref 150–400)
RBC: 4.36 MIL/uL (ref 4.22–5.81)
RDW: 13.5 % (ref 11.5–15.5)
WBC: 7.5 10*3/uL (ref 4.0–10.5)

## 2016-07-07 MED FILL — traMADol HCL 50 MG TABS: 50 | 45 days supply | Qty: 90 | Fill #0

## 2016-07-07 MED FILL — ?CYCLOBENZAPRINE 10 MG TABL: 10 | 20 days supply | Qty: 60 | Fill #0

## 2016-07-07 MED FILL — ?ONDANSETRON HCL 4 MG TABLE: 4 | 3 days supply | Qty: 12 | Fill #0

## 2016-07-07 MED FILL — ?OMEPRAZOLE DR 20 MG CAPSUL: 20 | 30 days supply | Qty: 30 | Fill #0

## 2016-07-07 NOTE — ED Notes (Signed)
Phlebotomy at bedside.

## 2016-07-07 NOTE — ED Triage Notes (Signed)
Patient c/o of RLQ pain ongoing x 3 weeks.  Patient states the pain is in the RLQ.  Denies n/v/d.  Pain 7/10.
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
## 2016-07-26 ENCOUNTER — Telehealth: Payer: Self-pay | Admitting: Internal Medicine

## 2016-07-26 NOTE — Telephone Encounter (Signed)
Pt came to the office to speak with nurse regarding  Medication. Pt needs refills for traMADol (ULTRAM) 50 MG tablet and ondansetron (ZOFRAN) 4 MG tablet . Please follow up.   Thank you

## 2016-07-31 ENCOUNTER — Encounter (HOSPITAL_COMMUNITY): Payer: Self-pay | Admitting: Emergency Medicine

## 2016-07-31 ENCOUNTER — Emergency Department (HOSPITAL_COMMUNITY)
Admission: EM | Admit: 2016-07-31 | Discharge: 2016-07-31 | Disposition: A | Discharge status: Home / Self Care | Payer: Medicare Other | Attending: Emergency Medicine | Admitting: Emergency Medicine

## 2016-07-31 ENCOUNTER — Telehealth: Payer: Self-pay | Admitting: Internal Medicine

## 2016-07-31 DIAGNOSIS — R1031 Right lower quadrant pain: Secondary | ICD-10-CM

## 2016-07-31 DIAGNOSIS — Z87891 Personal history of nicotine dependence: Secondary | ICD-10-CM | POA: Diagnosis not present

## 2016-07-31 DIAGNOSIS — N189 Chronic kidney disease, unspecified: Secondary | ICD-10-CM | POA: Insufficient documentation

## 2016-07-31 DIAGNOSIS — R112 Nausea with vomiting, unspecified: Secondary | ICD-10-CM | POA: Insufficient documentation

## 2016-07-31 DIAGNOSIS — R197 Diarrhea, unspecified: Secondary | ICD-10-CM | POA: Insufficient documentation

## 2016-07-31 LAB — URINE MICROSCOPIC-ADD ON

## 2016-07-31 LAB — COMPREHENSIVE METABOLIC PANEL
ALT: 23 U/L (ref 17–63)
AST: 28 U/L (ref 15–41)
Albumin: 4.7 g/dL (ref 3.5–5.0)
Alkaline Phosphatase: 70 U/L (ref 38–126)
Anion gap: 10 (ref 5–15)
BUN: 8 mg/dL (ref 6–20)
CO2: 26 mmol/L (ref 22–32)
Calcium: 10.1 mg/dL (ref 8.9–10.3)
Chloride: 102 mmol/L (ref 101–111)
Creatinine, Ser: 1.22 mg/dL (ref 0.61–1.24)
GFR calc Af Amer: >60 mL/min (ref >60)
GFR calc non Af Amer: >60 mL/min (ref >60)
Glucose, Bld: 106 mg/dL — ABNORMAL HIGH (ref 65–99)
Potassium: 3.4 mmol/L — ABNORMAL LOW (ref 3.5–5.1)
Sodium: 138 mmol/L (ref 135–145)
Total Bilirubin: 1 mg/dL (ref 0.3–1.2)
Total Protein: 8.6 g/dL — ABNORMAL HIGH (ref 6.5–8.1)

## 2016-07-31 LAB — URINALYSIS, ROUTINE W REFLEX MICROSCOPIC
Glucose, UA: NEGATIVE mg/dL
Hgb urine dipstick: NEGATIVE
Ketones, ur: 15 mg/dL — AB
Nitrite: POSITIVE — AB
Protein, ur: 100 mg/dL — AB
Specific Gravity, Urine: 1.044 — ABNORMAL HIGH (ref 1.005–1.030)
pH: 5 (ref 5.0–8.0)

## 2016-07-31 LAB — CBC
HCT: 46.4 % (ref 39.0–52.0)
Hemoglobin: 15.6 g/dL (ref 13.0–17.0)
MCH: 31.4 pg (ref 26.0–34.0)
MCHC: 33.6 g/dL (ref 30.0–36.0)
MCV: 93.4 fL (ref 78.0–100.0)
Platelets: 249 10*3/uL (ref 150–400)
RBC: 4.97 MIL/uL (ref 4.22–5.81)
RDW: 13.8 % (ref 11.5–15.5)
WBC: 8.2 10*3/uL (ref 4.0–10.5)

## 2016-07-31 LAB — LIPASE, BLOOD: Lipase: 21 U/L (ref 11–51)

## 2016-07-31 MED ORDER — ONDANSETRON 4 MG PO TBDP
4.0000 mg | ORAL_TABLET | Freq: Once | ORAL | Status: AC
  Administered 2016-07-31: 4 mg via ORAL
  Filled 2016-07-31: qty 1

## 2016-07-31 MED ORDER — KETOROLAC TROMETHAMINE 30 MG/ML IJ SOLN
30.0000 mg | Freq: Once | INTRAMUSCULAR | Status: AC
  Administered 2016-07-31: 30 mg via INTRAMUSCULAR
  Filled 2016-07-31: qty 1

## 2016-07-31 MED ORDER — ONDANSETRON HCL 4 MG PO TABS: 4.0000 mg | ORAL_TABLET | Freq: Three times a day (TID) | ORAL | 0 refills | Status: DC | PRN

## 2016-07-31 NOTE — Telephone Encounter (Signed)
Patient came in and is requesting tramadol and zofran. Patient is wanting Zofran that goes under the tongue

## 2016-07-31 NOTE — ED Notes (Signed)
Pt moving around on bed

## 2016-07-31 NOTE — Discharge Instructions (Signed)

## 2016-07-31 NOTE — ED Notes (Signed)
Pt resting much more comfortably. States he understands instructions. Home stable with steady gfait.
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
## 2016-07-31 NOTE — ED Notes (Signed)
MD at bedside. 

## 2016-07-31 NOTE — ED Triage Notes (Signed)
Pt c/o chronic RLQ abdominal pain.

## 2016-08-01 ENCOUNTER — Telehealth: Payer: Self-pay | Admitting: Internal Medicine

## 2016-08-01 ENCOUNTER — Other Ambulatory Visit: Payer: Self-pay | Admitting: Internal Medicine

## 2016-08-01 DIAGNOSIS — G8929 Other chronic pain: Secondary | ICD-10-CM

## 2016-08-01 DIAGNOSIS — R1031 Right lower quadrant pain: Principal | ICD-10-CM

## 2016-08-01 MED ORDER — ONDANSETRON 4 MG PO TBDP: 4.0000 mg | ORAL_TABLET | Freq: Three times a day (TID) | ORAL | 0 refills | Status: DC | PRN

## 2016-08-01 MED ORDER — TRAMADOL HCL 50 MG PO TABS: 50.0000 mg | ORAL_TABLET | Freq: Two times a day (BID) | ORAL | 0 refills | Status: DC | PRN

## 2016-08-01 NOTE — Telephone Encounter (Signed)
Pt called regarding his medication that he requested last week. Pt needs refills for  traMADol (ULTRAM) 50 MG tablet and ondansetron (ZOFRAN) 4 MG tablet. Please follow up.   Thank you.

## 2016-08-03 ENCOUNTER — Encounter (HOSPITAL_COMMUNITY): Payer: Self-pay

## 2016-08-03 ENCOUNTER — Emergency Department (HOSPITAL_COMMUNITY)
Admission: EM | Admit: 2016-08-03 | Discharge: 2016-08-03 | Disposition: A | Discharge status: Home / Self Care | Payer: Medicare Other | Attending: Emergency Medicine | Admitting: Emergency Medicine

## 2016-08-03 DIAGNOSIS — Z87891 Personal history of nicotine dependence: Secondary | ICD-10-CM | POA: Diagnosis not present

## 2016-08-03 DIAGNOSIS — Z79899 Other long term (current) drug therapy: Secondary | ICD-10-CM | POA: Insufficient documentation

## 2016-08-03 DIAGNOSIS — R1031 Right lower quadrant pain: Secondary | ICD-10-CM | POA: Diagnosis present

## 2016-08-03 DIAGNOSIS — K219 Gastro-esophageal reflux disease without esophagitis: Secondary | ICD-10-CM | POA: Diagnosis not present

## 2016-08-03 DIAGNOSIS — N189 Chronic kidney disease, unspecified: Secondary | ICD-10-CM | POA: Diagnosis not present

## 2016-08-03 DIAGNOSIS — R1084 Generalized abdominal pain: Secondary | ICD-10-CM

## 2016-08-03 LAB — COMPREHENSIVE METABOLIC PANEL
ALT: 22 U/L (ref 17–63)
AST: 24 U/L (ref 15–41)
Albumin: 4.4 g/dL (ref 3.5–5.0)
Alkaline Phosphatase: 64 U/L (ref 38–126)
Anion gap: 8 (ref 5–15)
BUN: 9 mg/dL (ref 6–20)
CO2: 26 mmol/L (ref 22–32)
Calcium: 9.7 mg/dL (ref 8.9–10.3)
Chloride: 105 mmol/L (ref 101–111)
Creatinine, Ser: 1.03 mg/dL (ref 0.61–1.24)
GFR calc Af Amer: >60 mL/min (ref >60)
GFR calc non Af Amer: >60 mL/min (ref >60)
Glucose, Bld: 104 mg/dL — ABNORMAL HIGH (ref 65–99)
Potassium: 4.3 mmol/L (ref 3.5–5.1)
Sodium: 139 mmol/L (ref 135–145)
Total Bilirubin: 0.8 mg/dL (ref 0.3–1.2)
Total Protein: 8 g/dL (ref 6.5–8.1)

## 2016-08-03 LAB — CBC WITH DIFFERENTIAL/PLATELET
Basophils Absolute: 0 10*3/uL (ref 0.0–0.1)
Basophils Relative: 0 %
Eosinophils Absolute: 0.1 10*3/uL (ref 0.0–0.7)
Eosinophils Relative: 1 %
HCT: 44.9 % (ref 39.0–52.0)
Hemoglobin: 14.9 g/dL (ref 13.0–17.0)
Lymphocytes Relative: 38 %
Lymphs Abs: 2.8 10*3/uL (ref 0.7–4.0)
MCH: 30.9 pg (ref 26.0–34.0)
MCHC: 33.2 g/dL (ref 30.0–36.0)
MCV: 93.2 fL (ref 78.0–100.0)
Monocytes Absolute: 0.5 10*3/uL (ref 0.1–1.0)
Monocytes Relative: 7 %
Neutro Abs: 4 10*3/uL (ref 1.7–7.7)
Neutrophils Relative %: 54 %
Platelets: 244 10*3/uL (ref 150–400)
RBC: 4.82 MIL/uL (ref 4.22–5.81)
RDW: 13.4 % (ref 11.5–15.5)
WBC: 7.4 10*3/uL (ref 4.0–10.5)

## 2016-08-03 LAB — URINALYSIS, ROUTINE W REFLEX MICROSCOPIC
Glucose, UA: NEGATIVE mg/dL
Hgb urine dipstick: NEGATIVE
Ketones, ur: 15 mg/dL — AB
Leukocytes, UA: NEGATIVE
Nitrite: NEGATIVE
Protein, ur: NEGATIVE mg/dL
Specific Gravity, Urine: 1.036 — ABNORMAL HIGH (ref 1.005–1.030)
pH: 5.5 (ref 5.0–8.0)

## 2016-08-03 LAB — LIPASE, BLOOD: Lipase: 26 U/L (ref 11–51)

## 2016-08-03 MED ORDER — RANITIDINE HCL 150 MG PO CAPS: 150.0000 mg | ORAL_CAPSULE | Freq: Every day | ORAL | 0 refills | Status: DC

## 2016-08-03 MED ORDER — GI COCKTAIL ~~LOC~~
30.0000 mL | Freq: Once | ORAL | Status: AC
  Administered 2016-08-03: 30 mL via ORAL
  Filled 2016-08-03: qty 30

## 2016-08-03 MED ORDER — KETOROLAC TROMETHAMINE 30 MG/ML IJ SOLN
30.0000 mg | Freq: Once | INTRAMUSCULAR | Status: AC
  Administered 2016-08-03: 30 mg via INTRAMUSCULAR
  Filled 2016-08-03: qty 1

## 2016-08-03 MED ORDER — ONDANSETRON 4 MG PO TBDP
4.0000 mg | ORAL_TABLET | Freq: Once | ORAL | Status: AC
  Administered 2016-08-03: 4 mg via ORAL
  Filled 2016-08-03: qty 1

## 2016-08-03 NOTE — ED Notes (Signed)
Pt continues to c/o worsening abdominal pain, PA Dansie aware and at bedside

## 2016-08-03 NOTE — ED Notes (Signed)
Patient called out stating his pain is worse, upon this RN entering the room, patient reports the GI cocktail he was given is "making his stomach bloating worse." this RN helped reposition patient on stretcher, advised patient that medication should improve pain. Pain then reported he felt like pain was starting to ease off. This RN advised patient that RN would reassess his pain in a few minutes to see if his pain is continuing to decrease. Patient was agreeable with this plan.

## 2016-08-03 NOTE — ED Triage Notes (Addendum)
Patient here with ongoing right sided abdominal pain that he describes as a bloated feeling, no nausea, no vomiting. Here on the 28th for same and states that he feels no better. Has had multiple CTs for same. Alert and oriented, NAD
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
## 2016-08-03 NOTE — ED Notes (Signed)
Went in pt's room to check vital signs, upon entering room pt is reporting severe back pain, states his abd pain is better but his lower back is 10/10 pain.  Pt states his pain feels better when laying in fetal position with feet up in air as well as laying on the call bell remote.  This nurse asked if pt would like to try using rolled towels under his back instead of call bell.  Pt verbalizes he will try it, soon after laying on the towel pt stated he needed the remote behind his back as well as the towels.

## 2016-08-05 ENCOUNTER — Emergency Department (HOSPITAL_COMMUNITY)
Admission: EM | Admit: 2016-08-05 | Discharge: 2016-08-05 | Disposition: A | Discharge status: Home / Self Care | Payer: Medicare Other | Attending: Emergency Medicine | Admitting: Emergency Medicine

## 2016-08-05 ENCOUNTER — Encounter (HOSPITAL_COMMUNITY): Payer: Self-pay | Admitting: Emergency Medicine

## 2016-08-05 DIAGNOSIS — Z87891 Personal history of nicotine dependence: Secondary | ICD-10-CM | POA: Diagnosis not present

## 2016-08-05 DIAGNOSIS — R1013 Epigastric pain: Secondary | ICD-10-CM | POA: Diagnosis present

## 2016-08-05 DIAGNOSIS — N189 Chronic kidney disease, unspecified: Secondary | ICD-10-CM | POA: Diagnosis not present

## 2016-08-05 DIAGNOSIS — Z79899 Other long term (current) drug therapy: Secondary | ICD-10-CM | POA: Diagnosis not present

## 2016-08-05 DIAGNOSIS — R1084 Generalized abdominal pain: Secondary | ICD-10-CM | POA: Insufficient documentation

## 2016-08-05 LAB — COMPREHENSIVE METABOLIC PANEL
ALT: 20 U/L (ref 17–63)
AST: 25 U/L (ref 15–41)
Albumin: 4 g/dL (ref 3.5–5.0)
Alkaline Phosphatase: 58 U/L (ref 38–126)
Anion gap: 10 (ref 5–15)
BUN: 10 mg/dL (ref 6–20)
CO2: 25 mmol/L (ref 22–32)
Calcium: 9.7 mg/dL (ref 8.9–10.3)
Chloride: 104 mmol/L (ref 101–111)
Creatinine, Ser: 1.06 mg/dL (ref 0.61–1.24)
GFR calc Af Amer: >60 mL/min (ref >60)
GFR calc non Af Amer: >60 mL/min (ref >60)
Glucose, Bld: 101 mg/dL — ABNORMAL HIGH (ref 65–99)
Potassium: 3.8 mmol/L (ref 3.5–5.1)
Sodium: 139 mmol/L (ref 135–145)
Total Bilirubin: 0.8 mg/dL (ref 0.3–1.2)
Total Protein: 6.9 g/dL (ref 6.5–8.1)

## 2016-08-05 LAB — CBC
HCT: 42.8 % (ref 39.0–52.0)
Hemoglobin: 14.3 g/dL (ref 13.0–17.0)
MCH: 31.1 pg (ref 26.0–34.0)
MCHC: 33.4 g/dL (ref 30.0–36.0)
MCV: 93 fL (ref 78.0–100.0)
Platelets: 243 10*3/uL (ref 150–400)
RBC: 4.6 MIL/uL (ref 4.22–5.81)
RDW: 13.2 % (ref 11.5–15.5)
WBC: 8.8 10*3/uL (ref 4.0–10.5)

## 2016-08-05 LAB — LIPASE, BLOOD: Lipase: 25 U/L (ref 11–51)

## 2016-08-05 MED ORDER — SODIUM CHLORIDE 0.9 % IV BOLUS (SEPSIS)
1000.0000 mL | Freq: Once | INTRAVENOUS | Status: AC
  Administered 2016-08-05: 1000 mL via INTRAVENOUS

## 2016-08-05 MED ORDER — DICYCLOMINE HCL 10 MG/5ML PO SOLN
20.0000 mg | Freq: Once | ORAL | Status: AC
  Administered 2016-08-05: 20 mg via ORAL
  Filled 2016-08-05: qty 10

## 2016-08-05 MED ORDER — ONDANSETRON 4 MG PO TBDP
8.0000 mg | ORAL_TABLET | Freq: Once | ORAL | Status: AC
  Administered 2016-08-05: 8 mg via ORAL
  Filled 2016-08-05: qty 2

## 2016-08-05 MED ORDER — KETOROLAC TROMETHAMINE 60 MG/2ML IM SOLN
60.0000 mg | Freq: Once | INTRAMUSCULAR | Status: AC
  Administered 2016-08-05: 60 mg via INTRAMUSCULAR
  Filled 2016-08-05: qty 2

## 2016-08-05 MED ORDER — GI COCKTAIL ~~LOC~~
30.0000 mL | Freq: Once | ORAL | Status: AC
  Administered 2016-08-05: 30 mL via ORAL
  Filled 2016-08-05: qty 30

## 2016-08-05 NOTE — ED Triage Notes (Signed)
Pt arrives with abdominal pain, states seen yesterday but states pain came back. Pt states "it's in my stomach" described as a burning. Gestures to mid abdomen. Alert x4, ambulatory.

## 2016-08-05 NOTE — Discharge Instructions (Signed)
Continue your medications as before.  Follow-up with your gastroenterologist/primary Dr.
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
## 2016-08-05 NOTE — ED Notes (Signed)
Pt tolerating PO fluids at this time.

## 2016-08-05 NOTE — ED Notes (Signed)
Patient left at this time with all belongings. 

## 2016-08-05 NOTE — ED Notes (Signed)
MD at bedside. 

## 2016-08-08 MED FILL — raNITIdine HCL 150 MG TABS: 150 | 30 days supply | Qty: 30 | Fill #0

## 2016-08-10 ENCOUNTER — Emergency Department (HOSPITAL_COMMUNITY)
Admission: EM | Admit: 2016-08-10 | Discharge: 2016-08-10 | Disposition: A | Discharge status: Home / Self Care | Payer: Medicare Other | Attending: Emergency Medicine | Admitting: Emergency Medicine

## 2016-08-10 ENCOUNTER — Encounter (HOSPITAL_COMMUNITY): Payer: Self-pay

## 2016-08-10 DIAGNOSIS — Z87891 Personal history of nicotine dependence: Secondary | ICD-10-CM | POA: Insufficient documentation

## 2016-08-10 DIAGNOSIS — R1033 Periumbilical pain: Secondary | ICD-10-CM | POA: Diagnosis not present

## 2016-08-10 DIAGNOSIS — G8929 Other chronic pain: Secondary | ICD-10-CM | POA: Diagnosis not present

## 2016-08-10 DIAGNOSIS — N189 Chronic kidney disease, unspecified: Secondary | ICD-10-CM | POA: Insufficient documentation

## 2016-08-10 DIAGNOSIS — R109 Unspecified abdominal pain: Secondary | ICD-10-CM

## 2016-08-10 LAB — COMPREHENSIVE METABOLIC PANEL
ALT: 21 U/L (ref 17–63)
AST: 25 U/L (ref 15–41)
Albumin: 4.1 g/dL (ref 3.5–5.0)
Alkaline Phosphatase: 60 U/L (ref 38–126)
Anion gap: 9 (ref 5–15)
BUN: 11 mg/dL (ref 6–20)
CO2: 26 mmol/L (ref 22–32)
Calcium: 9.7 mg/dL (ref 8.9–10.3)
Chloride: 103 mmol/L (ref 101–111)
Creatinine, Ser: 1.05 mg/dL (ref 0.61–1.24)
GFR calc Af Amer: >60 mL/min (ref >60)
GFR calc non Af Amer: >60 mL/min (ref >60)
Glucose, Bld: 80 mg/dL (ref 65–99)
Potassium: 4 mmol/L (ref 3.5–5.1)
Sodium: 138 mmol/L (ref 135–145)
Total Bilirubin: 0.7 mg/dL (ref 0.3–1.2)
Total Protein: 7.3 g/dL (ref 6.5–8.1)

## 2016-08-10 LAB — CBC WITH DIFFERENTIAL/PLATELET
Basophils Absolute: 0.1 10*3/uL (ref 0.0–0.1)
Basophils Relative: 1 %
Eosinophils Absolute: 0.2 10*3/uL (ref 0.0–0.7)
Eosinophils Relative: 2 %
HCT: 45.1 % (ref 39.0–52.0)
Hemoglobin: 15.2 g/dL (ref 13.0–17.0)
Lymphocytes Relative: 40 %
Lymphs Abs: 3 10*3/uL (ref 0.7–4.0)
MCH: 31.5 pg (ref 26.0–34.0)
MCHC: 33.7 g/dL (ref 30.0–36.0)
MCV: 93.4 fL (ref 78.0–100.0)
Monocytes Absolute: 0.6 10*3/uL (ref 0.1–1.0)
Monocytes Relative: 8 %
Neutro Abs: 3.6 10*3/uL (ref 1.7–7.7)
Neutrophils Relative %: 49 %
Platelets: 251 10*3/uL (ref 150–400)
RBC: 4.83 MIL/uL (ref 4.22–5.81)
RDW: 13.1 % (ref 11.5–15.5)
WBC: 7.4 10*3/uL (ref 4.0–10.5)

## 2016-08-10 LAB — LIPASE, BLOOD: Lipase: 24 U/L (ref 11–51)

## 2016-08-10 MED ORDER — GI COCKTAIL ~~LOC~~
30.0000 mL | Freq: Once | ORAL | Status: AC
  Administered 2016-08-10: 30 mL via ORAL
  Filled 2016-08-10: qty 30

## 2016-08-10 MED ORDER — KETOROLAC TROMETHAMINE 60 MG/2ML IM SOLN
60.0000 mg | Freq: Once | INTRAMUSCULAR | Status: AC
  Administered 2016-08-10: 60 mg via INTRAMUSCULAR
  Filled 2016-08-10: qty 2

## 2016-08-10 MED ORDER — ONDANSETRON 4 MG PO TBDP
4.0000 mg | ORAL_TABLET | Freq: Once | ORAL | Status: AC
  Administered 2016-08-10: 4 mg via ORAL
  Filled 2016-08-10: qty 1

## 2016-08-10 NOTE — Discharge Instructions (Signed)
Recommend following up with your primary care doctor for consideration for colonoscopy. Today's labs without any significant abnormalities.
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
## 2016-08-10 NOTE — ED Triage Notes (Signed)
Pt. Reports having abdominal pain for 5-7 days along  With black tarry stool.  Skin is warm and dry. No distress noted.  Pt. Also having n/v

## 2016-08-16 MED FILL — ?CYCLOBENZAPRINE 10 MG TABL: 10 | 20 days supply | Qty: 60 | Fill #1

## 2016-08-16 MED FILL — ONDANSETRON ODT 4 MG TABLET: 4 | 10 days supply | Qty: 30 | Fill #0

## 2016-08-16 MED FILL — ?OMEPRAZOLE DR 20 MG CAPSUL: 20 | 30 days supply | Qty: 30 | Fill #1

## 2016-08-16 MED FILL — traMADol HCL 50 MG TABS: 50 | 45 days supply | Qty: 90 | Fill #0

## 2016-08-31 ENCOUNTER — Encounter: Payer: Self-pay | Admitting: Internal Medicine

## 2016-08-31 ENCOUNTER — Ambulatory Visit: Payer: Medicare Other | Attending: Internal Medicine | Admitting: Internal Medicine

## 2016-08-31 VITALS — BP 115/77 | HR 95 | Temp 97.8°F | Resp 16 | Wt 138.8 lb

## 2016-08-31 DIAGNOSIS — K2100 Gastro-esophageal reflux disease with esophagitis, without bleeding: Secondary | ICD-10-CM

## 2016-08-31 DIAGNOSIS — G8929 Other chronic pain: Secondary | ICD-10-CM | POA: Diagnosis not present

## 2016-08-31 DIAGNOSIS — Z885 Allergy status to narcotic agent status: Secondary | ICD-10-CM | POA: Diagnosis not present

## 2016-08-31 DIAGNOSIS — Z79899 Other long term (current) drug therapy: Secondary | ICD-10-CM | POA: Insufficient documentation

## 2016-08-31 DIAGNOSIS — Z888 Allergy status to other drugs, medicaments and biological substances status: Secondary | ICD-10-CM | POA: Diagnosis not present

## 2016-08-31 DIAGNOSIS — R1031 Right lower quadrant pain: Secondary | ICD-10-CM | POA: Diagnosis not present

## 2016-08-31 DIAGNOSIS — Z1211 Encounter for screening for malignant neoplasm of colon: Secondary | ICD-10-CM

## 2016-08-31 MED ORDER — OMEPRAZOLE 20 MG PO CPDR: 20.0000 mg | DELAYED_RELEASE_CAPSULE | Freq: Every day | ORAL | 3 refills | Status: DC

## 2016-08-31 MED ORDER — TRAMADOL HCL 50 MG PO TABS: 50.0000 mg | ORAL_TABLET | Freq: Two times a day (BID) | ORAL | 0 refills | Status: DC | PRN
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
## 2016-08-31 NOTE — Progress Notes (Signed)
Pt is in the office today for a physical  Pt states he is not in any pain Pt states he doesn't have any difficulty taking medication

## 2016-08-31 NOTE — Patient Instructions (Signed)

## 2016-09-06 ENCOUNTER — Ambulatory Visit: Payer: No Typology Code available for payment source

## 2016-09-11 ENCOUNTER — Ambulatory Visit: Payer: No Typology Code available for payment source | Attending: Internal Medicine

## 2016-09-11 ENCOUNTER — Telehealth: Payer: Self-pay | Admitting: Internal Medicine

## 2016-09-11 NOTE — Telephone Encounter (Signed)
Patient dropped off paperwork in regards to disability to be completed by the doctor. Patient is also needing a letter from the doctor, regarding medical conditions. Please follow up.

## 2016-09-11 NOTE — Telephone Encounter (Signed)
PCP has paperwork and patient will be contacted upon completion.

## 2016-09-13 ENCOUNTER — Encounter: Payer: Self-pay | Admitting: *Deleted

## 2016-09-13 ENCOUNTER — Other Ambulatory Visit: Payer: Self-pay | Admitting: *Deleted

## 2016-09-13 DIAGNOSIS — G8929 Other chronic pain: Secondary | ICD-10-CM

## 2016-09-13 DIAGNOSIS — R109 Unspecified abdominal pain: Principal | ICD-10-CM

## 2016-09-13 NOTE — Progress Notes (Signed)
Referral ordered per judge for patients disability claim.

## 2016-09-21 ENCOUNTER — Other Ambulatory Visit: Payer: Self-pay | Admitting: Internal Medicine

## 2016-09-21 MED ORDER — DOCUSATE SODIUM 100 MG PO CAPS: 100.0000 mg | ORAL_CAPSULE | Freq: Every day | ORAL | 3 refills | Status: DC

## 2016-09-21 NOTE — Telephone Encounter (Signed)
Patient presented to the office and received the paperwork in hand and his aware of prescriptions having refills. Tramadol request will be routed to PCP for approval or denial.

## 2016-09-21 NOTE — Telephone Encounter (Signed)
Patient needs  A refill for omeprazole and stool softener

## 2016-09-21 NOTE — Telephone Encounter (Signed)
Patient is needing refill for tramadol and cyclobenzaprine.  Please follow up.

## 2016-09-21 NOTE — Telephone Encounter (Signed)
Patient would like to know the status of paperwork

## 2016-09-21 NOTE — Telephone Encounter (Signed)
MA spoke with advocate and informed her of patients information being completed Patient presented to the office and received paperwork in hand.
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
## 2016-09-26 ENCOUNTER — Emergency Department (HOSPITAL_COMMUNITY)
Admission: EM | Admit: 2016-09-26 | Discharge: 2016-09-26 | Disposition: A | Discharge status: Home / Self Care | Payer: Medicare Other | Attending: Emergency Medicine | Admitting: Emergency Medicine

## 2016-09-26 ENCOUNTER — Encounter (HOSPITAL_COMMUNITY): Payer: Self-pay

## 2016-09-26 DIAGNOSIS — N189 Chronic kidney disease, unspecified: Secondary | ICD-10-CM | POA: Insufficient documentation

## 2016-09-26 DIAGNOSIS — Z87891 Personal history of nicotine dependence: Secondary | ICD-10-CM | POA: Diagnosis not present

## 2016-09-26 DIAGNOSIS — N3000 Acute cystitis without hematuria: Secondary | ICD-10-CM

## 2016-09-26 DIAGNOSIS — G8929 Other chronic pain: Secondary | ICD-10-CM | POA: Diagnosis not present

## 2016-09-26 DIAGNOSIS — R1084 Generalized abdominal pain: Secondary | ICD-10-CM | POA: Diagnosis present

## 2016-09-26 DIAGNOSIS — R109 Unspecified abdominal pain: Secondary | ICD-10-CM

## 2016-09-26 LAB — URINALYSIS, ROUTINE W REFLEX MICROSCOPIC
Glucose, UA: NEGATIVE mg/dL
Ketones, ur: NEGATIVE mg/dL
Nitrite: POSITIVE — AB
Protein, ur: 100 mg/dL — AB
Specific Gravity, Urine: 1.038 — ABNORMAL HIGH (ref 1.005–1.030)
pH: 6 (ref 5.0–8.0)

## 2016-09-26 LAB — COMPREHENSIVE METABOLIC PANEL
ALT: 18 U/L (ref 17–63)
AST: 21 U/L (ref 15–41)
Albumin: 4.6 g/dL (ref 3.5–5.0)
Alkaline Phosphatase: 69 U/L (ref 38–126)
Anion gap: 9 (ref 5–15)
BUN: 13 mg/dL (ref 6–20)
CO2: 24 mmol/L (ref 22–32)
Calcium: 9.7 mg/dL (ref 8.9–10.3)
Chloride: 104 mmol/L (ref 101–111)
Creatinine, Ser: 1.17 mg/dL (ref 0.61–1.24)
GFR calc Af Amer: >60 mL/min (ref >60)
GFR calc non Af Amer: >60 mL/min (ref >60)
Glucose, Bld: 110 mg/dL — ABNORMAL HIGH (ref 65–99)
Potassium: 3.6 mmol/L (ref 3.5–5.1)
Sodium: 137 mmol/L (ref 135–145)
Total Bilirubin: 0.9 mg/dL (ref 0.3–1.2)
Total Protein: 7.7 g/dL (ref 6.5–8.1)

## 2016-09-26 LAB — CBC WITH DIFFERENTIAL/PLATELET
Basophils Absolute: 0.1 10*3/uL (ref 0.0–0.1)
Basophils Relative: 1 %
Eosinophils Absolute: 0.4 10*3/uL (ref 0.0–0.7)
Eosinophils Relative: 4 %
HCT: 47 % (ref 39.0–52.0)
Hemoglobin: 16.6 g/dL (ref 13.0–17.0)
Lymphocytes Relative: 32 %
Lymphs Abs: 3.2 10*3/uL (ref 0.7–4.0)
MCH: 31.9 pg (ref 26.0–34.0)
MCHC: 35.3 g/dL (ref 30.0–36.0)
MCV: 90.4 fL (ref 78.0–100.0)
Monocytes Absolute: 0.9 10*3/uL (ref 0.1–1.0)
Monocytes Relative: 9 %
Neutro Abs: 5.5 10*3/uL (ref 1.7–7.7)
Neutrophils Relative %: 54 %
Platelets: 241 10*3/uL (ref 150–400)
RBC: 5.2 MIL/uL (ref 4.22–5.81)
RDW: 13.7 % (ref 11.5–15.5)
WBC: 10 10*3/uL (ref 4.0–10.5)

## 2016-09-26 LAB — LIPASE, BLOOD: Lipase: 25 U/L (ref 11–51)

## 2016-09-26 LAB — URINE MICROSCOPIC-ADD ON

## 2016-09-26 MED ORDER — HALOPERIDOL LACTATE 5 MG/ML IJ SOLN
2.0000 mg | Freq: Once | INTRAMUSCULAR | Status: AC
  Administered 2016-09-26: 2 mg via INTRAVENOUS
  Filled 2016-09-26: qty 1

## 2016-09-26 MED ORDER — LEVOFLOXACIN 750 MG PO TABS: 750.0000 mg | ORAL_TABLET | Freq: Every day | ORAL | 0 refills | Status: DC

## 2016-09-26 MED ORDER — SODIUM CHLORIDE 0.9 % IV BOLUS (SEPSIS)
1000.0000 mL | Freq: Once | INTRAVENOUS | Status: AC
  Administered 2016-09-26: 1000 mL via INTRAVENOUS

## 2016-09-26 MED FILL — ?LEVOFLOXACIN 750 MG TABLET: 750 | 5 days supply | Qty: 5 | Fill #0

## 2016-09-26 MED FILL — CYCLOBENZAPRINE 10 MG TAB: 10 | 20 days supply | Qty: 60 | Fill #2

## 2016-09-26 MED FILL — ?OMEPRAZOLE DR 20 MG CAPSUL: 20 | 30 days supply | Qty: 30 | Fill #2
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
## 2016-09-28 ENCOUNTER — Other Ambulatory Visit: Payer: Self-pay | Admitting: *Deleted

## 2016-09-28 DIAGNOSIS — R1031 Right lower quadrant pain: Principal | ICD-10-CM

## 2016-09-28 DIAGNOSIS — G8929 Other chronic pain: Secondary | ICD-10-CM

## 2016-09-28 MED ORDER — TRAMADOL HCL 50 MG PO TABS: 50.0000 mg | ORAL_TABLET | Freq: Two times a day (BID) | ORAL | 0 refills | Status: DC | PRN

## 2016-09-29 ENCOUNTER — Emergency Department (HOSPITAL_COMMUNITY)
Admission: EM | Admit: 2016-09-29 | Discharge: 2016-09-29 | Disposition: A | Discharge status: Home / Self Care | Payer: Medicare Other | Attending: Emergency Medicine | Admitting: Emergency Medicine

## 2016-09-29 ENCOUNTER — Encounter (HOSPITAL_COMMUNITY): Payer: Self-pay | Admitting: Emergency Medicine

## 2016-09-29 DIAGNOSIS — R109 Unspecified abdominal pain: Secondary | ICD-10-CM | POA: Diagnosis present

## 2016-09-29 DIAGNOSIS — N189 Chronic kidney disease, unspecified: Secondary | ICD-10-CM | POA: Insufficient documentation

## 2016-09-29 DIAGNOSIS — Z87891 Personal history of nicotine dependence: Secondary | ICD-10-CM | POA: Insufficient documentation

## 2016-09-29 DIAGNOSIS — R1011 Right upper quadrant pain: Secondary | ICD-10-CM | POA: Insufficient documentation

## 2016-09-29 DIAGNOSIS — R10811 Right upper quadrant abdominal tenderness: Secondary | ICD-10-CM | POA: Diagnosis not present

## 2016-09-29 DIAGNOSIS — K2970 Gastritis, unspecified, without bleeding: Secondary | ICD-10-CM | POA: Diagnosis not present

## 2016-09-29 LAB — COMPREHENSIVE METABOLIC PANEL
ALT: 18 U/L (ref 17–63)
AST: 24 U/L (ref 15–41)
Albumin: 4.8 g/dL (ref 3.5–5.0)
Alkaline Phosphatase: 68 U/L (ref 38–126)
Anion gap: 14 (ref 5–15)
BUN: 15 mg/dL (ref 6–20)
CO2: 22 mmol/L (ref 22–32)
Calcium: 10.1 mg/dL (ref 8.9–10.3)
Chloride: 101 mmol/L (ref 101–111)
Creatinine, Ser: 1.1 mg/dL (ref 0.61–1.24)
GFR calc Af Amer: >60 mL/min (ref >60)
GFR calc non Af Amer: >60 mL/min (ref >60)
Glucose, Bld: 107 mg/dL — ABNORMAL HIGH (ref 65–99)
Potassium: 3.9 mmol/L (ref 3.5–5.1)
Sodium: 137 mmol/L (ref 135–145)
Total Bilirubin: 1.1 mg/dL (ref 0.3–1.2)
Total Protein: 8.3 g/dL — ABNORMAL HIGH (ref 6.5–8.1)

## 2016-09-29 LAB — URINE MICROSCOPIC-ADD ON

## 2016-09-29 LAB — URINALYSIS, ROUTINE W REFLEX MICROSCOPIC
Glucose, UA: NEGATIVE mg/dL
Ketones, ur: 15 mg/dL — AB
Nitrite: NEGATIVE
Protein, ur: 30 mg/dL — AB
Specific Gravity, Urine: 1.039 — ABNORMAL HIGH (ref 1.005–1.030)
pH: 6 (ref 5.0–8.0)

## 2016-09-29 LAB — CBC WITH DIFFERENTIAL/PLATELET
Basophils Absolute: 0 10*3/uL (ref 0.0–0.1)
Basophils Relative: 0 %
Eosinophils Absolute: 0.1 10*3/uL (ref 0.0–0.7)
Eosinophils Relative: 1 %
HCT: 44.2 % (ref 39.0–52.0)
Hemoglobin: 16 g/dL (ref 13.0–17.0)
Lymphocytes Relative: 32 %
Lymphs Abs: 3.2 10*3/uL (ref 0.7–4.0)
MCH: 32.2 pg (ref 26.0–34.0)
MCHC: 36.2 g/dL — ABNORMAL HIGH (ref 30.0–36.0)
MCV: 88.9 fL (ref 78.0–100.0)
Monocytes Absolute: 0.9 10*3/uL (ref 0.1–1.0)
Monocytes Relative: 9 %
Neutro Abs: 5.8 10*3/uL (ref 1.7–7.7)
Neutrophils Relative %: 58 %
Platelets: 242 10*3/uL (ref 150–400)
RBC: 4.97 MIL/uL (ref 4.22–5.81)
RDW: 13.1 % (ref 11.5–15.5)
WBC: 10 10*3/uL (ref 4.0–10.5)

## 2016-09-29 LAB — LIPASE, BLOOD: Lipase: 23 U/L (ref 11–51)

## 2016-09-29 MED ORDER — TRAMADOL HCL 50 MG PO TABS
50.0000 mg | ORAL_TABLET | Freq: Once | ORAL | Status: AC
  Administered 2016-09-29: 50 mg via ORAL
  Filled 2016-09-29: qty 1

## 2016-09-29 MED ORDER — ONDANSETRON 4 MG PO TBDP: 4.0000 mg | ORAL_TABLET | Freq: Three times a day (TID) | ORAL | 0 refills | Status: DC | PRN

## 2016-09-29 MED ORDER — SODIUM CHLORIDE 0.9 % IV BOLUS (SEPSIS)
1000.0000 mL | Freq: Once | INTRAVENOUS | Status: AC
  Administered 2016-09-29: 1000 mL via INTRAVENOUS

## 2016-09-29 MED FILL — traMADol HCL 50 MG TABS: 50 | 30 days supply | Qty: 60 | Fill #0
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
## 2016-09-29 NOTE — ED Notes (Signed)
PA notified that labs were clotted per lab. Tatyana Phelbotomist at bedside to attempt recollect.

## 2016-09-29 NOTE — ED Notes (Signed)
This RN attempted IV stick.

## 2016-09-29 NOTE — ED Notes (Signed)
This RN attmpted IV

## 2016-09-29 NOTE — ED Notes (Addendum)
PT Vomited on floor
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
## 2016-09-29 NOTE — ED Notes (Signed)
Patient given water. Patient currently on the phone. No distress noted,

## 2016-09-29 NOTE — ED Notes (Signed)
PA aware patient IV infiltrated.  Advised PO challenged.

## 2016-09-29 NOTE — Discharge Instructions (Signed)
Follow up with your PCP. Take home pain and nausea meds as needed. Discontinue antibiotics.

## 2016-09-29 NOTE — ED Notes (Signed)
Pt requesting a hot shower due to pain. This RN explained to pt that he is unable to shower with IV fluids and hooked up to monitor. Pt. Given hot packs for discomfort.

## 2016-09-30 LAB — URINE CULTURE: Culture: NO GROWTH

## 2016-10-06 NOTE — ED Notes (Signed)
Called to inform the pt. That he left  A bottle of Levaquin in the room.  He stated, "Throw it away."  Medication disposed of per policy

## 2016-10-23 ENCOUNTER — Encounter (HOSPITAL_COMMUNITY): Payer: Self-pay

## 2016-10-23 ENCOUNTER — Emergency Department (HOSPITAL_COMMUNITY)
Admission: EM | Admit: 2016-10-23 | Discharge: 2016-10-23 | Disposition: A | Discharge status: Home / Self Care | Payer: Medicare Other | Attending: Emergency Medicine | Admitting: Emergency Medicine

## 2016-10-23 ENCOUNTER — Emergency Department (HOSPITAL_COMMUNITY): Payer: Medicare Other

## 2016-10-23 DIAGNOSIS — G8929 Other chronic pain: Secondary | ICD-10-CM | POA: Insufficient documentation

## 2016-10-23 DIAGNOSIS — Z87891 Personal history of nicotine dependence: Secondary | ICD-10-CM | POA: Insufficient documentation

## 2016-10-23 DIAGNOSIS — N189 Chronic kidney disease, unspecified: Secondary | ICD-10-CM | POA: Insufficient documentation

## 2016-10-23 DIAGNOSIS — R109 Unspecified abdominal pain: Secondary | ICD-10-CM | POA: Diagnosis present

## 2016-10-23 DIAGNOSIS — R1031 Right lower quadrant pain: Secondary | ICD-10-CM | POA: Insufficient documentation

## 2016-10-23 MED ORDER — DICYCLOMINE HCL 20 MG PO TABS: 20.0000 mg | ORAL_TABLET | Freq: Two times a day (BID) | ORAL | 0 refills | Status: DC

## 2016-10-23 MED ORDER — DICYCLOMINE HCL 10 MG PO CAPS
20.0000 mg | ORAL_CAPSULE | Freq: Once | ORAL | Status: AC
  Administered 2016-10-23: 20 mg via ORAL
  Filled 2016-10-23: qty 2

## 2016-10-23 NOTE — ED Notes (Signed)
Pt returned from xray

## 2016-10-23 NOTE — ED Notes (Signed)
Patient transported to X-ray 
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
## 2016-10-23 NOTE — ED Triage Notes (Signed)
Pt from home c/o RUQ and RLQ pain and bloating x3 days with nausea and vomiting (6 times in the past three days). Last BM was Saturday (11/18)

## 2016-10-26 ENCOUNTER — Emergency Department (HOSPITAL_COMMUNITY)
Admission: EM | Admit: 2016-10-26 | Discharge: 2016-10-26 | Disposition: A | Discharge status: Home / Self Care | Payer: Medicare Other | Attending: Emergency Medicine | Admitting: Emergency Medicine

## 2016-10-26 ENCOUNTER — Encounter (HOSPITAL_COMMUNITY): Payer: Self-pay | Admitting: *Deleted

## 2016-10-26 DIAGNOSIS — R1011 Right upper quadrant pain: Secondary | ICD-10-CM | POA: Insufficient documentation

## 2016-10-26 DIAGNOSIS — N189 Chronic kidney disease, unspecified: Secondary | ICD-10-CM | POA: Insufficient documentation

## 2016-10-26 DIAGNOSIS — Z87891 Personal history of nicotine dependence: Secondary | ICD-10-CM | POA: Diagnosis not present

## 2016-10-26 DIAGNOSIS — R109 Unspecified abdominal pain: Secondary | ICD-10-CM

## 2016-10-26 LAB — COMPREHENSIVE METABOLIC PANEL
ALT: 33 U/L (ref 17–63)
AST: 35 U/L (ref 15–41)
Albumin: 4.5 g/dL (ref 3.5–5.0)
Alkaline Phosphatase: 61 U/L (ref 38–126)
Anion gap: 8 (ref 5–15)
BUN: <5 mg/dL — ABNORMAL LOW (ref 6–20)
CO2: 29 mmol/L (ref 22–32)
Calcium: 9.6 mg/dL (ref 8.9–10.3)
Chloride: 98 mmol/L — ABNORMAL LOW (ref 101–111)
Creatinine, Ser: 1.17 mg/dL (ref 0.61–1.24)
GFR calc Af Amer: >60 mL/min (ref >60)
GFR calc non Af Amer: >60 mL/min (ref >60)
Glucose, Bld: 94 mg/dL (ref 65–99)
Potassium: 3.8 mmol/L (ref 3.5–5.1)
Sodium: 135 mmol/L (ref 135–145)
Total Bilirubin: 0.7 mg/dL (ref 0.3–1.2)
Total Protein: 7.6 g/dL (ref 6.5–8.1)

## 2016-10-26 LAB — CBC WITH DIFFERENTIAL/PLATELET
Basophils Absolute: 0 10*3/uL (ref 0.0–0.1)
Basophils Relative: 0 %
Eosinophils Absolute: 0.1 10*3/uL (ref 0.0–0.7)
Eosinophils Relative: 2 %
HCT: 47.3 % (ref 39.0–52.0)
Hemoglobin: 16.6 g/dL (ref 13.0–17.0)
Lymphocytes Relative: 41 %
Lymphs Abs: 3 10*3/uL (ref 0.7–4.0)
MCH: 31.9 pg (ref 26.0–34.0)
MCHC: 35.1 g/dL (ref 30.0–36.0)
MCV: 90.8 fL (ref 78.0–100.0)
Monocytes Absolute: 0.6 10*3/uL (ref 0.1–1.0)
Monocytes Relative: 8 %
Neutro Abs: 3.6 10*3/uL (ref 1.7–7.7)
Neutrophils Relative %: 49 %
Platelets: 211 10*3/uL (ref 150–400)
RBC: 5.21 MIL/uL (ref 4.22–5.81)
RDW: 13.1 % (ref 11.5–15.5)
WBC: 7.3 10*3/uL (ref 4.0–10.5)

## 2016-10-26 LAB — LIPASE, BLOOD: Lipase: 28 U/L (ref 11–51)

## 2016-10-26 MED ORDER — SUCRALFATE 1 G PO TABS
1.0000 g | ORAL_TABLET | Freq: Once | ORAL | Status: AC
  Administered 2016-10-26: 1 g via ORAL
  Filled 2016-10-26: qty 1

## 2016-10-26 MED ORDER — KETOROLAC TROMETHAMINE 60 MG/2ML IM SOLN
60.0000 mg | Freq: Once | INTRAMUSCULAR | Status: AC
  Administered 2016-10-26: 60 mg via INTRAMUSCULAR
  Filled 2016-10-26: qty 2

## 2016-10-26 MED ORDER — RANITIDINE HCL 150 MG/10ML PO SYRP
150.0000 mg | ORAL_SOLUTION | Freq: Once | ORAL | Status: AC
  Administered 2016-10-26: 150 mg via ORAL
  Filled 2016-10-26: qty 10

## 2016-10-26 MED ORDER — SUCRALFATE 1 G PO TABS: 1.0000 g | ORAL_TABLET | Freq: Three times a day (TID) | ORAL | 0 refills | Status: DC

## 2016-10-26 MED ORDER — RANITIDINE HCL 150 MG PO TABS: 150.0000 mg | ORAL_TABLET | Freq: Two times a day (BID) | ORAL | 0 refills | Status: DC

## 2016-10-26 NOTE — ED Triage Notes (Signed)
The pt is c.o abd pain since this past Friday when he was seen here for the same  He has an ed  Care plan  C/o nv and vomiting   Moaning and groaning doubles over in the w/c

## 2016-10-26 NOTE — ED Notes (Signed)
Entered room, patient with his head at the foot of the bed, asked pt why he was laying like this and asked him to lay in the bed with his head at the head of the bed and he stated "no, my backs hurting and this is how im going to lay."
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
## 2016-10-31 ENCOUNTER — Telehealth: Payer: Self-pay | Admitting: Internal Medicine

## 2016-10-31 MED FILL — DICYCLOMINE 20 MG TABLET: 20 | 10 days supply | Qty: 20 | Fill #0

## 2016-10-31 MED FILL — traMADol HCL 50 MG TABS: 50 | 15 days supply | Qty: 30 | Fill #1

## 2016-10-31 MED FILL — CYCLOBENZAPRINE 10 MG TAB: 10 | 20 days supply | Qty: 60 | Fill #3

## 2016-10-31 MED FILL — CARAFATE 1 GM/10 ML SUSP: 1 | 30 days supply | Qty: 1200 | Fill #0

## 2016-10-31 MED FILL — ?OMEPRAZOLE DR 20 MG CAPSUL: 20 | 30 days supply | Qty: 30 | Fill #3

## 2016-10-31 NOTE — Telephone Encounter (Signed)
Pt. Came into facility requesting a refill on Tramadol. Pt. Is also requesting to be referred out to the dentist b/c he has some chipped tooth's. Please f/u

## 2016-11-02 NOTE — Telephone Encounter (Signed)
Medication Refill  °Tramadol  °

## 2016-11-03 NOTE — Telephone Encounter (Signed)
Pt. Came into facility requesting a refill on Tramadol. Pt. Is also requesting to be referred to the dentist b/c he has chipped tooth's.  Please f/u

## 2016-11-06 ENCOUNTER — Other Ambulatory Visit: Payer: Self-pay | Admitting: Internal Medicine

## 2016-11-06 DIAGNOSIS — R1031 Right lower quadrant pain: Secondary | ICD-10-CM

## 2016-11-06 DIAGNOSIS — S025XXB Fracture of tooth (traumatic), initial encounter for open fracture: Secondary | ICD-10-CM

## 2016-11-06 DIAGNOSIS — G8929 Other chronic pain: Secondary | ICD-10-CM

## 2016-11-06 MED ORDER — TRAMADOL HCL 50 MG PO TABS: 50.0000 mg | ORAL_TABLET | Freq: Two times a day (BID) | ORAL | 0 refills | Status: DC | PRN

## 2016-11-06 NOTE — Telephone Encounter (Signed)
Ordered

## 2016-11-06 NOTE — Telephone Encounter (Signed)
Patient verified DOB Patient is aware of tramadol being placed at the front desk for pickup. Patient is also aware of Dentistry referral being placed.  MA informed patient of appointments being completed at the first of the year if the concern is not emergent. Patient expressed his understanding and had no further questions at this time.

## 2016-11-08 ENCOUNTER — Encounter (HOSPITAL_COMMUNITY): Payer: Self-pay | Admitting: *Deleted

## 2016-11-10 ENCOUNTER — Other Ambulatory Visit: Payer: Self-pay | Admitting: Gastroenterology
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
## 2016-11-14 ENCOUNTER — Encounter (HOSPITAL_COMMUNITY)
Admission: RE | Disposition: A | Discharge status: Home / Self Care | Payer: Self-pay | Source: Ambulatory Visit | Attending: Gastroenterology

## 2016-11-14 ENCOUNTER — Ambulatory Visit (HOSPITAL_COMMUNITY): Payer: Medicare Other | Admitting: Certified Registered Nurse Anesthetist

## 2016-11-14 ENCOUNTER — Ambulatory Visit (HOSPITAL_COMMUNITY)
Admission: RE | Admit: 2016-11-14 | Discharge: 2016-11-14 | Disposition: A | Discharge status: Home / Self Care | Payer: Medicare Other | Source: Ambulatory Visit | Attending: Gastroenterology | Admitting: Gastroenterology

## 2016-11-14 ENCOUNTER — Encounter (HOSPITAL_COMMUNITY): Payer: Self-pay

## 2016-11-14 DIAGNOSIS — K921 Melena: Secondary | ICD-10-CM

## 2016-11-14 DIAGNOSIS — K449 Diaphragmatic hernia without obstruction or gangrene: Secondary | ICD-10-CM | POA: Diagnosis not present

## 2016-11-14 DIAGNOSIS — K2900 Acute gastritis without bleeding: Secondary | ICD-10-CM | POA: Insufficient documentation

## 2016-11-14 DIAGNOSIS — Z9049 Acquired absence of other specified parts of digestive tract: Secondary | ICD-10-CM | POA: Diagnosis not present

## 2016-11-14 DIAGNOSIS — R1031 Right lower quadrant pain: Secondary | ICD-10-CM | POA: Diagnosis not present

## 2016-11-14 DIAGNOSIS — K219 Gastro-esophageal reflux disease without esophagitis: Secondary | ICD-10-CM | POA: Diagnosis not present

## 2016-11-14 HISTORY — PX: ESOPHAGOGASTRODUODENOSCOPY (EGD) WITH PROPOFOL: SHX5813

## 2016-11-14 HISTORY — DX: Personal history of urinary calculi: Z87.442

## 2016-11-14 HISTORY — DX: Gastro-esophageal reflux disease without esophagitis: K21.9

## 2016-11-14 HISTORY — DX: Dyspnea, unspecified: R06.00

## 2016-11-14 SURGERY — ESOPHAGOGASTRODUODENOSCOPY (EGD) WITH PROPOFOL
Anesthesia: Monitor Anesthesia Care

## 2016-11-14 MED ORDER — PROPOFOL 10 MG/ML IV BOLUS
INTRAVENOUS | Status: AC
  Filled 2016-11-14: qty 40

## 2016-11-14 MED ORDER — LACTATED RINGERS IV SOLN
INTRAVENOUS | Status: DC
  Administered 2016-11-14: 1000 mL via INTRAVENOUS

## 2016-11-14 MED ORDER — PROPOFOL 10 MG/ML IV BOLUS
INTRAVENOUS | Status: DC | PRN
  Administered 2016-11-14 (×2): 30 mg via INTRAVENOUS
  Administered 2016-11-14: 10 mg via INTRAVENOUS

## 2016-11-14 MED ORDER — PROPOFOL 500 MG/50ML IV EMUL
INTRAVENOUS | Status: DC | PRN
  Administered 2016-11-14: 250 ug/kg/min via INTRAVENOUS

## 2016-11-14 MED ORDER — ONDANSETRON HCL 4 MG/2ML IJ SOLN
INTRAMUSCULAR | Status: DC | PRN
  Administered 2016-11-14: 4 mg via INTRAVENOUS

## 2016-11-14 MED ORDER — SODIUM CHLORIDE 0.9 % IV SOLN: INTRAVENOUS | Status: DC

## 2016-11-14 MED ORDER — ONDANSETRON HCL 4 MG/2ML IJ SOLN
INTRAMUSCULAR | Status: AC
  Filled 2016-11-14: qty 2

## 2016-11-14 MED ORDER — LIDOCAINE 2% (20 MG/ML) 5 ML SYRINGE
INTRAMUSCULAR | Status: AC
  Filled 2016-11-14: qty 5

## 2016-11-14 MED ORDER — LIDOCAINE 2% (20 MG/ML) 5 ML SYRINGE
INTRAMUSCULAR | Status: DC | PRN
  Administered 2016-11-14: 100 mg via INTRAVENOUS

## 2016-11-14 SURGICAL SUPPLY — 14 items

## 2016-11-14 NOTE — Interval H&P Note (Signed)
History and Physical Interval Note:  11/14/2016 12:30 PM  [name]  has presented today for surgery, with the diagnosis of melena  The various methods of treatment have been discussed with the patient and family. After consideration of risks, benefits and other options for treatment, the patient has consented to  Procedure(s): ESOPHAGOGASTRODUODENOSCOPY (EGD) WITH PROPOFOL (N/A) as a surgical intervention .  The patient's history has been reviewed, patient examined, no change in status, stable for surgery.  I have reviewed the patient's chart and labs.  Questions were answered to the patient's satisfaction.     [place] C.

## 2016-11-14 NOTE — H&P (Signed)
Date of Initial H&P: 10/16/16  History reviewed, patient examined, no change in status, stable for surgery.

## 2016-11-14 NOTE — Op Note (Signed)
[facility] Patient Name: [name] Procedure Date: 11/14/2016 MRN: [id number] Attending MD: [name] , MD Date of Birth: 12/14/1965 CSN: [id number] Age: 50 Admit Type: Outpatient Procedure:                Upper GI endoscopy Indications:              Melena Providers:                [name], MD, Cleda Daub, RN,                            William Dalton, Technician Referring MD:              Medicines:                Propofol per Anesthesia, Monitored Anesthesia Care Complications:            No immediate complications. Estimated Blood Loss:     Estimated blood loss: none. Estimated blood loss:                            none. Procedure:                Pre-Anesthesia Assessment:                           - Prior to the procedure, a History and Physical                            was performed, and patient medications and                            allergies were reviewed. The patient's tolerance of                            previous anesthesia was also reviewed. The risks                            and benefits of the procedure and the sedation                            options and risks were discussed with the patient.                            All questions were answered, and informed consent                            was obtained. Prior Anticoagulants: The patient has                            taken no previous anticoagulant or antiplatelet                            agents. ASA Grade Assessment: II - A patient with  mild systemic disease. After reviewing the risks                            and benefits, the patient was deemed in                            satisfactory condition to undergo the procedure.                           After obtaining informed consent, the endoscope was                            passed under direct vision. Throughout the                            procedure, the patient's blood  pressure, pulse, and                            oxygen saturations were monitored continuously. The                            [id number]) scope was introduced through the                            mouth, and advanced to the second part of duodenum.                            The upper GI endoscopy was accomplished without                            difficulty. The patient tolerated the procedure                            well. Findings:      The examined esophagus was normal.      The Z-line was regular and was found 42 cm from the incisors.      Segmental mild inflammation characterized by congestion (edema),       erythema and linear erosions was found in the gastric antrum.      A small hiatal hernia was present.      The examined duodenum was normal.      Localized moderately erythematous mucosa without bleeding was found in       the gastric fundus. Impression:               - Normal esophagus.                           - Z-line regular, 42 cm from the incisors.                           - Acute gastritis.                           - Small hiatal hernia.                           -  Normal examined duodenum.                           - Erythematous mucosa in the gastric fundus.                           - No specimens collected. Moderate Sedation:      N/A- Per Anesthesia Care Recommendation:           - Patient has a contact number available for                            emergencies. The signs and symptoms of potential                            delayed complications were discussed with the                            patient. Return to normal activities tomorrow.                            Written discharge instructions were provided to the                            patient.                           - Follow an antireflux regimen.                           - Use Prilosec (omeprazole) 40 mg PO daily.                           - No aspirin, ibuprofen, naproxen, or  other                            non-steroidal anti-inflammatory drugs. Procedure Code(s):        --- Professional ---                           [phone], Esophagogastroduodenoscopy, flexible,                            transoral; diagnostic, including collection of                            specimen(s) by brushing or washing, when performed                            (separate procedure) Diagnosis Code(s):        --- Professional ---                           K92.1, Melena (includes Hematochezia)                           K29.00, Acute gastritis without bleeding  K44.9, Diaphragmatic hernia without obstruction or                            gangrene                           K31.89, Other diseases of stomach and duodenum CPT copyright 2016 [employer]. All rights reserved. The codes documented in this report are preliminary and upon coder review may  be revised to meet current compliance requirements. [name], MD 11/14/2016 12:59:42 PM This report has been signed electronically. Number of Addenda: 0

## 2016-11-14 NOTE — Anesthesia Preprocedure Evaluation (Signed)
Anesthesia Evaluation  Patient identified by MRN, date of birth, ID band Patient awake    Reviewed: Allergy & Precautions, NPO status , Patient's Chart, lab work & pertinent test results  Airway Mallampati: II  TM Distance: >3 FB Neck ROM: Full    Dental no notable dental hx.    Pulmonary neg pulmonary ROS, former smoker,    Pulmonary exam normal breath sounds clear to auscultation       Cardiovascular negative cardio ROS Normal cardiovascular exam Rhythm:Regular Rate:Normal     Neuro/Psych negative neurological ROS  negative psych ROS   GI/Hepatic negative GI ROS, Neg liver ROS,   Endo/Other  negative endocrine ROS  Renal/GU negative Renal ROS  negative genitourinary   Musculoskeletal negative musculoskeletal ROS (+)   Abdominal   Peds negative pediatric ROS (+)  Hematology negative hematology ROS (+)   Anesthesia Other Findings   Reproductive/Obstetrics negative OB ROS                             Anesthesia Physical Anesthesia Plan  ASA: II  Anesthesia Plan: MAC   Post-op Pain Management:    Induction:   Airway Management Planned: Nasal Cannula  Additional Equipment:   Intra-op Plan:   Post-operative Plan:   Informed Consent: I have reviewed the patients History and Physical, chart, labs and discussed the procedure including the risks, benefits and alternatives for the proposed anesthesia with the patient or authorized representative who has indicated his/her understanding and acceptance.   Dental advisory given  Plan Discussed with: CRNA  Anesthesia Plan Comments:         Anesthesia Quick Evaluation

## 2016-11-14 NOTE — Anesthesia Postprocedure Evaluation (Signed)
Anesthesia Post Note  Patient: [name]  Procedure(s) Performed: Procedure(s) (LRB): ESOPHAGOGASTRODUODENOSCOPY (EGD) WITH PROPOFOL (N/A)  Patient location during evaluation: Endoscopy Anesthesia Type: MAC Level of consciousness: awake and alert Pain management: pain level controlled Vital Signs Assessment: post-procedure vital signs reviewed and stable Respiratory status: spontaneous breathing, nonlabored ventilation, respiratory function stable and patient connected to nasal cannula oxygen Cardiovascular status: stable and blood pressure returned to baseline Anesthetic complications: no    Last Vitals:  Vitals:   11/14/16 1320 11/14/16 1325  BP: 119/82   Pulse: 68 83  Resp: 12 17  Temp:      Last Pain:  Vitals:   11/14/16 1300  TempSrc: Oral  PainSc: 7                  [name]

## 2016-11-14 NOTE — Transfer of Care (Signed)
Immediate Anesthesia Transfer of Care Note  Patient: [name]  Procedure(s) Performed: Procedure(s): ESOPHAGOGASTRODUODENOSCOPY (EGD) WITH PROPOFOL (N/A)  Patient Location: PACU  Anesthesia Type:MAC  Level of Consciousness:  sedated, patient cooperative and responds to stimulation  Airway & Oxygen Therapy:Patient Spontanous Breathing and Patient connected to face mask oxgen  Post-op Assessment:  Report given to PACU RN and Post -op Vital signs reviewed and stable  Post vital signs:  Reviewed and stable  Last Vitals:  Vitals:   11/14/16 1134  BP: 130/83  Pulse: 66  Resp: (!) 22  Temp: 123XX123 C    Complications: No apparent anesthesia complications

## 2016-11-14 NOTE — Discharge Instructions (Signed)

## 2016-11-16 ENCOUNTER — Encounter (HOSPITAL_COMMUNITY): Payer: Self-pay | Admitting: Gastroenterology

## 2016-11-23 ENCOUNTER — Other Ambulatory Visit: Payer: Self-pay | Admitting: Internal Medicine

## 2016-11-23 ENCOUNTER — Telehealth: Payer: Self-pay | Admitting: Internal Medicine

## 2016-11-23 DIAGNOSIS — M62838 Other muscle spasm: Secondary | ICD-10-CM

## 2016-11-23 DIAGNOSIS — R1031 Right lower quadrant pain: Principal | ICD-10-CM

## 2016-11-23 DIAGNOSIS — K2100 Gastro-esophageal reflux disease with esophagitis, without bleeding: Secondary | ICD-10-CM

## 2016-11-23 DIAGNOSIS — G8929 Other chronic pain: Secondary | ICD-10-CM

## 2016-11-23 MED ORDER — CYCLOBENZAPRINE HCL 10 MG PO TABS: 10.0000 mg | ORAL_TABLET | Freq: Three times a day (TID) | ORAL | 0 refills | Status: DC | PRN

## 2016-11-23 MED ORDER — OMEPRAZOLE 20 MG PO CPDR: 20.0000 mg | DELAYED_RELEASE_CAPSULE | Freq: Every day | ORAL | 3 refills | Status: DC

## 2016-11-23 NOTE — Telephone Encounter (Signed)
Patient is needing a refill for Tramadol,

## 2016-11-23 NOTE — Telephone Encounter (Signed)
Patient is needing a refill for Prilosec, flexeril. Please follow up.

## 2016-11-23 NOTE — Telephone Encounter (Signed)
Requested medications refilled 

## 2016-12-01 ENCOUNTER — Emergency Department (HOSPITAL_COMMUNITY)
Admission: EM | Admit: 2016-12-01 | Discharge: 2016-12-01 | Disposition: A | Discharge status: Home / Self Care | Payer: Medicare Other | Attending: Emergency Medicine | Admitting: Emergency Medicine

## 2016-12-01 ENCOUNTER — Other Ambulatory Visit: Payer: Self-pay | Admitting: Internal Medicine

## 2016-12-01 DIAGNOSIS — Z87891 Personal history of nicotine dependence: Secondary | ICD-10-CM | POA: Insufficient documentation

## 2016-12-01 DIAGNOSIS — R109 Unspecified abdominal pain: Secondary | ICD-10-CM | POA: Diagnosis present

## 2016-12-01 DIAGNOSIS — Z79899 Other long term (current) drug therapy: Secondary | ICD-10-CM | POA: Diagnosis not present

## 2016-12-01 DIAGNOSIS — G8929 Other chronic pain: Secondary | ICD-10-CM

## 2016-12-01 DIAGNOSIS — R1013 Epigastric pain: Secondary | ICD-10-CM

## 2016-12-01 DIAGNOSIS — K2970 Gastritis, unspecified, without bleeding: Secondary | ICD-10-CM | POA: Diagnosis not present

## 2016-12-01 DIAGNOSIS — R1031 Right lower quadrant pain: Principal | ICD-10-CM

## 2016-12-01 LAB — COMPREHENSIVE METABOLIC PANEL
ALT: 23 U/L (ref 17–63)
AST: 27 U/L (ref 15–41)
Albumin: 3.7 g/dL (ref 3.5–5.0)
Alkaline Phosphatase: 63 U/L (ref 38–126)
Anion gap: 9 (ref 5–15)
BUN: 7 mg/dL (ref 6–20)
CO2: 24 mmol/L (ref 22–32)
Calcium: 9.5 mg/dL (ref 8.9–10.3)
Chloride: 102 mmol/L (ref 101–111)
Creatinine, Ser: 1.04 mg/dL (ref 0.61–1.24)
GFR calc Af Amer: >60 mL/min (ref >60)
GFR calc non Af Amer: >60 mL/min (ref >60)
Glucose, Bld: 105 mg/dL — ABNORMAL HIGH (ref 65–99)
Potassium: 3.8 mmol/L (ref 3.5–5.1)
Sodium: 135 mmol/L (ref 135–145)
Total Bilirubin: 0.6 mg/dL (ref 0.3–1.2)
Total Protein: 7.5 g/dL (ref 6.5–8.1)

## 2016-12-01 LAB — CBC WITH DIFFERENTIAL/PLATELET
Basophils Absolute: 0 10*3/uL (ref 0.0–0.1)
Basophils Relative: 1 %
Eosinophils Absolute: 0.1 10*3/uL (ref 0.0–0.7)
Eosinophils Relative: 1 %
HCT: 44.9 % (ref 39.0–52.0)
Hemoglobin: 15.5 g/dL (ref 13.0–17.0)
Lymphocytes Relative: 29 %
Lymphs Abs: 2.5 10*3/uL (ref 0.7–4.0)
MCH: 31.6 pg (ref 26.0–34.0)
MCHC: 34.5 g/dL (ref 30.0–36.0)
MCV: 91.4 fL (ref 78.0–100.0)
Monocytes Absolute: 0.5 10*3/uL (ref 0.1–1.0)
Monocytes Relative: 6 %
Neutro Abs: 5.4 10*3/uL (ref 1.7–7.7)
Neutrophils Relative %: 63 %
Platelets: 228 10*3/uL (ref 150–400)
RBC: 4.91 MIL/uL (ref 4.22–5.81)
RDW: 12.9 % (ref 11.5–15.5)
WBC: 8.6 10*3/uL (ref 4.0–10.5)

## 2016-12-01 LAB — LIPASE, BLOOD: Lipase: 22 U/L (ref 11–51)

## 2016-12-01 MED ORDER — GI COCKTAIL ~~LOC~~
30.0000 mL | Freq: Once | ORAL | Status: AC
  Administered 2016-12-01: 30 mL via ORAL
  Filled 2016-12-01: qty 30

## 2016-12-01 MED ORDER — ACETAMINOPHEN 325 MG PO TABS
650.0000 mg | ORAL_TABLET | Freq: Once | ORAL | Status: AC
  Administered 2016-12-01: 650 mg via ORAL
  Filled 2016-12-01: qty 2

## 2016-12-01 MED ORDER — TRAMADOL HCL 50 MG PO TABS: 50.0000 mg | ORAL_TABLET | Freq: Four times a day (QID) | ORAL | 0 refills | Status: DC | PRN

## 2016-12-01 MED ORDER — FAMOTIDINE 20 MG PO TABS
20.0000 mg | ORAL_TABLET | Freq: Once | ORAL | Status: AC
  Administered 2016-12-01: 20 mg via ORAL
  Filled 2016-12-01: qty 1

## 2016-12-01 NOTE — Telephone Encounter (Signed)
Pt called to follow up on refill on Tramadol.  Pt was informed that medication has not been refilled yet Pt is upset stating that his Tramadol Rx is supposed to be filled every 25th of the month regardless of last date on Rx Writer offered to put in another refill request to PCP for pt but pt became upset and stated he will be coming into the office today to discuss matter

## 2016-12-01 NOTE — ED Notes (Signed)
Pt c/o itching to L wrist area post phlebotomy collecting labs, this RN washed the area with soap & water, placed a band aid on the insertion site where phlebotomy collected the labs, provided heat pack to the area, & encouraged the pt not to scratch the area, no swelling or redness noted to the area

## 2016-12-01 NOTE — ED Triage Notes (Signed)
Pt in from home c/o abd pain onset x 3 wks post endoscopy per pt, pt reports x 4 diarrhea episodes in the last 24 hrs, pt denies n/v, pt hx of abd pain, pt A&O x4

## 2016-12-01 NOTE — ED Notes (Signed)
[place], [name] at bedside discussing plan of care with patient, pt to be discharged
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
## 2016-12-01 NOTE — Discharge Instructions (Signed)
Make sure to continue prilosec, ranitidine, and carafate daily. Follow up with primary care doctor for pain management.

## 2016-12-07 MED FILL — traMADol HCL 50 MG TABS: 50 | 15 days supply | Qty: 90 | Fill #0

## 2016-12-08 MED FILL — ?OMEPRAZOLE DR 20 MG CAPSUL: 20 | 30 days supply | Qty: 30 | Fill #0

## 2016-12-08 MED FILL — CYCLOBENZAPRINE 10 MG TAB: 10 | 20 days supply | Qty: 60 | Fill #0
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
## 2016-12-18 ENCOUNTER — Other Ambulatory Visit: Payer: Self-pay | Admitting: *Deleted

## 2016-12-18 ENCOUNTER — Other Ambulatory Visit: Payer: Self-pay | Admitting: Internal Medicine

## 2016-12-18 MED ORDER — SUCRALFATE 1 GM/10ML PO SUSP: 1.0000 g | Freq: Three times a day (TID) | ORAL | 0 refills | Status: DC

## 2016-12-18 MED FILL — CARAFATE 1 GM/10 ML SUSP: 1 | 30 days supply | Qty: 420 | Fill #0

## 2016-12-18 NOTE — Telephone Encounter (Signed)
Patient is requesting a refill on Carafate which was written by a ED provider.

## 2016-12-19 MED FILL — raNITIdine HCL 150 MG TABS: 150 | 30 days supply | Qty: 30 | Fill #0

## 2017-01-03 ENCOUNTER — Emergency Department (HOSPITAL_COMMUNITY)
Admission: EM | Admit: 2017-01-03 | Discharge: 2017-01-03 | Disposition: A | Discharge status: Home / Self Care | Payer: Medicare Other | Attending: Emergency Medicine | Admitting: Emergency Medicine

## 2017-01-03 ENCOUNTER — Encounter (HOSPITAL_COMMUNITY): Payer: Self-pay

## 2017-01-03 DIAGNOSIS — G8929 Other chronic pain: Secondary | ICD-10-CM | POA: Insufficient documentation

## 2017-01-03 DIAGNOSIS — R1011 Right upper quadrant pain: Secondary | ICD-10-CM | POA: Insufficient documentation

## 2017-01-03 DIAGNOSIS — Z87891 Personal history of nicotine dependence: Secondary | ICD-10-CM | POA: Diagnosis not present

## 2017-01-03 DIAGNOSIS — R109 Unspecified abdominal pain: Secondary | ICD-10-CM | POA: Diagnosis present

## 2017-01-03 LAB — CBC WITH DIFFERENTIAL/PLATELET
Basophils Absolute: 0 10*3/uL (ref 0.0–0.1)
Basophils Relative: 0 %
Eosinophils Absolute: 0.1 10*3/uL (ref 0.0–0.7)
Eosinophils Relative: 0 %
HCT: 42.3 % (ref 39.0–52.0)
Hemoglobin: 14.8 g/dL (ref 13.0–17.0)
Lymphocytes Relative: 28 %
Lymphs Abs: 3.1 10*3/uL (ref 0.7–4.0)
MCH: 31.7 pg (ref 26.0–34.0)
MCHC: 35 g/dL (ref 30.0–36.0)
MCV: 90.6 fL (ref 78.0–100.0)
Monocytes Absolute: 0.6 10*3/uL (ref 0.1–1.0)
Monocytes Relative: 6 %
Neutro Abs: 7.3 10*3/uL (ref 1.7–7.7)
Neutrophils Relative %: 66 %
Platelets: 248 10*3/uL (ref 150–400)
RBC: 4.67 MIL/uL (ref 4.22–5.81)
RDW: 13.6 % (ref 11.5–15.5)
WBC: 11.1 10*3/uL — ABNORMAL HIGH (ref 4.0–10.5)

## 2017-01-03 LAB — URINALYSIS, ROUTINE W REFLEX MICROSCOPIC
Bacteria, UA: NONE SEEN
Glucose, UA: NEGATIVE mg/dL
Hgb urine dipstick: NEGATIVE
Ketones, ur: 5 mg/dL — AB
Leukocytes, UA: NEGATIVE
Nitrite: NEGATIVE
Protein, ur: 30 mg/dL — AB
Specific Gravity, Urine: 1.033 — ABNORMAL HIGH (ref 1.005–1.030)
pH: 5 (ref 5.0–8.0)

## 2017-01-03 LAB — COMPREHENSIVE METABOLIC PANEL
ALT: 14 U/L — ABNORMAL LOW (ref 17–63)
AST: 21 U/L (ref 15–41)
Albumin: 3.6 g/dL (ref 3.5–5.0)
Alkaline Phosphatase: 54 U/L (ref 38–126)
Anion gap: 7 (ref 5–15)
BUN: 7 mg/dL (ref 6–20)
CO2: 24 mmol/L (ref 22–32)
Calcium: 9 mg/dL (ref 8.9–10.3)
Chloride: 108 mmol/L (ref 101–111)
Creatinine, Ser: 1.03 mg/dL (ref 0.61–1.24)
GFR calc Af Amer: >60 mL/min (ref >60)
GFR calc non Af Amer: >60 mL/min (ref >60)
Glucose, Bld: 88 mg/dL (ref 65–99)
Potassium: 4 mmol/L (ref 3.5–5.1)
Sodium: 139 mmol/L (ref 135–145)
Total Bilirubin: 0.8 mg/dL (ref 0.3–1.2)
Total Protein: 6.9 g/dL (ref 6.5–8.1)

## 2017-01-03 LAB — LIPASE, BLOOD: Lipase: 23 U/L (ref 11–51)

## 2017-01-03 MED ORDER — SODIUM CHLORIDE 0.9 % IV BOLUS (SEPSIS)
1000.0000 mL | Freq: Once | INTRAVENOUS | Status: AC
  Administered 2017-01-03: 1000 mL via INTRAVENOUS

## 2017-01-03 MED ORDER — TRAMADOL HCL 50 MG PO TABS
50.0000 mg | ORAL_TABLET | Freq: Once | ORAL | Status: AC
  Administered 2017-01-03: 50 mg via ORAL
  Filled 2017-01-03: qty 1

## 2017-01-03 MED ORDER — GI COCKTAIL ~~LOC~~
30.0000 mL | Freq: Once | ORAL | Status: AC
  Administered 2017-01-03: 30 mL via ORAL
  Filled 2017-01-03: qty 30

## 2017-01-03 MED ORDER — ONDANSETRON HCL 4 MG/2ML IJ SOLN
4.0000 mg | Freq: Once | INTRAMUSCULAR | Status: AC
  Administered 2017-01-03: 4 mg via INTRAVENOUS
  Filled 2017-01-03: qty 2
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
## 2017-01-08 ENCOUNTER — Telehealth: Payer: Self-pay | Admitting: Internal Medicine

## 2017-01-08 DIAGNOSIS — K2100 Gastro-esophageal reflux disease with esophagitis, without bleeding: Secondary | ICD-10-CM

## 2017-01-08 DIAGNOSIS — M62838 Other muscle spasm: Secondary | ICD-10-CM

## 2017-01-08 MED ORDER — CYCLOBENZAPRINE HCL 10 MG PO TABS: 10.0000 mg | ORAL_TABLET | Freq: Three times a day (TID) | ORAL | 0 refills | Status: DC | PRN

## 2017-01-08 MED ORDER — ONDANSETRON 4 MG PO TBDP: 4.0000 mg | ORAL_TABLET | Freq: Three times a day (TID) | ORAL | 0 refills | Status: DC | PRN

## 2017-01-08 MED ORDER — OMEPRAZOLE 20 MG PO CPDR: 20.0000 mg | DELAYED_RELEASE_CAPSULE | Freq: Every day | ORAL | 0 refills | Status: DC

## 2017-01-08 MED FILL — ONDANSETRON ODT 4 MG TABLET: 4 | 3 days supply | Qty: 10 | Fill #0

## 2017-01-08 MED FILL — CYCLOBENZAPRINE 10 MG TAB: 10 | 20 days supply | Qty: 60 | Fill #0

## 2017-01-08 MED FILL — ?OMEPRAZOLE DR 20 MG CAPSUL: 20 | 30 days supply | Qty: 30 | Fill #0

## 2017-01-08 NOTE — Telephone Encounter (Signed)
Patient called the office to request medication refills for cyclobenzaprine (FLEXERIL) 10 MG tablet, ondansetron (ZOFRAN ODT) 4 MG disintegrating tablet, omeprazole (PRILOSEC) 20 MG capsule.   Thank you.

## 2017-01-08 NOTE — Telephone Encounter (Signed)
Refilled - patient needs office visit for further refills.

## 2017-01-08 NOTE — Telephone Encounter (Signed)
Patient called the office to request medication refill for traMADol (ULTRAM) 50 MG tablet. ° °Thank you.  °

## 2017-01-10 ENCOUNTER — Other Ambulatory Visit: Payer: Self-pay | Admitting: Internal Medicine

## 2017-01-10 DIAGNOSIS — R1031 Right lower quadrant pain: Principal | ICD-10-CM

## 2017-01-10 DIAGNOSIS — G8929 Other chronic pain: Secondary | ICD-10-CM

## 2017-01-10 MED ORDER — TRAMADOL HCL 50 MG PO TABS: 50.0000 mg | ORAL_TABLET | Freq: Four times a day (QID) | ORAL | 0 refills | Status: DC | PRN

## 2017-01-11 MED FILL — traMADol HCL 50 MG TABS: 50 | 23 days supply | Qty: 90 | Fill #0

## 2017-01-12 ENCOUNTER — Other Ambulatory Visit: Payer: Self-pay | Admitting: Internal Medicine

## 2017-01-30 ENCOUNTER — Emergency Department (HOSPITAL_COMMUNITY): Payer: Medicare Other

## 2017-01-30 ENCOUNTER — Encounter (HOSPITAL_COMMUNITY): Payer: Self-pay | Admitting: Emergency Medicine

## 2017-01-30 ENCOUNTER — Emergency Department (HOSPITAL_COMMUNITY)
Admission: EM | Admit: 2017-01-30 | Discharge: 2017-01-30 | Disposition: A | Discharge status: Home / Self Care | Payer: Medicare Other | Attending: Emergency Medicine | Admitting: Emergency Medicine

## 2017-01-30 DIAGNOSIS — Z87891 Personal history of nicotine dependence: Secondary | ICD-10-CM | POA: Diagnosis not present

## 2017-01-30 DIAGNOSIS — R1084 Generalized abdominal pain: Secondary | ICD-10-CM | POA: Diagnosis not present

## 2017-01-30 DIAGNOSIS — R1011 Right upper quadrant pain: Secondary | ICD-10-CM | POA: Insufficient documentation

## 2017-01-30 DIAGNOSIS — R109 Unspecified abdominal pain: Secondary | ICD-10-CM

## 2017-01-30 LAB — URINALYSIS, ROUTINE W REFLEX MICROSCOPIC
Bacteria, UA: NONE SEEN
Glucose, UA: NEGATIVE mg/dL
Hgb urine dipstick: NEGATIVE
Ketones, ur: 5 mg/dL — AB
Leukocytes, UA: NEGATIVE
Nitrite: NEGATIVE
Protein, ur: 100 mg/dL — AB
Specific Gravity, Urine: 1.039 — ABNORMAL HIGH (ref 1.005–1.030)
pH: 5 (ref 5.0–8.0)

## 2017-01-30 LAB — CBC WITH DIFFERENTIAL/PLATELET
Basophils Absolute: 0 10*3/uL (ref 0.0–0.1)
Basophils Relative: 0 %
Eosinophils Absolute: 0 10*3/uL (ref 0.0–0.7)
Eosinophils Relative: 1 %
HCT: 47 % (ref 39.0–52.0)
Hemoglobin: 16.4 g/dL (ref 13.0–17.0)
Lymphocytes Relative: 46 %
Lymphs Abs: 3.1 10*3/uL (ref 0.7–4.0)
MCH: 31.5 pg (ref 26.0–34.0)
MCHC: 34.9 g/dL (ref 30.0–36.0)
MCV: 90.2 fL (ref 78.0–100.0)
Monocytes Absolute: 0.5 10*3/uL (ref 0.1–1.0)
Monocytes Relative: 8 %
Neutro Abs: 3 10*3/uL (ref 1.7–7.7)
Neutrophils Relative %: 45 %
Platelets: 203 10*3/uL (ref 150–400)
RBC: 5.21 MIL/uL (ref 4.22–5.81)
RDW: 13.3 % (ref 11.5–15.5)
WBC: 6.7 10*3/uL (ref 4.0–10.5)

## 2017-01-30 LAB — COMPREHENSIVE METABOLIC PANEL
ALT: 18 U/L (ref 17–63)
AST: 27 U/L (ref 15–41)
Albumin: 4.9 g/dL (ref 3.5–5.0)
Alkaline Phosphatase: 67 U/L (ref 38–126)
Anion gap: 11 (ref 5–15)
BUN: 7 mg/dL (ref 6–20)
CO2: 26 mmol/L (ref 22–32)
Calcium: 10.3 mg/dL (ref 8.9–10.3)
Chloride: 100 mmol/L — ABNORMAL LOW (ref 101–111)
Creatinine, Ser: 1.13 mg/dL (ref 0.61–1.24)
GFR calc Af Amer: >60 mL/min (ref >60)
GFR calc non Af Amer: >60 mL/min (ref >60)
Glucose, Bld: 81 mg/dL (ref 65–99)
Potassium: 3.6 mmol/L (ref 3.5–5.1)
Sodium: 137 mmol/L (ref 135–145)
Total Bilirubin: 0.9 mg/dL (ref 0.3–1.2)
Total Protein: 8.9 g/dL — ABNORMAL HIGH (ref 6.5–8.1)

## 2017-01-30 LAB — LIPASE, BLOOD: Lipase: 34 U/L (ref 11–51)

## 2017-01-30 MED ORDER — SODIUM CHLORIDE 0.9 % IV BOLUS (SEPSIS): 1000.0000 mL | Freq: Once | INTRAVENOUS | Status: DC

## 2017-01-30 MED ORDER — TRAMADOL HCL 50 MG PO TABS
100.0000 mg | ORAL_TABLET | Freq: Once | ORAL | Status: AC
  Administered 2017-01-30: 100 mg via ORAL
  Filled 2017-01-30: qty 2

## 2017-01-30 MED ORDER — ONDANSETRON HCL 4 MG PO TABS: 8.0000 mg | ORAL_TABLET | Freq: Three times a day (TID) | ORAL | 0 refills | Status: DC | PRN

## 2017-01-30 MED ORDER — ONDANSETRON HCL 4 MG/2ML IJ SOLN
4.0000 mg | Freq: Once | INTRAMUSCULAR | Status: DC
  Filled 2017-01-30: qty 2

## 2017-01-30 MED ORDER — IOPAMIDOL (ISOVUE-300) INJECTION 61%
INTRAVENOUS | Status: AC
  Administered 2017-01-30: 100 mL
  Filled 2017-01-30: qty 100

## 2017-01-30 MED ORDER — CYCLOBENZAPRINE HCL 10 MG PO TABS
10.0000 mg | ORAL_TABLET | Freq: Once | ORAL | Status: DC
  Filled 2017-01-30: qty 1

## 2017-01-30 MED ORDER — ONDANSETRON 4 MG PO TBDP
8.0000 mg | ORAL_TABLET | Freq: Once | ORAL | Status: AC
  Administered 2017-01-30: 8 mg via ORAL
  Filled 2017-01-30: qty 2

## 2017-01-30 MED ORDER — HYDROMORPHONE HCL 2 MG/ML IJ SOLN
1.0000 mg | Freq: Once | INTRAMUSCULAR | Status: AC
  Administered 2017-01-30: 1 mg via INTRAMUSCULAR
  Filled 2017-01-30: qty 1
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
## 2017-01-30 NOTE — ED Notes (Signed)
unabale to get iv dr aware

## 2017-01-30 NOTE — ED Notes (Signed)
Patient transported to CT 

## 2017-01-30 NOTE — ED Notes (Signed)
Pt much better, sleeping now. consult for IV team

## 2017-01-30 NOTE — ED Notes (Signed)
Pt is lying sideways across bed with legs on top of bedrails and head on rolling stool. RN advised him to stop and reposition himself to maintain safety. PT refused.
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
## 2017-01-30 NOTE — ED Triage Notes (Signed)
pe rems pt from home c/o abd pain x 4 days vomited x 1 at home yesterday denies diarrhea and dysuria, states no pain meds,

## 2017-01-30 NOTE — ED Notes (Signed)
Patient refused the flexaril.  States " I will take mine at home" Discharge papers review with patient.  Belongings taken with the patient home.  No distress noted on departure.

## 2017-01-30 NOTE — ED Notes (Signed)
IV team here to start

## 2017-02-01 MED ORDER — OMEPRAZOLE 20 MG PO CPDR: 20.0000 mg | DELAYED_RELEASE_CAPSULE | Freq: Every day | ORAL | 0 refills | Status: DC

## 2017-02-01 MED ORDER — SUCRALFATE 1 GM/10ML PO SUSP: 1.0000 g | Freq: Three times a day (TID) | ORAL | 0 refills | Status: DC

## 2017-02-01 MED ORDER — RANITIDINE HCL 150 MG PO TABS: 150.0000 mg | ORAL_TABLET | Freq: Every day | ORAL | 0 refills | Status: DC

## 2017-02-01 MED FILL — CARAFATE 1 GM/10 ML SUSP: 1 | 10 days supply | Qty: 420 | Fill #0

## 2017-02-01 MED FILL — ?OMEPRAZOLE DR 20 MG CAPSUL: 20 | 30 days supply | Qty: 30 | Fill #0

## 2017-02-01 MED FILL — raNITIdine HCL 150 MG TABS: 150 | 30 days supply | Qty: 30 | Fill #0

## 2017-02-01 NOTE — Telephone Encounter (Signed)
Patient requesting refill on all medications  Pt scheduled an appointment with PCP for next Wednesday 3/7

## 2017-02-01 NOTE — Telephone Encounter (Signed)
Requested chronic medications refilled.

## 2017-02-07 ENCOUNTER — Emergency Department (HOSPITAL_COMMUNITY)
Admission: EM | Admit: 2017-02-07 | Discharge: 2017-02-07 | Disposition: A | Discharge status: Home / Self Care | Payer: Medicare Other | Attending: Emergency Medicine | Admitting: Emergency Medicine

## 2017-02-07 ENCOUNTER — Other Ambulatory Visit: Payer: Self-pay

## 2017-02-07 ENCOUNTER — Encounter (HOSPITAL_COMMUNITY): Payer: Self-pay | Admitting: Emergency Medicine

## 2017-02-07 ENCOUNTER — Ambulatory Visit (HOSPITAL_BASED_OUTPATIENT_CLINIC_OR_DEPARTMENT_OTHER): Payer: Medicare Other | Admitting: Internal Medicine

## 2017-02-07 VITALS — BP 121/84 | HR 88 | Temp 98.4°F | Resp 18 | Ht 62.0 in | Wt 148.0 lb

## 2017-02-07 DIAGNOSIS — G8929 Other chronic pain: Secondary | ICD-10-CM | POA: Diagnosis not present

## 2017-02-07 DIAGNOSIS — Z87442 Personal history of urinary calculi: Secondary | ICD-10-CM

## 2017-02-07 DIAGNOSIS — Z87891 Personal history of nicotine dependence: Secondary | ICD-10-CM | POA: Diagnosis not present

## 2017-02-07 DIAGNOSIS — R1031 Right lower quadrant pain: Secondary | ICD-10-CM

## 2017-02-07 DIAGNOSIS — Z885 Allergy status to narcotic agent status: Secondary | ICD-10-CM

## 2017-02-07 DIAGNOSIS — Z888 Allergy status to other drugs, medicaments and biological substances status: Secondary | ICD-10-CM | POA: Insufficient documentation

## 2017-02-07 DIAGNOSIS — K2100 Gastro-esophageal reflux disease with esophagitis, without bleeding: Secondary | ICD-10-CM

## 2017-02-07 DIAGNOSIS — R1084 Generalized abdominal pain: Secondary | ICD-10-CM | POA: Insufficient documentation

## 2017-02-07 DIAGNOSIS — R109 Unspecified abdominal pain: Secondary | ICD-10-CM

## 2017-02-07 MED ORDER — OMEPRAZOLE 20 MG PO CPDR: 20.0000 mg | DELAYED_RELEASE_CAPSULE | Freq: Every day | ORAL | 3 refills | Status: DC

## 2017-02-07 MED ORDER — TRAMADOL HCL 50 MG PO TABS: 50.0000 mg | ORAL_TABLET | Freq: Four times a day (QID) | ORAL | 0 refills | Status: DC | PRN

## 2017-02-07 MED ORDER — KETOROLAC TROMETHAMINE 30 MG/ML IM SOLN: 30.0000 mg | Freq: Once | INTRAMUSCULAR | 0 refills | Status: DC

## 2017-02-07 MED ORDER — DICYCLOMINE HCL 20 MG PO TABS: 20.0000 mg | ORAL_TABLET | Freq: Two times a day (BID) | ORAL | 3 refills | Status: DC

## 2017-02-07 MED ORDER — GI COCKTAIL ~~LOC~~
30.0000 mL | Freq: Once | ORAL | Status: AC
  Administered 2017-02-07: 30 mL via ORAL
  Filled 2017-02-07: qty 30

## 2017-02-07 MED ORDER — HALOPERIDOL LACTATE 5 MG/ML IJ SOLN
2.0000 mg | Freq: Once | INTRAMUSCULAR | Status: AC
  Administered 2017-02-07: 2 mg via INTRAVENOUS
  Filled 2017-02-07: qty 1

## 2017-02-07 MED ORDER — SODIUM CHLORIDE 0.9 % IV BOLUS (SEPSIS)
1000.0000 mL | Freq: Once | INTRAVENOUS | Status: AC
  Administered 2017-02-07: 1000 mL via INTRAVENOUS

## 2017-02-07 MED ORDER — RANITIDINE HCL 150 MG PO TABS: 150.0000 mg | ORAL_TABLET | Freq: Every day | ORAL | 3 refills | Status: DC

## 2017-02-07 MED ORDER — DICYCLOMINE HCL 10 MG/ML IM SOLN
20.0000 mg | Freq: Once | INTRAMUSCULAR | Status: AC
  Administered 2017-02-07: 20 mg via INTRAMUSCULAR
  Filled 2017-02-07: qty 2

## 2017-02-07 MED ORDER — KETOROLAC TROMETHAMINE 30 MG/ML IJ SOLN
30.0000 mg | Freq: Once | INTRAMUSCULAR | Status: AC
  Administered 2017-02-07: 30 mg via INTRAMUSCULAR
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
## 2017-02-07 NOTE — Patient Instructions (Signed)
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
## 2017-02-08 MED FILL — DICYCLOMINE 20 MG TABLET: 20 | 30 days supply | Qty: 60 | Fill #0

## 2017-02-08 MED FILL — traMADol HCL 50 MG TABS: 50 | 23 days supply | Qty: 90 | Fill #0

## 2017-02-09 ENCOUNTER — Encounter (HOSPITAL_COMMUNITY): Payer: Self-pay | Admitting: Emergency Medicine

## 2017-02-09 ENCOUNTER — Emergency Department (HOSPITAL_COMMUNITY)
Admission: EM | Admit: 2017-02-09 | Discharge: 2017-02-09 | Disposition: A | Discharge status: Home / Self Care | Payer: Medicare Other | Attending: Emergency Medicine | Admitting: Emergency Medicine

## 2017-02-09 DIAGNOSIS — Z87891 Personal history of nicotine dependence: Secondary | ICD-10-CM | POA: Diagnosis not present

## 2017-02-09 DIAGNOSIS — G8929 Other chronic pain: Secondary | ICD-10-CM | POA: Diagnosis not present

## 2017-02-09 DIAGNOSIS — R1031 Right lower quadrant pain: Secondary | ICD-10-CM | POA: Diagnosis not present

## 2017-02-09 MED ORDER — ONDANSETRON 4 MG PO TBDP
8.0000 mg | ORAL_TABLET | Freq: Once | ORAL | Status: AC
  Administered 2017-02-09: 8 mg via ORAL
  Filled 2017-02-09: qty 2

## 2017-02-09 MED ORDER — HYDROMORPHONE HCL 2 MG/ML IJ SOLN
1.0000 mg | Freq: Once | INTRAMUSCULAR | Status: AC
  Administered 2017-02-09: 1 mg via INTRAMUSCULAR
  Filled 2017-02-09: qty 1

## 2017-02-09 MED ORDER — LORAZEPAM 1 MG PO TABS
1.0000 mg | ORAL_TABLET | Freq: Once | ORAL | Status: AC
  Administered 2017-02-09: 1 mg via ORAL
  Filled 2017-02-09: qty 1
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
## 2017-02-09 NOTE — ED Notes (Signed)
Pt. Able to tolerate fluids.

## 2017-02-09 NOTE — ED Notes (Signed)
Pt did not need anything at this time  

## 2017-02-09 NOTE — Discharge Instructions (Signed)
It was our pleasure to provide your ER care today - we hope that you feel better.  Drink adequate fluids.  Take your ultram and bentyl or flexeril as need for pain.   Follow up with your primary care doctor in the coming week.  Also, for chronic/recurrent pain, follow up with pain management specialist, as well as your surgeon, to see if there is anything they can offer to help your pain.   Return to ER if worse, new symptoms, fevers, persistent vomiting, other concern.  You were given pain medication in the ER - no driving for the next 6 hours.

## 2017-02-19 ENCOUNTER — Encounter: Payer: Self-pay | Admitting: Internal Medicine

## 2017-02-25 ENCOUNTER — Emergency Department (HOSPITAL_COMMUNITY)
Admission: EM | Admit: 2017-02-25 | Discharge: 2017-02-25 | Disposition: A | Discharge status: Home / Self Care | Payer: Medicare Other | Attending: Emergency Medicine | Admitting: Emergency Medicine

## 2017-02-25 ENCOUNTER — Encounter (HOSPITAL_COMMUNITY): Payer: Self-pay

## 2017-02-25 ENCOUNTER — Emergency Department (HOSPITAL_COMMUNITY): Payer: Medicare Other

## 2017-02-25 DIAGNOSIS — R1030 Lower abdominal pain, unspecified: Secondary | ICD-10-CM | POA: Diagnosis not present

## 2017-02-25 DIAGNOSIS — R109 Unspecified abdominal pain: Secondary | ICD-10-CM

## 2017-02-25 DIAGNOSIS — Z87891 Personal history of nicotine dependence: Secondary | ICD-10-CM | POA: Insufficient documentation

## 2017-02-25 DIAGNOSIS — R1031 Right lower quadrant pain: Secondary | ICD-10-CM | POA: Diagnosis not present

## 2017-02-25 DIAGNOSIS — G8929 Other chronic pain: Secondary | ICD-10-CM | POA: Insufficient documentation

## 2017-02-25 LAB — CBC WITH DIFFERENTIAL/PLATELET
Basophils Absolute: 0 10*3/uL (ref 0.0–0.1)
Basophils Relative: 0 %
Eosinophils Absolute: 0.1 10*3/uL (ref 0.0–0.7)
Eosinophils Relative: 2 %
HCT: 45.9 % (ref 39.0–52.0)
Hemoglobin: 15.7 g/dL (ref 13.0–17.0)
Lymphocytes Relative: 36 %
Lymphs Abs: 2.9 10*3/uL (ref 0.7–4.0)
MCH: 31 pg (ref 26.0–34.0)
MCHC: 34.2 g/dL (ref 30.0–36.0)
MCV: 90.5 fL (ref 78.0–100.0)
Monocytes Absolute: 0.7 10*3/uL (ref 0.1–1.0)
Monocytes Relative: 8 %
Neutro Abs: 4.4 10*3/uL (ref 1.7–7.7)
Neutrophils Relative %: 54 %
Platelets: 224 10*3/uL (ref 150–400)
RBC: 5.07 MIL/uL (ref 4.22–5.81)
RDW: 13.7 % (ref 11.5–15.5)
WBC: 8.1 10*3/uL (ref 4.0–10.5)

## 2017-02-25 LAB — BASIC METABOLIC PANEL
Anion gap: 12 (ref 5–15)
BUN: 12 mg/dL (ref 6–20)
CO2: 21 mmol/L — ABNORMAL LOW (ref 22–32)
Calcium: 9.9 mg/dL (ref 8.9–10.3)
Chloride: 105 mmol/L (ref 101–111)
Creatinine, Ser: 1.25 mg/dL — ABNORMAL HIGH (ref 0.61–1.24)
GFR calc Af Amer: >60 mL/min (ref >60)
GFR calc non Af Amer: >60 mL/min (ref >60)
Glucose, Bld: 115 mg/dL — ABNORMAL HIGH (ref 65–99)
Potassium: 5 mmol/L (ref 3.5–5.1)
Sodium: 138 mmol/L (ref 135–145)

## 2017-02-25 LAB — HEPATIC FUNCTION PANEL
ALT: 27 U/L (ref 17–63)
AST: 36 U/L (ref 15–41)
Albumin: 4.5 g/dL (ref 3.5–5.0)
Alkaline Phosphatase: 67 U/L (ref 38–126)
Bilirubin, Direct: 0.4 mg/dL (ref 0.1–0.5)
Indirect Bilirubin: 0.5 mg/dL (ref 0.3–0.9)
Total Bilirubin: 0.9 mg/dL (ref 0.3–1.2)
Total Protein: 8.2 g/dL — ABNORMAL HIGH (ref 6.5–8.1)

## 2017-02-25 LAB — LIPASE, BLOOD: Lipase: 24 U/L (ref 11–51)

## 2017-02-25 MED ORDER — SODIUM CHLORIDE 0.9 % IV BOLUS (SEPSIS)
1000.0000 mL | Freq: Once | INTRAVENOUS | Status: AC
  Administered 2017-02-25: 1000 mL via INTRAVENOUS

## 2017-02-25 MED ORDER — GI COCKTAIL ~~LOC~~
30.0000 mL | Freq: Once | ORAL | Status: AC
  Administered 2017-02-25: 30 mL via ORAL
  Filled 2017-02-25: qty 30

## 2017-02-25 MED ORDER — HALOPERIDOL LACTATE 5 MG/ML IJ SOLN
2.0000 mg | Freq: Once | INTRAMUSCULAR | Status: AC
  Administered 2017-02-25: 2 mg via INTRAVENOUS
  Filled 2017-02-25: qty 1

## 2017-02-25 MED ORDER — DICYCLOMINE HCL 10 MG/ML IM SOLN
20.0000 mg | Freq: Once | INTRAMUSCULAR | Status: AC
Start: 2017-02-25 — End: 2017-02-25
  Administered 2017-02-25: 20 mg via INTRAMUSCULAR
  Filled 2017-02-25: qty 2

## 2017-02-25 NOTE — ED Notes (Signed)
Pt to xray
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
## 2017-02-25 NOTE — Discharge Instructions (Signed)
Follow-up with your primary care doctor. Call this week to arrange an appointment.   Return to the ED if you experience any worsening pain, severe fever, severe vomiting, inability to keep water down.

## 2017-02-25 NOTE — ED Notes (Signed)
EDP at bedside  

## 2017-02-28 ENCOUNTER — Encounter (HOSPITAL_COMMUNITY): Payer: Self-pay | Admitting: Emergency Medicine

## 2017-02-28 DIAGNOSIS — K5901 Slow transit constipation: Secondary | ICD-10-CM | POA: Diagnosis not present

## 2017-02-28 DIAGNOSIS — K5900 Constipation, unspecified: Secondary | ICD-10-CM | POA: Diagnosis not present

## 2017-02-28 DIAGNOSIS — K2970 Gastritis, unspecified, without bleeding: Secondary | ICD-10-CM | POA: Diagnosis not present

## 2017-02-28 DIAGNOSIS — Z87891 Personal history of nicotine dependence: Secondary | ICD-10-CM | POA: Insufficient documentation

## 2017-02-28 DIAGNOSIS — Z79899 Other long term (current) drug therapy: Secondary | ICD-10-CM | POA: Diagnosis not present

## 2017-02-28 DIAGNOSIS — R1084 Generalized abdominal pain: Secondary | ICD-10-CM | POA: Diagnosis not present

## 2017-02-28 DIAGNOSIS — R10814 Left lower quadrant abdominal tenderness: Secondary | ICD-10-CM | POA: Diagnosis not present

## 2017-02-28 NOTE — ED Triage Notes (Signed)
Patient presents with constipation. Last BM 6 days ago per patient. Patient reports this occurs monthly. Patient reports trying to take Gatorade and water but has been vomiting.

## 2017-03-01 ENCOUNTER — Emergency Department (HOSPITAL_COMMUNITY): Payer: Medicare Other

## 2017-03-01 ENCOUNTER — Emergency Department (HOSPITAL_COMMUNITY)
Admission: EM | Admit: 2017-03-01 | Discharge: 2017-03-01 | Disposition: A | Discharge status: Home / Self Care | Payer: Medicare Other | Attending: Emergency Medicine | Admitting: Emergency Medicine

## 2017-03-01 DIAGNOSIS — K5901 Slow transit constipation: Secondary | ICD-10-CM | POA: Diagnosis not present

## 2017-03-01 DIAGNOSIS — K5900 Constipation, unspecified: Secondary | ICD-10-CM | POA: Diagnosis not present

## 2017-03-01 DIAGNOSIS — R1084 Generalized abdominal pain: Secondary | ICD-10-CM

## 2017-03-01 MED ORDER — HALOPERIDOL LACTATE 5 MG/ML IJ SOLN
5.0000 mg | Freq: Once | INTRAMUSCULAR | Status: AC
  Administered 2017-03-01: 5 mg via INTRAMUSCULAR
  Filled 2017-03-01: qty 1

## 2017-03-01 MED ORDER — LACTULOSE 10 GM/15ML PO SOLN: 20.0000 g | Freq: Two times a day (BID) | ORAL | 0 refills | Status: DC | PRN

## 2017-03-01 MED ORDER — DIPHENHYDRAMINE HCL 50 MG/ML IJ SOLN
50.0000 mg | Freq: Once | INTRAMUSCULAR | Status: AC
  Administered 2017-03-01: 50 mg via INTRAMUSCULAR
  Filled 2017-03-01: qty 1

## 2017-03-01 MED ORDER — MAGNESIUM CITRATE PO SOLN: 1.0000 | Freq: Once | ORAL | 0 refills | Status: AC
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
## 2017-03-01 NOTE — ED Triage Notes (Signed)
Nurse first rounds: nurse explained delay , process and high census to pt. , pt. resting with no distress/respirations unlabored .

## 2017-03-05 MED FILL — LACTULOSE 10 GM/15 ML SOLN: 10 | 4 days supply | Qty: 240 | Fill #0

## 2017-03-06 ENCOUNTER — Encounter (HOSPITAL_COMMUNITY): Payer: Self-pay | Admitting: Emergency Medicine

## 2017-03-06 ENCOUNTER — Emergency Department (HOSPITAL_COMMUNITY)
Admission: EM | Admit: 2017-03-06 | Discharge: 2017-03-06 | Disposition: A | Discharge status: Home / Self Care | Payer: Medicare Other | Attending: Emergency Medicine | Admitting: Emergency Medicine

## 2017-03-06 DIAGNOSIS — R1031 Right lower quadrant pain: Secondary | ICD-10-CM | POA: Diagnosis not present

## 2017-03-06 DIAGNOSIS — Z79899 Other long term (current) drug therapy: Secondary | ICD-10-CM | POA: Insufficient documentation

## 2017-03-06 DIAGNOSIS — Z87891 Personal history of nicotine dependence: Secondary | ICD-10-CM | POA: Diagnosis not present

## 2017-03-06 MED ORDER — SODIUM CHLORIDE 0.9 % IV BOLUS (SEPSIS): 1000.0000 mL | Freq: Once | INTRAVENOUS | Status: DC

## 2017-03-06 NOTE — ED Notes (Signed)
Pt informed that he was being discharged by PA. Pt left without discharge paperwork or signing. Avie Echevaria, PA notified.

## 2017-03-06 NOTE — ED Notes (Signed)
Bladder scanned Pt. No volume found in bladder despite several attempts. Attempted supra pubic to umbilicus. Pt denies needing to void. Pt states he has drank 4 glasses since this am.
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
## 2017-03-06 NOTE — ED Triage Notes (Signed)
Pt sts right sided abd pain into back with hx of same

## 2017-03-08 ENCOUNTER — Ambulatory Visit: Payer: Medicare Other | Attending: Internal Medicine | Admitting: Physician Assistant

## 2017-03-08 ENCOUNTER — Encounter: Payer: Self-pay | Admitting: Physician Assistant

## 2017-03-08 VITALS — BP 128/87 | HR 88 | Temp 98.2°F | Resp 16 | Wt 130.6 lb

## 2017-03-08 DIAGNOSIS — Z87442 Personal history of urinary calculi: Secondary | ICD-10-CM | POA: Insufficient documentation

## 2017-03-08 DIAGNOSIS — R1031 Right lower quadrant pain: Secondary | ICD-10-CM | POA: Diagnosis not present

## 2017-03-08 DIAGNOSIS — R1011 Right upper quadrant pain: Secondary | ICD-10-CM | POA: Diagnosis not present

## 2017-03-08 DIAGNOSIS — K219 Gastro-esophageal reflux disease without esophagitis: Secondary | ICD-10-CM | POA: Insufficient documentation

## 2017-03-08 DIAGNOSIS — R112 Nausea with vomiting, unspecified: Secondary | ICD-10-CM | POA: Diagnosis not present

## 2017-03-08 DIAGNOSIS — Z885 Allergy status to narcotic agent status: Secondary | ICD-10-CM | POA: Insufficient documentation

## 2017-03-08 DIAGNOSIS — Z881 Allergy status to other antibiotic agents status: Secondary | ICD-10-CM | POA: Diagnosis not present

## 2017-03-08 DIAGNOSIS — G8929 Other chronic pain: Secondary | ICD-10-CM

## 2017-03-08 DIAGNOSIS — Z79899 Other long term (current) drug therapy: Secondary | ICD-10-CM | POA: Insufficient documentation

## 2017-03-08 DIAGNOSIS — M62838 Other muscle spasm: Secondary | ICD-10-CM | POA: Diagnosis not present

## 2017-03-08 DIAGNOSIS — R634 Abnormal weight loss: Secondary | ICD-10-CM | POA: Diagnosis not present

## 2017-03-08 DIAGNOSIS — Z888 Allergy status to other drugs, medicaments and biological substances status: Secondary | ICD-10-CM | POA: Diagnosis not present

## 2017-03-08 DIAGNOSIS — Z8719 Personal history of other diseases of the digestive system: Secondary | ICD-10-CM | POA: Insufficient documentation

## 2017-03-08 MED ORDER — RANITIDINE HCL 150 MG PO TABS: 150.0000 mg | ORAL_TABLET | Freq: Every day | ORAL | 3 refills | Status: DC

## 2017-03-08 MED ORDER — CYCLOBENZAPRINE HCL 10 MG PO TABS: 10.0000 mg | ORAL_TABLET | Freq: Three times a day (TID) | ORAL | 0 refills | Status: DC | PRN

## 2017-03-08 MED ORDER — ONDANSETRON 4 MG PO TBDP: 4.0000 mg | ORAL_TABLET | Freq: Three times a day (TID) | ORAL | 0 refills | Status: DC | PRN

## 2017-03-08 MED ORDER — SUCRALFATE 1 GM/10ML PO SUSP: 1.0000 g | Freq: Two times a day (BID) | ORAL | 2 refills | Status: DC

## 2017-03-08 MED ORDER — TRAMADOL HCL 50 MG PO TABS: 50.0000 mg | ORAL_TABLET | Freq: Four times a day (QID) | ORAL | 0 refills | Status: DC | PRN

## 2017-03-08 MED FILL — ?RANITIDINE 150 MG TABLET: 150 MG | 30 days supply | Qty: 30 | Fill #0

## 2017-03-08 MED FILL — ?CYCLOBENZAPRINE 10 MG TABL: 10 | 20 days supply | Qty: 60 | Fill #0

## 2017-03-08 MED FILL — ONDANSETRON ODT 4 MG TABLET: 4 | 10 days supply | Qty: 30 | Fill #0

## 2017-03-08 MED FILL — CARAFATE 1 GM/10 ML SUSP: 1 | 21 days supply | Qty: 420 | Fill #0
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
## 2017-03-09 MED FILL — traMADol HCL 50 MG TABS: 50 | 22 days supply | Qty: 90 | Fill #0

## 2017-03-12 ENCOUNTER — Other Ambulatory Visit: Payer: Medicaid Other

## 2017-03-12 LAB — H. PYLORI BREATH TEST

## 2017-03-12 LAB — H.PYLORI BREATH TEST (REFLEX): H. pylori Breath Test: NEGATIVE

## 2017-03-26 ENCOUNTER — Telehealth: Payer: Self-pay | Admitting: Internal Medicine

## 2017-03-26 NOTE — Telephone Encounter (Signed)
Patient called, requesting to be sent a referral for a Dentist since he has 3 chip tooth that need to be fix, please follow up with patient

## 2017-03-26 NOTE — Telephone Encounter (Signed)
Is this refill appropriate?  

## 2017-03-31 ENCOUNTER — Encounter (HOSPITAL_COMMUNITY): Payer: Self-pay

## 2017-03-31 ENCOUNTER — Emergency Department (HOSPITAL_COMMUNITY)
Admission: EM | Admit: 2017-03-31 | Discharge: 2017-03-31 | Disposition: A | Discharge status: Home / Self Care | Payer: Medicare Other | Attending: Emergency Medicine | Admitting: Emergency Medicine

## 2017-03-31 DIAGNOSIS — Z79899 Other long term (current) drug therapy: Secondary | ICD-10-CM | POA: Diagnosis not present

## 2017-03-31 DIAGNOSIS — Z87891 Personal history of nicotine dependence: Secondary | ICD-10-CM | POA: Insufficient documentation

## 2017-03-31 DIAGNOSIS — G8929 Other chronic pain: Secondary | ICD-10-CM | POA: Insufficient documentation

## 2017-03-31 DIAGNOSIS — R1084 Generalized abdominal pain: Secondary | ICD-10-CM | POA: Diagnosis not present

## 2017-03-31 DIAGNOSIS — R109 Unspecified abdominal pain: Secondary | ICD-10-CM

## 2017-03-31 LAB — COMPREHENSIVE METABOLIC PANEL
ALT: 19 U/L (ref 17–63)
AST: 25 U/L (ref 15–41)
Albumin: 4.4 g/dL (ref 3.5–5.0)
Alkaline Phosphatase: 60 U/L (ref 38–126)
Anion gap: 9 (ref 5–15)
BUN: 9 mg/dL (ref 6–20)
CO2: 22 mmol/L (ref 22–32)
Calcium: 9.1 mg/dL (ref 8.9–10.3)
Chloride: 106 mmol/L (ref 101–111)
Creatinine, Ser: 1 mg/dL (ref 0.61–1.24)
GFR calc Af Amer: >60 mL/min (ref >60)
GFR calc non Af Amer: >60 mL/min (ref >60)
Glucose, Bld: 82 mg/dL (ref 65–99)
Potassium: 3.9 mmol/L (ref 3.5–5.1)
Sodium: 137 mmol/L (ref 135–145)
Total Bilirubin: 0.8 mg/dL (ref 0.3–1.2)
Total Protein: 7.9 g/dL (ref 6.5–8.1)

## 2017-03-31 LAB — CBC WITH DIFFERENTIAL/PLATELET
Basophils Absolute: 0 10*3/uL (ref 0.0–0.1)
Basophils Relative: 0 %
Eosinophils Absolute: 0 10*3/uL (ref 0.0–0.7)
Eosinophils Relative: 0 %
HCT: 42.2 % (ref 39.0–52.0)
Hemoglobin: 14.6 g/dL (ref 13.0–17.0)
Lymphocytes Relative: 35 %
Lymphs Abs: 2.4 10*3/uL (ref 0.7–4.0)
MCH: 31.8 pg (ref 26.0–34.0)
MCHC: 34.6 g/dL (ref 30.0–36.0)
MCV: 91.9 fL (ref 78.0–100.0)
Monocytes Absolute: 0.6 10*3/uL (ref 0.1–1.0)
Monocytes Relative: 9 %
Neutro Abs: 3.9 10*3/uL (ref 1.7–7.7)
Neutrophils Relative %: 56 %
Platelets: 239 10*3/uL (ref 150–400)
RBC: 4.59 MIL/uL (ref 4.22–5.81)
RDW: 14.3 % (ref 11.5–15.5)
WBC: 7 10*3/uL (ref 4.0–10.5)

## 2017-03-31 LAB — LIPASE, BLOOD: Lipase: 26 U/L (ref 11–51)

## 2017-03-31 MED ORDER — SODIUM CHLORIDE 0.9 % IV SOLN: INTRAVENOUS | Status: DC

## 2017-03-31 MED ORDER — SODIUM CHLORIDE 0.9 % IV BOLUS (SEPSIS)
2000.0000 mL | Freq: Once | INTRAVENOUS | Status: AC
  Administered 2017-03-31: 2000 mL via INTRAVENOUS

## 2017-03-31 MED ORDER — LORAZEPAM 2 MG/ML IJ SOLN
1.0000 mg | Freq: Once | INTRAMUSCULAR | Status: AC
  Administered 2017-03-31: 1 mg via INTRAVENOUS
  Filled 2017-03-31: qty 1

## 2017-03-31 MED ORDER — ONDANSETRON HCL 4 MG/2ML IJ SOLN
4.0000 mg | Freq: Once | INTRAMUSCULAR | Status: AC
  Administered 2017-03-31: 4 mg via INTRAVENOUS
  Filled 2017-03-31: qty 2

## 2017-03-31 NOTE — ED Notes (Signed)
Urinal placed at pt/bedside. ENM
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
## 2017-03-31 NOTE — ED Notes (Signed)
Two unsuccessful IV to R AC and R hand. Mable Fill RN verbalizes will attempt IV.

## 2017-04-04 ENCOUNTER — Ambulatory Visit: Payer: Medicare Other | Attending: Internal Medicine | Admitting: Internal Medicine

## 2017-04-04 ENCOUNTER — Encounter: Payer: Self-pay | Admitting: Internal Medicine

## 2017-04-04 VITALS — BP 122/66 | HR 83 | Temp 98.8°F | Resp 18 | Ht 69.0 in | Wt 135.0 lb

## 2017-04-04 DIAGNOSIS — R1031 Right lower quadrant pain: Secondary | ICD-10-CM | POA: Diagnosis not present

## 2017-04-04 DIAGNOSIS — M62838 Other muscle spasm: Secondary | ICD-10-CM | POA: Diagnosis not present

## 2017-04-04 DIAGNOSIS — K219 Gastro-esophageal reflux disease without esophagitis: Secondary | ICD-10-CM | POA: Diagnosis not present

## 2017-04-04 DIAGNOSIS — G8929 Other chronic pain: Secondary | ICD-10-CM | POA: Diagnosis not present

## 2017-04-04 DIAGNOSIS — Z79899 Other long term (current) drug therapy: Secondary | ICD-10-CM | POA: Diagnosis not present

## 2017-04-04 DIAGNOSIS — R109 Unspecified abdominal pain: Secondary | ICD-10-CM | POA: Diagnosis not present

## 2017-04-04 DIAGNOSIS — R634 Abnormal weight loss: Secondary | ICD-10-CM | POA: Insufficient documentation

## 2017-04-04 MED ORDER — CYCLOBENZAPRINE HCL 10 MG PO TABS: 10.0000 mg | ORAL_TABLET | Freq: Three times a day (TID) | ORAL | 0 refills | Status: DC | PRN

## 2017-04-04 MED ORDER — TRAMADOL HCL 50 MG PO TABS: 50.0000 mg | ORAL_TABLET | Freq: Four times a day (QID) | ORAL | 0 refills | Status: DC | PRN

## 2017-04-04 MED ORDER — GI COCKTAIL ~~LOC~~
30.0000 mL | Freq: Once | ORAL | Status: AC
  Administered 2017-04-04: 30 mL via ORAL

## 2017-04-04 MED ORDER — DICYCLOMINE HCL 20 MG PO TABS: 20.0000 mg | ORAL_TABLET | Freq: Two times a day (BID) | ORAL | 3 refills | Status: DC

## 2017-04-04 MED FILL — CYCLOBENZAPRINE 10 MG TAB: 10 | 20 days supply | Qty: 60 | Fill #0

## 2017-04-04 MED FILL — raNITIdine HCL 150 MG TABS: 150 | 30 days supply | Qty: 30 | Fill #1

## 2017-04-04 MED FILL — CARAFATE 1 GM/10 ML SUSP: 1 | 21 days supply | Qty: 420 | Fill #1

## 2017-04-04 MED FILL — DICYCLOMINE 20 MG TABLET: 20 | 30 days supply | Qty: 60 | Fill #0
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
## 2017-04-04 NOTE — Progress Notes (Signed)
Patient is here for Abdominal Pain  Patient states the gastro medications cause him to vomit.  Patient has not taken medication today. Patient has not eaten today.  Patient complains of nose bleeds occurring over three days along with vomiting.

## 2017-04-04 NOTE — Patient Instructions (Signed)

## 2017-04-05 MED FILL — traMADol HCL 50 MG TABS: 50 | 22 days supply | Qty: 90 | Fill #0

## 2017-04-29 ENCOUNTER — Encounter (HOSPITAL_COMMUNITY): Payer: Self-pay

## 2017-04-29 ENCOUNTER — Emergency Department (HOSPITAL_COMMUNITY): Payer: Medicare Other

## 2017-04-29 ENCOUNTER — Emergency Department (HOSPITAL_COMMUNITY)
Admission: EM | Admit: 2017-04-29 | Discharge: 2017-04-29 | Discharge status: Against Medical Advice | Payer: Medicare Other | Attending: Emergency Medicine | Admitting: Emergency Medicine

## 2017-04-29 ENCOUNTER — Emergency Department (HOSPITAL_COMMUNITY)
Admission: EM | Admit: 2017-04-29 | Discharge: 2017-04-29 | Discharge status: Against Medical Advice | Payer: Medicare Other | Source: Home / Self Care

## 2017-04-29 DIAGNOSIS — R1012 Left upper quadrant pain: Secondary | ICD-10-CM | POA: Diagnosis not present

## 2017-04-29 DIAGNOSIS — G8929 Other chronic pain: Secondary | ICD-10-CM | POA: Diagnosis not present

## 2017-04-29 DIAGNOSIS — R1110 Vomiting, unspecified: Secondary | ICD-10-CM | POA: Diagnosis not present

## 2017-04-29 DIAGNOSIS — R1013 Epigastric pain: Secondary | ICD-10-CM | POA: Diagnosis not present

## 2017-04-29 DIAGNOSIS — R109 Unspecified abdominal pain: Secondary | ICD-10-CM

## 2017-04-29 DIAGNOSIS — G8918 Other acute postprocedural pain: Secondary | ICD-10-CM | POA: Insufficient documentation

## 2017-04-29 DIAGNOSIS — M549 Dorsalgia, unspecified: Secondary | ICD-10-CM | POA: Diagnosis not present

## 2017-04-29 DIAGNOSIS — Z87891 Personal history of nicotine dependence: Secondary | ICD-10-CM | POA: Diagnosis not present

## 2017-04-29 DIAGNOSIS — G894 Chronic pain syndrome: Secondary | ICD-10-CM | POA: Diagnosis not present

## 2017-04-29 LAB — CBC WITH DIFFERENTIAL/PLATELET
Basophils Absolute: 0 10*3/uL (ref 0.0–0.1)
Basophils Relative: 0 %
Eosinophils Absolute: 0.1 10*3/uL (ref 0.0–0.7)
Eosinophils Relative: 1 %
HCT: 40.3 % (ref 39.0–52.0)
Hemoglobin: 13.7 g/dL (ref 13.0–17.0)
Lymphocytes Relative: 47 %
Lymphs Abs: 4.1 10*3/uL — ABNORMAL HIGH (ref 0.7–4.0)
MCH: 31.3 pg (ref 26.0–34.0)
MCHC: 34 g/dL (ref 30.0–36.0)
MCV: 92 fL (ref 78.0–100.0)
Monocytes Absolute: 0.6 10*3/uL (ref 0.1–1.0)
Monocytes Relative: 7 %
Neutro Abs: 3.9 10*3/uL (ref 1.7–7.7)
Neutrophils Relative %: 45 %
Platelets: 204 10*3/uL (ref 150–400)
RBC: 4.38 MIL/uL (ref 4.22–5.81)
RDW: 13.8 % (ref 11.5–15.5)
WBC: 8.7 10*3/uL (ref 4.0–10.5)

## 2017-04-29 MED ORDER — IOPAMIDOL (ISOVUE-300) INJECTION 61%
INTRAVENOUS | Status: AC
  Administered 2017-04-29: 100 mL
  Filled 2017-04-29: qty 100

## 2017-04-29 NOTE — ED Notes (Signed)
Pt came to triage desk and said he did not want to wait to be seen and would go see his PCP tomorrow. Pt signed out AMA.
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
## 2017-04-29 NOTE — ED Notes (Signed)
Pt seen leaving facility with fast and steady gait drinking soda with backpack. Primary RN notified
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
## 2017-05-03 ENCOUNTER — Encounter (HOSPITAL_COMMUNITY): Payer: Self-pay

## 2017-05-03 ENCOUNTER — Emergency Department (HOSPITAL_COMMUNITY)
Admission: EM | Admit: 2017-05-03 | Discharge: 2017-05-03 | Disposition: A | Discharge status: Home / Self Care | Payer: Medicare Other | Attending: Emergency Medicine | Admitting: Emergency Medicine

## 2017-05-03 DIAGNOSIS — Z5321 Procedure and treatment not carried out due to patient leaving prior to being seen by health care provider: Secondary | ICD-10-CM | POA: Insufficient documentation

## 2017-05-03 DIAGNOSIS — R109 Unspecified abdominal pain: Secondary | ICD-10-CM | POA: Diagnosis not present

## 2017-05-03 LAB — COMPREHENSIVE METABOLIC PANEL
ALT: 15 U/L — ABNORMAL LOW (ref 17–63)
AST: 24 U/L (ref 15–41)
Albumin: 4.1 g/dL (ref 3.5–5.0)
Alkaline Phosphatase: 51 U/L (ref 38–126)
Anion gap: 7 (ref 5–15)
BUN: 5 mg/dL — ABNORMAL LOW (ref 6–20)
CO2: 27 mmol/L (ref 22–32)
Calcium: 9.3 mg/dL (ref 8.9–10.3)
Chloride: 101 mmol/L (ref 101–111)
Creatinine, Ser: 1.04 mg/dL (ref 0.61–1.24)
GFR calc Af Amer: >60 mL/min (ref >60)
GFR calc non Af Amer: >60 mL/min (ref >60)
Glucose, Bld: 118 mg/dL — ABNORMAL HIGH (ref 65–99)
Potassium: 3.4 mmol/L — ABNORMAL LOW (ref 3.5–5.1)
Sodium: 135 mmol/L (ref 135–145)
Total Bilirubin: 0.7 mg/dL (ref 0.3–1.2)
Total Protein: 7.2 g/dL (ref 6.5–8.1)

## 2017-05-03 LAB — CBC
HCT: 40.4 % (ref 39.0–52.0)
Hemoglobin: 13.8 g/dL (ref 13.0–17.0)
MCH: 31.1 pg (ref 26.0–34.0)
MCHC: 34.2 g/dL (ref 30.0–36.0)
MCV: 91 fL (ref 78.0–100.0)
Platelets: 232 10*3/uL (ref 150–400)
RBC: 4.44 MIL/uL (ref 4.22–5.81)
RDW: 13.5 % (ref 11.5–15.5)
WBC: 8.7 10*3/uL (ref 4.0–10.5)

## 2017-05-03 LAB — LIPASE, BLOOD: Lipase: 22 U/L (ref 11–51)

## 2017-05-03 NOTE — ED Triage Notes (Signed)
Pt states abd pain X1 week. He has multiple complaints reporting headache, abd pain, hematemesis and hematuria.

## 2017-05-03 NOTE — ED Notes (Signed)
Called pt for room in triage/wait with no answer

## 2017-05-03 NOTE — ED Notes (Signed)
Called pt for room no answer 

## 2017-05-03 NOTE — ED Notes (Signed)
Called for pt in triage/wait with no answer

## 2017-05-05 ENCOUNTER — Emergency Department (HOSPITAL_COMMUNITY)
Admission: EM | Admit: 2017-05-05 | Discharge: 2017-05-05 | Disposition: A | Discharge status: Home / Self Care | Payer: Medicare Other | Attending: Emergency Medicine | Admitting: Emergency Medicine

## 2017-05-05 ENCOUNTER — Encounter (HOSPITAL_COMMUNITY): Payer: Self-pay

## 2017-05-05 DIAGNOSIS — R1032 Left lower quadrant pain: Secondary | ICD-10-CM | POA: Diagnosis present

## 2017-05-05 DIAGNOSIS — Z87891 Personal history of nicotine dependence: Secondary | ICD-10-CM | POA: Insufficient documentation

## 2017-05-05 DIAGNOSIS — R1110 Vomiting, unspecified: Secondary | ICD-10-CM

## 2017-05-05 DIAGNOSIS — Z79899 Other long term (current) drug therapy: Secondary | ICD-10-CM | POA: Insufficient documentation

## 2017-05-05 LAB — URINALYSIS, ROUTINE W REFLEX MICROSCOPIC
Glucose, UA: NEGATIVE mg/dL
Glucose, UA: NEGATIVE mg/dL
Hgb urine dipstick: NEGATIVE
Hgb urine dipstick: NEGATIVE
Ketones, ur: 5 mg/dL — AB
Ketones, ur: 5 mg/dL — AB
Leukocytes, UA: NEGATIVE
Leukocytes, UA: NEGATIVE
Nitrite: NEGATIVE
Nitrite: NEGATIVE
Protein, ur: 30 mg/dL — AB
Protein, ur: 30 mg/dL — AB
Specific Gravity, Urine: 1.035 — ABNORMAL HIGH (ref 1.005–1.030)
Specific Gravity, Urine: 1.035 — ABNORMAL HIGH (ref 1.005–1.030)
pH: 5 (ref 5.0–8.0)
pH: 5 (ref 5.0–8.0)

## 2017-05-05 LAB — CBC
HCT: 40.1 % (ref 39.0–52.0)
Hemoglobin: 14.3 g/dL (ref 13.0–17.0)
MCH: 32.4 pg (ref 26.0–34.0)
MCHC: 35.7 g/dL (ref 30.0–36.0)
MCV: 90.9 fL (ref 78.0–100.0)
Platelets: 240 10*3/uL (ref 150–400)
RBC: 4.41 MIL/uL (ref 4.22–5.81)
RDW: 13.5 % (ref 11.5–15.5)
WBC: 8.3 10*3/uL (ref 4.0–10.5)

## 2017-05-05 LAB — COMPREHENSIVE METABOLIC PANEL
ALT: 14 U/L — ABNORMAL LOW (ref 17–63)
AST: 21 U/L (ref 15–41)
Albumin: 4.2 g/dL (ref 3.5–5.0)
Alkaline Phosphatase: 57 U/L (ref 38–126)
Anion gap: 8 (ref 5–15)
BUN: 8 mg/dL (ref 6–20)
CO2: 26 mmol/L (ref 22–32)
Calcium: 9.5 mg/dL (ref 8.9–10.3)
Chloride: 104 mmol/L (ref 101–111)
Creatinine, Ser: 1.02 mg/dL (ref 0.61–1.24)
GFR calc Af Amer: >60 mL/min (ref >60)
GFR calc non Af Amer: >60 mL/min (ref >60)
Glucose, Bld: 115 mg/dL — ABNORMAL HIGH (ref 65–99)
Potassium: 4.1 mmol/L (ref 3.5–5.1)
Sodium: 138 mmol/L (ref 135–145)
Total Bilirubin: 0.6 mg/dL (ref 0.3–1.2)
Total Protein: 7.6 g/dL (ref 6.5–8.1)

## 2017-05-05 LAB — URINALYSIS, MICROSCOPIC (REFLEX): Squamous Epithelial / LPF: NONE SEEN

## 2017-05-05 LAB — LIPASE, BLOOD: Lipase: 37 U/L (ref 11–51)

## 2017-05-05 MED ORDER — SODIUM CHLORIDE 0.9 % IV BOLUS (SEPSIS)
1000.0000 mL | Freq: Once | INTRAVENOUS | Status: AC
  Administered 2017-05-05: 1000 mL via INTRAVENOUS

## 2017-05-05 MED ORDER — LORAZEPAM 2 MG/ML IJ SOLN
2.0000 mg | Freq: Once | INTRAMUSCULAR | Status: AC
  Administered 2017-05-05: 2 mg via INTRAVENOUS
  Filled 2017-05-05: qty 1

## 2017-05-05 MED ORDER — ONDANSETRON HCL 4 MG/2ML IJ SOLN
4.0000 mg | Freq: Once | INTRAMUSCULAR | Status: AC
  Administered 2017-05-05: 4 mg via INTRAVENOUS
  Filled 2017-05-05: qty 2

## 2017-05-05 MED ORDER — ENALAPRILAT 1.25 MG/ML IV SOLN
1.2500 mg | Freq: Once | INTRAVENOUS | Status: DC
  Filled 2017-05-05: qty 1

## 2017-05-05 MED ORDER — ONDANSETRON 4 MG PO TBDP: 4.0000 mg | ORAL_TABLET | Freq: Three times a day (TID) | ORAL | 0 refills | Status: DC | PRN
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
## 2017-05-05 NOTE — Discharge Instructions (Signed)
Stop smoking marijuana.  Zofran for nausea.  Look up "THC Hyperesisis" on the internet.

## 2017-05-06 ENCOUNTER — Emergency Department (HOSPITAL_COMMUNITY)
Admission: EM | Admit: 2017-05-06 | Discharge: 2017-05-06 | Disposition: A | Discharge status: Home / Self Care | Payer: Medicare Other | Attending: Emergency Medicine | Admitting: Emergency Medicine

## 2017-05-06 ENCOUNTER — Encounter (HOSPITAL_COMMUNITY): Payer: Self-pay

## 2017-05-06 DIAGNOSIS — R1084 Generalized abdominal pain: Secondary | ICD-10-CM

## 2017-05-06 DIAGNOSIS — R100 Acute abdomen: Secondary | ICD-10-CM | POA: Diagnosis not present

## 2017-05-06 DIAGNOSIS — R112 Nausea with vomiting, unspecified: Secondary | ICD-10-CM | POA: Diagnosis not present

## 2017-05-06 DIAGNOSIS — Z79899 Other long term (current) drug therapy: Secondary | ICD-10-CM | POA: Diagnosis not present

## 2017-05-06 DIAGNOSIS — F17200 Nicotine dependence, unspecified, uncomplicated: Secondary | ICD-10-CM | POA: Insufficient documentation

## 2017-05-06 DIAGNOSIS — R1030 Lower abdominal pain, unspecified: Secondary | ICD-10-CM | POA: Diagnosis present

## 2017-05-06 MED ORDER — ACETAMINOPHEN 325 MG PO TABS
650.0000 mg | ORAL_TABLET | Freq: Once | ORAL | Status: AC
  Administered 2017-05-06: 650 mg via ORAL
  Filled 2017-05-06: qty 2

## 2017-05-06 NOTE — Discharge Instructions (Signed)
Use Tylenol as needed for pain.
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
## 2017-05-06 NOTE — ED Triage Notes (Signed)
Pt reporting abdominal and back pain. He was discharged a few hours ago from this ER. Pt states that pain is worse and he is screaming and uncooperative. A&Ox4. Ambulatory.
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
## 2017-05-07 ENCOUNTER — Encounter (HOSPITAL_COMMUNITY): Payer: Self-pay | Admitting: *Deleted

## 2017-05-07 ENCOUNTER — Emergency Department (HOSPITAL_COMMUNITY)
Admission: EM | Admit: 2017-05-07 | Discharge: 2017-05-07 | Disposition: A | Discharge status: Home / Self Care | Payer: Medicare Other | Attending: Emergency Medicine | Admitting: Emergency Medicine

## 2017-05-07 DIAGNOSIS — R1084 Generalized abdominal pain: Secondary | ICD-10-CM | POA: Diagnosis not present

## 2017-05-07 DIAGNOSIS — Z79899 Other long term (current) drug therapy: Secondary | ICD-10-CM | POA: Diagnosis not present

## 2017-05-07 DIAGNOSIS — R109 Unspecified abdominal pain: Secondary | ICD-10-CM | POA: Insufficient documentation

## 2017-05-07 DIAGNOSIS — Z87891 Personal history of nicotine dependence: Secondary | ICD-10-CM | POA: Insufficient documentation

## 2017-05-07 DIAGNOSIS — G8929 Other chronic pain: Secondary | ICD-10-CM

## 2017-05-07 DIAGNOSIS — R1031 Right lower quadrant pain: Secondary | ICD-10-CM

## 2017-05-07 LAB — CBC WITH DIFFERENTIAL/PLATELET
Basophils Absolute: 0 10*3/uL (ref 0.0–0.1)
Basophils Relative: 0 %
Eosinophils Absolute: 0.1 10*3/uL (ref 0.0–0.7)
Eosinophils Relative: 1 %
HCT: 41.6 % (ref 39.0–52.0)
Hemoglobin: 14.4 g/dL (ref 13.0–17.0)
Lymphocytes Relative: 40 %
Lymphs Abs: 3.6 10*3/uL (ref 0.7–4.0)
MCH: 31.5 pg (ref 26.0–34.0)
MCHC: 34.6 g/dL (ref 30.0–36.0)
MCV: 91 fL (ref 78.0–100.0)
Monocytes Absolute: 0.6 10*3/uL (ref 0.1–1.0)
Monocytes Relative: 7 %
Neutro Abs: 4.7 10*3/uL (ref 1.7–7.7)
Neutrophils Relative %: 52 %
Platelets: 228 10*3/uL (ref 150–400)
RBC: 4.57 MIL/uL (ref 4.22–5.81)
RDW: 13.6 % (ref 11.5–15.5)
WBC: 9 10*3/uL (ref 4.0–10.5)

## 2017-05-07 LAB — COMPREHENSIVE METABOLIC PANEL
ALT: 16 U/L — ABNORMAL LOW (ref 17–63)
AST: 24 U/L (ref 15–41)
Albumin: 4.1 g/dL (ref 3.5–5.0)
Alkaline Phosphatase: 56 U/L (ref 38–126)
Anion gap: 7 (ref 5–15)
BUN: 10 mg/dL (ref 6–20)
CO2: 24 mmol/L (ref 22–32)
Calcium: 9.3 mg/dL (ref 8.9–10.3)
Chloride: 102 mmol/L (ref 101–111)
Creatinine, Ser: 1.15 mg/dL (ref 0.61–1.24)
GFR calc Af Amer: >60 mL/min (ref >60)
GFR calc non Af Amer: >60 mL/min (ref >60)
Glucose, Bld: 111 mg/dL — ABNORMAL HIGH (ref 65–99)
Potassium: 3.8 mmol/L (ref 3.5–5.1)
Sodium: 133 mmol/L — ABNORMAL LOW (ref 135–145)
Total Bilirubin: 0.6 mg/dL (ref 0.3–1.2)
Total Protein: 7.2 g/dL (ref 6.5–8.1)

## 2017-05-07 LAB — LIPASE, BLOOD: Lipase: 30 U/L (ref 11–51)

## 2017-05-07 MED ORDER — DICYCLOMINE HCL 10 MG PO CAPS
10.0000 mg | ORAL_CAPSULE | Freq: Once | ORAL | Status: AC
  Administered 2017-05-07: 10 mg via ORAL
  Filled 2017-05-07: qty 1

## 2017-05-07 MED ORDER — DICYCLOMINE HCL 20 MG PO TABS: 20.0000 mg | ORAL_TABLET | Freq: Two times a day (BID) | ORAL | 3 refills | Status: DC

## 2017-05-07 MED ORDER — ONDANSETRON HCL 4 MG/2ML IJ SOLN
4.0000 mg | Freq: Once | INTRAMUSCULAR | Status: AC
  Administered 2017-05-07: 4 mg via INTRAVENOUS
  Filled 2017-05-07: qty 2

## 2017-05-07 MED ORDER — HALOPERIDOL LACTATE 5 MG/ML IJ SOLN
2.0000 mg | Freq: Once | INTRAMUSCULAR | Status: AC
  Administered 2017-05-07: 2 mg via INTRAVENOUS
  Filled 2017-05-07: qty 1
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
## 2017-05-07 NOTE — ED Triage Notes (Signed)
Pt updated on Plan  For IV team to start IV.

## 2017-05-07 NOTE — ED Notes (Signed)
Declined W/C at D/C and was escorted to lobby by RN. 
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
## 2017-05-07 NOTE — ED Triage Notes (Signed)
Order placed for IV team to start  Iv.

## 2017-05-08 ENCOUNTER — Telehealth: Payer: Self-pay | Admitting: Internal Medicine

## 2017-05-08 DIAGNOSIS — K2100 Gastro-esophageal reflux disease with esophagitis, without bleeding: Secondary | ICD-10-CM

## 2017-05-08 MED ORDER — OMEPRAZOLE 20 MG PO CPDR: 20.0000 mg | DELAYED_RELEASE_CAPSULE | Freq: Every day | ORAL | 3 refills | Status: DC

## 2017-05-08 NOTE — Telephone Encounter (Signed)
Refilled omeprazole. Will forward request for cyclobenzaprine and lactulose to Dr. Doreene Burke

## 2017-05-08 NOTE — Telephone Encounter (Signed)
Patient called requesting medication refill on cyclobenzaprine (FLEXERIL) 10 MG tablet,omeprazole (PRILOSEC) 20 MG capsule,and Latulose syrup 10g/15mg  which was prescribed by another provider . Please f/up

## 2017-05-08 NOTE — Telephone Encounter (Signed)
Patient called requesting medication refill on traMADol (ULTRAM) 50 MG tablet, please f/up

## 2017-05-09 MED FILL — OMEPRAZOLE DR 20 MG CAPSULE: 20 | 30 days supply | Qty: 30 | Fill #0

## 2017-05-10 ENCOUNTER — Other Ambulatory Visit: Payer: Self-pay | Admitting: Internal Medicine

## 2017-05-10 DIAGNOSIS — R1031 Right lower quadrant pain: Principal | ICD-10-CM

## 2017-05-10 DIAGNOSIS — G8929 Other chronic pain: Secondary | ICD-10-CM

## 2017-05-10 MED ORDER — TRAMADOL HCL 50 MG PO TABS: 50.0000 mg | ORAL_TABLET | Freq: Four times a day (QID) | ORAL | 0 refills | Status: DC | PRN

## 2017-05-10 NOTE — Telephone Encounter (Signed)
Pt. Called requesting a refill on Tramadol. Please f/u with pt.  °

## 2017-05-10 NOTE — Telephone Encounter (Signed)
Refilled

## 2017-05-14 MED FILL — raNITIdine HCL 150 MG TABS: 150 | 30 days supply | Qty: 30 | Fill #2

## 2017-05-14 MED FILL — traMADol HCL 50 MG TABS: 50 | 22 days supply | Qty: 90 | Fill #0

## 2017-05-14 MED FILL — DICYCLOMINE 20 MG TABLET: 20 | 30 days supply | Qty: 60 | Fill #1

## 2017-05-16 ENCOUNTER — Encounter: Payer: Self-pay | Admitting: Internal Medicine

## 2017-05-16 ENCOUNTER — Inpatient Hospital Stay: Payer: Medicare Other | Admitting: Internal Medicine

## 2017-05-16 ENCOUNTER — Ambulatory Visit: Payer: Medicare Other | Attending: Internal Medicine | Admitting: Internal Medicine

## 2017-05-16 VITALS — BP 119/87 | HR 111 | Temp 97.8°F | Resp 18 | Ht 70.0 in | Wt 134.0 lb

## 2017-05-16 DIAGNOSIS — R0600 Dyspnea, unspecified: Secondary | ICD-10-CM | POA: Insufficient documentation

## 2017-05-16 DIAGNOSIS — M62838 Other muscle spasm: Secondary | ICD-10-CM | POA: Insufficient documentation

## 2017-05-16 DIAGNOSIS — T300 Burn of unspecified body region, unspecified degree: Secondary | ICD-10-CM | POA: Diagnosis not present

## 2017-05-16 DIAGNOSIS — K219 Gastro-esophageal reflux disease without esophagitis: Secondary | ICD-10-CM | POA: Insufficient documentation

## 2017-05-16 DIAGNOSIS — Z87442 Personal history of urinary calculi: Secondary | ICD-10-CM | POA: Insufficient documentation

## 2017-05-16 DIAGNOSIS — G8929 Other chronic pain: Secondary | ICD-10-CM | POA: Diagnosis not present

## 2017-05-16 DIAGNOSIS — K861 Other chronic pancreatitis: Secondary | ICD-10-CM | POA: Insufficient documentation

## 2017-05-16 DIAGNOSIS — R1031 Right lower quadrant pain: Secondary | ICD-10-CM | POA: Diagnosis not present

## 2017-05-16 DIAGNOSIS — M7989 Other specified soft tissue disorders: Secondary | ICD-10-CM | POA: Insufficient documentation

## 2017-05-16 MED ORDER — DICYCLOMINE HCL 20 MG PO TABS: 20.0000 mg | ORAL_TABLET | Freq: Two times a day (BID) | ORAL | 3 refills | Status: DC

## 2017-05-16 MED ORDER — CYCLOBENZAPRINE HCL 10 MG PO TABS: 10.0000 mg | ORAL_TABLET | Freq: Three times a day (TID) | ORAL | 1 refills | Status: DC | PRN

## 2017-05-16 MED ORDER — KETOROLAC TROMETHAMINE 30 MG/ML IJ SOLN
30.0000 mg | Freq: Once | INTRAMUSCULAR | Status: AC
Start: 2017-05-16 — End: 2017-05-16
  Administered 2017-05-16: 30 mg via INTRAMUSCULAR

## 2017-05-16 MED ORDER — SILVER SULFADIAZINE 1 % EX CREA: 1.0000 "application " | TOPICAL_CREAM | Freq: Every day | CUTANEOUS | 0 refills | Status: DC
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
## 2017-05-16 NOTE — Patient Instructions (Signed)

## 2017-06-05 ENCOUNTER — Emergency Department (HOSPITAL_COMMUNITY): Payer: Medicare Other

## 2017-06-05 ENCOUNTER — Encounter (HOSPITAL_COMMUNITY): Payer: Self-pay | Admitting: Emergency Medicine

## 2017-06-05 ENCOUNTER — Emergency Department (HOSPITAL_COMMUNITY)
Admission: EM | Admit: 2017-06-05 | Discharge: 2017-06-06 | Disposition: A | Discharge status: Home / Self Care | Payer: Medicare Other | Attending: Emergency Medicine | Admitting: Emergency Medicine

## 2017-06-05 DIAGNOSIS — Z9049 Acquired absence of other specified parts of digestive tract: Secondary | ICD-10-CM | POA: Diagnosis not present

## 2017-06-05 DIAGNOSIS — G8929 Other chronic pain: Secondary | ICD-10-CM | POA: Insufficient documentation

## 2017-06-05 DIAGNOSIS — Z79899 Other long term (current) drug therapy: Secondary | ICD-10-CM | POA: Diagnosis not present

## 2017-06-05 DIAGNOSIS — R109 Unspecified abdominal pain: Secondary | ICD-10-CM | POA: Diagnosis not present

## 2017-06-05 DIAGNOSIS — R1031 Right lower quadrant pain: Secondary | ICD-10-CM | POA: Insufficient documentation

## 2017-06-05 DIAGNOSIS — Z7982 Long term (current) use of aspirin: Secondary | ICD-10-CM | POA: Diagnosis not present

## 2017-06-05 LAB — URINALYSIS, ROUTINE W REFLEX MICROSCOPIC
Glucose, UA: NEGATIVE mg/dL
Ketones, ur: NEGATIVE mg/dL
Leukocytes, UA: NEGATIVE
Nitrite: NEGATIVE
Protein, ur: 30 mg/dL — AB
Specific Gravity, Urine: 1.029 (ref 1.005–1.030)
pH: 5 (ref 5.0–8.0)

## 2017-06-05 MED ORDER — KETOROLAC TROMETHAMINE 30 MG/ML IJ SOLN
30.0000 mg | Freq: Once | INTRAMUSCULAR | Status: AC
  Administered 2017-06-05: 30 mg via INTRAVENOUS
  Filled 2017-06-05: qty 1

## 2017-06-05 MED ORDER — SODIUM CHLORIDE 0.9 % IV BOLUS (SEPSIS)
1000.0000 mL | Freq: Once | INTRAVENOUS | Status: AC
  Administered 2017-06-05: 1000 mL via INTRAVENOUS

## 2017-06-05 NOTE — ED Notes (Signed)
Pt transported to xray 

## 2017-06-05 NOTE — Discharge Instructions (Signed)
Tonight your x-ray showed that you do not have a bowel obstruction or constipation.  He was given IV fluids and Toradol.  The urine shows no sign of infection.  Please continue your home medication.  Follow-up with your primary care physician
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
## 2017-06-05 NOTE — ED Triage Notes (Signed)
Pt. Stated, [name] got pain on my rt. Side (flank area) started this morning.

## 2017-06-13 ENCOUNTER — Telehealth: Payer: Self-pay | Admitting: Internal Medicine

## 2017-06-13 NOTE — Telephone Encounter (Signed)
Pt calling to request refill of Tramadol. Please f/u. Thank you.
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
## 2017-06-19 ENCOUNTER — Emergency Department (HOSPITAL_COMMUNITY)
Admission: EM | Admit: 2017-06-19 | Discharge: 2017-06-19 | Disposition: A | Discharge status: Home / Self Care | Payer: Medicare Other | Attending: Emergency Medicine | Admitting: Emergency Medicine

## 2017-06-19 ENCOUNTER — Encounter (HOSPITAL_COMMUNITY): Payer: Self-pay | Admitting: Emergency Medicine

## 2017-06-19 DIAGNOSIS — Z79899 Other long term (current) drug therapy: Secondary | ICD-10-CM | POA: Diagnosis not present

## 2017-06-19 DIAGNOSIS — Z87891 Personal history of nicotine dependence: Secondary | ICD-10-CM | POA: Insufficient documentation

## 2017-06-19 DIAGNOSIS — R252 Cramp and spasm: Secondary | ICD-10-CM | POA: Diagnosis not present

## 2017-06-19 DIAGNOSIS — R109 Unspecified abdominal pain: Secondary | ICD-10-CM

## 2017-06-19 DIAGNOSIS — R197 Diarrhea, unspecified: Secondary | ICD-10-CM

## 2017-06-19 DIAGNOSIS — G8929 Other chronic pain: Secondary | ICD-10-CM | POA: Diagnosis not present

## 2017-06-19 DIAGNOSIS — R112 Nausea with vomiting, unspecified: Secondary | ICD-10-CM | POA: Diagnosis not present

## 2017-06-19 DIAGNOSIS — R1084 Generalized abdominal pain: Secondary | ICD-10-CM | POA: Diagnosis present

## 2017-06-19 LAB — COMPREHENSIVE METABOLIC PANEL
ALT: 35 U/L (ref 17–63)
AST: 35 U/L (ref 15–41)
Albumin: 4.5 g/dL (ref 3.5–5.0)
Alkaline Phosphatase: 76 U/L (ref 38–126)
Anion gap: 14 (ref 5–15)
BUN: 20 mg/dL (ref 6–20)
CO2: 19 mmol/L — ABNORMAL LOW (ref 22–32)
Calcium: 9.6 mg/dL (ref 8.9–10.3)
Chloride: 100 mmol/L — ABNORMAL LOW (ref 101–111)
Creatinine, Ser: 1.23 mg/dL (ref 0.61–1.24)
GFR calc Af Amer: >60 mL/min (ref >60)
GFR calc non Af Amer: >60 mL/min (ref >60)
Glucose, Bld: 159 mg/dL — ABNORMAL HIGH (ref 65–99)
Potassium: 3.3 mmol/L — ABNORMAL LOW (ref 3.5–5.1)
Sodium: 133 mmol/L — ABNORMAL LOW (ref 135–145)
Total Bilirubin: 0.7 mg/dL (ref 0.3–1.2)
Total Protein: 8 g/dL (ref 6.5–8.1)

## 2017-06-19 LAB — URINALYSIS, ROUTINE W REFLEX MICROSCOPIC
Bacteria, UA: NONE SEEN
Glucose, UA: NEGATIVE mg/dL
Ketones, ur: 5 mg/dL — AB
Leukocytes, UA: NEGATIVE
Nitrite: NEGATIVE
Protein, ur: 30 mg/dL — AB
Specific Gravity, Urine: 1.035 — ABNORMAL HIGH (ref 1.005–1.030)
pH: 5 (ref 5.0–8.0)

## 2017-06-19 LAB — CBC
HCT: 44.7 % (ref 39.0–52.0)
Hemoglobin: 15.8 g/dL (ref 13.0–17.0)
MCH: 31.8 pg (ref 26.0–34.0)
MCHC: 35.3 g/dL (ref 30.0–36.0)
MCV: 89.9 fL (ref 78.0–100.0)
Platelets: 233 10*3/uL (ref 150–400)
RBC: 4.97 MIL/uL (ref 4.22–5.81)
RDW: 13.3 % (ref 11.5–15.5)
WBC: 9.9 10*3/uL (ref 4.0–10.5)

## 2017-06-19 LAB — LIPASE, BLOOD: Lipase: 42 U/L (ref 11–51)

## 2017-06-19 LAB — MAGNESIUM: Magnesium: 2 mg/dL (ref 1.7–2.4)

## 2017-06-19 LAB — CK: Total CK: 274 U/L (ref 49–397)

## 2017-06-19 MED ORDER — LOPERAMIDE HCL 2 MG PO CAPS: 2.0000 mg | ORAL_CAPSULE | Freq: Four times a day (QID) | ORAL | 0 refills | Status: DC | PRN

## 2017-06-19 MED ORDER — LORAZEPAM 2 MG/ML IJ SOLN
1.0000 mg | Freq: Once | INTRAMUSCULAR | Status: AC
  Administered 2017-06-19: 1 mg via INTRAVENOUS
  Filled 2017-06-19: qty 1

## 2017-06-19 MED ORDER — SODIUM CHLORIDE 0.9 % IV BOLUS (SEPSIS)
1000.0000 mL | Freq: Once | INTRAVENOUS | Status: AC
  Administered 2017-06-19: 1000 mL via INTRAVENOUS

## 2017-06-19 MED ORDER — METHOCARBAMOL 1000 MG/10ML IJ SOLN: 500.0000 mg | Freq: Once | INTRAMUSCULAR | Status: DC

## 2017-06-19 MED ORDER — METHOCARBAMOL 1000 MG/10ML IJ SOLN
500.0000 mg | Freq: Once | INTRAVENOUS | Status: DC
  Filled 2017-06-19: qty 5

## 2017-06-19 MED ORDER — ONDANSETRON 4 MG PO TBDP: 4.0000 mg | ORAL_TABLET | Freq: Three times a day (TID) | ORAL | 0 refills | Status: DC | PRN

## 2017-06-19 MED ORDER — POTASSIUM CHLORIDE CRYS ER 20 MEQ PO TBCR
40.0000 meq | EXTENDED_RELEASE_TABLET | Freq: Once | ORAL | Status: AC
  Administered 2017-06-19: 40 meq via ORAL
  Filled 2017-06-19: qty 2

## 2017-06-19 NOTE — Telephone Encounter (Signed)
Patient is following up on request for tramadol  Please advised

## 2017-06-19 NOTE — ED Triage Notes (Signed)
Pt reports R flank pain, present X2 weeks with N/V. Pt has care plan for same complaints.

## 2017-06-19 NOTE — ED Notes (Signed)
Pt understood dc material. Sleeping when RN entered room. Scripts given at [employer]
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
## 2017-06-22 ENCOUNTER — Other Ambulatory Visit: Payer: Self-pay | Admitting: Internal Medicine

## 2017-06-22 DIAGNOSIS — R1031 Right lower quadrant pain: Principal | ICD-10-CM

## 2017-06-22 DIAGNOSIS — G8929 Other chronic pain: Secondary | ICD-10-CM

## 2017-06-22 MED ORDER — TRAMADOL HCL 50 MG PO TABS: 50.0000 mg | ORAL_TABLET | Freq: Four times a day (QID) | ORAL | 0 refills | Status: DC | PRN

## 2017-06-22 MED FILL — traMADol HCL 50 MG TABS: 50 | 22 days supply | Qty: 90 | Fill #0

## 2017-06-22 NOTE — Telephone Encounter (Signed)
Done

## 2017-07-23 ENCOUNTER — Telehealth: Payer: Self-pay | Admitting: Internal Medicine

## 2017-07-23 NOTE — Telephone Encounter (Signed)
Pt called to request a refill for  cyclobenzaprine (FLEXERIL) 10 MG tablet  traMADol (ULTRAM) 50 MG tablet ondansetron (ZOFRAN ODT) 4 MG disintegrating tablet omeprazole (PRILOSEC) 20 MG capsule   Please follow up

## 2017-07-25 ENCOUNTER — Other Ambulatory Visit: Payer: Self-pay | Admitting: *Deleted

## 2017-07-25 DIAGNOSIS — G8929 Other chronic pain: Secondary | ICD-10-CM

## 2017-07-25 DIAGNOSIS — R1031 Right lower quadrant pain: Principal | ICD-10-CM

## 2017-07-25 MED ORDER — TRAMADOL HCL 50 MG PO TABS: 50.0000 mg | ORAL_TABLET | Freq: Four times a day (QID) | ORAL | 0 refills | Status: DC | PRN

## 2017-07-25 MED FILL — raNITIdine HCL 150 MG TABS: 150 | 30 days supply | Qty: 30 | Fill #3

## 2017-07-25 MED FILL — DICYCLOMINE 20 MG TABLET: 20 | 30 days supply | Qty: 60 | Fill #0

## 2017-07-25 MED FILL — SSD 1% CREAM: 1 | 25 days supply | Qty: 50 | Fill #0

## 2017-07-25 MED FILL — CARAFATE 1 GM/10 ML SUSP: 1 | 21 days supply | Qty: 420 | Fill #2

## 2017-07-25 MED FILL — CYCLOBENZAPRINE 10 MG TAB: 10 | 20 days supply | Qty: 60 | Fill #0

## 2017-07-25 MED FILL — traMADol HCL 50 MG TABS: 50 | 23 days supply | Qty: 90 | Fill #0

## 2017-07-25 MED FILL — OMEPRAZOLE DR 20 MG CAPSULE: 20 | 30 days supply | Qty: 30 | Fill #1

## 2017-07-25 NOTE — Telephone Encounter (Signed)
Last filled 06/22/17

## 2017-08-22 ENCOUNTER — Telehealth: Payer: Self-pay | Admitting: Internal Medicine

## 2017-08-22 NOTE — Telephone Encounter (Signed)
Pt came into office requesting medication refill on traMADol (ULTRAM) 50 MG tablet. Please f/up

## 2017-08-23 ENCOUNTER — Other Ambulatory Visit: Payer: Self-pay | Admitting: Internal Medicine

## 2017-08-23 DIAGNOSIS — G8929 Other chronic pain: Secondary | ICD-10-CM

## 2017-08-23 DIAGNOSIS — R1031 Right lower quadrant pain: Principal | ICD-10-CM

## 2017-08-23 MED ORDER — TRAMADOL HCL 50 MG PO TABS: 50.0000 mg | ORAL_TABLET | Freq: Four times a day (QID) | ORAL | 0 refills | Status: DC | PRN

## 2017-08-27 MED FILL — traMADol HCL 50 MG TABS: 50 | 22 days supply | Qty: 90 | Fill #0

## 2017-09-19 ENCOUNTER — Other Ambulatory Visit: Payer: Self-pay | Admitting: Internal Medicine

## 2017-09-19 DIAGNOSIS — K2100 Gastro-esophageal reflux disease with esophagitis, without bleeding: Secondary | ICD-10-CM

## 2017-09-19 DIAGNOSIS — G8929 Other chronic pain: Secondary | ICD-10-CM

## 2017-09-19 DIAGNOSIS — M62838 Other muscle spasm: Secondary | ICD-10-CM

## 2017-09-19 DIAGNOSIS — R1031 Right lower quadrant pain: Principal | ICD-10-CM

## 2017-09-19 MED ORDER — CYCLOBENZAPRINE HCL 10 MG PO TABS: 10.0000 mg | ORAL_TABLET | Freq: Three times a day (TID) | ORAL | 0 refills | Status: DC | PRN

## 2017-09-19 MED ORDER — DICYCLOMINE HCL 20 MG PO TABS: 20.0000 mg | ORAL_TABLET | Freq: Two times a day (BID) | ORAL | 0 refills | Status: DC

## 2017-09-19 MED ORDER — OMEPRAZOLE 20 MG PO CPDR: 20.0000 mg | DELAYED_RELEASE_CAPSULE | Freq: Every day | ORAL | 0 refills | Status: DC

## 2017-09-19 MED FILL — OMEPRAZOLE DR 20 MG CAPSULE: 20 | 30 days supply | Qty: 30 | Fill #0

## 2017-09-19 MED FILL — DICYCLOMINE 20 MG TABLET: 20 | 30 days supply | Qty: 60 | Fill #0

## 2017-09-19 NOTE — Telephone Encounter (Signed)
Patient called requesting medication on all current medications, please f/up

## 2017-09-19 NOTE — Telephone Encounter (Signed)
Pt called requesting medication refill on traMADol (ULTRAM) 50 MG tablet. Please f/up

## 2017-09-19 NOTE — Telephone Encounter (Signed)
Refilled chronic medications that have been prescribed by Dr. Doreene Burke.

## 2017-09-24 ENCOUNTER — Other Ambulatory Visit: Payer: Self-pay | Admitting: *Deleted

## 2017-09-24 ENCOUNTER — Telehealth: Payer: Self-pay | Admitting: Internal Medicine

## 2017-09-24 DIAGNOSIS — M62838 Other muscle spasm: Secondary | ICD-10-CM

## 2017-09-24 DIAGNOSIS — G8929 Other chronic pain: Secondary | ICD-10-CM

## 2017-09-24 DIAGNOSIS — R1031 Right lower quadrant pain: Secondary | ICD-10-CM

## 2017-09-24 MED ORDER — TRAMADOL HCL 50 MG PO TABS: 50.0000 mg | ORAL_TABLET | Freq: Four times a day (QID) | ORAL | 0 refills | Status: DC | PRN

## 2017-09-24 MED FILL — traMADol HCL 50 MG TABS: 50 | 22 days supply | Qty: 90 | Fill #0

## 2017-09-24 NOTE — Telephone Encounter (Signed)
Refilled. Last filled on 08/23/17

## 2017-09-24 NOTE — Telephone Encounter (Signed)
Pt called requesting medication refill on
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
## 2017-10-16 ENCOUNTER — Telehealth: Payer: Self-pay | Admitting: Internal Medicine

## 2017-10-16 NOTE — Telephone Encounter (Signed)
Patient called and requested all medication to be refilled. Please fu

## 2017-10-22 ENCOUNTER — Other Ambulatory Visit: Payer: Self-pay | Admitting: Internal Medicine

## 2017-10-22 DIAGNOSIS — R1031 Right lower quadrant pain: Secondary | ICD-10-CM

## 2017-10-22 DIAGNOSIS — K2100 Gastro-esophageal reflux disease with esophagitis, without bleeding: Secondary | ICD-10-CM

## 2017-10-22 DIAGNOSIS — M62838 Other muscle spasm: Secondary | ICD-10-CM

## 2017-10-22 DIAGNOSIS — G8929 Other chronic pain: Secondary | ICD-10-CM

## 2017-10-22 MED ORDER — CYCLOBENZAPRINE HCL 10 MG PO TABS: 10.0000 mg | ORAL_TABLET | Freq: Three times a day (TID) | ORAL | 0 refills | Status: DC | PRN

## 2017-10-22 MED ORDER — OMEPRAZOLE 20 MG PO CPDR: 20.0000 mg | DELAYED_RELEASE_CAPSULE | Freq: Every day | ORAL | 0 refills | Status: DC

## 2017-10-22 MED ORDER — DICYCLOMINE HCL 20 MG PO TABS: 20.0000 mg | ORAL_TABLET | Freq: Two times a day (BID) | ORAL | 0 refills | Status: DC

## 2017-10-22 MED ORDER — TRAMADOL HCL 50 MG PO TABS: 50.0000 mg | ORAL_TABLET | Freq: Four times a day (QID) | ORAL | 0 refills | Status: DC | PRN

## 2017-10-22 NOTE — Telephone Encounter (Signed)
Refilled

## 2017-10-24 MED FILL — traMADol HCL 50 MG TABS: 50 | 22 days supply | Qty: 90 | Fill #0

## 2017-11-07 ENCOUNTER — Encounter: Payer: Self-pay | Admitting: Internal Medicine

## 2017-11-07 ENCOUNTER — Ambulatory Visit: Payer: Medicare Other | Attending: Internal Medicine | Admitting: Internal Medicine

## 2017-11-07 VITALS — BP 108/60 | HR 79 | Temp 98.6°F | Resp 18 | Ht 70.0 in | Wt 143.0 lb

## 2017-11-07 DIAGNOSIS — Z888 Allergy status to other drugs, medicaments and biological substances status: Secondary | ICD-10-CM | POA: Insufficient documentation

## 2017-11-07 DIAGNOSIS — M62838 Other muscle spasm: Secondary | ICD-10-CM | POA: Diagnosis not present

## 2017-11-07 DIAGNOSIS — R0600 Dyspnea, unspecified: Secondary | ICD-10-CM | POA: Insufficient documentation

## 2017-11-07 DIAGNOSIS — F17210 Nicotine dependence, cigarettes, uncomplicated: Secondary | ICD-10-CM | POA: Diagnosis not present

## 2017-11-07 DIAGNOSIS — K861 Other chronic pancreatitis: Secondary | ICD-10-CM | POA: Insufficient documentation

## 2017-11-07 DIAGNOSIS — Z720 Tobacco use: Secondary | ICD-10-CM | POA: Diagnosis not present

## 2017-11-07 DIAGNOSIS — Z79899 Other long term (current) drug therapy: Secondary | ICD-10-CM | POA: Diagnosis not present

## 2017-11-07 DIAGNOSIS — Z79891 Long term (current) use of opiate analgesic: Secondary | ICD-10-CM | POA: Insufficient documentation

## 2017-11-07 DIAGNOSIS — K859 Acute pancreatitis without necrosis or infection, unspecified: Secondary | ICD-10-CM | POA: Insufficient documentation

## 2017-11-07 DIAGNOSIS — K219 Gastro-esophageal reflux disease without esophagitis: Secondary | ICD-10-CM | POA: Insufficient documentation

## 2017-11-07 DIAGNOSIS — R1031 Right lower quadrant pain: Secondary | ICD-10-CM | POA: Diagnosis not present

## 2017-11-07 DIAGNOSIS — Z800 Family history of malignant neoplasm of digestive organs: Secondary | ICD-10-CM | POA: Insufficient documentation

## 2017-11-07 DIAGNOSIS — Z87442 Personal history of urinary calculi: Secondary | ICD-10-CM | POA: Diagnosis not present

## 2017-11-07 DIAGNOSIS — G8929 Other chronic pain: Secondary | ICD-10-CM | POA: Diagnosis not present

## 2017-11-07 DIAGNOSIS — Z885 Allergy status to narcotic agent status: Secondary | ICD-10-CM | POA: Diagnosis not present

## 2017-11-07 DIAGNOSIS — M7989 Other specified soft tissue disorders: Secondary | ICD-10-CM | POA: Insufficient documentation

## 2017-11-07 DIAGNOSIS — R141 Gas pain: Secondary | ICD-10-CM

## 2017-11-07 MED ORDER — CYCLOBENZAPRINE HCL 10 MG PO TABS: 10.0000 mg | ORAL_TABLET | Freq: Three times a day (TID) | ORAL | 3 refills | Status: DC | PRN

## 2017-11-07 MED ORDER — DICYCLOMINE HCL 20 MG PO TABS: 20.0000 mg | ORAL_TABLET | Freq: Two times a day (BID) | ORAL | 3 refills | Status: DC

## 2017-11-07 MED ORDER — TRAMADOL HCL 50 MG PO TABS: 50.0000 mg | ORAL_TABLET | Freq: Four times a day (QID) | ORAL | 0 refills | Status: DC | PRN

## 2017-11-07 MED FILL — DICYCLOMINE 20 MG TABLET: 20 | 30 days supply | Qty: 60 | Fill #0

## 2017-11-07 MED FILL — CYCLOBENZAPRINE 10 MG TAB: 10 | 20 days supply | Qty: 60 | Fill #0

## 2017-11-07 NOTE — Patient Instructions (Signed)
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
## 2017-11-14 MED FILL — traMADol HCL 50 MG TABS: 50 | 22 days supply | Qty: 90 | Fill #0

## 2017-12-05 ENCOUNTER — Telehealth: Payer: Self-pay | Admitting: Internal Medicine

## 2017-12-05 DIAGNOSIS — G8929 Other chronic pain: Secondary | ICD-10-CM

## 2017-12-05 DIAGNOSIS — R1031 Right lower quadrant pain: Principal | ICD-10-CM

## 2017-12-05 NOTE — Telephone Encounter (Signed)
Pt called to request a refill for traMADol (ULTRAM) 50 MG tablet Please sent it to [facility] pharmacy, please follow up

## 2017-12-06 ENCOUNTER — Other Ambulatory Visit: Payer: Self-pay | Admitting: *Deleted

## 2017-12-06 ENCOUNTER — Other Ambulatory Visit (HOSPITAL_COMMUNITY)
Admission: RE | Admit: 2017-12-06 | Discharge: 2017-12-06 | Disposition: A | Discharge status: Home / Self Care | Payer: Medicare Other | Source: Ambulatory Visit | Attending: Family Medicine | Admitting: Family Medicine

## 2017-12-06 DIAGNOSIS — M545 Low back pain, unspecified: Secondary | ICD-10-CM

## 2017-12-06 MED ORDER — TRAMADOL HCL 50 MG PO TABS: 50.0000 mg | ORAL_TABLET | Freq: Two times a day (BID) | ORAL | 0 refills | Status: DC | PRN

## 2017-12-06 MED FILL — traMADol HCL 50 MG TABS: 50 | 30 days supply | Qty: 60 | Fill #0

## 2017-12-06 NOTE — Telephone Encounter (Signed)
Pt was called and informed to return phone call.

## 2017-12-06 NOTE — Progress Notes (Signed)
Patients urine is being sent off for STI testing per covering provider.

## 2017-12-06 NOTE — Telephone Encounter (Signed)
Ready for pick up

## 2017-12-07 LAB — URINE CYTOLOGY ANCILLARY ONLY
Chlamydia: NEGATIVE
Neisseria Gonorrhea: NEGATIVE
Trichomonas: NEGATIVE

## 2017-12-10 LAB — URINE CYTOLOGY ANCILLARY ONLY
Bacterial vaginitis: NEGATIVE
Candida vaginitis: NEGATIVE

## 2017-12-12 ENCOUNTER — Telehealth: Payer: Self-pay

## 2017-12-12 NOTE — Telephone Encounter (Signed)
Pt was called and a message was left informing pt to return phone call for lab results.   If pt returns phone call please inform pt that STD labs are all normal(negative).

## 2017-12-27 ENCOUNTER — Encounter (HOSPITAL_COMMUNITY): Payer: Self-pay | Admitting: *Deleted

## 2017-12-27 ENCOUNTER — Emergency Department (HOSPITAL_COMMUNITY)
Admission: EM | Admit: 2017-12-27 | Discharge: 2017-12-27 | Disposition: A | Discharge status: Home / Self Care | Payer: Medicare Other | Attending: Emergency Medicine | Admitting: Emergency Medicine

## 2017-12-27 ENCOUNTER — Other Ambulatory Visit: Payer: Self-pay

## 2017-12-27 ENCOUNTER — Emergency Department (HOSPITAL_COMMUNITY): Payer: Medicare Other

## 2017-12-27 DIAGNOSIS — Z79899 Other long term (current) drug therapy: Secondary | ICD-10-CM | POA: Insufficient documentation

## 2017-12-27 DIAGNOSIS — G8929 Other chronic pain: Secondary | ICD-10-CM | POA: Diagnosis not present

## 2017-12-27 DIAGNOSIS — K2970 Gastritis, unspecified, without bleeding: Secondary | ICD-10-CM | POA: Diagnosis not present

## 2017-12-27 DIAGNOSIS — R112 Nausea with vomiting, unspecified: Secondary | ICD-10-CM | POA: Insufficient documentation

## 2017-12-27 DIAGNOSIS — Z87891 Personal history of nicotine dependence: Secondary | ICD-10-CM | POA: Insufficient documentation

## 2017-12-27 DIAGNOSIS — A084 Viral intestinal infection, unspecified: Secondary | ICD-10-CM | POA: Diagnosis not present

## 2017-12-27 DIAGNOSIS — R1011 Right upper quadrant pain: Secondary | ICD-10-CM

## 2017-12-27 DIAGNOSIS — K56609 Unspecified intestinal obstruction, unspecified as to partial versus complete obstruction: Secondary | ICD-10-CM | POA: Insufficient documentation

## 2017-12-27 DIAGNOSIS — K432 Incisional hernia without obstruction or gangrene: Secondary | ICD-10-CM | POA: Insufficient documentation

## 2017-12-27 DIAGNOSIS — R197 Diarrhea, unspecified: Secondary | ICD-10-CM

## 2017-12-27 DIAGNOSIS — R109 Unspecified abdominal pain: Secondary | ICD-10-CM | POA: Diagnosis not present

## 2017-12-27 LAB — URINALYSIS, ROUTINE W REFLEX MICROSCOPIC
Bacteria, UA: NONE SEEN
Glucose, UA: NEGATIVE mg/dL
Hgb urine dipstick: NEGATIVE
Ketones, ur: 5 mg/dL — AB
Leukocytes, UA: NEGATIVE
Nitrite: NEGATIVE
Protein, ur: 30 mg/dL — AB
Specific Gravity, Urine: 1.035 — ABNORMAL HIGH (ref 1.005–1.030)
Squamous Epithelial / LPF: NONE SEEN
pH: 5 (ref 5.0–8.0)

## 2017-12-27 LAB — COMPREHENSIVE METABOLIC PANEL
ALT: 28 U/L (ref 17–63)
AST: 27 U/L (ref 15–41)
Albumin: 4 g/dL (ref 3.5–5.0)
Alkaline Phosphatase: 64 U/L (ref 38–126)
Anion gap: 9 (ref 5–15)
BUN: 5 mg/dL — ABNORMAL LOW (ref 6–20)
CO2: 23 mmol/L (ref 22–32)
Calcium: 9.1 mg/dL (ref 8.9–10.3)
Chloride: 102 mmol/L (ref 101–111)
Creatinine, Ser: 1.05 mg/dL (ref 0.61–1.24)
GFR calc Af Amer: >60 mL/min (ref >60)
GFR calc non Af Amer: >60 mL/min (ref >60)
Glucose, Bld: 102 mg/dL — ABNORMAL HIGH (ref 65–99)
Potassium: 4.4 mmol/L (ref 3.5–5.1)
Sodium: 134 mmol/L — ABNORMAL LOW (ref 135–145)
Total Bilirubin: 0.9 mg/dL (ref 0.3–1.2)
Total Protein: 7.2 g/dL (ref 6.5–8.1)

## 2017-12-27 LAB — CBC
HCT: 40.7 % (ref 39.0–52.0)
Hemoglobin: 14.1 g/dL (ref 13.0–17.0)
MCH: 32.2 pg (ref 26.0–34.0)
MCHC: 34.6 g/dL (ref 30.0–36.0)
MCV: 92.9 fL (ref 78.0–100.0)
Platelets: 268 10*3/uL (ref 150–400)
RBC: 4.38 MIL/uL (ref 4.22–5.81)
RDW: 14.1 % (ref 11.5–15.5)
WBC: 6.6 10*3/uL (ref 4.0–10.5)

## 2017-12-27 LAB — LIPASE, BLOOD: Lipase: 32 U/L (ref 11–51)

## 2017-12-27 MED ORDER — ONDANSETRON 4 MG PO TBDP: 4.0000 mg | ORAL_TABLET | Freq: Three times a day (TID) | ORAL | 0 refills | Status: DC | PRN

## 2017-12-27 MED ORDER — KETOROLAC TROMETHAMINE 30 MG/ML IJ SOLN
30.0000 mg | Freq: Once | INTRAMUSCULAR | Status: AC
  Administered 2017-12-27: 30 mg via INTRAVENOUS
  Filled 2017-12-27: qty 1

## 2017-12-27 MED ORDER — FAMOTIDINE IN NACL 20-0.9 MG/50ML-% IV SOLN
20.0000 mg | Freq: Once | INTRAVENOUS | Status: AC
  Administered 2017-12-27: 20 mg via INTRAVENOUS
  Filled 2017-12-27: qty 50

## 2017-12-27 MED ORDER — SODIUM CHLORIDE 0.9 % IV BOLUS (SEPSIS)
1000.0000 mL | Freq: Once | INTRAVENOUS | Status: AC
  Administered 2017-12-27: 1000 mL via INTRAVENOUS

## 2017-12-27 MED ORDER — GI COCKTAIL ~~LOC~~
30.0000 mL | Freq: Once | ORAL | Status: AC
  Administered 2017-12-27: 30 mL via ORAL
  Filled 2017-12-27: qty 30

## 2017-12-27 MED ORDER — RANITIDINE HCL 150 MG PO TABS: 150.0000 mg | ORAL_TABLET | Freq: Two times a day (BID) | ORAL | 0 refills | Status: DC

## 2017-12-27 MED ORDER — ONDANSETRON HCL 4 MG/2ML IJ SOLN
4.0000 mg | Freq: Once | INTRAMUSCULAR | Status: AC
Start: 2017-12-27 — End: 2017-12-27
  Administered 2017-12-27: 4 mg via INTRAVENOUS
  Filled 2017-12-27: qty 2

## 2017-12-27 MED FILL — raNITIdine HCL 150 MG TABS: 150 | 15 days supply | Qty: 30 | Fill #0

## 2017-12-27 MED FILL — CYCLOBENZAPRINE 10 MG TAB: 10 | 20 days supply | Qty: 60 | Fill #1

## 2017-12-27 MED FILL — ONDANSETRON ODT 4 MG TABLET: 4 | 5 days supply | Qty: 15 | Fill #0

## 2017-12-27 NOTE — Discharge Instructions (Signed)
Your abdominal pain is likely from either the hernia you have, a viral illness from the expired baby ruth bars, or could be from gastritis or an ulcer. You will need to take zantac as directed, and avoid spicy/fatty/acidic foods, avoid soda/coffee/tea/alcohol. Avoid laying down flat within 30 minutes of eating. Avoid NSAIDs like ibuprofen/aleve/motrin/etc on an empty stomach. May consider using over the counter tums/maalox as needed for additional relief. Use zofran as directed as needed for nausea. Use tylenol as needed for pain. Follow the BRAT diet listed below to help with diarrhea. Follow up with your regular doctor in 5-7 days for recheck of symptoms. Return to the ER for changes or worsening symptoms.  Abdominal (belly) pain can be caused by many things. Your caregiver performed an examination and possibly ordered blood/urine tests and imaging (CT scan, x-rays, ultrasound). Many cases can be observed and treated at home after initial evaluation in the emergency department. Even though you are being discharged home, abdominal pain can be unpredictable. Therefore, you need a repeated exam if your pain does not resolve, returns, or worsens. Most patients with abdominal pain don't have to be admitted to the hospital or have surgery, but serious problems like appendicitis and gallbladder attacks can start out as nonspecific pain. Many abdominal conditions cannot be diagnosed in one visit, so follow-up evaluations are very important. SEEK IMMEDIATE MEDICAL ATTENTION IF YOU DEVELOP ANY OF THE FOLLOWING SYMPTOMS: The pain does not go away or becomes severe.  A temperature above 101 develops.  Repeated vomiting occurs (multiple episodes).  The pain becomes localized to portions of the abdomen. The right side could possibly be appendicitis. In an adult, the left lower portion of the abdomen could be colitis or diverticulitis.  Blood is being passed in stools or vomit (bright red or black tarry stools).  Return  also if you develop chest pain, difficulty breathing, dizziness or fainting, or become confused, poorly responsive, or inconsolable (young children). The constipation stays for more than 4 days.  There is belly (abdominal) or rectal pain.  You do not seem to be getting better.
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
## 2017-12-27 NOTE — ED Triage Notes (Addendum)
Pt reports RUQ pain x 1 week. Reports n/v/d. Pt very uncomfortable at triage.

## 2017-12-31 ENCOUNTER — Telehealth: Payer: Self-pay | Admitting: Internal Medicine

## 2017-12-31 NOTE — Telephone Encounter (Signed)
Pt. Called requesting a refill on his Tramadol. Pt. Uses [facility] pharmacy. Please f/u

## 2018-01-01 ENCOUNTER — Other Ambulatory Visit: Payer: Self-pay | Admitting: Family Medicine

## 2018-01-01 DIAGNOSIS — G8929 Other chronic pain: Secondary | ICD-10-CM

## 2018-01-01 DIAGNOSIS — R1031 Right lower quadrant pain: Principal | ICD-10-CM

## 2018-01-02 ENCOUNTER — Telehealth: Payer: Self-pay | Admitting: Internal Medicine

## 2018-01-02 NOTE — Telephone Encounter (Signed)
Pt came to the office to request traMADol (ULTRAM) 50 MG tablet Please sent it to [facility] pharmacy plse make sure you sent 90 pills for him please follow up

## 2018-01-04 ENCOUNTER — Other Ambulatory Visit: Payer: Self-pay | Admitting: Internal Medicine

## 2018-01-04 MED FILL — traMADol HCL 50 MG TABS: 50 | 30 days supply | Qty: 60 | Fill #0

## 2018-01-04 NOTE — Telephone Encounter (Signed)
Refilled on 01/01/2018
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
## 2018-02-06 ENCOUNTER — Telehealth: Payer: Self-pay | Admitting: Internal Medicine

## 2018-02-06 DIAGNOSIS — M62838 Other muscle spasm: Secondary | ICD-10-CM

## 2018-02-06 DIAGNOSIS — R1031 Right lower quadrant pain: Secondary | ICD-10-CM

## 2018-02-06 DIAGNOSIS — G8929 Other chronic pain: Secondary | ICD-10-CM

## 2018-02-06 NOTE — Telephone Encounter (Signed)
Pt came in to request a refill on  -traMADol (ULTRAM) 50 MG tablet [[id number]]  -cyclobenzaprine (FLEXERIL) 10 MG tablet [[id number]]  -dicyclomine (BENTYL) 20 MG tablet [[id number]]  Patient requested for medications to be sent to [facility] pharmacy.

## 2018-02-06 NOTE — Telephone Encounter (Signed)
Patient called and requested for listed medications to be refilled.  traMADol (ULTRAM) 50 MG tablet [[id number]]  cyclobenzaprine (FLEXERIL) 10 MG tablet [[id number]]  dicyclomine (BENTYL) 20 MG tablet [[id number]]  Patient requested for medications to be sent to [facility] pharmacy.

## 2018-02-08 MED ORDER — DICYCLOMINE HCL 20 MG PO TABS: 20.0000 mg | ORAL_TABLET | Freq: Two times a day (BID) | ORAL | 3 refills | Status: DC

## 2018-02-08 MED ORDER — CYCLOBENZAPRINE HCL 10 MG PO TABS: 10.0000 mg | ORAL_TABLET | Freq: Three times a day (TID) | ORAL | 3 refills | Status: DC | PRN

## 2018-02-08 NOTE — Telephone Encounter (Signed)
Refilled cyclobenzaprine and dicyclomine.

## 2018-02-08 NOTE — Addendum Note (Signed)
Addended by: Charlott Rakes on: 02/08/2018 04:59 PM   Modules accepted: Orders

## 2018-02-11 MED FILL — DICYCLOMINE 20 MG TABLET: 20 | 30 days supply | Qty: 60 | Fill #0

## 2018-02-11 MED FILL — CYCLOBENZAPRINE 10 MG TAB: 10 | 20 days supply | Qty: 60 | Fill #0

## 2018-02-11 NOTE — Telephone Encounter (Signed)
Patient was called and informed of medication being refilled . Patient inquired about his Tramadol refill.

## 2018-02-12 NOTE — Telephone Encounter (Signed)
That will have to be addressed at an OV

## 2018-02-13 ENCOUNTER — Other Ambulatory Visit: Payer: Self-pay | Admitting: Family Medicine

## 2018-02-13 DIAGNOSIS — G8929 Other chronic pain: Secondary | ICD-10-CM

## 2018-02-13 DIAGNOSIS — R1031 Right lower quadrant pain: Principal | ICD-10-CM

## 2018-02-13 MED ORDER — TRAMADOL HCL 50 MG PO TABS: ORAL_TABLET | ORAL | 0 refills | Status: DC

## 2018-02-13 MED FILL — traMADol HCL 50 MG TABS: 50 | 7 days supply | Qty: 15 | Fill #0

## 2018-02-13 NOTE — Telephone Encounter (Signed)
Patient was called and there was no voicemail set up to leave a message. 
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
## 2018-02-28 ENCOUNTER — Ambulatory Visit: Payer: Medicare Other | Attending: Internal Medicine | Admitting: Family Medicine

## 2018-02-28 VITALS — BP 128/86 | HR 109 | Temp 98.0°F | Resp 16 | Wt 133.8 lb

## 2018-02-28 DIAGNOSIS — Z760 Encounter for issue of repeat prescription: Secondary | ICD-10-CM | POA: Diagnosis not present

## 2018-02-28 DIAGNOSIS — M5442 Lumbago with sciatica, left side: Secondary | ICD-10-CM | POA: Insufficient documentation

## 2018-02-28 DIAGNOSIS — R109 Unspecified abdominal pain: Secondary | ICD-10-CM | POA: Insufficient documentation

## 2018-02-28 DIAGNOSIS — Z79899 Other long term (current) drug therapy: Secondary | ICD-10-CM | POA: Insufficient documentation

## 2018-02-28 DIAGNOSIS — R1031 Right lower quadrant pain: Secondary | ICD-10-CM | POA: Diagnosis not present

## 2018-02-28 DIAGNOSIS — Z885 Allergy status to narcotic agent status: Secondary | ICD-10-CM | POA: Diagnosis not present

## 2018-02-28 DIAGNOSIS — G8929 Other chronic pain: Secondary | ICD-10-CM

## 2018-02-28 DIAGNOSIS — G894 Chronic pain syndrome: Secondary | ICD-10-CM | POA: Insufficient documentation

## 2018-02-28 DIAGNOSIS — M5441 Lumbago with sciatica, right side: Secondary | ICD-10-CM | POA: Diagnosis not present

## 2018-02-28 MED ORDER — TRAMADOL HCL 50 MG PO TABS: ORAL_TABLET | ORAL | 0 refills | Status: DC

## 2018-02-28 MED FILL — traMADol HCL 50 MG TABS: 50 | 22 days supply | Qty: 45 | Fill #0
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
## 2018-03-19 ENCOUNTER — Telehealth: Payer: Self-pay | Admitting: Internal Medicine

## 2018-03-19 NOTE — Telephone Encounter (Signed)
Patient stopped by and requested for listed medications to be refilled, hardscript,   traMADol (ULTRAM) 50 MG tablet [[id number]] and a call once ready for pick up. Please fu at your earliest convenience.

## 2018-03-19 NOTE — Telephone Encounter (Signed)
Patient also requested for the full 60 quanity that he usually gets. Patient stated last time it was an error and received 15 then 45 at a later time.

## 2018-03-20 NOTE — Telephone Encounter (Signed)
I sent his prescriptions electronically to the pharmacy on file.  Last time he received a prescription I refilled it and it was not an error. He will need to establish care in the near future to discuss this further. Thanks.

## 2018-03-21 ENCOUNTER — Other Ambulatory Visit: Payer: Self-pay

## 2018-03-21 DIAGNOSIS — G8929 Other chronic pain: Secondary | ICD-10-CM

## 2018-03-21 DIAGNOSIS — R1031 Right lower quadrant pain: Principal | ICD-10-CM

## 2018-03-21 NOTE — Telephone Encounter (Signed)
Patient is requesting a refill on his Tramadol.

## 2018-03-21 NOTE — Telephone Encounter (Signed)
Patient was informed and verbalized understanding.

## 2018-03-25 ENCOUNTER — Encounter (HOSPITAL_COMMUNITY): Payer: Self-pay

## 2018-03-25 ENCOUNTER — Emergency Department (HOSPITAL_COMMUNITY)
Admission: EM | Admit: 2018-03-25 | Discharge: 2018-03-25 | Disposition: A | Discharge status: Home / Self Care | Payer: Medicare Other | Attending: Emergency Medicine | Admitting: Emergency Medicine

## 2018-03-25 DIAGNOSIS — Z5321 Procedure and treatment not carried out due to patient leaving prior to being seen by health care provider: Secondary | ICD-10-CM | POA: Insufficient documentation

## 2018-03-25 DIAGNOSIS — R1031 Right lower quadrant pain: Secondary | ICD-10-CM | POA: Insufficient documentation

## 2018-03-25 LAB — CBC
HCT: 38.5 % — ABNORMAL LOW (ref 39.0–52.0)
Hemoglobin: 13.8 g/dL (ref 13.0–17.0)
MCH: 32.8 pg (ref 26.0–34.0)
MCHC: 35.8 g/dL (ref 30.0–36.0)
MCV: 91.4 fL (ref 78.0–100.0)
Platelets: 214 10*3/uL (ref 150–400)
RBC: 4.21 MIL/uL — ABNORMAL LOW (ref 4.22–5.81)
RDW: 13.3 % (ref 11.5–15.5)
WBC: 7.4 10*3/uL (ref 4.0–10.5)

## 2018-03-25 LAB — URINALYSIS, ROUTINE W REFLEX MICROSCOPIC
Bacteria, UA: NONE SEEN
Glucose, UA: NEGATIVE mg/dL
Ketones, ur: 5 mg/dL — AB
Leukocytes, UA: NEGATIVE
Nitrite: NEGATIVE
Protein, ur: 30 mg/dL — AB
Specific Gravity, Urine: 1.035 — ABNORMAL HIGH (ref 1.005–1.030)
Squamous Epithelial / LPF: NONE SEEN
pH: 5 (ref 5.0–8.0)

## 2018-03-25 LAB — COMPREHENSIVE METABOLIC PANEL
ALT: 18 U/L (ref 17–63)
AST: 24 U/L (ref 15–41)
Albumin: 3.9 g/dL (ref 3.5–5.0)
Alkaline Phosphatase: 64 U/L (ref 38–126)
Anion gap: 9 (ref 5–15)
BUN: 5 mg/dL — ABNORMAL LOW (ref 6–20)
CO2: 20 mmol/L — ABNORMAL LOW (ref 22–32)
Calcium: 8.9 mg/dL (ref 8.9–10.3)
Chloride: 104 mmol/L (ref 101–111)
Creatinine, Ser: 0.86 mg/dL (ref 0.61–1.24)
GFR calc Af Amer: >60 mL/min (ref >60)
GFR calc non Af Amer: >60 mL/min (ref >60)
Glucose, Bld: 109 mg/dL — ABNORMAL HIGH (ref 65–99)
Potassium: 3.4 mmol/L — ABNORMAL LOW (ref 3.5–5.1)
Sodium: 133 mmol/L — ABNORMAL LOW (ref 135–145)
Total Bilirubin: 0.7 mg/dL (ref 0.3–1.2)
Total Protein: 6.8 g/dL (ref 6.5–8.1)

## 2018-03-25 LAB — LIPASE, BLOOD: Lipase: 28 U/L (ref 11–51)
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
## 2018-03-27 MED ORDER — TRAMADOL HCL 50 MG PO TABS: ORAL_TABLET | ORAL | 0 refills | Status: DC

## 2018-03-27 MED FILL — traMADol HCL 50 MG TABS: 50 | 30 days supply | Qty: 60 | Fill #0

## 2018-04-08 ENCOUNTER — Encounter: Payer: Self-pay | Admitting: Nurse Practitioner

## 2018-04-08 ENCOUNTER — Other Ambulatory Visit (HOSPITAL_COMMUNITY)
Admission: RE | Admit: 2018-04-08 | Discharge: 2018-04-08 | Disposition: A | Discharge status: Home / Self Care | Payer: Medicare Other | Source: Ambulatory Visit | Attending: Nurse Practitioner | Admitting: Nurse Practitioner

## 2018-04-08 ENCOUNTER — Ambulatory Visit: Payer: Medicare Other | Attending: Nurse Practitioner | Admitting: Nurse Practitioner

## 2018-04-08 VITALS — BP 119/83 | HR 87 | Temp 98.1°F | Ht 70.0 in | Wt 134.0 lb

## 2018-04-08 DIAGNOSIS — R1031 Right lower quadrant pain: Secondary | ICD-10-CM

## 2018-04-08 DIAGNOSIS — F1290 Cannabis use, unspecified, uncomplicated: Secondary | ICD-10-CM | POA: Diagnosis not present

## 2018-04-08 DIAGNOSIS — Z885 Allergy status to narcotic agent status: Secondary | ICD-10-CM | POA: Diagnosis not present

## 2018-04-08 DIAGNOSIS — K219 Gastro-esophageal reflux disease without esophagitis: Secondary | ICD-10-CM | POA: Diagnosis not present

## 2018-04-08 DIAGNOSIS — Z7251 High risk heterosexual behavior: Secondary | ICD-10-CM | POA: Insufficient documentation

## 2018-04-08 DIAGNOSIS — R109 Unspecified abdominal pain: Secondary | ICD-10-CM | POA: Diagnosis not present

## 2018-04-08 DIAGNOSIS — T300 Burn of unspecified body region, unspecified degree: Secondary | ICD-10-CM

## 2018-04-08 DIAGNOSIS — G8929 Other chronic pain: Secondary | ICD-10-CM

## 2018-04-08 DIAGNOSIS — K2100 Gastro-esophageal reflux disease with esophagitis, without bleeding: Secondary | ICD-10-CM

## 2018-04-08 DIAGNOSIS — Z79899 Other long term (current) drug therapy: Secondary | ICD-10-CM | POA: Insufficient documentation

## 2018-04-08 DIAGNOSIS — Z800 Family history of malignant neoplasm of digestive organs: Secondary | ICD-10-CM | POA: Insufficient documentation

## 2018-04-08 DIAGNOSIS — Z760 Encounter for issue of repeat prescription: Secondary | ICD-10-CM | POA: Insufficient documentation

## 2018-04-08 DIAGNOSIS — Z202 Contact with and (suspected) exposure to infections with a predominantly sexual mode of transmission: Secondary | ICD-10-CM | POA: Diagnosis not present

## 2018-04-08 DIAGNOSIS — M62838 Other muscle spasm: Secondary | ICD-10-CM

## 2018-04-08 DIAGNOSIS — Z8249 Family history of ischemic heart disease and other diseases of the circulatory system: Secondary | ICD-10-CM | POA: Insufficient documentation

## 2018-04-08 MED ORDER — SILVER SULFADIAZINE 1 % EX CREA: 1.0000 "application " | TOPICAL_CREAM | Freq: Every day | CUTANEOUS | 1 refills | Status: DC

## 2018-04-08 MED ORDER — CYCLOBENZAPRINE HCL 10 MG PO TABS: 10.0000 mg | ORAL_TABLET | Freq: Three times a day (TID) | ORAL | 3 refills | Status: DC | PRN

## 2018-04-08 MED ORDER — DICYCLOMINE HCL 20 MG PO TABS: 20.0000 mg | ORAL_TABLET | Freq: Two times a day (BID) | ORAL | 3 refills | Status: DC

## 2018-04-08 MED ORDER — OMEPRAZOLE 20 MG PO CPDR
20.0000 mg | DELAYED_RELEASE_CAPSULE | Freq: Every day | ORAL | 0 refills | Status: DC
Start: 2018-04-08 — End: 2018-06-17

## 2018-04-08 NOTE — Patient Instructions (Addendum)
Chronic Pain, Adult Chronic pain is a type of pain that lasts or keeps coming back (recurs) for at least six months. You may have chronic headaches, abdominal pain, or body pain. Chronic pain may be related to an illness, such as fibromyalgia or complex regional pain syndrome. Sometimes the cause of chronic pain is not known. Chronic pain can make it hard for you to do daily activities. If not treated, chronic pain can lead to other health problems, including anxiety and depression. Treatment depends on the cause and severity of your pain. You may need to work with a pain specialist to come up with a treatment plan. The plan may include medicine, counseling, and physical therapy. Many people benefit from a combination of two or more types of treatment to control their pain. Follow these instructions at home: Lifestyle  Consider keeping a pain diary to share with your health care providers.  Consider talking with a mental health care provider (psychologist) about how to cope with chronic pain.  Consider joining a chronic pain support group.  Try to control or lower your stress levels. Talk to your health care provider about strategies to do this. General instructions   Take over-the-counter and prescription medicines only as told by your health care provider.  Follow your treatment plan as told by your health care provider. This may include: ? Gentle, regular exercise. ? Eating a healthy diet that includes foods such as vegetables, fruits, fish, and lean meats. ? Cognitive or behavioral therapy. ? Working with a Community education officer. ? Meditation or yoga. ? Acupuncture or massage therapy. ? Aroma, color, light, or sound therapy. ? Local electrical stimulation. ? Shots (injections) of numbing or pain-relieving medicines into the spine or the area of pain.  Check your pain level as told by your health care provider. Ask your health care provider if you should use a pain scale.  Learn as  much as you can about how to manage your chronic pain. Ask your health care provider if an intensive pain rehabilitation program or a chronic pain specialist would be helpful.  Keep all follow-up visits as told by your health care provider. This is important. Contact a health care provider if:  Your pain gets worse.  You have new pain.  You have trouble sleeping.  You have trouble doing your normal activities.  Your pain is not controlled with treatment.  Your have side effects from pain medicine.  You feel weak. Get help right away if:  You lose feeling or have numbness in your body.  You lose control of bowel or bladder function.  Your pain suddenly gets much worse.  You develop shaking or chills.  You develop confusion.  You develop chest pain.  You have trouble breathing or shortness of breath.  You pass out.  You have thoughts about hurting yourself or others. This information is not intended to replace advice given to you by your health care provider. Make sure you discuss any questions you have with your health care provider. Document Released: 08/12/2002 Document Revised: 07/20/2016 Document Reviewed: 05/09/2016 Elsevier Interactive Patient Education  2018 [employer]. Sexually Transmitted Disease A sexually transmitted disease (STD) is a disease or infection that may be passed (transmitted) from person to person, usually during sexual activity. This may happen by way of saliva, semen, blood, vaginal mucus, or urine. Common STDs include:  Gonorrhea.  Chlamydia.  Syphilis.  HIV and AIDS.  Genital herpes.  Hepatitis B and C.  Trichomonas.  Human papillomavirus (  HPV).  Pubic lice.  Scabies.  Mites.  Bacterial vaginosis.  What are the causes? An STD may be caused by bacteria, a virus, or parasites. STDs are often transmitted during sexual activity if one person is infected. However, they may also be transmitted through nonsexual means. STDs  may be transmitted after:  Sexual intercourse with an infected person.  Sharing sex toys with an infected person.  Sharing needles with an infected person or using unclean piercing or tattoo needles.  Having intimate contact with the genitals, mouth, or rectal areas of an infected person.  Exposure to infected fluids during birth.  What are the signs or symptoms? Different STDs have different symptoms. Some people may not have any symptoms. If symptoms are present, they may include:  Painful or bloody urination.  Pain in the pelvis, abdomen, vagina, anus, throat, or eyes.  A skin rash, itching, or irritation.  Growths, ulcerations, blisters, or sores in the genital and anal areas.  Abnormal vaginal discharge with or without bad odor.  Penile discharge in men.  Fever.  Pain or bleeding during sexual intercourse.  Swollen glands in the groin area.  Yellow skin and eyes (jaundice). This is seen with hepatitis.  Swollen testicles.  Infertility.  Sores and blisters in the mouth.  How is this diagnosed? To make a diagnosis, your health care provider may:  Take a medical history.  Perform a physical exam.  Take a sample of any discharge to examine.  Swab the throat, cervix, opening to the penis, rectum, or vagina for testing.  Test a sample of your first morning urine.  Perform blood tests.  Perform a Pap test, if this applies.  Perform a colposcopy.  Perform a laparoscopy.  How is this treated? Treatment depends on the STD. Some STDs may be treated but not cured.  Chlamydia, gonorrhea, trichomonas, and syphilis can be cured with antibiotic medicine.  Genital herpes, hepatitis, and HIV can be treated, but not cured, with prescribed medicines. The medicines lessen symptoms.  Genital warts from HPV can be treated with medicine or by freezing, burning (electrocautery), or surgery. Warts may come back.  HPV cannot be cured with medicine or surgery. However,  abnormal areas may be removed from the cervix, vagina, or vulva.  If your diagnosis is confirmed, your recent sexual partners need treatment. This is true even if they are symptom-free or have a negative culture or evaluation. They should not have sex until their health care providers say it is okay.  Your health care provider may test you for infection again 3 months after treatment.  How is this prevented? Take these steps to reduce your risk of getting an STD:  Use latex condoms, dental dams, and water-soluble lubricants during sexual activity. Do not use petroleum jelly or oils.  Avoid having multiple sex partners.  Do not have sex with someone who has other sex partners.  Do not have sex with anyone you do not know or who is at high risk for an STD.  Avoid risky sex practices that can break your skin.  Do not have sex if you have open sores on your mouth or skin.  Avoid drinking too much alcohol or taking illegal drugs. Alcohol and drugs can affect your judgment and put you in a vulnerable position.  Avoid engaging in oral and anal sex acts.  Get vaccinated for HPV and hepatitis. If you have not received these vaccines in the past, talk to your health care provider about whether one  or both might be right for you.  If you are at risk of being infected with HIV, it is recommended that you take a prescription medicine daily to prevent HIV infection. This is called pre-exposure prophylaxis (PrEP). You are considered at risk if: ? You are a man who has sex with other men (MSM). ? You are a heterosexual man or woman and are sexually active with more than one partner. ? You take drugs by injection. ? You are sexually active with a partner who has HIV.  Talk with your health care provider about whether you are at high risk of being infected with HIV. If you choose to begin PrEP, you should first be tested for HIV. You should then be tested every 3 months for as long as you are taking  PrEP.  Contact a health care provider if:  See your health care provider.  Tell your sexual partner(s). They should be tested and treated for any STDs.  Do not have sex until your health care provider says it is okay. Get help right away if: Contact your health care provider right away if:  You have severe abdominal pain.  You are a man and notice swelling or pain in your testicles.  You are a woman and notice swelling or pain in your vagina.  This information is not intended to replace advice given to you by your health care provider. Make sure you discuss any questions you have with your health care provider. Document Released: 02/10/2003 Document Revised: 06/09/2016 Document Reviewed: 06/10/2013 Elsevier Interactive Patient Education  2018 [employer].
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
## 2018-04-09 LAB — URINE CYTOLOGY ANCILLARY ONLY
Chlamydia: NEGATIVE
Neisseria Gonorrhea: NEGATIVE
Trichomonas: NEGATIVE

## 2018-04-10 LAB — HSV TYPE I/II IGG, IGMW/ REFLEX
HSV 1 Glycoprotein G Ab, IgG: <0.91 index (ref 0.00–0.90)
HSV 1 IgM: <1:10 {titer}
HSV 2 IgG, Type Spec: 3.21 index — ABNORMAL HIGH (ref 0.00–0.90)
HSV 2 IgM: <1:10 {titer}

## 2018-04-10 LAB — CMP14+EGFR
ALT: 21 IU/L (ref 0–44)
AST: 20 IU/L (ref 0–40)
Albumin/Globulin Ratio: 1.5 (ref 1.2–2.2)
Albumin: 4.4 g/dL (ref 3.5–5.5)
Alkaline Phosphatase: 81 IU/L (ref 39–117)
BUN/Creatinine Ratio: 8 — ABNORMAL LOW (ref 9–20)
BUN: 8 mg/dL (ref 6–24)
Bilirubin Total: 0.2 mg/dL (ref 0.0–1.2)
CO2: 23 mmol/L (ref 20–29)
Calcium: 9.7 mg/dL (ref 8.7–10.2)
Chloride: 102 mmol/L (ref 96–106)
Creatinine, Ser: 1.04 mg/dL (ref 0.76–1.27)
GFR calc Af Amer: 94 mL/min/{1.73_m2} (ref >59)
GFR calc non Af Amer: 81 mL/min/{1.73_m2} (ref >59)
Globulin, Total: 2.9 g/dL (ref 1.5–4.5)
Glucose: 81 mg/dL (ref 65–99)
Potassium: 4.3 mmol/L (ref 3.5–5.2)
Sodium: 139 mmol/L (ref 134–144)
Total Protein: 7.3 g/dL (ref 6.0–8.5)

## 2018-04-10 LAB — HIV ANTIBODY (ROUTINE TESTING W REFLEX): HIV Screen 4th Generation wRfx: NONREACTIVE

## 2018-04-10 LAB — RPR: RPR Ser Ql: NONREACTIVE

## 2018-04-10 LAB — HEPATITIS C ANTIBODY: Hep C Virus Ab: <0.1 s/co ratio (ref 0.0–0.9)

## 2018-04-10 LAB — HSV-2 IGG SUPPLEMENTAL TEST: HSV-2 IgG Supplemental Test: POSITIVE — AB

## 2018-04-11 LAB — URINE CYTOLOGY ANCILLARY ONLY: Bacterial vaginitis: POSITIVE — AB

## 2018-04-12 ENCOUNTER — Telehealth: Payer: Self-pay

## 2018-04-12 NOTE — Telephone Encounter (Signed)
CMA spoke to patient to inform on lab results.  Patient verified DOB. Patient understood.  

## 2018-04-12 NOTE — Telephone Encounter (Signed)
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 This virus is normally found on the genitals. If you have any outbreaks or lesions that occur on your genitals we can treat you for that in the future however there is no cure for the Herpes virus. Please make sure you wear a condom during sexual activity.

## 2018-04-15 ENCOUNTER — Other Ambulatory Visit: Payer: Self-pay | Admitting: Nurse Practitioner

## 2018-04-15 MED ORDER — METRONIDAZOLE 500 MG PO TABS: 500.0000 mg | ORAL_TABLET | Freq: Two times a day (BID) | ORAL | 0 refills | Status: AC

## 2018-04-16 ENCOUNTER — Telehealth: Payer: Self-pay

## 2018-04-16 ENCOUNTER — Telehealth: Payer: Self-pay | Admitting: Internal Medicine

## 2018-04-16 MED FILL — DICYCLOMINE HCL 20 MG TABS: 20 | 30 days supply | Qty: 60 | Fill #0

## 2018-04-16 MED FILL — metroNIDAZOLE 500 MG TABS: 500 | 7 days supply | Qty: 14 | Fill #0

## 2018-04-16 MED FILL — SSD 1% CREAM: 1 | 20 days supply | Qty: 50 | Fill #0

## 2018-04-16 MED FILL — OMEPRAZOLE DR 20 MG CAPSULE: 20 | 30 days supply | Qty: 30 | Fill #0

## 2018-04-16 NOTE — Telephone Encounter (Signed)
CMA called patient to inform on lab results and Rx.  Patient understood.

## 2018-04-16 NOTE — Telephone Encounter (Signed)
Patient was informal and verbalized understanding.

## 2018-04-16 NOTE — Telephone Encounter (Signed)
Patient came in wanting medication refill for traMADol (ULTRAM) 50 MG tablet.  Please follow up.

## 2018-04-16 NOTE — Telephone Encounter (Signed)
Denied. Please refer to my last office note. In addition his last refill as on 03-27-2018

## 2018-04-16 NOTE — Telephone Encounter (Signed)
-----   Message from Gildardo Pounds, NP sent at 04/15/2018  7:28 PM EDT ----- Urine was positive for bacterial infection. Will send prescription to pharmacy
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
## 2018-05-20 ENCOUNTER — Ambulatory Visit: Payer: Medicare Other | Admitting: Family Medicine

## 2018-05-24 ENCOUNTER — Encounter: Payer: Self-pay | Admitting: Family Medicine

## 2018-05-24 ENCOUNTER — Ambulatory Visit (INDEPENDENT_AMBULATORY_CARE_PROVIDER_SITE_OTHER): Payer: Medicare Other | Admitting: Family Medicine

## 2018-05-24 VITALS — BP 116/74 | HR 70 | Temp 98.4°F | Resp 16 | Ht 70.0 in | Wt 133.0 lb

## 2018-05-24 DIAGNOSIS — Z720 Tobacco use: Secondary | ICD-10-CM

## 2018-05-24 DIAGNOSIS — M62838 Other muscle spasm: Secondary | ICD-10-CM

## 2018-05-24 NOTE — Progress Notes (Signed)
[name], a 52 year old male that presented to establish care. Patient was belligerent and appeared to be agitated during initial conversation. He decided that he did not want me to be his provider and did not want to establish at center with another provider.    Donia Pounds  MSN, FNP-C [facility] [address] [place], [place] [address] [phone]
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
## 2018-06-14 ENCOUNTER — Other Ambulatory Visit: Payer: Self-pay

## 2018-06-14 ENCOUNTER — Encounter (INDEPENDENT_AMBULATORY_CARE_PROVIDER_SITE_OTHER): Payer: Self-pay | Admitting: Physician Assistant

## 2018-06-14 ENCOUNTER — Ambulatory Visit (INDEPENDENT_AMBULATORY_CARE_PROVIDER_SITE_OTHER): Payer: Medicare Other | Admitting: Physician Assistant

## 2018-06-14 VITALS — BP 118/77 | HR 69 | Temp 98.2°F | Ht 70.0 in | Wt 135.2 lb

## 2018-06-14 DIAGNOSIS — R112 Nausea with vomiting, unspecified: Secondary | ICD-10-CM | POA: Diagnosis not present

## 2018-06-14 DIAGNOSIS — Z79899 Other long term (current) drug therapy: Secondary | ICD-10-CM | POA: Diagnosis not present

## 2018-06-14 DIAGNOSIS — R1084 Generalized abdominal pain: Secondary | ICD-10-CM | POA: Diagnosis not present

## 2018-06-14 DIAGNOSIS — Z131 Encounter for screening for diabetes mellitus: Secondary | ICD-10-CM

## 2018-06-14 LAB — POCT GLYCOSYLATED HEMOGLOBIN (HGB A1C): Hemoglobin A1C: 5.4 % (ref 4.0–5.6)
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
## 2018-06-14 NOTE — Progress Notes (Signed)
Pt openly admits to smoking marijuana and is not going to stop Pt is not taking any medication since he can not have his pain medication

## 2018-06-14 NOTE — Patient Instructions (Signed)

## 2018-06-15 LAB — DRUG SCREEN, URINE
Amphetamines, Urine: NEGATIVE ng/mL
Barbiturate screen, urine: NEGATIVE ng/mL
Benzodiazepine Quant, Ur: NEGATIVE ng/mL
Cannabinoid Quant, Ur: POSITIVE ng/mL — AB
Cocaine (Metab.): NEGATIVE ng/mL
Opiate Quant, Ur: NEGATIVE ng/mL
PCP Quant, Ur: NEGATIVE ng/mL

## 2018-06-18 LAB — BASIC METABOLIC PANEL
BUN/Creatinine Ratio: 10 (ref 9–20)
BUN: 11 mg/dL (ref 6–24)
CO2: 27 mmol/L (ref 20–29)
Calcium: 9.6 mg/dL (ref 8.7–10.2)
Chloride: 101 mmol/L (ref 96–106)
Creatinine, Ser: 1.14 mg/dL (ref 0.76–1.27)
GFR calc Af Amer: 84 mL/min/{1.73_m2} (ref >59)
GFR calc non Af Amer: 73 mL/min/{1.73_m2} (ref >59)
Glucose: 93 mg/dL (ref 65–99)
Potassium: 4.1 mmol/L (ref 3.5–5.2)
Sodium: 140 mmol/L (ref 134–144)

## 2018-06-18 LAB — CBC WITH DIFFERENTIAL/PLATELET
Basophils Absolute: 0 10*3/uL (ref 0.0–0.2)
Basos: 1 %
EOS (ABSOLUTE): 0.1 10*3/uL (ref 0.0–0.4)
Eos: 1 %
Hematocrit: 41.2 % (ref 37.5–51.0)
Hemoglobin: 14 g/dL (ref 13.0–17.7)
Immature Grans (Abs): 0 10*3/uL (ref 0.0–0.1)
Immature Granulocytes: 0 %
Lymphocytes Absolute: 3 10*3/uL (ref 0.7–3.1)
Lymphs: 38 %
MCH: 32.1 pg (ref 26.6–33.0)
MCHC: 34 g/dL (ref 31.5–35.7)
MCV: 95 fL (ref 79–97)
Monocytes Absolute: 0.6 10*3/uL (ref 0.1–0.9)
Monocytes: 7 %
Neutrophils Absolute: 4.3 10*3/uL (ref 1.4–7.0)
Neutrophils: 53 %
Platelets: 249 10*3/uL (ref 150–450)
RBC: 4.36 x10E6/uL (ref 4.14–5.80)
RDW: 14.5 % (ref 12.3–15.4)
WBC: 8.1 10*3/uL (ref 3.4–10.8)

## 2018-06-18 LAB — ANA W/REFLEX: Anti Nuclear Antibody(ANA): NEGATIVE

## 2018-06-18 LAB — C-REACTIVE PROTEIN: CRP: <1 mg/L (ref 0–10)

## 2018-06-18 LAB — SEDIMENTATION RATE: Sed Rate: 5 mm/hr (ref 0–30)

## 2018-06-18 LAB — CELIAC DISEASE PANEL
Endomysial IgA: NEGATIVE
IgA/Immunoglobulin A, Serum: 388 mg/dL — ABNORMAL HIGH (ref 90–386)
Transglutaminase IgA: <2 U/mL (ref 0–3)

## 2018-06-20 ENCOUNTER — Telehealth (INDEPENDENT_AMBULATORY_CARE_PROVIDER_SITE_OTHER): Payer: Self-pay

## 2018-06-20 NOTE — Telephone Encounter (Signed)
-----   Message from Clent Demark, PA-C sent at 06/19/2018  9:45 AM EDT ----- Overall normal labs except for slight increase in IgA which may indicate other diseases or an infection. I can work up further for sarcoidosis and infection. He may also need to see GI which may order further testing to include CT abdomen with contrast (non-contrast performed before).

## 2018-06-20 NOTE — Telephone Encounter (Signed)
Left patient a voicemail informing that overall the labs are normal except for slight increase in IgA which may indicate other diseases or an infection. Can be worked up further for sarcoidosis and infection. May also need to see GI which may order further testing to include CT abdomen with contrast since CT non contrast has been performed before. Call [facility] with any questions. Nat Christen, CMA

## 2018-06-24 ENCOUNTER — Telehealth (INDEPENDENT_AMBULATORY_CARE_PROVIDER_SITE_OTHER): Payer: Self-pay | Admitting: Physician Assistant

## 2018-06-24 NOTE — Telephone Encounter (Signed)
Unfortunately, tramadol can not be prescribed when he is concurrently using marijuana. He will need to stop smoking weed and pass his drug tests in order for there to be a chance to use Tramadol.

## 2018-06-24 NOTE — Telephone Encounter (Signed)
Patient called requesting a medication refill for tramadol, Zofran patient could not remember the names for the other medications but states its like 7 other medications. Patient states that PCP was supposed to give him a call last Monday 06-17-18 with an update.  Please Advice [phone]  Thank Forsan

## 2018-06-25 NOTE — Telephone Encounter (Signed)
Patient states inform PCP " that he will be seeing him"

## 2018-06-25 NOTE — Telephone Encounter (Signed)
Patients mother said she will give him the message to call our office. Also left voicemail on patients cell number asking him to call our office. Nat Christen, CMA

## 2018-06-25 NOTE — Telephone Encounter (Signed)
Ok,noted

## 2018-06-28 ENCOUNTER — Ambulatory Visit: Payer: Medicare Other | Admitting: Family Medicine

## 2018-07-18 ENCOUNTER — Emergency Department (HOSPITAL_COMMUNITY)
Admission: EM | Admit: 2018-07-18 | Discharge: 2018-07-18 | Disposition: A | Discharge status: Home / Self Care | Payer: Medicare Other | Attending: Emergency Medicine | Admitting: Emergency Medicine

## 2018-07-18 ENCOUNTER — Encounter (HOSPITAL_COMMUNITY): Payer: Self-pay | Admitting: Emergency Medicine

## 2018-07-18 DIAGNOSIS — Z5321 Procedure and treatment not carried out due to patient leaving prior to being seen by health care provider: Secondary | ICD-10-CM | POA: Diagnosis not present

## 2018-07-18 DIAGNOSIS — R109 Unspecified abdominal pain: Secondary | ICD-10-CM | POA: Insufficient documentation

## 2018-07-18 LAB — CBC
HCT: 40.5 % (ref 39.0–52.0)
Hemoglobin: 13.2 g/dL (ref 13.0–17.0)
MCH: 31.5 pg (ref 26.0–34.0)
MCHC: 32.6 g/dL (ref 30.0–36.0)
MCV: 96.7 fL (ref 78.0–100.0)
Platelets: 207 10*3/uL (ref 150–400)
RBC: 4.19 MIL/uL — ABNORMAL LOW (ref 4.22–5.81)
RDW: 14 % (ref 11.5–15.5)
WBC: 8.6 10*3/uL (ref 4.0–10.5)

## 2018-07-18 LAB — TYPE AND SCREEN
ABO/RH(D): A POS
Antibody Screen: NEGATIVE

## 2018-07-18 LAB — COMPREHENSIVE METABOLIC PANEL
ALT: 42 U/L (ref 0–44)
AST: 34 U/L (ref 15–41)
Albumin: 3.7 g/dL (ref 3.5–5.0)
Alkaline Phosphatase: 60 U/L (ref 38–126)
Anion gap: 7 (ref 5–15)
BUN: 5 mg/dL — ABNORMAL LOW (ref 6–20)
CO2: 25 mmol/L (ref 22–32)
Calcium: 9.5 mg/dL (ref 8.9–10.3)
Chloride: 106 mmol/L (ref 98–111)
Creatinine, Ser: 1.03 mg/dL (ref 0.61–1.24)
GFR calc Af Amer: >60 mL/min (ref >60)
GFR calc non Af Amer: >60 mL/min (ref >60)
Glucose, Bld: 94 mg/dL (ref 70–99)
Potassium: 4.1 mmol/L (ref 3.5–5.1)
Sodium: 138 mmol/L (ref 135–145)
Total Bilirubin: 0.6 mg/dL (ref 0.3–1.2)
Total Protein: 6.7 g/dL (ref 6.5–8.1)

## 2018-07-18 LAB — ABO/RH: ABO/RH(D): A POS

## 2018-07-18 NOTE — ED Notes (Signed)
Called patient for recheck vitals patient did not answer
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
## 2018-07-18 NOTE — ED Notes (Signed)
Pt was called 3 times and no response.

## 2018-07-26 ENCOUNTER — Inpatient Hospital Stay: Payer: Medicare Other | Admitting: Family Medicine

## 2018-09-17 ENCOUNTER — Ambulatory Visit (INDEPENDENT_AMBULATORY_CARE_PROVIDER_SITE_OTHER): Payer: Medicare Other | Admitting: Physician Assistant

## 2018-10-14 ENCOUNTER — Ambulatory Visit (INDEPENDENT_AMBULATORY_CARE_PROVIDER_SITE_OTHER): Payer: Medicare Other | Admitting: Physician Assistant

## 2018-12-19 IMAGING — CR DG ABDOMEN ACUTE W/ 1V CHEST
3 series · 3 of 3 positions shown · non-contrast
Comparison: CT abdomen and pelvis 04/29/2017, abdomen radiographs
from 06/05/2017

CLINICAL DATA: Abdominal pain

EXAM:
DG ABDOMEN ACUTE W/ 1V CHEST

[chest pa]
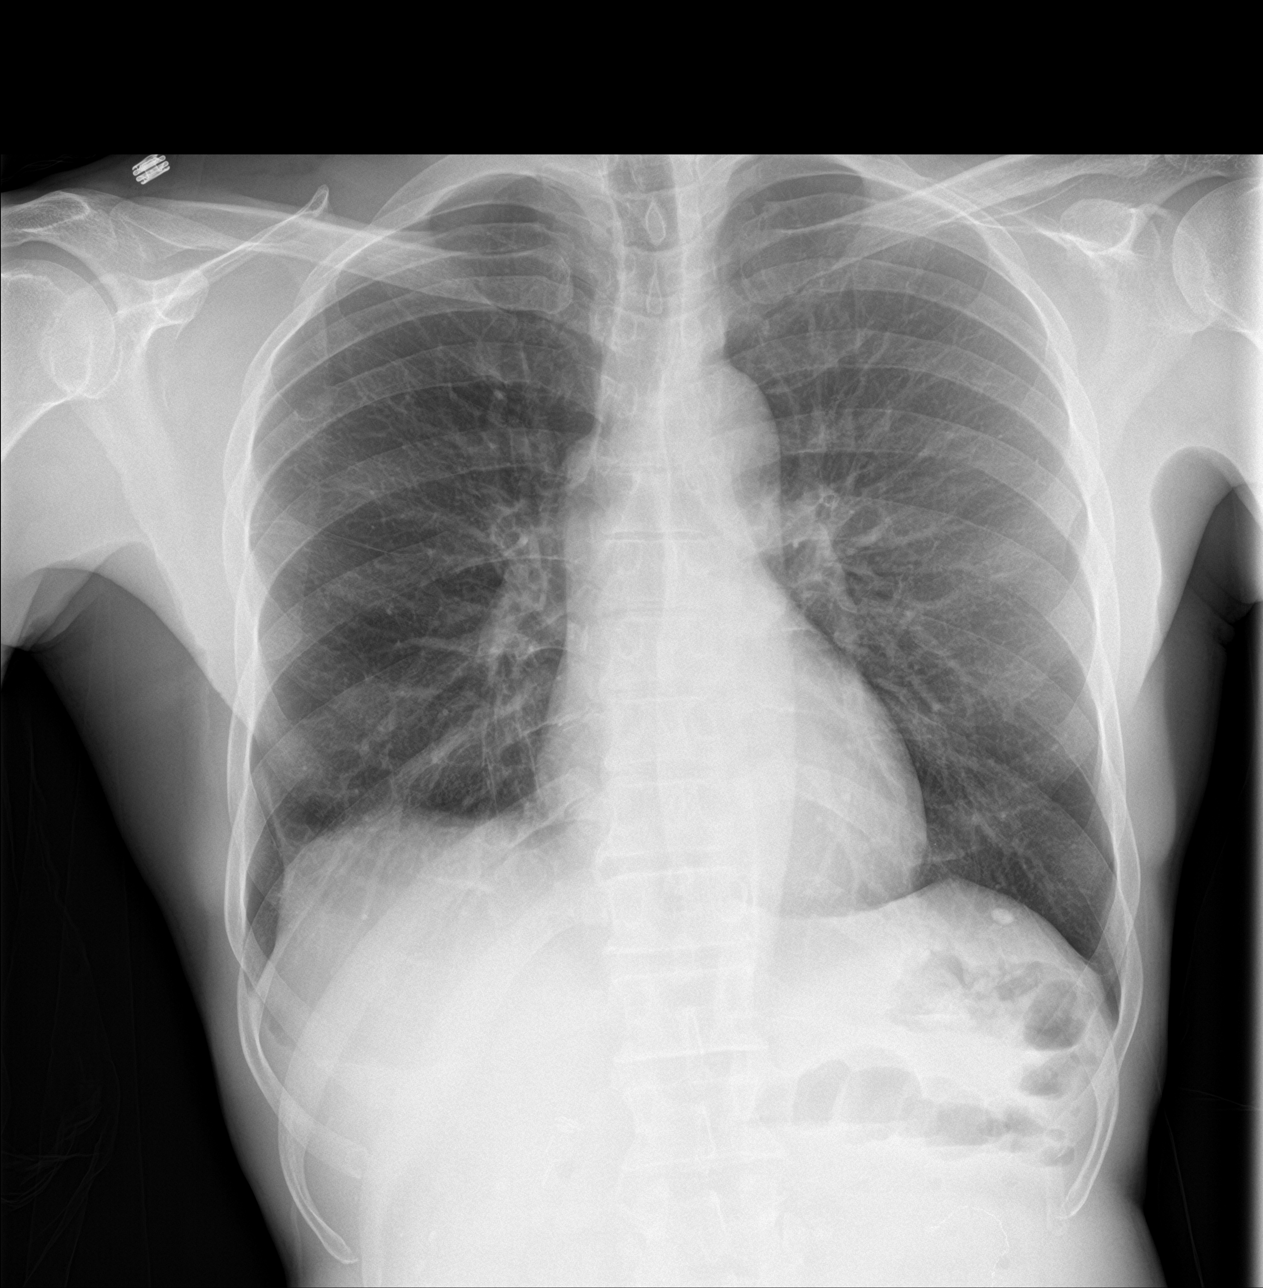

[abdomen erect]
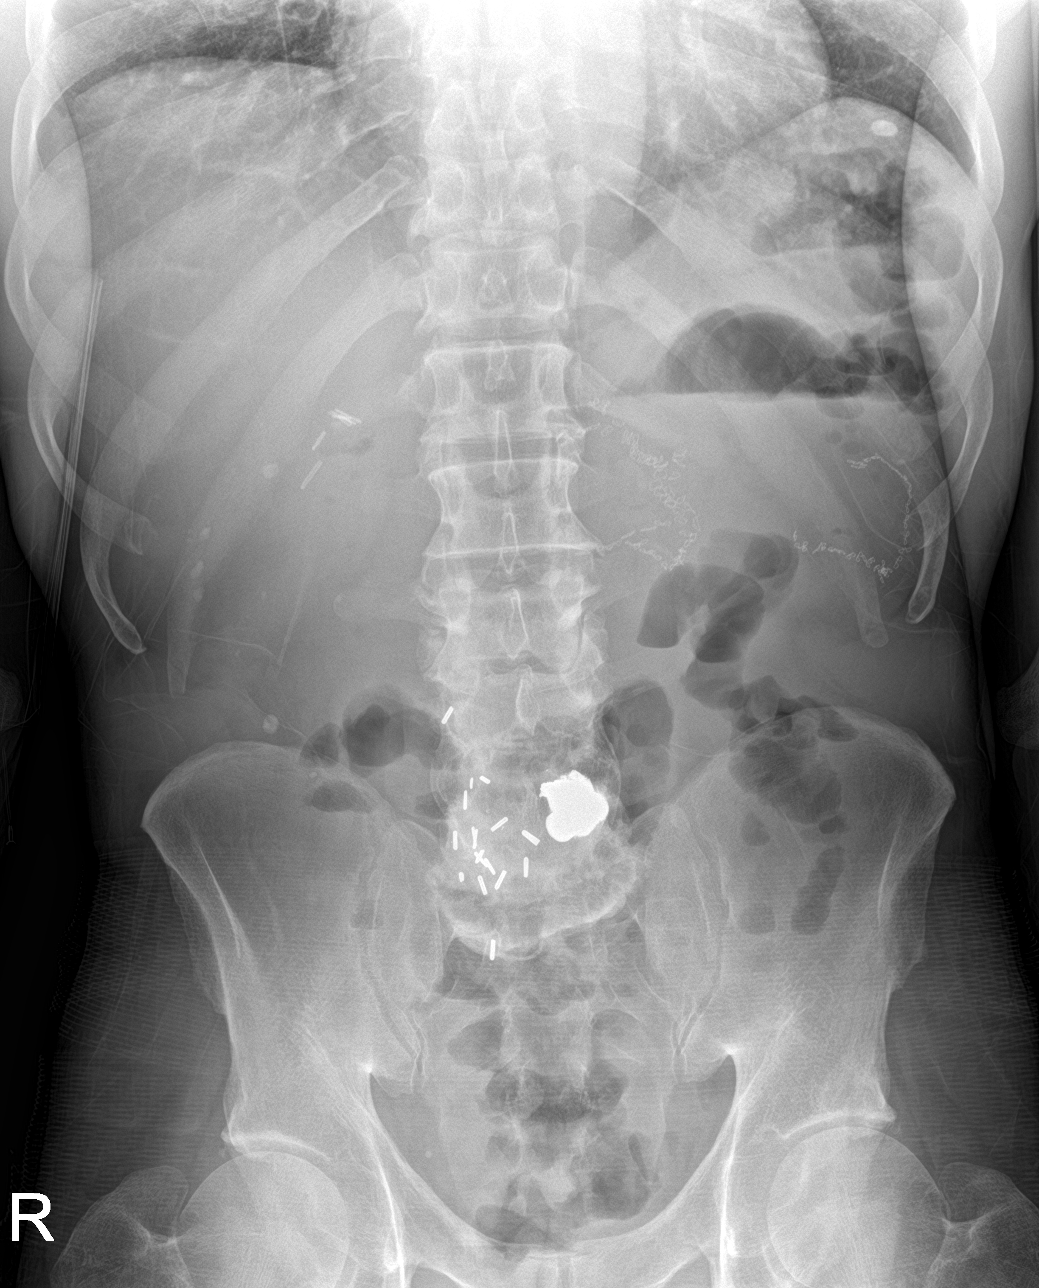

[abdomen supine]
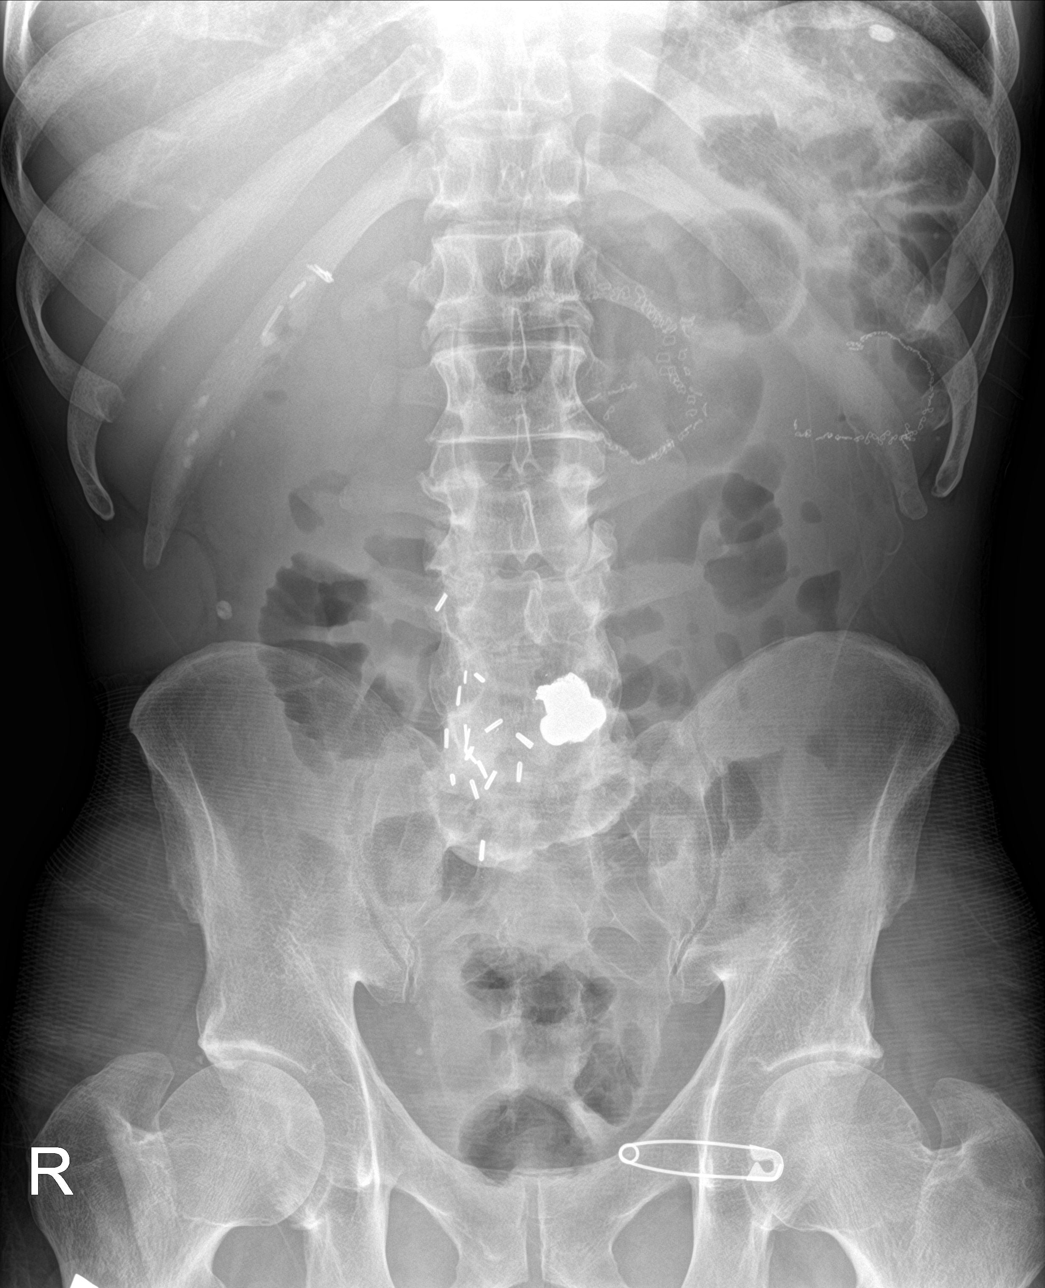

[3 of 3 positions shown; findings below may reference images not displayed]

FINDINGS: Heart and mediastinal contours are within normal limits. Lungs are
clear without pneumonic consolidation, effusion or pneumothorax.
Stable ovoid calcified densities seen beneath the left hemidiaphragm
possibly a small phlebolith or granuloma. Cholecystectomy clips are
seen in the right upper quadrant. Numerous chain sutures are seen in
the left hemiabdomen. Metallic density projects over the lower
lumbar spine with numerous adjacent surgical clips in the right
lower quadrant. A few scattered mildly distended gas containing
small bowel loops are seen within the abdomen possibly representing
an enteritis given slight air-fluid levels on the upright projection
noted. Gas is noted within the rectosigmoid. No large bowel
dilatation is seen.
IMPRESSION: Clear lungs.

Nonspecific bowel gas pattern. A few mildly distended small bowel
loops with scattered air-fluid levels are noted possibly
representing an enteritis. No definite bowel obstruction.

## 2019-02-04 DIAGNOSIS — T402X5A Adverse effect of other opioids, initial encounter: Secondary | ICD-10-CM | POA: Insufficient documentation

## 2019-02-04 DIAGNOSIS — R109 Unspecified abdominal pain: Secondary | ICD-10-CM | POA: Insufficient documentation

## 2019-02-04 DIAGNOSIS — F17210 Nicotine dependence, cigarettes, uncomplicated: Secondary | ICD-10-CM | POA: Insufficient documentation

## 2019-02-04 DIAGNOSIS — Z79899 Other long term (current) drug therapy: Secondary | ICD-10-CM | POA: Insufficient documentation

## 2019-02-04 DIAGNOSIS — N289 Disorder of kidney and ureter, unspecified: Secondary | ICD-10-CM | POA: Insufficient documentation

## 2019-02-04 DIAGNOSIS — G8929 Other chronic pain: Secondary | ICD-10-CM | POA: Insufficient documentation

## 2019-02-04 DIAGNOSIS — R112 Nausea with vomiting, unspecified: Secondary | ICD-10-CM | POA: Insufficient documentation

## 2019-02-04 DIAGNOSIS — R030 Elevated blood-pressure reading, without diagnosis of hypertension: Secondary | ICD-10-CM | POA: Insufficient documentation

## 2019-02-04 DIAGNOSIS — K5903 Drug induced constipation: Secondary | ICD-10-CM | POA: Insufficient documentation

## 2019-02-15 ENCOUNTER — Other Ambulatory Visit: Payer: Self-pay

## 2019-02-15 ENCOUNTER — Emergency Department (HOSPITAL_COMMUNITY): Payer: Medicare Other

## 2019-02-15 ENCOUNTER — Encounter (HOSPITAL_COMMUNITY): Payer: Self-pay | Admitting: Emergency Medicine

## 2019-02-15 ENCOUNTER — Emergency Department (HOSPITAL_COMMUNITY)
Admission: EM | Admit: 2019-02-15 | Discharge: 2019-02-15 | Disposition: A | Discharge status: Home / Self Care | Payer: Medicare Other | Attending: Emergency Medicine | Admitting: Emergency Medicine

## 2019-02-15 DIAGNOSIS — R110 Nausea: Secondary | ICD-10-CM | POA: Diagnosis not present

## 2019-02-15 DIAGNOSIS — R1084 Generalized abdominal pain: Secondary | ICD-10-CM | POA: Diagnosis not present

## 2019-02-15 DIAGNOSIS — F1210 Cannabis abuse, uncomplicated: Secondary | ICD-10-CM | POA: Insufficient documentation

## 2019-02-15 DIAGNOSIS — R109 Unspecified abdominal pain: Secondary | ICD-10-CM | POA: Diagnosis present

## 2019-02-15 DIAGNOSIS — F17210 Nicotine dependence, cigarettes, uncomplicated: Secondary | ICD-10-CM | POA: Insufficient documentation

## 2019-02-15 LAB — CBC
HCT: 46.5 % (ref 39.0–52.0)
Hemoglobin: 15.5 g/dL (ref 13.0–17.0)
MCH: 31.6 pg (ref 26.0–34.0)
MCHC: 33.3 g/dL (ref 30.0–36.0)
MCV: 94.9 fL (ref 80.0–100.0)
Platelets: 212 10*3/uL (ref 150–400)
RBC: 4.9 MIL/uL (ref 4.22–5.81)
RDW: 13.1 % (ref 11.5–15.5)
WBC: 7 10*3/uL (ref 4.0–10.5)
nRBC: 0 % (ref 0.0–0.2)

## 2019-02-15 LAB — LIPASE, BLOOD: Lipase: 64 U/L — ABNORMAL HIGH (ref 11–51)

## 2019-02-15 LAB — COMPREHENSIVE METABOLIC PANEL
ALT: 23 U/L (ref 0–44)
AST: 28 U/L (ref 15–41)
Albumin: 4.2 g/dL (ref 3.5–5.0)
Alkaline Phosphatase: 68 U/L (ref 38–126)
Anion gap: 10 (ref 5–15)
BUN: 9 mg/dL (ref 6–20)
CO2: 19 mmol/L — ABNORMAL LOW (ref 22–32)
Calcium: 9.3 mg/dL (ref 8.9–10.3)
Chloride: 110 mmol/L (ref 98–111)
Creatinine, Ser: 1.16 mg/dL (ref 0.61–1.24)
GFR calc Af Amer: >60 mL/min (ref >60)
GFR calc non Af Amer: >60 mL/min (ref >60)
Glucose, Bld: 106 mg/dL — ABNORMAL HIGH (ref 70–99)
Potassium: 4.6 mmol/L (ref 3.5–5.1)
Sodium: 139 mmol/L (ref 135–145)
Total Bilirubin: 1.1 mg/dL (ref 0.3–1.2)
Total Protein: 7.5 g/dL (ref 6.5–8.1)

## 2019-02-15 MED ORDER — ONDANSETRON HCL 4 MG/2ML IJ SOLN
4.0000 mg | Freq: Once | INTRAMUSCULAR | Status: AC
  Administered 2019-02-15: 4 mg via INTRAVENOUS
  Filled 2019-02-15: qty 2

## 2019-02-15 MED ORDER — HYDROMORPHONE HCL 1 MG/ML IJ SOLN
1.0000 mg | Freq: Once | INTRAMUSCULAR | Status: AC
  Administered 2019-02-15: 1 mg via INTRAVENOUS
  Filled 2019-02-15: qty 1

## 2019-02-15 MED ORDER — SODIUM CHLORIDE 0.9 % IV BOLUS
1000.0000 mL | Freq: Once | INTRAVENOUS | Status: AC
  Administered 2019-02-15: 1000 mL via INTRAVENOUS

## 2019-02-15 MED ORDER — TRAMADOL HCL 50 MG PO TABS: 50.0000 mg | ORAL_TABLET | Freq: Four times a day (QID) | ORAL | 0 refills | Status: DC | PRN

## 2019-02-15 MED ORDER — SODIUM CHLORIDE 0.9% FLUSH: 3.0000 mL | Freq: Once | INTRAVENOUS | Status: DC
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
## 2019-02-15 NOTE — ED Notes (Signed)
Patient arrived to room. Doctor and nurse at bedside.

## 2019-02-15 NOTE — ED Notes (Signed)
Patient not in room
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
## 2019-02-15 NOTE — Discharge Instructions (Addendum)
Take tylenol for pain   Take tramadol for severe pain   See your doctor   Return to ER if you have worse abdominal pain, vomiting, fever

## 2019-02-15 NOTE — ED Notes (Signed)
Patient transported to radiology

## 2019-03-17 ENCOUNTER — Emergency Department (HOSPITAL_COMMUNITY)
Admission: EM | Admit: 2019-03-17 | Discharge: 2019-03-17 | Disposition: A | Discharge status: Home / Self Care | Payer: Medicare Other | Attending: Emergency Medicine | Admitting: Emergency Medicine

## 2019-03-17 ENCOUNTER — Other Ambulatory Visit: Payer: Self-pay

## 2019-03-17 ENCOUNTER — Encounter (HOSPITAL_COMMUNITY): Payer: Self-pay | Admitting: Emergency Medicine

## 2019-03-17 DIAGNOSIS — R112 Nausea with vomiting, unspecified: Secondary | ICD-10-CM | POA: Diagnosis not present

## 2019-03-17 DIAGNOSIS — F17210 Nicotine dependence, cigarettes, uncomplicated: Secondary | ICD-10-CM | POA: Diagnosis not present

## 2019-03-17 DIAGNOSIS — R1084 Generalized abdominal pain: Secondary | ICD-10-CM | POA: Insufficient documentation

## 2019-03-17 DIAGNOSIS — F1290 Cannabis use, unspecified, uncomplicated: Secondary | ICD-10-CM | POA: Diagnosis not present

## 2019-03-17 LAB — COMPREHENSIVE METABOLIC PANEL
ALT: 23 U/L (ref 0–44)
AST: 26 U/L (ref 15–41)
Albumin: 4.2 g/dL (ref 3.5–5.0)
Alkaline Phosphatase: 77 U/L (ref 38–126)
Anion gap: 9 (ref 5–15)
BUN: 10 mg/dL (ref 6–20)
CO2: 25 mmol/L (ref 22–32)
Calcium: 9.5 mg/dL (ref 8.9–10.3)
Chloride: 101 mmol/L (ref 98–111)
Creatinine, Ser: 1.13 mg/dL (ref 0.61–1.24)
GFR calc Af Amer: >60 mL/min (ref >60)
GFR calc non Af Amer: >60 mL/min (ref >60)
Glucose, Bld: 107 mg/dL — ABNORMAL HIGH (ref 70–99)
Potassium: 4.5 mmol/L (ref 3.5–5.1)
Sodium: 135 mmol/L (ref 135–145)
Total Bilirubin: 0.6 mg/dL (ref 0.3–1.2)
Total Protein: 7.5 g/dL (ref 6.5–8.1)

## 2019-03-17 LAB — CBC WITH DIFFERENTIAL/PLATELET
Abs Immature Granulocytes: 0.03 10*3/uL (ref 0.00–0.07)
Basophils Absolute: 0.1 10*3/uL (ref 0.0–0.1)
Basophils Relative: 1 %
Eosinophils Absolute: 0.1 10*3/uL (ref 0.0–0.5)
Eosinophils Relative: 2 %
HCT: 45.5 % (ref 39.0–52.0)
Hemoglobin: 15.4 g/dL (ref 13.0–17.0)
Immature Granulocytes: 0 %
Lymphocytes Relative: 37 %
Lymphs Abs: 2.8 10*3/uL (ref 0.7–4.0)
MCH: 31.8 pg (ref 26.0–34.0)
MCHC: 33.8 g/dL (ref 30.0–36.0)
MCV: 93.8 fL (ref 80.0–100.0)
Monocytes Absolute: 0.6 10*3/uL (ref 0.1–1.0)
Monocytes Relative: 8 %
Neutro Abs: 4 10*3/uL (ref 1.7–7.7)
Neutrophils Relative %: 52 %
Platelets: 211 10*3/uL (ref 150–400)
RBC: 4.85 MIL/uL (ref 4.22–5.81)
RDW: 13.6 % (ref 11.5–15.5)
WBC: 7.7 10*3/uL (ref 4.0–10.5)
nRBC: 0 % (ref 0.0–0.2)

## 2019-03-17 LAB — LIPASE, BLOOD: Lipase: 31 U/L (ref 11–51)

## 2019-03-17 MED ORDER — KETOROLAC TROMETHAMINE 30 MG/ML IJ SOLN: 30.0000 mg | Freq: Once | INTRAMUSCULAR | Status: DC

## 2019-03-17 MED ORDER — TRAMADOL HCL 50 MG PO TABS: 50.0000 mg | ORAL_TABLET | Freq: Four times a day (QID) | ORAL | 0 refills | Status: DC | PRN

## 2019-03-17 MED ORDER — FAMOTIDINE 20 MG IN NS 100 ML IVPB: 20.0000 mg | Freq: Once | INTRAVENOUS | Status: DC

## 2019-03-17 MED ORDER — SODIUM CHLORIDE 0.9 % IV BOLUS
1000.0000 mL | Freq: Once | INTRAVENOUS | Status: AC
Start: 2019-03-17 — End: 2019-03-17
  Administered 2019-03-17: 1000 mL via INTRAVENOUS

## 2019-03-17 NOTE — Discharge Instructions (Addendum)
Please read attached information. If you experience any new or worsening signs or symptoms please return to the emergency room for evaluation. Please follow-up with your primary care provider or specialist as discussed. Please use medication prescribed only as directed and discontinue taking if you have any concerning signs or symptoms.   °

## 2019-03-17 NOTE — ED Notes (Signed)
Patient verbalized understanding of discharge instructions and denies any further needs or questions at this time. Provided coffee. VS stable. Patient ambulatory with steady gait.
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
## 2019-03-18 ENCOUNTER — Emergency Department (HOSPITAL_COMMUNITY)
Admission: EM | Admit: 2019-03-18 | Discharge: 2019-03-19 | Disposition: A | Discharge status: Home / Self Care | Payer: Medicare Other | Attending: Emergency Medicine | Admitting: Emergency Medicine

## 2019-03-18 ENCOUNTER — Other Ambulatory Visit: Payer: Self-pay

## 2019-03-18 ENCOUNTER — Encounter (HOSPITAL_COMMUNITY): Payer: Self-pay

## 2019-03-18 DIAGNOSIS — R109 Unspecified abdominal pain: Secondary | ICD-10-CM | POA: Diagnosis not present

## 2019-03-18 DIAGNOSIS — F17210 Nicotine dependence, cigarettes, uncomplicated: Secondary | ICD-10-CM | POA: Insufficient documentation

## 2019-03-18 DIAGNOSIS — M545 Low back pain, unspecified: Secondary | ICD-10-CM

## 2019-03-18 DIAGNOSIS — M5489 Other dorsalgia: Secondary | ICD-10-CM | POA: Diagnosis not present

## 2019-03-18 HISTORY — DX: Malingerer (conscious simulation): Z76.5

## 2019-03-18 NOTE — ED Triage Notes (Signed)
Per [facility], pt from home with a c/o back pain that began at 12:30 today. The pain is located in the center of his back and right flank. IV established with 100 mcg of Fentanyl administered. Pain 10 --> 6. No injuries reported today. Pt evaluated yesterday for dehydration.

## 2019-03-19 ENCOUNTER — Emergency Department (HOSPITAL_COMMUNITY): Payer: Medicare Other

## 2019-03-19 DIAGNOSIS — M545 Low back pain: Secondary | ICD-10-CM | POA: Diagnosis not present

## 2019-03-19 LAB — URINALYSIS, ROUTINE W REFLEX MICROSCOPIC
Bacteria, UA: NONE SEEN
Bilirubin Urine: NEGATIVE
Glucose, UA: NEGATIVE mg/dL
Ketones, ur: NEGATIVE mg/dL
Leukocytes,Ua: NEGATIVE
Nitrite: NEGATIVE
Protein, ur: NEGATIVE mg/dL
Specific Gravity, Urine: 1.035 — ABNORMAL HIGH (ref 1.005–1.030)
pH: 5 (ref 5.0–8.0)

## 2019-03-19 MED ORDER — KETOROLAC TROMETHAMINE 30 MG/ML IJ SOLN
15.0000 mg | Freq: Once | INTRAMUSCULAR | Status: AC
  Administered 2019-03-19: 15 mg via INTRAVENOUS
  Filled 2019-03-19: qty 1

## 2019-03-19 MED ORDER — METHOCARBAMOL 500 MG PO TABS
500.0000 mg | ORAL_TABLET | Freq: Once | ORAL | Status: AC
  Administered 2019-03-19: 500 mg via ORAL
  Filled 2019-03-19: qty 1

## 2019-03-19 MED ORDER — TIZANIDINE HCL 2 MG PO TABS: 2.0000 mg | ORAL_TABLET | Freq: Four times a day (QID) | ORAL | 0 refills | Status: DC | PRN

## 2019-03-19 MED ORDER — LACTATED RINGERS IV BOLUS
1000.0000 mL | Freq: Once | INTRAVENOUS | Status: AC
  Administered 2019-03-19: 01:00:00 1000 mL via INTRAVENOUS

## 2019-03-19 NOTE — ED Notes (Signed)
E-signature not available, verbalized understanding of DC instructions and prescriptions.  

## 2019-03-19 NOTE — ED Notes (Signed)
Patient transported to CT 
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
## 2019-03-20 ENCOUNTER — Other Ambulatory Visit: Payer: Self-pay

## 2019-03-20 ENCOUNTER — Encounter (HOSPITAL_COMMUNITY): Payer: Self-pay

## 2019-03-20 ENCOUNTER — Emergency Department (HOSPITAL_COMMUNITY)
Admission: EM | Admit: 2019-03-20 | Discharge: 2019-03-20 | Disposition: A | Discharge status: Home / Self Care | Payer: Medicare Other | Attending: Emergency Medicine | Admitting: Emergency Medicine

## 2019-03-20 DIAGNOSIS — F17210 Nicotine dependence, cigarettes, uncomplicated: Secondary | ICD-10-CM | POA: Diagnosis not present

## 2019-03-20 DIAGNOSIS — R1084 Generalized abdominal pain: Secondary | ICD-10-CM | POA: Diagnosis present

## 2019-03-20 DIAGNOSIS — Z79899 Other long term (current) drug therapy: Secondary | ICD-10-CM | POA: Diagnosis not present

## 2019-03-20 LAB — URINE CULTURE: Culture: NO GROWTH

## 2019-03-20 MED ORDER — SODIUM CHLORIDE 0.9 % IV BOLUS
1000.0000 mL | Freq: Once | INTRAVENOUS | Status: AC
  Administered 2019-03-20: 1000 mL via INTRAVENOUS

## 2019-03-20 MED ORDER — KETOROLAC TROMETHAMINE 30 MG/ML IJ SOLN
30.0000 mg | Freq: Once | INTRAMUSCULAR | Status: AC
  Administered 2019-03-20: 30 mg via INTRAVENOUS
  Filled 2019-03-20: qty 1

## 2019-03-20 MED ORDER — METHOCARBAMOL 500 MG PO TABS
500.0000 mg | ORAL_TABLET | Freq: Once | ORAL | Status: AC
  Administered 2019-03-20: 500 mg via ORAL
  Filled 2019-03-20: qty 1
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
## 2019-03-20 NOTE — Discharge Instructions (Addendum)
Please read attached information. If you experience any new or worsening signs or symptoms please return to the emergency room for evaluation. Please follow-up with your primary care provider or specialist as discussed.  °

## 2019-04-15 ENCOUNTER — Other Ambulatory Visit: Payer: Self-pay

## 2019-04-15 ENCOUNTER — Emergency Department (HOSPITAL_COMMUNITY)
Admission: EM | Admit: 2019-04-15 | Discharge: 2019-04-15 | Disposition: A | Discharge status: Home / Self Care | Payer: Medicare Other | Attending: Emergency Medicine | Admitting: Emergency Medicine

## 2019-04-15 ENCOUNTER — Encounter (HOSPITAL_COMMUNITY): Payer: Self-pay | Admitting: Emergency Medicine

## 2019-04-15 DIAGNOSIS — Z9049 Acquired absence of other specified parts of digestive tract: Secondary | ICD-10-CM | POA: Insufficient documentation

## 2019-04-15 DIAGNOSIS — G894 Chronic pain syndrome: Secondary | ICD-10-CM | POA: Insufficient documentation

## 2019-04-15 DIAGNOSIS — F17210 Nicotine dependence, cigarettes, uncomplicated: Secondary | ICD-10-CM | POA: Diagnosis not present

## 2019-04-15 DIAGNOSIS — R1084 Generalized abdominal pain: Secondary | ICD-10-CM | POA: Diagnosis present

## 2019-04-15 LAB — COMPREHENSIVE METABOLIC PANEL
ALT: 24 U/L (ref 0–44)
AST: 24 U/L (ref 15–41)
Albumin: 4.2 g/dL (ref 3.5–5.0)
Alkaline Phosphatase: 73 U/L (ref 38–126)
Anion gap: 8 (ref 5–15)
BUN: 10 mg/dL (ref 6–20)
CO2: 22 mmol/L (ref 22–32)
Calcium: 9.4 mg/dL (ref 8.9–10.3)
Chloride: 103 mmol/L (ref 98–111)
Creatinine, Ser: 1.11 mg/dL (ref 0.61–1.24)
GFR calc Af Amer: >60 mL/min (ref >60)
GFR calc non Af Amer: >60 mL/min (ref >60)
Glucose, Bld: 97 mg/dL (ref 70–99)
Potassium: 3.7 mmol/L (ref 3.5–5.1)
Sodium: 133 mmol/L — ABNORMAL LOW (ref 135–145)
Total Bilirubin: 1.2 mg/dL (ref 0.3–1.2)
Total Protein: 7.5 g/dL (ref 6.5–8.1)

## 2019-04-15 LAB — CBC
HCT: 43.2 % (ref 39.0–52.0)
Hemoglobin: 14.9 g/dL (ref 13.0–17.0)
MCH: 32 pg (ref 26.0–34.0)
MCHC: 34.5 g/dL (ref 30.0–36.0)
MCV: 92.9 fL (ref 80.0–100.0)
Platelets: 241 10*3/uL (ref 150–400)
RBC: 4.65 MIL/uL (ref 4.22–5.81)
RDW: 13.9 % (ref 11.5–15.5)
WBC: 7.6 10*3/uL (ref 4.0–10.5)
nRBC: 0 % (ref 0.0–0.2)

## 2019-04-15 LAB — LIPASE, BLOOD: Lipase: 36 U/L (ref 11–51)

## 2019-04-15 MED ORDER — SODIUM CHLORIDE 0.9% FLUSH: 3.0000 mL | Freq: Once | INTRAVENOUS | Status: DC

## 2019-04-15 MED ORDER — ONDANSETRON 4 MG PO TBDP
8.0000 mg | ORAL_TABLET | Freq: Once | ORAL | Status: DC
  Filled 2019-04-15: qty 2
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
## 2019-04-15 NOTE — ED Notes (Signed)
RN offered Zofran to pt. Pt refused. RN tried to educate pt about how zofran will help him. Pt still denied. RN tried to offer fluids. Pt denied says he needs IV fluids.

## 2019-04-17 ENCOUNTER — Encounter (HOSPITAL_COMMUNITY): Payer: Self-pay | Admitting: Emergency Medicine

## 2019-04-17 ENCOUNTER — Other Ambulatory Visit: Payer: Self-pay

## 2019-04-17 ENCOUNTER — Emergency Department (HOSPITAL_COMMUNITY)
Admission: EM | Admit: 2019-04-17 | Discharge: 2019-04-17 | Disposition: A | Discharge status: Home / Self Care | Payer: Medicare Other | Attending: Emergency Medicine | Admitting: Emergency Medicine

## 2019-04-17 DIAGNOSIS — R109 Unspecified abdominal pain: Secondary | ICD-10-CM | POA: Diagnosis present

## 2019-04-17 DIAGNOSIS — F17210 Nicotine dependence, cigarettes, uncomplicated: Secondary | ICD-10-CM | POA: Diagnosis not present

## 2019-04-17 DIAGNOSIS — E860 Dehydration: Secondary | ICD-10-CM | POA: Diagnosis not present

## 2019-04-17 LAB — CBC WITH DIFFERENTIAL/PLATELET
Abs Immature Granulocytes: 0.03 10*3/uL (ref 0.00–0.07)
Basophils Absolute: 0 10*3/uL (ref 0.0–0.1)
Basophils Relative: 1 %
Eosinophils Absolute: 0 10*3/uL (ref 0.0–0.5)
Eosinophils Relative: 1 %
HCT: 45.4 % (ref 39.0–52.0)
Hemoglobin: 15.7 g/dL (ref 13.0–17.0)
Immature Granulocytes: 0 %
Lymphocytes Relative: 38 %
Lymphs Abs: 3 10*3/uL (ref 0.7–4.0)
MCH: 32.4 pg (ref 26.0–34.0)
MCHC: 34.6 g/dL (ref 30.0–36.0)
MCV: 93.6 fL (ref 80.0–100.0)
Monocytes Absolute: 0.6 10*3/uL (ref 0.1–1.0)
Monocytes Relative: 7 %
Neutro Abs: 4.3 10*3/uL (ref 1.7–7.7)
Neutrophils Relative %: 53 %
Platelets: 237 10*3/uL (ref 150–400)
RBC: 4.85 MIL/uL (ref 4.22–5.81)
RDW: 13.5 % (ref 11.5–15.5)
WBC: 8 10*3/uL (ref 4.0–10.5)
nRBC: 0 % (ref 0.0–0.2)

## 2019-04-17 LAB — LIPASE, BLOOD: Lipase: 33 U/L (ref 11–51)

## 2019-04-17 LAB — URINALYSIS, ROUTINE W REFLEX MICROSCOPIC
Bacteria, UA: NONE SEEN
Bilirubin Urine: NEGATIVE
Glucose, UA: NEGATIVE mg/dL
Ketones, ur: 5 mg/dL — AB
Leukocytes,Ua: NEGATIVE
Nitrite: NEGATIVE
Protein, ur: 30 mg/dL — AB
Specific Gravity, Urine: 1.031 — ABNORMAL HIGH (ref 1.005–1.030)
pH: 5 (ref 5.0–8.0)

## 2019-04-17 LAB — COMPREHENSIVE METABOLIC PANEL
ALT: 22 U/L (ref 0–44)
AST: 24 U/L (ref 15–41)
Albumin: 4.6 g/dL (ref 3.5–5.0)
Alkaline Phosphatase: 75 U/L (ref 38–126)
Anion gap: 9 (ref 5–15)
BUN: 14 mg/dL (ref 6–20)
CO2: 23 mmol/L (ref 22–32)
Calcium: 9.3 mg/dL (ref 8.9–10.3)
Chloride: 102 mmol/L (ref 98–111)
Creatinine, Ser: 1.12 mg/dL (ref 0.61–1.24)
GFR calc Af Amer: >60 mL/min (ref >60)
GFR calc non Af Amer: >60 mL/min (ref >60)
Glucose, Bld: 105 mg/dL — ABNORMAL HIGH (ref 70–99)
Potassium: 3.6 mmol/L (ref 3.5–5.1)
Sodium: 134 mmol/L — ABNORMAL LOW (ref 135–145)
Total Bilirubin: 1.1 mg/dL (ref 0.3–1.2)
Total Protein: 8.1 g/dL (ref 6.5–8.1)

## 2019-04-17 MED ORDER — DICYCLOMINE HCL 10 MG/ML IM SOLN
20.0000 mg | Freq: Once | INTRAMUSCULAR | Status: AC
  Administered 2019-04-17: 20 mg via INTRAMUSCULAR
  Filled 2019-04-17: qty 2

## 2019-04-17 MED ORDER — SODIUM CHLORIDE 0.9 % IV BOLUS
1000.0000 mL | Freq: Once | INTRAVENOUS | Status: AC
  Administered 2019-04-17: 14:00:00 1000 mL via INTRAVENOUS

## 2019-04-17 NOTE — ED Notes (Signed)
Bed: WA03 Expected date:  Expected time:  Means of arrival:  Comments: 
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
## 2019-04-17 NOTE — ED Notes (Signed)
Patient found laying across the bed rail (which is up) with half of his body in the bed and the other half on the rolling table. RN told the patient not to lay in that position and educated the patient on safety. Patient moved back in the bed and  stated that he wouldn't fall and if he didn't get pain medication soon he would get back in that same position. Patient re-educated on safe but education was refused.
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
## 2019-04-17 NOTE — Discharge Instructions (Addendum)
Make sure you are drinking plenty of fluids.  Follow-up with a primary care doctor for checkup in 1 to 2 weeks.

## 2019-04-18 LAB — HIV ANTIBODY (ROUTINE TESTING W REFLEX): HIV Screen 4th Generation wRfx: NONREACTIVE

## 2019-04-22 ENCOUNTER — Other Ambulatory Visit: Payer: Self-pay

## 2019-04-22 ENCOUNTER — Ambulatory Visit (INDEPENDENT_AMBULATORY_CARE_PROVIDER_SITE_OTHER): Payer: Medicare Other | Admitting: Family Medicine

## 2019-04-22 DIAGNOSIS — E860 Dehydration: Secondary | ICD-10-CM | POA: Insufficient documentation

## 2019-04-22 DIAGNOSIS — Z720 Tobacco use: Secondary | ICD-10-CM | POA: Diagnosis not present

## 2019-04-22 DIAGNOSIS — G8929 Other chronic pain: Secondary | ICD-10-CM | POA: Diagnosis not present

## 2019-04-22 DIAGNOSIS — R1031 Right lower quadrant pain: Secondary | ICD-10-CM
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
## 2019-04-29 ENCOUNTER — Other Ambulatory Visit: Payer: Self-pay

## 2019-04-29 ENCOUNTER — Emergency Department (HOSPITAL_COMMUNITY)
Admission: EM | Admit: 2019-04-29 | Discharge: 2019-04-29 | Disposition: A | Discharge status: Home / Self Care | Payer: Medicare Other | Attending: Emergency Medicine | Admitting: Emergency Medicine

## 2019-04-29 ENCOUNTER — Encounter (HOSPITAL_COMMUNITY): Payer: Self-pay | Admitting: Emergency Medicine

## 2019-04-29 DIAGNOSIS — F17210 Nicotine dependence, cigarettes, uncomplicated: Secondary | ICD-10-CM | POA: Insufficient documentation

## 2019-04-29 DIAGNOSIS — G8929 Other chronic pain: Secondary | ICD-10-CM | POA: Diagnosis not present

## 2019-04-29 DIAGNOSIS — F1220 Cannabis dependence, uncomplicated: Secondary | ICD-10-CM | POA: Insufficient documentation

## 2019-04-29 DIAGNOSIS — Z79899 Other long term (current) drug therapy: Secondary | ICD-10-CM | POA: Diagnosis not present

## 2019-04-29 DIAGNOSIS — R109 Unspecified abdominal pain: Secondary | ICD-10-CM | POA: Insufficient documentation

## 2019-04-29 MED ORDER — HALOPERIDOL LACTATE 5 MG/ML IJ SOLN
5.0000 mg | Freq: Once | INTRAMUSCULAR | Status: AC
  Administered 2019-04-29: 5 mg via INTRAMUSCULAR
  Filled 2019-04-29: qty 1

## 2019-04-29 MED ORDER — SODIUM CHLORIDE 0.9% FLUSH: 3.0000 mL | Freq: Once | INTRAVENOUS | Status: DC

## 2019-04-29 NOTE — ED Triage Notes (Signed)
Patient is complaining of abdominal pain for the last two day. The pain is on the right side radiating to left. Patient has not had a bowel movement since yesterday morning. Patient has one episode of vomiting and diarhea yesterday morning.

## 2019-04-29 NOTE — ED Notes (Signed)
Pt doing back bend over bed rail. He states that it feels better to have pressure on his back. Pt redirected. Attempted to roll up a sheet to place behind his back, he said that wasn't enough and that he can do what he wants. PA at bedside.

## 2019-04-29 NOTE — ED Notes (Signed)
Patient found with his full body weight leaning against the side rail. Pt told not to do that as his weight will break the bed and he will fall and hurt himself. Patient arguing with this RN and and Agricultural consultant stating he will do what he wants and he "does this every time he comes here"  Patient rude and uncooperative with staff and at high risk for harming himself by breaking the bed or falling from bed. Charge RN aware of situation
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
## 2019-04-29 NOTE — ED Notes (Signed)
Patient found laying in bed with hips on railing and legs up in the air. Pt instructed to lay flat down in bed due to potential of injury to self or others along with destruction to hospital property. PT stated "I do this all the time." Mia PA notified.

## 2019-04-29 NOTE — ED Notes (Signed)
ED Provider at bedside. 

## 2019-04-29 NOTE — Discharge Instructions (Addendum)
Thank you for allowing me to care for you today in the Emergency Department.   Follow up with primary care.  The chronic pain in your abdomen needs to be addressed by a gastroenterologist.    Return to the emergency department if you develop persistent vomiting, black or bloody stools or vomiting, high fever with abdominal pain, if you start peeing or pooping on yourself, or other new, concerning symptoms.

## 2019-04-29 NOTE — ED Notes (Signed)
Pt agreed to lay in bed for PA's exam.

## 2019-04-29 NOTE — ED Notes (Signed)
Pt found by Probation officer and RN Fara Chute and [employer]	[place]. Pt had both legs straight up in the air leaning on railing of bed. Pt asked to lay correctly in the bed due to safety concerns. Pt refused and said "I do this all the time. I ain't gonna fall". Pt again asked to lay down to avoid getting hurt, pt again refused.

## 2019-04-29 NOTE — ED Notes (Signed)
Bed: OZ36 Expected date:  Expected time:  Means of arrival:  Comments: 53 yo M/ Abd pain

## 2019-04-29 NOTE — ED Notes (Signed)
Pt undressed in gown from waist up- does not want to take pants off. Pt placed on cardiac monitoring and has phone, call bell and urinal at bedside. Pt is aware a urine specimen is needed.

## 2019-04-29 NOTE — ED Notes (Signed)
Pt in room with his head on the bedside table,back on the side rail and his feet on the opposite side rail doing a back bend. RN Maylon Cos and this RN attempted to put patient back on the bed as this was a HIGH fall risk. Pt screaming he has to have pressure at his back. Pt writhing in bed and arguing about staying laying on the bed.

## 2019-04-29 NOTE — ED Notes (Signed)
RN reviewed d/c paperwork and pt requested a cup of coffee. Pt given coffee with his paperwork and stated he was going to take the bus home.  Pt ambulatory without difficulty and without writhing in pain or screaming.

## 2019-04-29 NOTE — ED Notes (Signed)
Patient writhing and hollering on bed about his abdominal pain and states he has to have something behind his back.

## 2019-05-20 ENCOUNTER — Encounter (HOSPITAL_COMMUNITY): Payer: Self-pay

## 2019-05-20 ENCOUNTER — Other Ambulatory Visit: Payer: Self-pay

## 2019-05-20 ENCOUNTER — Emergency Department (HOSPITAL_COMMUNITY)
Admission: EM | Admit: 2019-05-20 | Discharge: 2019-05-20 | Disposition: A | Discharge status: Home / Self Care | Payer: Medicare Other | Attending: Emergency Medicine | Admitting: Emergency Medicine

## 2019-05-20 DIAGNOSIS — K5900 Constipation, unspecified: Secondary | ICD-10-CM

## 2019-05-20 DIAGNOSIS — F17210 Nicotine dependence, cigarettes, uncomplicated: Secondary | ICD-10-CM | POA: Insufficient documentation

## 2019-05-20 DIAGNOSIS — R1084 Generalized abdominal pain: Secondary | ICD-10-CM | POA: Diagnosis not present

## 2019-05-20 LAB — COMPREHENSIVE METABOLIC PANEL
ALT: 26 U/L (ref 0–44)
AST: 26 U/L (ref 15–41)
Albumin: 4.1 g/dL (ref 3.5–5.0)
Alkaline Phosphatase: 61 U/L (ref 38–126)
Anion gap: 12 (ref 5–15)
BUN: 9 mg/dL (ref 6–20)
CO2: 21 mmol/L — ABNORMAL LOW (ref 22–32)
Calcium: 9.1 mg/dL (ref 8.9–10.3)
Chloride: 102 mmol/L (ref 98–111)
Creatinine, Ser: 1.01 mg/dL (ref 0.61–1.24)
GFR calc Af Amer: >60 mL/min (ref >60)
GFR calc non Af Amer: >60 mL/min (ref >60)
Glucose, Bld: 185 mg/dL — ABNORMAL HIGH (ref 70–99)
Potassium: 3.5 mmol/L (ref 3.5–5.1)
Sodium: 135 mmol/L (ref 135–145)
Total Bilirubin: 0.7 mg/dL (ref 0.3–1.2)
Total Protein: 7.3 g/dL (ref 6.5–8.1)

## 2019-05-20 LAB — CBC WITH DIFFERENTIAL/PLATELET
Abs Immature Granulocytes: 0.02 10*3/uL (ref 0.00–0.07)
Basophils Absolute: 0 10*3/uL (ref 0.0–0.1)
Basophils Relative: 1 %
Eosinophils Absolute: 0.1 10*3/uL (ref 0.0–0.5)
Eosinophils Relative: 2 %
HCT: 41 % (ref 39.0–52.0)
Hemoglobin: 14.1 g/dL (ref 13.0–17.0)
Immature Granulocytes: 0 %
Lymphocytes Relative: 43 %
Lymphs Abs: 3.2 10*3/uL (ref 0.7–4.0)
MCH: 32.7 pg (ref 26.0–34.0)
MCHC: 34.4 g/dL (ref 30.0–36.0)
MCV: 95.1 fL (ref 80.0–100.0)
Monocytes Absolute: 0.6 10*3/uL (ref 0.1–1.0)
Monocytes Relative: 8 %
Neutro Abs: 3.6 10*3/uL (ref 1.7–7.7)
Neutrophils Relative %: 46 %
Platelets: 230 10*3/uL (ref 150–400)
RBC: 4.31 MIL/uL (ref 4.22–5.81)
RDW: 13.8 % (ref 11.5–15.5)
WBC: 7.6 10*3/uL (ref 4.0–10.5)
nRBC: 0 % (ref 0.0–0.2)

## 2019-05-20 LAB — LIPASE, BLOOD: Lipase: 33 U/L (ref 11–51)

## 2019-05-20 MED ORDER — POLYETHYLENE GLYCOL 3350 17 G PO PACK: 17.0000 g | PACK | Freq: Every day | ORAL | 0 refills | Status: AC

## 2019-05-20 MED ORDER — SODIUM CHLORIDE 0.9 % IV BOLUS
1000.0000 mL | Freq: Once | INTRAVENOUS | Status: AC
  Administered 2019-05-20: 1000 mL via INTRAVENOUS

## 2019-05-20 NOTE — ED Triage Notes (Signed)
Per EMS, patient coming from home with complaints of right and left upper abdominal pain for the past week. Patient states that the pain feels as if he is bloated. Tenderness noted on palpation. Endorses nausea and vomiting, but denies diarrhea. Patient states he had one episode of vomiting.

## 2019-05-20 NOTE — ED Notes (Signed)
Bed: GK81 Expected date:  Expected time:  Means of arrival:  Comments: 53 yo M/ abd pain

## 2019-05-20 NOTE — ED Notes (Signed)
Pt d/c home per MD order. Discharge summary reviewed with pt, RX provided. Ambulatory off unit. No s/s of acute distress at discharge.
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
## 2019-07-04 ENCOUNTER — Other Ambulatory Visit: Payer: Self-pay

## 2019-07-04 ENCOUNTER — Ambulatory Visit (INDEPENDENT_AMBULATORY_CARE_PROVIDER_SITE_OTHER): Payer: Medicare Other | Admitting: Family Medicine

## 2019-07-04 ENCOUNTER — Encounter: Payer: Self-pay | Admitting: Family Medicine

## 2019-07-04 VITALS — BP 116/78 | HR 66 | Temp 98.0°F | Ht 67.75 in | Wt 134.6 lb

## 2019-07-04 DIAGNOSIS — Z125 Encounter for screening for malignant neoplasm of prostate: Secondary | ICD-10-CM | POA: Diagnosis not present

## 2019-07-04 DIAGNOSIS — Z0000 Encounter for general adult medical examination without abnormal findings: Secondary | ICD-10-CM | POA: Diagnosis not present

## 2019-07-04 DIAGNOSIS — Z23 Encounter for immunization: Secondary | ICD-10-CM | POA: Diagnosis not present

## 2019-07-04 LAB — HEPATIC FUNCTION PANEL
ALT: 32 U/L (ref 0–53)
AST: 25 U/L (ref 0–37)
Albumin: 4.3 g/dL (ref 3.5–5.2)
Alkaline Phosphatase: 69 U/L (ref 39–117)
Bilirubin, Direct: 0.1 mg/dL (ref 0.0–0.3)
Total Bilirubin: 0.5 mg/dL (ref 0.2–1.2)
Total Protein: 7 g/dL (ref 6.0–8.3)

## 2019-07-04 LAB — LIPID PANEL
Cholesterol: 146 mg/dL (ref 0–200)
HDL: 61.9 mg/dL (ref >39.00)
LDL Cholesterol: 72 mg/dL (ref 0–99)
NonHDL: 84.38
Total CHOL/HDL Ratio: 2
Triglycerides: 62 mg/dL (ref 0.0–149.0)
VLDL: 12.4 mg/dL (ref 0.0–40.0)

## 2019-07-04 LAB — TSH: TSH: 1.71 u[IU]/mL (ref 0.35–4.50)

## 2019-07-04 LAB — BASIC METABOLIC PANEL
BUN: 7 mg/dL (ref 6–23)
CO2: 29 mEq/L (ref 19–32)
Calcium: 9.5 mg/dL (ref 8.4–10.5)
Chloride: 103 mEq/L (ref 96–112)
Creatinine, Ser: 0.98 mg/dL (ref 0.40–1.50)
GFR: 95.9 mL/min (ref >60.00)
Glucose, Bld: 98 mg/dL (ref 70–99)
Potassium: 4.2 mEq/L (ref 3.5–5.1)
Sodium: 138 mEq/L (ref 135–145)

## 2019-07-04 LAB — HEMOGLOBIN A1C: Hgb A1c MFr Bld: 5.9 % (ref 4.6–6.5)

## 2019-07-04 LAB — PSA: PSA: 2.37 ng/mL (ref 0.10–4.00)

## 2019-07-04 MED ORDER — TRAMADOL HCL 50 MG PO TABS: 50.0000 mg | ORAL_TABLET | Freq: Two times a day (BID) | ORAL | 2 refills | Status: AC | PRN

## 2019-07-04 MED FILL — traMADol HCL 50 MG TABS: 50 | 30 days supply | Qty: 60 | Fill #0
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
## 2019-07-28 ENCOUNTER — Other Ambulatory Visit: Payer: Self-pay

## 2019-07-28 ENCOUNTER — Encounter (HOSPITAL_COMMUNITY): Payer: Self-pay | Admitting: Emergency Medicine

## 2019-07-28 ENCOUNTER — Emergency Department (HOSPITAL_COMMUNITY)
Admission: EM | Admit: 2019-07-28 | Discharge: 2019-07-29 | Disposition: A | Discharge status: Home / Self Care | Payer: Medicare Other | Attending: Emergency Medicine | Admitting: Emergency Medicine

## 2019-07-28 DIAGNOSIS — R109 Unspecified abdominal pain: Secondary | ICD-10-CM | POA: Diagnosis not present

## 2019-07-28 DIAGNOSIS — F17210 Nicotine dependence, cigarettes, uncomplicated: Secondary | ICD-10-CM | POA: Diagnosis not present

## 2019-07-28 DIAGNOSIS — G8929 Other chronic pain: Secondary | ICD-10-CM | POA: Diagnosis not present

## 2019-07-28 DIAGNOSIS — W25XXXA Contact with sharp glass, initial encounter: Secondary | ICD-10-CM | POA: Diagnosis not present

## 2019-07-28 DIAGNOSIS — F1290 Cannabis use, unspecified, uncomplicated: Secondary | ICD-10-CM | POA: Diagnosis not present

## 2019-07-28 DIAGNOSIS — Y999 Unspecified external cause status: Secondary | ICD-10-CM | POA: Insufficient documentation

## 2019-07-28 DIAGNOSIS — Y939 Activity, unspecified: Secondary | ICD-10-CM | POA: Diagnosis not present

## 2019-07-28 DIAGNOSIS — S91012A Laceration without foreign body, left ankle, initial encounter: Secondary | ICD-10-CM | POA: Insufficient documentation

## 2019-07-28 DIAGNOSIS — Y92031 Bathroom in apartment as the place of occurrence of the external cause: Secondary | ICD-10-CM | POA: Diagnosis not present

## 2019-07-28 MED ORDER — LIDOCAINE-EPINEPHRINE (PF) 2 %-1:200000 IJ SOLN
20.0000 mL | Freq: Once | INTRAMUSCULAR | Status: AC
  Administered 2019-07-28: 20 mL via INTRADERMAL
  Filled 2019-07-28: qty 20

## 2019-07-28 MED ORDER — ONDANSETRON 4 MG PO TBDP
8.0000 mg | ORAL_TABLET | Freq: Once | ORAL | Status: AC
  Administered 2019-07-28: 8 mg via ORAL
  Filled 2019-07-28: qty 2

## 2019-07-28 MED ORDER — TRAMADOL HCL 50 MG PO TABS
50.0000 mg | ORAL_TABLET | Freq: Once | ORAL | Status: AC
  Administered 2019-07-28: 50 mg via ORAL
  Filled 2019-07-28: qty 1

## 2019-07-28 NOTE — ED Triage Notes (Signed)
Pt reports cutting his lower L leg on a mirror this am around 10. States he had tetanus shot last month.
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
## 2019-07-28 NOTE — Discharge Instructions (Signed)
Keep area clean by washing with soap and water daily. Do not submerge in water or scrub stitches Apply a bandage at least once daily, change more often if it is dirty Watch for signs of infection (redness, drainage, worsening pain) Take Tylenol for pain as needed Have stitches removed in 10-14 days

## 2019-07-29 NOTE — ED Notes (Signed)
Pt's wound irrigated and new dressing applied.

## 2019-07-29 NOTE — ED Notes (Signed)
Patient verbalizes understanding of discharge instructions. Opportunity for questioning and answers were provided. Armband removed by staff, pt discharged from ED ambulatory. Pt refused to have discharge vitals.

## 2019-08-01 ENCOUNTER — Emergency Department (HOSPITAL_COMMUNITY)
Admission: EM | Admit: 2019-08-01 | Discharge: 2019-08-01 | Disposition: A | Discharge status: Home / Self Care | Payer: Medicare Other | Attending: Emergency Medicine | Admitting: Emergency Medicine

## 2019-08-01 ENCOUNTER — Other Ambulatory Visit: Payer: Self-pay

## 2019-08-01 DIAGNOSIS — K5900 Constipation, unspecified: Secondary | ICD-10-CM | POA: Insufficient documentation

## 2019-08-01 DIAGNOSIS — R1084 Generalized abdominal pain: Secondary | ICD-10-CM

## 2019-08-01 DIAGNOSIS — F17210 Nicotine dependence, cigarettes, uncomplicated: Secondary | ICD-10-CM | POA: Diagnosis not present

## 2019-08-01 LAB — URINALYSIS, ROUTINE W REFLEX MICROSCOPIC
Bacteria, UA: NONE SEEN
Bilirubin Urine: NEGATIVE
Glucose, UA: NEGATIVE mg/dL
Ketones, ur: 5 mg/dL — AB
Leukocytes,Ua: NEGATIVE
Nitrite: NEGATIVE
Protein, ur: 100 mg/dL — AB
Specific Gravity, Urine: 1.032 — ABNORMAL HIGH (ref 1.005–1.030)
pH: 5 (ref 5.0–8.0)

## 2019-08-01 LAB — COMPREHENSIVE METABOLIC PANEL
ALT: 18 U/L (ref 0–44)
AST: 22 U/L (ref 15–41)
Albumin: 3.7 g/dL (ref 3.5–5.0)
Alkaline Phosphatase: 68 U/L (ref 38–126)
Anion gap: 12 (ref 5–15)
BUN: 10 mg/dL (ref 6–20)
CO2: 22 mmol/L (ref 22–32)
Calcium: 9.3 mg/dL (ref 8.9–10.3)
Chloride: 102 mmol/L (ref 98–111)
Creatinine, Ser: 1.09 mg/dL (ref 0.61–1.24)
GFR calc Af Amer: >60 mL/min (ref >60)
GFR calc non Af Amer: >60 mL/min (ref >60)
Glucose, Bld: 116 mg/dL — ABNORMAL HIGH (ref 70–99)
Potassium: 3.8 mmol/L (ref 3.5–5.1)
Sodium: 136 mmol/L (ref 135–145)
Total Bilirubin: 0.7 mg/dL (ref 0.3–1.2)
Total Protein: 6.8 g/dL (ref 6.5–8.1)

## 2019-08-01 LAB — CBC
HCT: 40.8 % (ref 39.0–52.0)
Hemoglobin: 13.5 g/dL (ref 13.0–17.0)
MCH: 31.6 pg (ref 26.0–34.0)
MCHC: 33.1 g/dL (ref 30.0–36.0)
MCV: 95.6 fL (ref 80.0–100.0)
Platelets: 218 10*3/uL (ref 150–400)
RBC: 4.27 MIL/uL (ref 4.22–5.81)
RDW: 12.9 % (ref 11.5–15.5)
WBC: 12.1 10*3/uL — ABNORMAL HIGH (ref 4.0–10.5)
nRBC: 0 % (ref 0.0–0.2)

## 2019-08-01 LAB — LIPASE, BLOOD: Lipase: 35 U/L (ref 11–51)

## 2019-08-01 MED ORDER — ALUM & MAG HYDROXIDE-SIMETH 200-200-20 MG/5ML PO SUSP
30.0000 mL | Freq: Once | ORAL | Status: AC
  Administered 2019-08-01: 30 mL via ORAL
  Filled 2019-08-01: qty 30

## 2019-08-01 MED ORDER — LIDOCAINE VISCOUS HCL 2 % MT SOLN
15.0000 mL | Freq: Once | OROMUCOSAL | Status: AC
  Administered 2019-08-01: 15 mL via ORAL
  Filled 2019-08-01: qty 15

## 2019-08-01 MED ORDER — ONDANSETRON HCL 4 MG/2ML IJ SOLN
4.0000 mg | Freq: Once | INTRAMUSCULAR | Status: AC
  Administered 2019-08-01: 4 mg via INTRAVENOUS
  Filled 2019-08-01: qty 2

## 2019-08-01 MED ORDER — POLYETHYLENE GLYCOL 3350 17 G PO PACK: 17.0000 g | PACK | Freq: Every day | ORAL | 0 refills | Status: DC

## 2019-08-01 MED ORDER — SODIUM CHLORIDE 0.9 % IV BOLUS
1000.0000 mL | Freq: Once | INTRAVENOUS | Status: AC
  Administered 2019-08-01: 1000 mL via INTRAVENOUS

## 2019-08-01 MED FILL — traMADol HCL 50 MG TABS: 50 | 30 days supply | Qty: 60 | Fill #1

## 2019-08-01 NOTE — ED Triage Notes (Signed)
Pt c/o abd pain x1 week.

## 2019-08-01 NOTE — ED Notes (Signed)
Patient verbalizes understanding of discharge instructions. Opportunity for questioning and answers were provided. Pt discharged from ED. 
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
## 2019-08-01 NOTE — Discharge Instructions (Signed)
Your symptoms and work-up today were consistent with some dehydration with constipation.  As we discussed, please avoid using more of the narcotic pain medication such as the tramadol and instead use the MiraLAX medication to help with your constipation.  Please try and stay hydrated and use your home nausea medicine for this.  If any symptoms change or worsen, please return to the nearest emergency department.

## 2019-08-01 NOTE — ED Notes (Signed)
Pt ambulatory in triage. 

## 2019-08-02 ENCOUNTER — Encounter (HOSPITAL_COMMUNITY): Payer: Self-pay | Admitting: Emergency Medicine

## 2019-08-02 ENCOUNTER — Emergency Department (HOSPITAL_COMMUNITY)
Admission: EM | Admit: 2019-08-02 | Discharge: 2019-08-03 | Disposition: A | Discharge status: Home / Self Care | Payer: Medicare Other | Attending: Emergency Medicine | Admitting: Emergency Medicine

## 2019-08-02 DIAGNOSIS — R1084 Generalized abdominal pain: Secondary | ICD-10-CM | POA: Diagnosis not present

## 2019-08-02 DIAGNOSIS — R1110 Vomiting, unspecified: Secondary | ICD-10-CM | POA: Insufficient documentation

## 2019-08-02 DIAGNOSIS — F17210 Nicotine dependence, cigarettes, uncomplicated: Secondary | ICD-10-CM | POA: Diagnosis not present

## 2019-08-02 LAB — CBC
HCT: 40.9 % (ref 39.0–52.0)
Hemoglobin: 13.8 g/dL (ref 13.0–17.0)
MCH: 31.8 pg (ref 26.0–34.0)
MCHC: 33.7 g/dL (ref 30.0–36.0)
MCV: 94.2 fL (ref 80.0–100.0)
Platelets: 236 10*3/uL (ref 150–400)
RBC: 4.34 MIL/uL (ref 4.22–5.81)
RDW: 12.8 % (ref 11.5–15.5)
WBC: 12.1 10*3/uL — ABNORMAL HIGH (ref 4.0–10.5)
nRBC: 0 % (ref 0.0–0.2)

## 2019-08-02 LAB — URINALYSIS, ROUTINE W REFLEX MICROSCOPIC
Bacteria, UA: NONE SEEN
Glucose, UA: NEGATIVE mg/dL
Hgb urine dipstick: NEGATIVE
Ketones, ur: 5 mg/dL — AB
Leukocytes,Ua: NEGATIVE
Nitrite: NEGATIVE
Protein, ur: 100 mg/dL — AB
Renal Epithelial: <1
Specific Gravity, Urine: 1.036 — ABNORMAL HIGH (ref 1.005–1.030)
pH: 5 (ref 5.0–8.0)

## 2019-08-02 LAB — COMPREHENSIVE METABOLIC PANEL
ALT: 19 U/L (ref 0–44)
AST: 23 U/L (ref 15–41)
Albumin: 4 g/dL (ref 3.5–5.0)
Alkaline Phosphatase: 62 U/L (ref 38–126)
Anion gap: 12 (ref 5–15)
BUN: 8 mg/dL (ref 6–20)
CO2: 21 mmol/L — ABNORMAL LOW (ref 22–32)
Calcium: 9.3 mg/dL (ref 8.9–10.3)
Chloride: 103 mmol/L (ref 98–111)
Creatinine, Ser: 1.04 mg/dL (ref 0.61–1.24)
GFR calc Af Amer: >60 mL/min (ref >60)
GFR calc non Af Amer: >60 mL/min (ref >60)
Glucose, Bld: 126 mg/dL — ABNORMAL HIGH (ref 70–99)
Potassium: 3.8 mmol/L (ref 3.5–5.1)
Sodium: 136 mmol/L (ref 135–145)
Total Bilirubin: 0.8 mg/dL (ref 0.3–1.2)
Total Protein: 7.4 g/dL (ref 6.5–8.1)

## 2019-08-02 LAB — URINE CULTURE: Culture: <10000 — AB

## 2019-08-02 LAB — LIPASE, BLOOD: Lipase: 27 U/L (ref 11–51)

## 2019-08-02 MED ORDER — SODIUM CHLORIDE 0.9% FLUSH: 3.0000 mL | Freq: Once | INTRAVENOUS | Status: DC
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
## 2019-08-03 ENCOUNTER — Encounter (HOSPITAL_COMMUNITY): Payer: Self-pay | Admitting: Emergency Medicine

## 2019-08-03 ENCOUNTER — Other Ambulatory Visit: Payer: Self-pay

## 2019-08-03 ENCOUNTER — Emergency Department (HOSPITAL_COMMUNITY)
Admission: EM | Admit: 2019-08-03 | Discharge: 2019-08-03 | Discharge status: Against Medical Advice | Payer: Medicare Other | Source: Home / Self Care

## 2019-08-03 DIAGNOSIS — R1084 Generalized abdominal pain: Secondary | ICD-10-CM | POA: Diagnosis not present

## 2019-08-03 DIAGNOSIS — R1031 Right lower quadrant pain: Secondary | ICD-10-CM | POA: Insufficient documentation

## 2019-08-03 DIAGNOSIS — Z5321 Procedure and treatment not carried out due to patient leaving prior to being seen by health care provider: Secondary | ICD-10-CM | POA: Insufficient documentation

## 2019-08-03 MED ORDER — ALUM & MAG HYDROXIDE-SIMETH 200-200-20 MG/5ML PO SUSP
30.0000 mL | Freq: Once | ORAL | Status: AC
  Administered 2019-08-03: 30 mL via ORAL
  Filled 2019-08-03: qty 30

## 2019-08-03 MED ORDER — SUCRALFATE 1 G PO TABS
1.0000 g | ORAL_TABLET | Freq: Once | ORAL | Status: AC
  Administered 2019-08-03: 1 g via ORAL
  Filled 2019-08-03: qty 1

## 2019-08-03 MED ORDER — HALOPERIDOL LACTATE 5 MG/ML IJ SOLN
2.0000 mg | Freq: Once | INTRAMUSCULAR | Status: AC
  Administered 2019-08-03: 2 mg via INTRAVENOUS
  Filled 2019-08-03: qty 1

## 2019-08-03 NOTE — ED Notes (Signed)
Pt called x 3 for treatment room. No answer.
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
## 2019-08-03 NOTE — ED Notes (Signed)
Pt verbalized understanding of d/c instructions, follow up and medications.  No further questions. Pt to [place] via [place]

## 2019-08-03 NOTE — ED Triage Notes (Signed)
Per EMS-complaining of abdominal pain which started 2 days ago-thinks he might be constipated-right upper and lower quadrant pain-went to [facility] last night but thinks he was not taken care of

## 2019-08-04 ENCOUNTER — Emergency Department (HOSPITAL_COMMUNITY)
Admission: EM | Admit: 2019-08-04 | Discharge: 2019-08-04 | Disposition: A | Discharge status: Home / Self Care | Payer: Medicare Other | Attending: Emergency Medicine | Admitting: Emergency Medicine

## 2019-08-04 ENCOUNTER — Encounter (HOSPITAL_COMMUNITY): Payer: Self-pay

## 2019-08-04 ENCOUNTER — Other Ambulatory Visit: Payer: Self-pay

## 2019-08-04 DIAGNOSIS — R109 Unspecified abdominal pain: Secondary | ICD-10-CM | POA: Diagnosis not present

## 2019-08-04 DIAGNOSIS — K5900 Constipation, unspecified: Secondary | ICD-10-CM | POA: Insufficient documentation

## 2019-08-04 DIAGNOSIS — Z5321 Procedure and treatment not carried out due to patient leaving prior to being seen by health care provider: Secondary | ICD-10-CM | POA: Insufficient documentation

## 2019-08-04 LAB — COMPREHENSIVE METABOLIC PANEL
ALT: 20 U/L (ref 0–44)
AST: 28 U/L (ref 15–41)
Albumin: 4.2 g/dL (ref 3.5–5.0)
Alkaline Phosphatase: 65 U/L (ref 38–126)
Anion gap: 12 (ref 5–15)
BUN: 14 mg/dL (ref 6–20)
CO2: 23 mmol/L (ref 22–32)
Calcium: 9.6 mg/dL (ref 8.9–10.3)
Chloride: 101 mmol/L (ref 98–111)
Creatinine, Ser: 1.07 mg/dL (ref 0.61–1.24)
GFR calc Af Amer: >60 mL/min (ref >60)
GFR calc non Af Amer: >60 mL/min (ref >60)
Glucose, Bld: 105 mg/dL — ABNORMAL HIGH (ref 70–99)
Potassium: 4 mmol/L (ref 3.5–5.1)
Sodium: 136 mmol/L (ref 135–145)
Total Bilirubin: 1 mg/dL (ref 0.3–1.2)
Total Protein: 8.6 g/dL — ABNORMAL HIGH (ref 6.5–8.1)

## 2019-08-04 LAB — LIPASE, BLOOD: Lipase: 27 U/L (ref 11–51)

## 2019-08-04 LAB — CBC
HCT: 45.9 % (ref 39.0–52.0)
Hemoglobin: 15.1 g/dL (ref 13.0–17.0)
MCH: 31.5 pg (ref 26.0–34.0)
MCHC: 32.9 g/dL (ref 30.0–36.0)
MCV: 95.6 fL (ref 80.0–100.0)
Platelets: 249 10*3/uL (ref 150–400)
RBC: 4.8 MIL/uL (ref 4.22–5.81)
RDW: 12.9 % (ref 11.5–15.5)
WBC: 9.4 10*3/uL (ref 4.0–10.5)
nRBC: 0 % (ref 0.0–0.2)

## 2019-08-04 MED ORDER — SODIUM CHLORIDE 0.9% FLUSH: 3.0000 mL | Freq: Once | INTRAVENOUS | Status: DC

## 2019-08-04 NOTE — ED Triage Notes (Signed)
Per EMS- patient is from home. EMS reported that the patient has been seen daily x 3 days for abdominal pain and constipation,but left AMA yesterday due to wait times.

## 2019-09-11 MED FILL — traMADol HCL 50 MG TABS: 50 | 30 days supply | Qty: 60 | Fill #2

## 2019-09-29 ENCOUNTER — Telehealth: Payer: Self-pay | Admitting: Family Medicine

## 2019-09-29 NOTE — Telephone Encounter (Signed)
Medication Refill - Medication: traMADol (ULTRAM) 50 MG tablet    Has the patient contacted their pharmacy? No. (Agent: If no, request that the patient contact the pharmacy for the refill.) (Agent: If yes, when and what did the pharmacy advise?)  Preferred Pharmacy (with phone number or street name):  [place], [address] [phone] (Phone) [phone] (Fax)     Agent: Please be advised that RX refills may take up to 3 business days. We ask that you follow-up with your pharmacy.

## 2019-09-30 MED ORDER — TRAMADOL HCL 50 MG PO TABS: 50.0000 mg | ORAL_TABLET | Freq: Four times a day (QID) | ORAL | 2 refills | Status: DC | PRN

## 2019-09-30 MED FILL — traMADol HCL 50 MG TABS: 50 | 15 days supply | Qty: 60 | Fill #0

## 2019-10-06 ENCOUNTER — Ambulatory Visit: Payer: Medicare Other | Admitting: Family Medicine

## 2019-10-13 ENCOUNTER — Ambulatory Visit: Payer: Medicare Other | Admitting: Family Medicine

## 2019-10-13 MED FILL — PEG-3350 SOLUTION: 420 | 1 days supply | Qty: 4000 | Fill #0

## 2019-10-18 ENCOUNTER — Emergency Department (HOSPITAL_COMMUNITY)
Admission: EM | Admit: 2019-10-18 | Discharge: 2019-10-18 | Disposition: A | Discharge status: Home / Self Care | Payer: Medicare Other | Attending: Emergency Medicine | Admitting: Emergency Medicine

## 2019-10-18 ENCOUNTER — Emergency Department (HOSPITAL_COMMUNITY): Payer: Medicare Other

## 2019-10-18 ENCOUNTER — Encounter (HOSPITAL_COMMUNITY): Payer: Self-pay | Admitting: Emergency Medicine

## 2019-10-18 ENCOUNTER — Other Ambulatory Visit: Payer: Self-pay

## 2019-10-18 DIAGNOSIS — R030 Elevated blood-pressure reading, without diagnosis of hypertension: Secondary | ICD-10-CM | POA: Diagnosis not present

## 2019-10-18 DIAGNOSIS — F17210 Nicotine dependence, cigarettes, uncomplicated: Secondary | ICD-10-CM | POA: Diagnosis not present

## 2019-10-18 DIAGNOSIS — N289 Disorder of kidney and ureter, unspecified: Secondary | ICD-10-CM | POA: Diagnosis not present

## 2019-10-18 DIAGNOSIS — R112 Nausea with vomiting, unspecified: Secondary | ICD-10-CM | POA: Diagnosis not present

## 2019-10-18 DIAGNOSIS — T402X5A Adverse effect of other opioids, initial encounter: Secondary | ICD-10-CM | POA: Diagnosis not present

## 2019-10-18 DIAGNOSIS — K5903 Drug induced constipation: Secondary | ICD-10-CM

## 2019-10-18 DIAGNOSIS — Z79899 Other long term (current) drug therapy: Secondary | ICD-10-CM | POA: Diagnosis not present

## 2019-10-18 DIAGNOSIS — R109 Unspecified abdominal pain: Secondary | ICD-10-CM | POA: Diagnosis present

## 2019-10-18 DIAGNOSIS — G8929 Other chronic pain: Secondary | ICD-10-CM | POA: Diagnosis not present

## 2019-10-18 LAB — CBC
HCT: 46.1 % (ref 39.0–52.0)
Hemoglobin: 16.3 g/dL (ref 13.0–17.0)
MCH: 32.4 pg (ref 26.0–34.0)
MCHC: 35.4 g/dL (ref 30.0–36.0)
MCV: 91.7 fL (ref 80.0–100.0)
Platelets: 271 10*3/uL (ref 150–400)
RBC: 5.03 MIL/uL (ref 4.22–5.81)
RDW: 13.5 % (ref 11.5–15.5)
WBC: 8.8 10*3/uL (ref 4.0–10.5)
nRBC: 0 % (ref 0.0–0.2)

## 2019-10-18 LAB — URINALYSIS, ROUTINE W REFLEX MICROSCOPIC
Bilirubin Urine: NEGATIVE
Glucose, UA: NEGATIVE mg/dL
Ketones, ur: 5 mg/dL — AB
Leukocytes,Ua: NEGATIVE
Nitrite: NEGATIVE
Protein, ur: 100 mg/dL — AB
Specific Gravity, Urine: 1.03 (ref 1.005–1.030)
pH: 5 (ref 5.0–8.0)

## 2019-10-18 LAB — COMPREHENSIVE METABOLIC PANEL
ALT: 30 U/L (ref 0–44)
AST: 29 U/L (ref 15–41)
Albumin: 4.5 g/dL (ref 3.5–5.0)
Alkaline Phosphatase: 73 U/L (ref 38–126)
Anion gap: 14 (ref 5–15)
BUN: 10 mg/dL (ref 6–20)
CO2: 19 mmol/L — ABNORMAL LOW (ref 22–32)
Calcium: 10 mg/dL (ref 8.9–10.3)
Chloride: 101 mmol/L (ref 98–111)
Creatinine, Ser: 1.5 mg/dL — ABNORMAL HIGH (ref 0.61–1.24)
GFR calc Af Amer: 60 mL/min — ABNORMAL LOW (ref >60)
GFR calc non Af Amer: 52 mL/min — ABNORMAL LOW (ref >60)
Glucose, Bld: 127 mg/dL — ABNORMAL HIGH (ref 70–99)
Potassium: 4.1 mmol/L (ref 3.5–5.1)
Sodium: 134 mmol/L — ABNORMAL LOW (ref 135–145)
Total Bilirubin: 0.8 mg/dL (ref 0.3–1.2)
Total Protein: 8.5 g/dL — ABNORMAL HIGH (ref 6.5–8.1)

## 2019-10-18 LAB — LIPASE, BLOOD: Lipase: 41 U/L (ref 11–51)

## 2019-10-18 MED ORDER — SODIUM CHLORIDE 0.9 % IV BOLUS
1000.0000 mL | Freq: Once | INTRAVENOUS | Status: AC
  Administered 2019-10-18: 1000 mL via INTRAVENOUS

## 2019-10-18 MED ORDER — SODIUM CHLORIDE 0.9% FLUSH: 3.0000 mL | Freq: Once | INTRAVENOUS | Status: DC

## 2019-10-18 MED ORDER — ONDANSETRON HCL 4 MG/2ML IJ SOLN
4.0000 mg | Freq: Once | INTRAMUSCULAR | Status: AC
  Administered 2019-10-18: 06:00:00 4 mg via INTRAVENOUS
  Filled 2019-10-18: qty 2

## 2019-10-18 MED ORDER — ONDANSETRON HCL 4 MG PO TABS: 4.0000 mg | ORAL_TABLET | Freq: Four times a day (QID) | ORAL | 0 refills | Status: DC | PRN

## 2019-10-18 MED ORDER — FENTANYL CITRATE (PF) 100 MCG/2ML IJ SOLN
50.0000 ug | Freq: Once | INTRAMUSCULAR | Status: AC
  Administered 2019-10-18: 06:00:00 50 ug via INTRAVENOUS
  Filled 2019-10-18: qty 2

## 2019-10-18 MED ORDER — LUBIPROSTONE 24 MCG PO CAPS: 24.0000 ug | ORAL_CAPSULE | Freq: Two times a day (BID) | ORAL | 0 refills | Status: DC

## 2019-10-18 NOTE — Discharge Instructions (Addendum)
Try to drink plenty of fluids.  You have been given a 2-week supply of a medication that should help constipation from chronic opioid use. If it is helpful, your primary care provider can prescribe more of it. In the meantime, if your symptoms are getting worse, return to the emergency department for reevaluation.

## 2019-10-18 NOTE — ED Triage Notes (Signed)
Patient here with chronic abdominal pain and constipation.  Patient has not had a BM since last week.  No vomiting per EMS.
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
## 2019-10-18 NOTE — ED Notes (Signed)
Patient verbalizes understanding of discharge instructions . Opportunity for questions and answers were provided . Armband removed by staff ,Pt discharged from ED. W/C  offered at D/C  and Declined W/C at D/C and was escorted to lobby by RN.  

## 2019-10-20 MED FILL — AMITIZA 24 MCG CAPSULES: 24 | 15 days supply | Qty: 30 | Fill #0

## 2019-10-20 MED FILL — ONDANSETRON HCL 4 MG TABLET: 4 | 5 days supply | Qty: 20 | Fill #0

## 2019-10-27 MED FILL — traMADol HCL 50 MG TABS: 50 | 15 days supply | Qty: 60 | Fill #1

## 2019-11-21 MED FILL — traMADol HCL 50 MG TABS: 50 | 15 days supply | Qty: 60 | Fill #2
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
## 2020-02-16 ENCOUNTER — Other Ambulatory Visit: Payer: Self-pay

## 2020-02-16 ENCOUNTER — Encounter (HOSPITAL_COMMUNITY): Payer: Self-pay | Admitting: Emergency Medicine

## 2020-02-16 ENCOUNTER — Emergency Department (HOSPITAL_COMMUNITY)
Admission: EM | Admit: 2020-02-16 | Discharge: 2020-02-16 | Disposition: A | Discharge status: Home / Self Care | Payer: Medicare Other | Attending: Emergency Medicine | Admitting: Emergency Medicine

## 2020-02-16 DIAGNOSIS — F419 Anxiety disorder, unspecified: Secondary | ICD-10-CM | POA: Diagnosis not present

## 2020-02-16 DIAGNOSIS — R112 Nausea with vomiting, unspecified: Secondary | ICD-10-CM | POA: Insufficient documentation

## 2020-02-16 DIAGNOSIS — F17210 Nicotine dependence, cigarettes, uncomplicated: Secondary | ICD-10-CM | POA: Insufficient documentation

## 2020-02-16 DIAGNOSIS — F1290 Cannabis use, unspecified, uncomplicated: Secondary | ICD-10-CM | POA: Diagnosis not present

## 2020-02-16 DIAGNOSIS — R1010 Upper abdominal pain, unspecified: Secondary | ICD-10-CM

## 2020-02-16 DIAGNOSIS — Z79899 Other long term (current) drug therapy: Secondary | ICD-10-CM | POA: Insufficient documentation

## 2020-02-16 DIAGNOSIS — R1013 Epigastric pain: Secondary | ICD-10-CM | POA: Diagnosis not present

## 2020-02-16 LAB — RAPID URINE DRUG SCREEN, HOSP PERFORMED
Amphetamines: NOT DETECTED
Barbiturates: NOT DETECTED
Benzodiazepines: NOT DETECTED
Cocaine: NOT DETECTED
Opiates: NOT DETECTED
Tetrahydrocannabinol: POSITIVE — AB

## 2020-02-16 LAB — CBC
HCT: 45.2 % (ref 39.0–52.0)
Hemoglobin: 15.1 g/dL (ref 13.0–17.0)
MCH: 32.4 pg (ref 26.0–34.0)
MCHC: 33.4 g/dL (ref 30.0–36.0)
MCV: 97 fL (ref 80.0–100.0)
Platelets: 214 10*3/uL (ref 150–400)
RBC: 4.66 MIL/uL (ref 4.22–5.81)
RDW: 13.3 % (ref 11.5–15.5)
WBC: 7.6 10*3/uL (ref 4.0–10.5)
nRBC: 0 % (ref 0.0–0.2)

## 2020-02-16 LAB — COMPREHENSIVE METABOLIC PANEL
ALT: 22 U/L (ref 0–44)
AST: 25 U/L (ref 15–41)
Albumin: 4.6 g/dL (ref 3.5–5.0)
Alkaline Phosphatase: 57 U/L (ref 38–126)
Anion gap: 10 (ref 5–15)
BUN: 13 mg/dL (ref 6–20)
CO2: 28 mmol/L (ref 22–32)
Calcium: 9.9 mg/dL (ref 8.9–10.3)
Chloride: 100 mmol/L (ref 98–111)
Creatinine, Ser: 1.03 mg/dL (ref 0.61–1.24)
GFR calc Af Amer: >60 mL/min (ref >60)
GFR calc non Af Amer: >60 mL/min (ref >60)
Glucose, Bld: 98 mg/dL (ref 70–99)
Potassium: 4.1 mmol/L (ref 3.5–5.1)
Sodium: 138 mmol/L (ref 135–145)
Total Bilirubin: 0.6 mg/dL (ref 0.3–1.2)
Total Protein: 7.9 g/dL (ref 6.5–8.1)

## 2020-02-16 LAB — URINALYSIS, ROUTINE W REFLEX MICROSCOPIC
Bilirubin Urine: NEGATIVE
Glucose, UA: NEGATIVE mg/dL
Hgb urine dipstick: NEGATIVE
Ketones, ur: 5 mg/dL — AB
Leukocytes,Ua: NEGATIVE
Nitrite: NEGATIVE
Protein, ur: NEGATIVE mg/dL
Specific Gravity, Urine: 1.03 (ref 1.005–1.030)
pH: 5 (ref 5.0–8.0)

## 2020-02-16 LAB — LIPASE, BLOOD: Lipase: 28 U/L (ref 11–51)

## 2020-02-16 MED ORDER — HYDROMORPHONE HCL 1 MG/ML IJ SOLN
0.5000 mg | Freq: Once | INTRAMUSCULAR | Status: AC
  Administered 2020-02-16: 0.5 mg via INTRAVENOUS
  Filled 2020-02-16: qty 1

## 2020-02-16 MED ORDER — SODIUM CHLORIDE 0.9 % IV BOLUS
1000.0000 mL | Freq: Once | INTRAVENOUS | Status: AC
  Administered 2020-02-16: 1000 mL via INTRAVENOUS

## 2020-02-16 MED ORDER — LORAZEPAM 2 MG/ML IJ SOLN
1.0000 mg | Freq: Once | INTRAMUSCULAR | Status: AC
  Administered 2020-02-16: 1 mg via INTRAVENOUS
  Filled 2020-02-16: qty 1

## 2020-02-16 MED ORDER — FAMOTIDINE IN NACL 20-0.9 MG/50ML-% IV SOLN
20.0000 mg | Freq: Once | INTRAVENOUS | Status: AC
  Administered 2020-02-16: 20 mg via INTRAVENOUS
  Filled 2020-02-16: qty 50

## 2020-02-16 NOTE — Discharge Instructions (Addendum)
It was our pleasure to provide your ER care today - we hope that you feel better.  Note that increasingly we are seeing marijuana as a cause of a recurrent abdominal pain and vomiting syndrome called Cannabinoid Hyperemesis Syndrome.  In these cases, avoiding marijuana use prevents these symptoms from recurring.   Follow up with primary care doctor in 1-2 weeks.  Return to ER if worse, new symptoms, fevers, worsening or severe pain, persistent vomiting, or other concern.  You were given pain medication in the ER - no driving for the next 6 hours.
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
## 2020-02-16 NOTE — ED Notes (Addendum)
Pt given juice, water, sandwich, PB, and crackers at this time. Pt able to eat and drink without difficulty.
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
## 2020-03-01 ENCOUNTER — Emergency Department (HOSPITAL_COMMUNITY)
Admission: EM | Admit: 2020-03-01 | Discharge: 2020-03-01 | Disposition: A | Discharge status: Home / Self Care | Payer: Medicare Other | Attending: Emergency Medicine | Admitting: Emergency Medicine

## 2020-03-01 ENCOUNTER — Encounter (HOSPITAL_COMMUNITY): Payer: Self-pay | Admitting: Emergency Medicine

## 2020-03-01 DIAGNOSIS — G8929 Other chronic pain: Secondary | ICD-10-CM | POA: Insufficient documentation

## 2020-03-01 DIAGNOSIS — Z5321 Procedure and treatment not carried out due to patient leaving prior to being seen by health care provider: Secondary | ICD-10-CM | POA: Diagnosis not present

## 2020-03-01 DIAGNOSIS — R1032 Left lower quadrant pain: Secondary | ICD-10-CM | POA: Diagnosis not present
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
## 2020-05-27 ENCOUNTER — Other Ambulatory Visit: Payer: Self-pay

## 2020-05-27 ENCOUNTER — Encounter (HOSPITAL_COMMUNITY): Payer: Self-pay

## 2020-05-27 ENCOUNTER — Emergency Department (HOSPITAL_COMMUNITY)
Admission: EM | Admit: 2020-05-27 | Discharge: 2020-05-27 | Disposition: A | Discharge status: Home / Self Care | Payer: Medicare Other | Attending: Emergency Medicine | Admitting: Emergency Medicine

## 2020-05-27 DIAGNOSIS — R1084 Generalized abdominal pain: Secondary | ICD-10-CM | POA: Diagnosis not present

## 2020-05-27 DIAGNOSIS — G8929 Other chronic pain: Secondary | ICD-10-CM | POA: Diagnosis not present

## 2020-05-27 DIAGNOSIS — R109 Unspecified abdominal pain: Secondary | ICD-10-CM

## 2020-05-27 DIAGNOSIS — F1290 Cannabis use, unspecified, uncomplicated: Secondary | ICD-10-CM | POA: Diagnosis not present

## 2020-05-27 DIAGNOSIS — F17210 Nicotine dependence, cigarettes, uncomplicated: Secondary | ICD-10-CM | POA: Insufficient documentation

## 2020-05-27 LAB — URINALYSIS, ROUTINE W REFLEX MICROSCOPIC
Bilirubin Urine: NEGATIVE
Glucose, UA: NEGATIVE mg/dL
Hgb urine dipstick: NEGATIVE
Ketones, ur: NEGATIVE mg/dL
Leukocytes,Ua: NEGATIVE
Nitrite: NEGATIVE
Protein, ur: NEGATIVE mg/dL
Specific Gravity, Urine: 1.028 (ref 1.005–1.030)
pH: 5 (ref 5.0–8.0)

## 2020-05-27 MED ORDER — DICYCLOMINE HCL 20 MG PO TABS: 20.0000 mg | ORAL_TABLET | Freq: Two times a day (BID) | ORAL | 0 refills | Status: DC | PRN

## 2020-05-27 MED ORDER — DICYCLOMINE HCL 10 MG PO CAPS: 10.0000 mg | ORAL_CAPSULE | Freq: Once | ORAL | Status: DC

## 2020-05-27 NOTE — ED Triage Notes (Signed)
Pt is coming from home, complains of lower back pain for one week, also complains of diarrhea and vomiting, pt states that his urine is very dark

## 2020-05-27 NOTE — ED Notes (Signed)
Pt eloped from treatment room prior to medication administration and discharge papers.
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
## 2020-05-27 NOTE — ED Notes (Signed)
Pt refusing to lay in bed. Pt jumping around and sitting on head.
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
## 2020-10-21 ENCOUNTER — Encounter (HOSPITAL_COMMUNITY): Payer: Self-pay | Admitting: Emergency Medicine

## 2020-10-21 ENCOUNTER — Other Ambulatory Visit: Payer: Self-pay

## 2020-10-21 ENCOUNTER — Emergency Department (HOSPITAL_COMMUNITY)
Admission: EM | Admit: 2020-10-21 | Discharge: 2020-10-21 | Disposition: A | Discharge status: Home / Self Care | Payer: Medicare Other | Attending: Emergency Medicine | Admitting: Emergency Medicine

## 2020-10-21 DIAGNOSIS — G8929 Other chronic pain: Secondary | ICD-10-CM

## 2020-10-21 DIAGNOSIS — F17210 Nicotine dependence, cigarettes, uncomplicated: Secondary | ICD-10-CM | POA: Diagnosis not present

## 2020-10-21 DIAGNOSIS — N200 Calculus of kidney: Secondary | ICD-10-CM | POA: Diagnosis not present

## 2020-10-21 DIAGNOSIS — M25512 Pain in left shoulder: Secondary | ICD-10-CM | POA: Diagnosis not present

## 2020-10-21 DIAGNOSIS — K2100 Gastro-esophageal reflux disease with esophagitis, without bleeding: Secondary | ICD-10-CM | POA: Diagnosis not present

## 2020-10-21 DIAGNOSIS — R1013 Epigastric pain: Secondary | ICD-10-CM | POA: Diagnosis not present

## 2020-10-21 LAB — COMPREHENSIVE METABOLIC PANEL
ALT: 28 U/L (ref 0–44)
AST: 28 U/L (ref 15–41)
Albumin: 4.1 g/dL (ref 3.5–5.0)
Alkaline Phosphatase: 44 U/L (ref 38–126)
Anion gap: 11 (ref 5–15)
BUN: 11 mg/dL (ref 6–20)
CO2: 22 mmol/L (ref 22–32)
Calcium: 9.6 mg/dL (ref 8.9–10.3)
Chloride: 104 mmol/L (ref 98–111)
Creatinine, Ser: 1.08 mg/dL (ref 0.61–1.24)
GFR, Estimated: >60 mL/min (ref >60)
Glucose, Bld: 95 mg/dL (ref 70–99)
Potassium: 3.8 mmol/L (ref 3.5–5.1)
Sodium: 137 mmol/L (ref 135–145)
Total Bilirubin: 0.9 mg/dL (ref 0.3–1.2)
Total Protein: 7.1 g/dL (ref 6.5–8.1)

## 2020-10-21 LAB — CBC
HCT: 43 % (ref 39.0–52.0)
Hemoglobin: 14.4 g/dL (ref 13.0–17.0)
MCH: 32.4 pg (ref 26.0–34.0)
MCHC: 33.5 g/dL (ref 30.0–36.0)
MCV: 96.8 fL (ref 80.0–100.0)
Platelets: 240 10*3/uL (ref 150–400)
RBC: 4.44 MIL/uL (ref 4.22–5.81)
RDW: 13.3 % (ref 11.5–15.5)
WBC: 8.6 10*3/uL (ref 4.0–10.5)
nRBC: 0 % (ref 0.0–0.2)

## 2020-10-21 LAB — URINALYSIS, ROUTINE W REFLEX MICROSCOPIC
Bilirubin Urine: NEGATIVE
Glucose, UA: NEGATIVE mg/dL
Ketones, ur: NEGATIVE mg/dL
Leukocytes,Ua: NEGATIVE
Nitrite: NEGATIVE
Protein, ur: 30 mg/dL — AB
Specific Gravity, Urine: 1.028 (ref 1.005–1.030)
pH: 5 (ref 5.0–8.0)

## 2020-10-21 LAB — LIPASE, BLOOD: Lipase: 57 U/L — ABNORMAL HIGH (ref 11–51)

## 2020-10-21 MED ORDER — DICYCLOMINE HCL 10 MG PO CAPS
10.0000 mg | ORAL_CAPSULE | Freq: Once | ORAL | Status: AC
  Administered 2020-10-21: 10 mg via ORAL
  Filled 2020-10-21: qty 1
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
## 2020-10-21 NOTE — ED Notes (Addendum)
Patient assisted to bathroom and has not returned to the room. Charge nurse notified.

## 2020-10-21 NOTE — ED Notes (Addendum)
Waited to see if patient would return to room patient did not return Charge notified. MD aware.
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
## 2020-11-16 ENCOUNTER — Other Ambulatory Visit: Payer: Self-pay | Admitting: Internal Medicine

## 2020-11-16 MED FILL — DICYCLOMINE 20 MG TABLET: 20 | 30 days supply | Qty: 90 | Fill #0

## 2020-11-23 MED FILL — FAMOTIDINE 20 MG TABS: 20 | 30 days supply | Qty: 30 | Fill #0

## 2020-12-10 ENCOUNTER — Other Ambulatory Visit: Payer: Self-pay | Admitting: Family Medicine

## 2020-12-10 MED FILL — FAMOTIDINE 20 MG TABS: 20 | 30 days supply | Qty: 30 | Fill #0

## 2020-12-10 MED FILL — VIT D2 1.25 MG (50,000 UNIT: 1.25 MG | 28 days supply | Qty: 4 | Fill #0

## 2020-12-21 ENCOUNTER — Other Ambulatory Visit: Payer: Self-pay | Admitting: Gastroenterology

## 2020-12-21 MED FILL — SUPREP BOWEL PREP KIT: 17.5-3.13-1 | 1 days supply | Qty: 354 | Fill #0

## 2020-12-21 MED FILL — FAMOTIDINE 20 MG TABS: 20 | 30 days supply | Qty: 30 | Fill #0

## 2020-12-31 ENCOUNTER — Telehealth: Payer: Self-pay | Admitting: Family Medicine

## 2020-12-31 NOTE — Telephone Encounter (Signed)
Tried calling patient to  schedule Medicare Annual Wellness Visit (AWV) either virtually or in office.  No answer  Last AWV no information  please schedule at anytime with [facility] Nurse Health Advisor 1 or 2   This should be a 45 minute visit. Patient needs follow up appointment with PCP last ov  07/04/2019

## 2021-01-05 ENCOUNTER — Telehealth: Payer: Self-pay

## 2021-01-05 NOTE — Telephone Encounter (Signed)
NOTES ON FILE [facility] [phone], SENT REFERRAL TO SCHEDULING 

## 2021-01-17 MED FILL — FAMOTIDINE 20 MG TABS: 20 | 30 days supply | Qty: 30 | Fill #1

## 2021-02-01 MED FILL — FAMOTIDINE 20 MG TABS: 20 | 30 days supply | Qty: 30 | Fill #1
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
## 2021-02-10 ENCOUNTER — Encounter: Payer: Self-pay | Admitting: Cardiology

## 2021-02-10 ENCOUNTER — Other Ambulatory Visit: Payer: Self-pay

## 2021-02-10 ENCOUNTER — Ambulatory Visit (INDEPENDENT_AMBULATORY_CARE_PROVIDER_SITE_OTHER): Payer: 59 | Admitting: Cardiology

## 2021-02-10 VITALS — BP 124/66 | HR 60 | Ht 70.0 in | Wt 141.8 lb

## 2021-02-10 DIAGNOSIS — R1084 Generalized abdominal pain: Secondary | ICD-10-CM

## 2021-02-10 DIAGNOSIS — R9431 Abnormal electrocardiogram [ECG] [EKG]: Secondary | ICD-10-CM | POA: Diagnosis not present

## 2021-02-10 DIAGNOSIS — R079 Chest pain, unspecified: Secondary | ICD-10-CM

## 2021-02-10 NOTE — Patient Instructions (Addendum)
Medication Instructions:  Your physician recommends that you continue on your current medications as directed. Please refer to the Current Medication list given to you today. *If you need a refill on your cardiac medications before your next appointment, please call your pharmacy*    Testing/Procedures: Your physician has requested that you have an echocardiogram. Echocardiography is a painless test that uses sound waves to create images of your heart. It provides your doctor with information about the size and shape of your heart and how well your heart's chambers and valves are working. This procedure takes approximately one hour. There are no restrictions for this procedure.    Follow-Up: At [facility], you and your health needs are our priority.  As part of our continuing mission to provide you with exceptional heart care, we have created designated Provider Care Teams.  These Care Teams include your primary Cardiologist (physician) and Advanced Practice Providers (APPs -  Physician Assistants and Nurse Practitioners) who all work together to provide you with the care you need, when you need it.  We recommend signing up for the patient portal called "MyChart".  Sign up information is provided on this After Visit Summary.  MyChart is used to connect with patients for Virtual Visits (Telemedicine).  Patients are able to view lab/test results, encounter notes, upcoming appointments, etc.  Non-urgent messages can be sent to your provider as well.   To learn more about what you can do with MyChart, go to [email or web address].    Your next appointment:   As Needed  The format for your next appointment:   In Person  Provider:   You may see Dr. Gwyndolyn Kaufman or one of the following Advanced Practice Providers on your designated Care Team:    Richardson Dopp, PA-C  [place], [place]

## 2021-02-15 ENCOUNTER — Telehealth: Payer: Self-pay | Admitting: Family Medicine

## 2021-02-15 NOTE — Telephone Encounter (Signed)
Tried calling patient to  schedule Medicare Annual Wellness Visit (AWV) either virtually or in office.  Phone kept disconneting   AWVI please schedule at anytime with [facility] Nurse Health Advisor 1 or 2   This should be a 45 minute visit.  Patient also needs appointment with PCP last appointment 07/04/2019

## 2021-03-04 ENCOUNTER — Emergency Department (HOSPITAL_COMMUNITY)
Admission: EM | Admit: 2021-03-04 | Discharge: 2021-03-04 | Discharge status: Against Medical Advice | Payer: 59 | Attending: Emergency Medicine | Admitting: Emergency Medicine

## 2021-03-04 ENCOUNTER — Encounter (HOSPITAL_COMMUNITY): Payer: Self-pay

## 2021-03-04 ENCOUNTER — Emergency Department (HOSPITAL_COMMUNITY): Payer: 59

## 2021-03-04 DIAGNOSIS — R1031 Right lower quadrant pain: Secondary | ICD-10-CM | POA: Diagnosis present

## 2021-03-04 DIAGNOSIS — R319 Hematuria, unspecified: Secondary | ICD-10-CM | POA: Diagnosis not present

## 2021-03-04 DIAGNOSIS — R109 Unspecified abdominal pain: Secondary | ICD-10-CM

## 2021-03-04 DIAGNOSIS — F17210 Nicotine dependence, cigarettes, uncomplicated: Secondary | ICD-10-CM | POA: Insufficient documentation

## 2021-03-04 LAB — URINALYSIS, ROUTINE W REFLEX MICROSCOPIC
Bacteria, UA: NONE SEEN
Bilirubin Urine: NEGATIVE
Glucose, UA: NEGATIVE mg/dL
Ketones, ur: 5 mg/dL — AB
Leukocytes,Ua: NEGATIVE
Nitrite: NEGATIVE
Protein, ur: NEGATIVE mg/dL
Specific Gravity, Urine: 1.029 (ref 1.005–1.030)
pH: 5 (ref 5.0–8.0)

## 2021-03-04 LAB — CBC WITH DIFFERENTIAL/PLATELET
Abs Immature Granulocytes: 0.05 10*3/uL (ref 0.00–0.07)
Basophils Absolute: 0 10*3/uL (ref 0.0–0.1)
Basophils Relative: 0 %
Eosinophils Absolute: 0 10*3/uL (ref 0.0–0.5)
Eosinophils Relative: 0 %
HCT: 42.5 % (ref 39.0–52.0)
Hemoglobin: 14.4 g/dL (ref 13.0–17.0)
Immature Granulocytes: 1 %
Lymphocytes Relative: 25 %
Lymphs Abs: 2.6 10*3/uL (ref 0.7–4.0)
MCH: 32.1 pg (ref 26.0–34.0)
MCHC: 33.9 g/dL (ref 30.0–36.0)
MCV: 94.9 fL (ref 80.0–100.0)
Monocytes Absolute: 0.7 10*3/uL (ref 0.1–1.0)
Monocytes Relative: 7 %
Neutro Abs: 6.9 10*3/uL (ref 1.7–7.7)
Neutrophils Relative %: 67 %
Platelets: 230 10*3/uL (ref 150–400)
RBC: 4.48 MIL/uL (ref 4.22–5.81)
RDW: 13.1 % (ref 11.5–15.5)
WBC: 10.3 10*3/uL (ref 4.0–10.5)
nRBC: 0 % (ref 0.0–0.2)

## 2021-03-04 LAB — COMPREHENSIVE METABOLIC PANEL
ALT: 25 U/L (ref 0–44)
AST: 25 U/L (ref 15–41)
Albumin: 4.3 g/dL (ref 3.5–5.0)
Alkaline Phosphatase: 64 U/L (ref 38–126)
Anion gap: 7 (ref 5–15)
BUN: 10 mg/dL (ref 6–20)
CO2: 26 mmol/L (ref 22–32)
Calcium: 9.6 mg/dL (ref 8.9–10.3)
Chloride: 104 mmol/L (ref 98–111)
Creatinine, Ser: 1.16 mg/dL (ref 0.61–1.24)
GFR, Estimated: >60 mL/min (ref >60)
Glucose, Bld: 107 mg/dL — ABNORMAL HIGH (ref 70–99)
Potassium: 4.5 mmol/L (ref 3.5–5.1)
Sodium: 137 mmol/L (ref 135–145)
Total Bilirubin: 0.5 mg/dL (ref 0.3–1.2)
Total Protein: 8.2 g/dL — ABNORMAL HIGH (ref 6.5–8.1)

## 2021-03-04 LAB — LIPASE, BLOOD: Lipase: 37 U/L (ref 11–51)

## 2021-03-04 MED ORDER — IOHEXOL 300 MG/ML  SOLN
100.0000 mL | Freq: Once | INTRAMUSCULAR | Status: AC | PRN
  Administered 2021-03-04: 100 mL via INTRAVENOUS

## 2021-03-04 MED ORDER — FENTANYL CITRATE (PF) 100 MCG/2ML IJ SOLN
50.0000 ug | Freq: Once | INTRAMUSCULAR | Status: AC
  Administered 2021-03-04: 50 ug via INTRAVENOUS
  Filled 2021-03-04: qty 2

## 2021-03-04 MED ORDER — SODIUM CHLORIDE 0.9 % IV BOLUS
1000.0000 mL | Freq: Once | INTRAVENOUS | Status: AC
  Administered 2021-03-04: 1000 mL via INTRAVENOUS

## 2021-03-04 MED ORDER — DICYCLOMINE HCL 10 MG/ML IM SOLN
20.0000 mg | Freq: Once | INTRAMUSCULAR | Status: AC
  Administered 2021-03-04: 20 mg via INTRAMUSCULAR
  Filled 2021-03-04: qty 2

## 2021-03-04 NOTE — ED Triage Notes (Signed)
Pt states he has been having pain and dark urine since procedure 1 month ago. Urine appeared 'a little brighter' today.

## 2021-03-04 NOTE — ED Notes (Signed)
Patient not in room, not able to locate.

## 2021-03-04 NOTE — ED Triage Notes (Signed)
Pt BIB [facility] from home for hematuria and 'kidney pain' radiating to lower back. Endorses nausea and vomiting. No other complaints or urinary sx. Abdomen diffusely  tender to palpation. Rates pain 19/10. Denies blood thinners.  BP 130/84 HR 97 RR 16 SpO2 99% RA Temp 97.6

## 2021-03-04 NOTE — ED Notes (Signed)
Patient off floor for scan 

## 2021-03-04 NOTE — ED Notes (Signed)
Bladder scan performed on patient  Results : 0 ml scanned in bladder
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
## 2021-03-05 LAB — URINE CULTURE: Culture: NO GROWTH

## 2021-03-08 ENCOUNTER — Other Ambulatory Visit: Payer: Self-pay

## 2021-03-08 ENCOUNTER — Ambulatory Visit (HOSPITAL_COMMUNITY): Payer: 59 | Attending: Cardiology

## 2021-03-08 DIAGNOSIS — R9431 Abnormal electrocardiogram [ECG] [EKG]: Secondary | ICD-10-CM | POA: Insufficient documentation

## 2021-03-08 LAB — ECHOCARDIOGRAM COMPLETE
Area-P 1/2: 3 cm2
S' Lateral: 2.7 cm

## 2021-03-18 ENCOUNTER — Emergency Department (HOSPITAL_COMMUNITY)
Admission: EM | Admit: 2021-03-18 | Discharge: 2021-03-18 | Disposition: A | Discharge status: Home / Self Care | Payer: 59 | Attending: Emergency Medicine | Admitting: Emergency Medicine

## 2021-03-18 ENCOUNTER — Other Ambulatory Visit: Payer: Self-pay

## 2021-03-18 DIAGNOSIS — G8929 Other chronic pain: Secondary | ICD-10-CM | POA: Insufficient documentation

## 2021-03-18 DIAGNOSIS — R1084 Generalized abdominal pain: Secondary | ICD-10-CM | POA: Insufficient documentation

## 2021-03-18 DIAGNOSIS — R109 Unspecified abdominal pain: Secondary | ICD-10-CM | POA: Diagnosis present

## 2021-03-18 DIAGNOSIS — F17210 Nicotine dependence, cigarettes, uncomplicated: Secondary | ICD-10-CM | POA: Insufficient documentation

## 2021-03-18 DIAGNOSIS — R319 Hematuria, unspecified: Secondary | ICD-10-CM | POA: Insufficient documentation

## 2021-03-18 DIAGNOSIS — K219 Gastro-esophageal reflux disease without esophagitis: Secondary | ICD-10-CM | POA: Insufficient documentation

## 2021-03-18 LAB — CBC WITH DIFFERENTIAL/PLATELET
Abs Immature Granulocytes: 0.03 10*3/uL (ref 0.00–0.07)
Basophils Absolute: 0.1 10*3/uL (ref 0.0–0.1)
Basophils Relative: 1 %
Eosinophils Absolute: 0.2 10*3/uL (ref 0.0–0.5)
Eosinophils Relative: 2 %
HCT: 40.6 % (ref 39.0–52.0)
Hemoglobin: 13.8 g/dL (ref 13.0–17.0)
Immature Granulocytes: 0 %
Lymphocytes Relative: 38 %
Lymphs Abs: 2.7 10*3/uL (ref 0.7–4.0)
MCH: 32.5 pg (ref 26.0–34.0)
MCHC: 34 g/dL (ref 30.0–36.0)
MCV: 95.5 fL (ref 80.0–100.0)
Monocytes Absolute: 0.7 10*3/uL (ref 0.1–1.0)
Monocytes Relative: 10 %
Neutro Abs: 3.5 10*3/uL (ref 1.7–7.7)
Neutrophils Relative %: 49 %
Platelets: 216 10*3/uL (ref 150–400)
RBC: 4.25 MIL/uL (ref 4.22–5.81)
RDW: 13.5 % (ref 11.5–15.5)
WBC: 7.2 10*3/uL (ref 4.0–10.5)
nRBC: 0 % (ref 0.0–0.2)

## 2021-03-18 LAB — URINALYSIS, ROUTINE W REFLEX MICROSCOPIC
Bilirubin Urine: NEGATIVE
Glucose, UA: NEGATIVE mg/dL
Hgb urine dipstick: NEGATIVE
Ketones, ur: NEGATIVE mg/dL
Leukocytes,Ua: NEGATIVE
Nitrite: NEGATIVE
Protein, ur: NEGATIVE mg/dL
Specific Gravity, Urine: 1.024 (ref 1.005–1.030)
pH: 5 (ref 5.0–8.0)

## 2021-03-18 LAB — COMPREHENSIVE METABOLIC PANEL
ALT: 23 U/L (ref 0–44)
AST: 24 U/L (ref 15–41)
Albumin: 4.3 g/dL (ref 3.5–5.0)
Alkaline Phosphatase: 63 U/L (ref 38–126)
Anion gap: 5 (ref 5–15)
BUN: 11 mg/dL (ref 6–20)
CO2: 25 mmol/L (ref 22–32)
Calcium: 9.6 mg/dL (ref 8.9–10.3)
Chloride: 110 mmol/L (ref 98–111)
Creatinine, Ser: 1.18 mg/dL (ref 0.61–1.24)
GFR, Estimated: >60 mL/min (ref >60)
Glucose, Bld: 100 mg/dL — ABNORMAL HIGH (ref 70–99)
Potassium: 4.2 mmol/L (ref 3.5–5.1)
Sodium: 140 mmol/L (ref 135–145)
Total Bilirubin: 0.8 mg/dL (ref 0.3–1.2)
Total Protein: 7.9 g/dL (ref 6.5–8.1)

## 2021-03-18 LAB — LIPASE, BLOOD: Lipase: 37 U/L (ref 11–51)

## 2021-03-18 MED ORDER — KETOROLAC TROMETHAMINE 60 MG/2ML IM SOLN
30.0000 mg | Freq: Once | INTRAMUSCULAR | Status: AC
  Administered 2021-03-18: 30 mg via INTRAMUSCULAR
  Filled 2021-03-18: qty 2
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
## 2021-03-18 NOTE — ED Triage Notes (Signed)
Pt came from home via EMS. C/c: abdominal pain. hematuria past couple days. Right lower back pain radiating to abdomen. PMH of kidney stones and hernia 118/80 70 100% 106 CBG A&O x4

## 2021-03-18 NOTE — ED Notes (Signed)
Pt called out x2 asking to see a doctor. This RN informed patient that doctors are aware he is seeking care and will see him as soon as they can. Pt states "Goddamn this shit is ridiculous"

## 2021-03-18 NOTE — Discharge Instructions (Addendum)
Follow-up with your doctor. Your labs today are reassuring, no concerning findings, no blood in the urine.

## 2021-04-11 ENCOUNTER — Emergency Department (HOSPITAL_COMMUNITY)
Admission: EM | Admit: 2021-04-11 | Discharge: 2021-04-11 | Disposition: A | Discharge status: Home / Self Care | Payer: 59 | Attending: Emergency Medicine | Admitting: Emergency Medicine

## 2021-04-11 ENCOUNTER — Other Ambulatory Visit: Payer: Self-pay

## 2021-04-11 DIAGNOSIS — F17210 Nicotine dependence, cigarettes, uncomplicated: Secondary | ICD-10-CM | POA: Diagnosis not present

## 2021-04-11 DIAGNOSIS — R197 Diarrhea, unspecified: Secondary | ICD-10-CM | POA: Diagnosis not present

## 2021-04-11 DIAGNOSIS — R1110 Vomiting, unspecified: Secondary | ICD-10-CM | POA: Insufficient documentation

## 2021-04-11 DIAGNOSIS — G8929 Other chronic pain: Secondary | ICD-10-CM | POA: Insufficient documentation

## 2021-04-11 DIAGNOSIS — R109 Unspecified abdominal pain: Secondary | ICD-10-CM | POA: Insufficient documentation

## 2021-04-11 DIAGNOSIS — K219 Gastro-esophageal reflux disease without esophagitis: Secondary | ICD-10-CM | POA: Diagnosis not present

## 2021-04-11 MED ORDER — SODIUM CHLORIDE 0.9 % IV BOLUS
500.0000 mL | Freq: Once | INTRAVENOUS | Status: AC
  Administered 2021-04-11: 500 mL via INTRAVENOUS

## 2021-04-11 MED ORDER — ONDANSETRON HCL 4 MG/2ML IJ SOLN
4.0000 mg | Freq: Once | INTRAMUSCULAR | Status: DC
  Administered 2021-04-11: 4 mg via INTRAVENOUS
  Filled 2021-04-11: qty 2

## 2021-04-11 MED ORDER — DICYCLOMINE HCL 10 MG/ML IM SOLN
10.0000 mg | Freq: Once | INTRAMUSCULAR | Status: AC
  Administered 2021-04-11: 10 mg via INTRAMUSCULAR
  Filled 2021-04-11: qty 2

## 2021-04-11 MED ORDER — METOCLOPRAMIDE HCL 5 MG/ML IJ SOLN
10.0000 mg | Freq: Once | INTRAMUSCULAR | Status: AC
  Administered 2021-04-11: 10 mg via INTRAVENOUS
  Filled 2021-04-11: qty 2
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
## 2021-04-11 NOTE — ED Triage Notes (Signed)
Patient is A&Ox4. Tenderness and pain RLQ. Reports blood in his stool. Which is normal when he has a flare up. Hx: chronic abdominal pain.    112/63- 55-18-98%

## 2021-06-14 ENCOUNTER — Encounter (HOSPITAL_COMMUNITY): Payer: Self-pay

## 2021-06-14 ENCOUNTER — Emergency Department (HOSPITAL_COMMUNITY)
Admission: EM | Admit: 2021-06-14 | Discharge: 2021-06-14 | Disposition: A | Discharge status: Home / Self Care | Payer: 59 | Attending: Emergency Medicine | Admitting: Emergency Medicine

## 2021-06-14 ENCOUNTER — Other Ambulatory Visit: Payer: Self-pay

## 2021-06-14 DIAGNOSIS — F17210 Nicotine dependence, cigarettes, uncomplicated: Secondary | ICD-10-CM | POA: Insufficient documentation

## 2021-06-14 DIAGNOSIS — R1084 Generalized abdominal pain: Secondary | ICD-10-CM

## 2021-06-14 DIAGNOSIS — R1110 Vomiting, unspecified: Secondary | ICD-10-CM | POA: Diagnosis not present

## 2021-06-14 LAB — COMPREHENSIVE METABOLIC PANEL
ALT: 21 U/L (ref 0–44)
AST: 23 U/L (ref 15–41)
Albumin: 4.1 g/dL (ref 3.5–5.0)
Alkaline Phosphatase: 52 U/L (ref 38–126)
Anion gap: 8 (ref 5–15)
BUN: 9 mg/dL (ref 6–20)
CO2: 23 mmol/L (ref 22–32)
Calcium: 9.4 mg/dL (ref 8.9–10.3)
Chloride: 105 mmol/L (ref 98–111)
Creatinine, Ser: 0.99 mg/dL (ref 0.61–1.24)
GFR, Estimated: >60 mL/min (ref >60)
Glucose, Bld: 90 mg/dL (ref 70–99)
Potassium: 3.8 mmol/L (ref 3.5–5.1)
Sodium: 136 mmol/L (ref 135–145)
Total Bilirubin: 1 mg/dL (ref 0.3–1.2)
Total Protein: 7.2 g/dL (ref 6.5–8.1)

## 2021-06-14 LAB — CBC WITH DIFFERENTIAL/PLATELET
Abs Immature Granulocytes: 0.04 10*3/uL (ref 0.00–0.07)
Basophils Absolute: 0 10*3/uL (ref 0.0–0.1)
Basophils Relative: 1 %
Eosinophils Absolute: 0.2 10*3/uL (ref 0.0–0.5)
Eosinophils Relative: 2 %
HCT: 42 % (ref 39.0–52.0)
Hemoglobin: 14.4 g/dL (ref 13.0–17.0)
Immature Granulocytes: 1 %
Lymphocytes Relative: 36 %
Lymphs Abs: 2.4 10*3/uL (ref 0.7–4.0)
MCH: 32.5 pg (ref 26.0–34.0)
MCHC: 34.3 g/dL (ref 30.0–36.0)
MCV: 94.8 fL (ref 80.0–100.0)
Monocytes Absolute: 0.4 10*3/uL (ref 0.1–1.0)
Monocytes Relative: 7 %
Neutro Abs: 3.6 10*3/uL (ref 1.7–7.7)
Neutrophils Relative %: 53 %
Platelets: 226 10*3/uL (ref 150–400)
RBC: 4.43 MIL/uL (ref 4.22–5.81)
RDW: 13.4 % (ref 11.5–15.5)
WBC: 6.6 10*3/uL (ref 4.0–10.5)
nRBC: 0 % (ref 0.0–0.2)

## 2021-06-14 LAB — LIPASE, BLOOD: Lipase: 26 U/L (ref 11–51)

## 2021-06-14 MED ORDER — DICYCLOMINE HCL 10 MG PO CAPS
10.0000 mg | ORAL_CAPSULE | Freq: Once | ORAL | Status: AC
  Administered 2021-06-14: 10 mg via ORAL
  Filled 2021-06-14: qty 1

## 2021-06-14 MED ORDER — DICYCLOMINE HCL 10 MG/ML IM SOLN: 20.0000 mg | Freq: Once | INTRAMUSCULAR | Status: DC

## 2021-06-14 MED ORDER — SODIUM CHLORIDE 0.9 % IV BOLUS
1000.0000 mL | Freq: Once | INTRAVENOUS | Status: AC
  Administered 2021-06-14: 1000 mL via INTRAVENOUS

## 2021-06-14 MED ORDER — KETOROLAC TROMETHAMINE 30 MG/ML IJ SOLN
30.0000 mg | Freq: Once | INTRAMUSCULAR | Status: AC
  Administered 2021-06-14: 30 mg via INTRAVENOUS
  Filled 2021-06-14: qty 1

## 2021-06-14 NOTE — Discharge Instructions (Addendum)
You were seen today for abdominal pain.  Please continue to take your medicine as prescribed.  Please follow-up with your PCP later this week or next week for follow-up.  If condition change or worsen please return back to the ED for further evaluation.
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
## 2021-06-14 NOTE — ED Triage Notes (Signed)
Pt c/o abdominal pain- pt has hx of this abd pain for several years, hx of hernia and pain is around that area. Pt denies n/v/d, c/o "gagging". Pt A&O X4.  100/72 HR 70 98% ra 14rr Cbg 99

## 2021-06-14 NOTE — ED Notes (Signed)
Brought pt snack, coffee and gave pt bus pass. Pt ambulates independently, steady gait, A&O X4

## 2021-06-22 ENCOUNTER — Other Ambulatory Visit (HOSPITAL_COMMUNITY): Payer: Self-pay | Admitting: Gastroenterology

## 2021-06-22 ENCOUNTER — Other Ambulatory Visit: Payer: Self-pay | Admitting: Gastroenterology

## 2021-06-22 DIAGNOSIS — K449 Diaphragmatic hernia without obstruction or gangrene: Secondary | ICD-10-CM

## 2021-06-28 ENCOUNTER — Ambulatory Visit (HOSPITAL_COMMUNITY)
Admission: RE | Admit: 2021-06-28 | Discharge: 2021-06-28 | Disposition: A | Discharge status: Home / Self Care | Payer: 59 | Source: Ambulatory Visit | Attending: Gastroenterology | Admitting: Gastroenterology

## 2021-06-28 ENCOUNTER — Other Ambulatory Visit: Payer: Self-pay

## 2021-06-28 ENCOUNTER — Other Ambulatory Visit (HOSPITAL_COMMUNITY): Payer: Self-pay | Admitting: Gastroenterology

## 2021-06-28 DIAGNOSIS — K449 Diaphragmatic hernia without obstruction or gangrene: Secondary | ICD-10-CM | POA: Diagnosis not present

## 2022-05-15 ENCOUNTER — Emergency Department (HOSPITAL_COMMUNITY)
Admission: EM | Admit: 2022-05-15 | Discharge: 2022-05-15 | Discharge status: Against Medical Advice | Payer: Medicare Other | Attending: Student | Admitting: Student

## 2022-05-15 ENCOUNTER — Encounter (HOSPITAL_COMMUNITY): Payer: Self-pay | Admitting: Emergency Medicine

## 2022-05-15 DIAGNOSIS — Z5321 Procedure and treatment not carried out due to patient leaving prior to being seen by health care provider: Secondary | ICD-10-CM | POA: Diagnosis not present

## 2022-05-15 DIAGNOSIS — M545 Low back pain, unspecified: Secondary | ICD-10-CM | POA: Insufficient documentation
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
## 2023-05-04 ENCOUNTER — Other Ambulatory Visit: Payer: Self-pay | Admitting: Family Medicine

## 2023-05-04 DIAGNOSIS — R109 Unspecified abdominal pain: Secondary | ICD-10-CM

## 2023-05-21 ENCOUNTER — Other Ambulatory Visit: Payer: Self-pay

## 2024-02-20 ENCOUNTER — Other Ambulatory Visit: Payer: Self-pay | Admitting: Family Medicine

## 2024-02-20 DIAGNOSIS — N6320 Unspecified lump in the left breast, unspecified quadrant: Secondary | ICD-10-CM

## 2024-03-30 ENCOUNTER — Encounter (HOSPITAL_COMMUNITY): Payer: Self-pay

## 2024-03-30 ENCOUNTER — Other Ambulatory Visit: Payer: Self-pay

## 2024-03-30 ENCOUNTER — Emergency Department (HOSPITAL_COMMUNITY)
Admission: EM | Admit: 2024-03-30 | Discharge: 2024-03-31 | Disposition: A | Discharge status: Home / Self Care | Attending: Emergency Medicine | Admitting: Emergency Medicine

## 2024-03-30 DIAGNOSIS — G44019 Episodic cluster headache, not intractable: Secondary | ICD-10-CM | POA: Insufficient documentation

## 2024-03-30 DIAGNOSIS — R519 Headache, unspecified: Secondary | ICD-10-CM | POA: Diagnosis present

## 2024-03-30 MED ORDER — TETRACAINE HCL 0.5 % OP SOLN
2.0000 [drp] | Freq: Once | OPHTHALMIC | Status: AC
  Administered 2024-03-31: 2 [drp] via OPHTHALMIC
  Filled 2024-03-30: qty 4

## 2024-03-30 MED ORDER — FLUORESCEIN SODIUM 1 MG OP STRP
1.0000 | ORAL_STRIP | Freq: Once | OPHTHALMIC | Status: AC
  Administered 2024-03-31: 1 via OPHTHALMIC
  Filled 2024-03-30: qty 1

## 2024-03-30 MED ORDER — HYDROCODONE-ACETAMINOPHEN 5-325 MG PO TABS
1.0000 | ORAL_TABLET | Freq: Once | ORAL | Status: AC
  Administered 2024-03-30: 1 via ORAL
  Filled 2024-03-30: qty 1

## 2024-03-30 NOTE — ED Triage Notes (Signed)
 Patient BIB [facility] from home due to headache. Patient states pain started at 2100. Patient denies hx of headaches or migraines. Endorses light sensitivity. Rates pain 8/10. No focal deficits. A&Ox4.

## 2024-03-31 ENCOUNTER — Emergency Department (HOSPITAL_COMMUNITY)

## 2024-03-31 DIAGNOSIS — G44019 Episodic cluster headache, not intractable: Secondary | ICD-10-CM | POA: Diagnosis not present

## 2024-03-31 LAB — BASIC METABOLIC PANEL WITH GFR
Anion gap: 8 (ref 5–15)
BUN: 13 mg/dL (ref 6–20)
CO2: 23 mmol/L (ref 22–32)
Calcium: 9.2 mg/dL (ref 8.9–10.3)
Chloride: 106 mmol/L (ref 98–111)
Creatinine, Ser: 1 mg/dL (ref 0.61–1.24)
GFR, Estimated: >60 mL/min (ref >60)
Glucose, Bld: 114 mg/dL — ABNORMAL HIGH (ref 70–99)
Potassium: 4.3 mmol/L (ref 3.5–5.1)
Sodium: 137 mmol/L (ref 135–145)

## 2024-03-31 LAB — CBC WITH DIFFERENTIAL/PLATELET
Abs Immature Granulocytes: 0.03 10*3/uL (ref 0.00–0.07)
Basophils Absolute: 0.1 10*3/uL (ref 0.0–0.1)
Basophils Relative: 1 %
Eosinophils Absolute: 0.2 10*3/uL (ref 0.0–0.5)
Eosinophils Relative: 3 %
HCT: 37.3 % — ABNORMAL LOW (ref 39.0–52.0)
Hemoglobin: 12.6 g/dL — ABNORMAL LOW (ref 13.0–17.0)
Immature Granulocytes: 0 %
Lymphocytes Relative: 36 %
Lymphs Abs: 2.6 10*3/uL (ref 0.7–4.0)
MCH: 32.2 pg (ref 26.0–34.0)
MCHC: 33.8 g/dL (ref 30.0–36.0)
MCV: 95.4 fL (ref 80.0–100.0)
Monocytes Absolute: 0.6 10*3/uL (ref 0.1–1.0)
Monocytes Relative: 9 %
Neutro Abs: 3.7 10*3/uL (ref 1.7–7.7)
Neutrophils Relative %: 51 %
Platelets: 193 10*3/uL (ref 150–400)
RBC: 3.91 MIL/uL — ABNORMAL LOW (ref 4.22–5.81)
RDW: 13.7 % (ref 11.5–15.5)
WBC: 7.1 10*3/uL (ref 4.0–10.5)
nRBC: 0 % (ref 0.0–0.2)

## 2024-03-31 LAB — SEDIMENTATION RATE: Sed Rate: 6 mm/h (ref 0–16)

## 2024-03-31 MED ORDER — IOHEXOL 350 MG/ML SOLN
75.0000 mL | Freq: Once | INTRAVENOUS | Status: AC | PRN
  Administered 2024-03-31: 75 mL via INTRAVENOUS

## 2024-03-31 MED ORDER — METOCLOPRAMIDE HCL 5 MG/ML IJ SOLN
10.0000 mg | Freq: Once | INTRAMUSCULAR | Status: AC
  Administered 2024-03-31: 10 mg via INTRAVENOUS
  Filled 2024-03-31: qty 2

## 2024-03-31 MED ORDER — DIPHENHYDRAMINE HCL 50 MG/ML IJ SOLN
25.0000 mg | Freq: Once | INTRAMUSCULAR | Status: AC
  Administered 2024-03-31: 25 mg via INTRAVENOUS
  Filled 2024-03-31: qty 1
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
## 2024-07-17 ENCOUNTER — Ambulatory Visit: Payer: Self-pay | Admitting: General Surgery

## 2024-07-21 ENCOUNTER — Encounter (HOSPITAL_COMMUNITY): Payer: Self-pay | Admitting: General Surgery

## 2024-07-22 ENCOUNTER — Other Ambulatory Visit: Payer: Self-pay

## 2024-07-22 ENCOUNTER — Encounter (HOSPITAL_COMMUNITY): Payer: Self-pay | Admitting: General Surgery
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
## 2024-07-22 NOTE — Progress Notes (Signed)
 SDW call  Patient was given pre-op instructions over the phone. Patient verbalized understanding of instructions provided.     PCP - Dr. Leni Edyth Fairly Cardiologist -  Pulmonary:    PPM/ICD - denies Device Orders - na Rep Notified - na   Chest x-ray - na EKG -  na Stress Test - ECHO -  03/08/2021 Cardiac Cath -   Sleep Study/sleep apnea/CPAP: denies  Non-diabetic  Blood Thinner Instructions: denies Aspirin Instructions:denies   ERAS Protcol - Clears until 0530   Anesthesia review: No   Patient denies shortness of breath, fever, cough and chest pain over the phone call  Your procedure is scheduled on Wednesday July 23, 2024  Report to [facility] Main Entrance A at  0630  A.M., then check in with the Admitting office.  Call this number if you have problems the morning of surgery:  [phone]   If you have any questions prior to your surgery date call [phone]: Open Monday-Friday 8am-4pm If you experience any cold or flu symptoms such as cough, fever, chills, shortness of breath, etc. between now and your scheduled surgery, please notify us  at the above number     Remember:  Do not eat after midnight the night before your surgery  You may drink clear liquids until  0530 the morning of your surgery.   Clear liquids allowed are: Water, Non-Citrus Juices (without pulp), Carbonated Beverages, Clear Tea, Black Coffee ONLY (NO MILK, CREAM OR POWDERED CREAMER of any kind), and Gatorade   Take these medicines the morning of surgery with A SIP OF WATER:  None  As of today, STOP taking any Aspirin (unless otherwise instructed by your surgeon) Aleve , Naproxen , Ibuprofen , Motrin , Advil , Goody's, BC's, all herbal medications, fish oil, and all vitamins.

## 2024-07-23 ENCOUNTER — Encounter (HOSPITAL_COMMUNITY): Payer: Self-pay | Admitting: Anesthesiology

## 2024-07-23 ENCOUNTER — Encounter (HOSPITAL_COMMUNITY): Admission: RE | Payer: Self-pay | Source: Home / Self Care

## 2024-07-23 ENCOUNTER — Ambulatory Visit (HOSPITAL_COMMUNITY): Admission: RE | Admit: 2024-07-23 | Source: Home / Self Care | Admitting: General Surgery

## 2024-07-23 SURGERY — REPAIR, HERNIA, INGUINAL, ADULT
Anesthesia: General | Laterality: Left

## 2024-08-19 ENCOUNTER — Encounter (HOSPITAL_BASED_OUTPATIENT_CLINIC_OR_DEPARTMENT_OTHER): Payer: Self-pay | Admitting: General Surgery

## 2024-08-23 NOTE — Anesthesia Preprocedure Evaluation (Signed)
 Anesthesia Evaluation  Patient identified by MRN, date of birth, ID band Patient awake    Reviewed: Allergy & Precautions, NPO status , Patient's Chart, lab work & pertinent test results  Airway Mallampati: II  TM Distance: >3 FB Neck ROM: Full    Dental  (+) Missing, Dental Advisory Given, Poor Dentition   Pulmonary Current Smoker Cigarettes 2-3/d   Pulmonary exam normal breath sounds clear to auscultation       Cardiovascular Normal cardiovascular exam Rhythm:Regular Rate:Normal  Echo 2022  1. Left ventricular ejection fraction, by estimation, is 60 to 65%. The  left ventricle has normal function. The left ventricle has no regional  wall motion abnormalities. Left ventricular diastolic parameters were  normal. The average left ventricular  global longitudinal strain is -21.8 %. The global longitudinal strain is  normal.   2. Right ventricular systolic function is normal. The right ventricular  size is normal. There is normal pulmonary artery systolic pressure.   3. The mitral valve is normal in structure. Trivial mitral valve  regurgitation. No evidence of mitral stenosis.   4. The aortic valve is normal in structure. Aortic valve regurgitation is  not visualized. No aortic stenosis is present.   5. The inferior vena cava is normal in size with greater than 50%  respiratory variability, suggesting right atrial pressure of 3 mmHg.      Neuro/Psych  Headaches  negative psych ROS   GI/Hepatic ,GERD  Controlled,,(+)     substance abuse (daily marijuana)  marijuana use  Endo/Other  negative endocrine ROS    Renal/GU negative Renal ROS  negative genitourinary   Musculoskeletal negative musculoskeletal ROS (+)    Abdominal   Peds  Hematology negative hematology ROS (+)   Anesthesia Other Findings   Reproductive/Obstetrics negative OB ROS                              Anesthesia  Physical Anesthesia Plan  ASA: 2  Anesthesia Plan: General and Regional   Post-op Pain Management: Tylenol  PO (pre-op)*, Toradol  IV (intra-op)*, Precedex , Ketamine  IV*, Dilaudid  IV and Regional block*   Induction: Intravenous  PONV Risk Score and Plan: 1 and Ondansetron , Dexamethasone , Scopolamine patch - Pre-op, Midazolam  and Treatment may vary due to age or medical condition  Airway Management Planned: Oral ETT  Additional Equipment: None  Intra-op Plan:   Post-operative Plan: Extubation in OR  Informed Consent: I have reviewed the patients History and Physical, chart, labs and discussed the procedure including the risks, benefits and alternatives for the proposed anesthesia with the patient or authorized representative who has indicated his/her understanding and acceptance.     Dental advisory given  Plan Discussed with: CRNA  Anesthesia Plan Comments:          Anesthesia Quick Evaluation

## 2024-08-26 ENCOUNTER — Encounter (HOSPITAL_BASED_OUTPATIENT_CLINIC_OR_DEPARTMENT_OTHER): Payer: Self-pay | Admitting: General Surgery

## 2024-08-26 ENCOUNTER — Encounter (HOSPITAL_BASED_OUTPATIENT_CLINIC_OR_DEPARTMENT_OTHER): Admitting: Anesthesiology

## 2024-08-26 ENCOUNTER — Other Ambulatory Visit: Payer: Self-pay

## 2024-08-26 ENCOUNTER — Ambulatory Visit (HOSPITAL_BASED_OUTPATIENT_CLINIC_OR_DEPARTMENT_OTHER): Admitting: Anesthesiology

## 2024-08-26 ENCOUNTER — Encounter (HOSPITAL_BASED_OUTPATIENT_CLINIC_OR_DEPARTMENT_OTHER)
Admission: RE | Disposition: A | Discharge status: Home / Self Care | Payer: Self-pay | Source: Home / Self Care | Attending: General Surgery

## 2024-08-26 ENCOUNTER — Ambulatory Visit (HOSPITAL_BASED_OUTPATIENT_CLINIC_OR_DEPARTMENT_OTHER)
Admission: RE | Admit: 2024-08-26 | Discharge: 2024-08-26 | Disposition: A | Discharge status: Home / Self Care | Attending: General Surgery | Admitting: General Surgery

## 2024-08-26 DIAGNOSIS — K219 Gastro-esophageal reflux disease without esophagitis: Secondary | ICD-10-CM | POA: Diagnosis not present

## 2024-08-26 DIAGNOSIS — Z01818 Encounter for other preprocedural examination: Secondary | ICD-10-CM

## 2024-08-26 DIAGNOSIS — F17210 Nicotine dependence, cigarettes, uncomplicated: Secondary | ICD-10-CM | POA: Diagnosis not present

## 2024-08-26 DIAGNOSIS — K4090 Unilateral inguinal hernia, without obstruction or gangrene, not specified as recurrent: Secondary | ICD-10-CM

## 2024-08-26 HISTORY — PX: INGUINAL HERNIA REPAIR: SHX194

## 2024-08-26 SURGERY — REPAIR, HERNIA, INGUINAL, ADULT
Anesthesia: Regional | Site: Inguinal | Laterality: Left

## 2024-08-26 MED ORDER — KETOROLAC TROMETHAMINE 30 MG/ML IJ SOLN: 30.0000 mg | Freq: Once | INTRAMUSCULAR | Status: DC | PRN

## 2024-08-26 MED ORDER — AMISULPRIDE (ANTIEMETIC) 5 MG/2ML IV SOLN: 10.0000 mg | Freq: Once | INTRAVENOUS | Status: DC | PRN

## 2024-08-26 MED ORDER — ONDANSETRON HCL 4 MG/2ML IJ SOLN: 4.0000 mg | Freq: Once | INTRAMUSCULAR | Status: DC | PRN

## 2024-08-26 MED ORDER — ACETAMINOPHEN 500 MG PO TABS
ORAL_TABLET | ORAL | Status: AC
Start: 2024-08-26 — End: 2024-08-26
  Filled 2024-08-26: qty 2

## 2024-08-26 MED ORDER — GABAPENTIN 300 MG PO CAPS
300.0000 mg | ORAL_CAPSULE | ORAL | Status: AC
Start: 2024-08-26 — End: 2024-08-26
  Administered 2024-08-26: 300 mg via ORAL

## 2024-08-26 MED ORDER — SODIUM CHLORIDE 0.9% FLUSH: 3.0000 mL | Freq: Two times a day (BID) | INTRAVENOUS | Status: DC

## 2024-08-26 MED ORDER — LIDOCAINE 2% (20 MG/ML) 5 ML SYRINGE
INTRAMUSCULAR | Status: DC | PRN
  Administered 2024-08-26: 60 mg via INTRAVENOUS

## 2024-08-26 MED ORDER — CELECOXIB 200 MG PO CAPS
200.0000 mg | ORAL_CAPSULE | ORAL | Status: DC
Start: 2024-08-26 — End: 2024-08-26

## 2024-08-26 MED ORDER — CHLORHEXIDINE GLUCONATE CLOTH 2 % EX PADS: 6.0000 | MEDICATED_PAD | Freq: Once | CUTANEOUS | Status: DC

## 2024-08-26 MED ORDER — PROPOFOL 10 MG/ML IV BOLUS
INTRAVENOUS | Status: AC
  Filled 2024-08-26: qty 20

## 2024-08-26 MED ORDER — DEXAMETHASONE SODIUM PHOSPHATE 10 MG/ML IJ SOLN
INTRAMUSCULAR | Status: AC
  Filled 2024-08-26: qty 1

## 2024-08-26 MED ORDER — CELECOXIB 200 MG PO CAPS
200.0000 mg | ORAL_CAPSULE | ORAL | Status: AC
  Administered 2024-08-26: 200 mg via ORAL

## 2024-08-26 MED ORDER — MEPERIDINE HCL 25 MG/ML IJ SOLN: 6.2500 mg | INTRAMUSCULAR | Status: DC | PRN

## 2024-08-26 MED ORDER — FENTANYL CITRATE (PF) 100 MCG/2ML IJ SOLN
INTRAMUSCULAR | Status: AC
  Filled 2024-08-26: qty 2

## 2024-08-26 MED ORDER — ACETAMINOPHEN 500 MG PO TABS: 1000.0000 mg | ORAL_TABLET | ORAL | Status: AC

## 2024-08-26 MED ORDER — MIDAZOLAM HCL 2 MG/2ML IJ SOLN
INTRAMUSCULAR | Status: AC
  Filled 2024-08-26: qty 2

## 2024-08-26 MED ORDER — HYDROMORPHONE HCL 1 MG/ML IJ SOLN: 0.2500 mg | INTRAMUSCULAR | Status: DC | PRN

## 2024-08-26 MED ORDER — IBUPROFEN 200 MG PO TABS: 600.0000 mg | ORAL_TABLET | Freq: Four times a day (QID) | ORAL | 0 refills | Status: AC

## 2024-08-26 MED ORDER — ACETAMINOPHEN 325 MG PO TABS: 650.0000 mg | ORAL_TABLET | ORAL | Status: DC | PRN

## 2024-08-26 MED ORDER — ACETAMINOPHEN 500 MG PO TABS
1000.0000 mg | ORAL_TABLET | Freq: Once | ORAL | Status: AC
  Administered 2024-08-26: 1000 mg via ORAL

## 2024-08-26 MED ORDER — CEFAZOLIN SODIUM-DEXTROSE 2-4 GM/100ML-% IV SOLN
2.0000 g | INTRAVENOUS | Status: AC
  Administered 2024-08-26: 2 g via INTRAVENOUS

## 2024-08-26 MED ORDER — DEXMEDETOMIDINE HCL IN NACL 80 MCG/20ML IV SOLN
INTRAVENOUS | Status: DC | PRN
  Administered 2024-08-26: 8 ug via INTRAVENOUS
  Administered 2024-08-26: 20 ug via INTRAVENOUS

## 2024-08-26 MED ORDER — FENTANYL CITRATE (PF) 100 MCG/2ML IJ SOLN
INTRAMUSCULAR | Status: DC | PRN
  Administered 2024-08-26 (×4): 25 ug via INTRAVENOUS
  Administered 2024-08-26 (×2): 50 ug via INTRAVENOUS

## 2024-08-26 MED ORDER — PROPOFOL 10 MG/ML IV BOLUS
INTRAVENOUS | Status: DC | PRN
  Administered 2024-08-26: 200 mg via INTRAVENOUS

## 2024-08-26 MED ORDER — OXYCODONE HCL 5 MG PO TABS: 5.0000 mg | ORAL_TABLET | Freq: Once | ORAL | Status: DC | PRN

## 2024-08-26 MED ORDER — DEXAMETHASONE SODIUM PHOSPHATE 10 MG/ML IJ SOLN
INTRAMUSCULAR | Status: DC | PRN
  Administered 2024-08-26: 10 mg via INTRAVENOUS

## 2024-08-26 MED ORDER — OXYCODONE HCL 5 MG PO TABS: 5.0000 mg | ORAL_TABLET | ORAL | Status: DC | PRN

## 2024-08-26 MED ORDER — FENTANYL CITRATE (PF) 100 MCG/2ML IJ SOLN
100.0000 ug | Freq: Once | INTRAMUSCULAR | Status: AC
  Administered 2024-08-26: 100 ug via INTRAVENOUS

## 2024-08-26 MED ORDER — KETAMINE HCL 50 MG/5ML IJ SOSY
PREFILLED_SYRINGE | INTRAMUSCULAR | Status: DC | PRN
  Administered 2024-08-26: 30 mg via INTRAVENOUS

## 2024-08-26 MED ORDER — ACETAMINOPHEN 325 MG PO TABS: 650.0000 mg | ORAL_TABLET | Freq: Four times a day (QID) | ORAL | 0 refills | Status: AC

## 2024-08-26 MED ORDER — ONDANSETRON HCL 4 MG/2ML IJ SOLN
INTRAMUSCULAR | Status: AC
  Filled 2024-08-26: qty 2

## 2024-08-26 MED ORDER — KETAMINE HCL 50 MG/5ML IJ SOSY
PREFILLED_SYRINGE | INTRAMUSCULAR | Status: AC
Start: 2024-08-26 — End: 2024-08-26
  Filled 2024-08-26: qty 5

## 2024-08-26 MED ORDER — GABAPENTIN 300 MG PO CAPS
ORAL_CAPSULE | ORAL | Status: AC
  Filled 2024-08-26: qty 1

## 2024-08-26 MED ORDER — SODIUM CHLORIDE 0.9 % IV SOLN: 250.0000 mL | INTRAVENOUS | Status: DC | PRN

## 2024-08-26 MED ORDER — MIDAZOLAM HCL 2 MG/2ML IJ SOLN
2.0000 mg | Freq: Once | INTRAMUSCULAR | Status: AC
  Administered 2024-08-26: 2 mg via INTRAVENOUS

## 2024-08-26 MED ORDER — PHENYLEPHRINE HCL (PRESSORS) 10 MG/ML IV SOLN
INTRAVENOUS | Status: DC | PRN
  Administered 2024-08-26: 80 ug via INTRAVENOUS

## 2024-08-26 MED ORDER — KETOROLAC TROMETHAMINE 30 MG/ML IJ SOLN
INTRAMUSCULAR | Status: DC | PRN
  Administered 2024-08-26: 30 mg via INTRAVENOUS

## 2024-08-26 MED ORDER — BUPIVACAINE-EPINEPHRINE 0.25% -1:200000 IJ SOLN
INTRAMUSCULAR | Status: DC | PRN
  Administered 2024-08-26: 30 mL

## 2024-08-26 MED ORDER — SODIUM CHLORIDE 0.9% FLUSH: 3.0000 mL | INTRAVENOUS | Status: DC | PRN

## 2024-08-26 MED ORDER — 0.9 % SODIUM CHLORIDE (POUR BTL) OPTIME
TOPICAL | Status: DC | PRN
  Administered 2024-08-26: 500 mL

## 2024-08-26 MED ORDER — OXYCODONE HCL 5 MG/5ML PO SOLN: 5.0000 mg | Freq: Once | ORAL | Status: DC | PRN

## 2024-08-26 MED ORDER — CEFAZOLIN SODIUM-DEXTROSE 2-4 GM/100ML-% IV SOLN: 2.0000 g | INTRAVENOUS | Status: DC

## 2024-08-26 MED ORDER — CELECOXIB 200 MG PO CAPS
ORAL_CAPSULE | ORAL | Status: AC
  Filled 2024-08-26: qty 1

## 2024-08-26 MED ORDER — MIDAZOLAM HCL 5 MG/5ML IJ SOLN
INTRAMUSCULAR | Status: DC | PRN
  Administered 2024-08-26: 2 mg via INTRAVENOUS

## 2024-08-26 MED ORDER — ONDANSETRON HCL 4 MG/2ML IJ SOLN
INTRAMUSCULAR | Status: DC | PRN
  Administered 2024-08-26: 4 mg via INTRAVENOUS

## 2024-08-26 MED ORDER — ROPIVACAINE HCL 5 MG/ML IJ SOLN
INTRAMUSCULAR | Status: DC | PRN
Start: 2024-08-26 — End: 2024-08-26
  Administered 2024-08-26: 30 mL via PERINEURAL

## 2024-08-26 MED ORDER — FENTANYL CITRATE (PF) 100 MCG/2ML IJ SOLN: 25.0000 ug | INTRAMUSCULAR | Status: DC | PRN

## 2024-08-26 MED ORDER — ACETAMINOPHEN 325 MG RE SUPP: 650.0000 mg | RECTAL | Status: DC | PRN

## 2024-08-26 MED ORDER — CEFAZOLIN SODIUM-DEXTROSE 2-4 GM/100ML-% IV SOLN
INTRAVENOUS | Status: AC
  Filled 2024-08-26: qty 100

## 2024-08-26 MED ORDER — OXYCODONE HCL 5 MG PO TABS: 5.0000 mg | ORAL_TABLET | Freq: Three times a day (TID) | ORAL | 0 refills | Status: AC | PRN

## 2024-08-26 MED ORDER — LIDOCAINE 2% (20 MG/ML) 5 ML SYRINGE
INTRAMUSCULAR | Status: AC
  Filled 2024-08-26: qty 5

## 2024-08-26 MED ORDER — GABAPENTIN 300 MG PO CAPS
300.0000 mg | ORAL_CAPSULE | ORAL | Status: AC
Start: 2024-08-26 — End: 2024-08-26

## 2024-08-26 MED ORDER — LACTATED RINGERS IV SOLN: INTRAVENOUS | Status: DC

## 2024-08-26 MED ORDER — DEXAMETHASONE SODIUM PHOSPHATE 10 MG/ML IJ SOLN
INTRAMUSCULAR | Status: DC | PRN
  Administered 2024-08-26: 10 mg via PERINEURAL

## 2024-08-26 SURGICAL SUPPLY — 43 items
BLADE CLIPPER SURG (BLADE) IMPLANT
BLADE SURG 15 STRL LF DISP TIS (BLADE) ×1 IMPLANT
CANISTER SUCT 1200ML W/VALVE (MISCELLANEOUS) IMPLANT
CHLORAPREP W/TINT 26 (MISCELLANEOUS) ×1 IMPLANT
COVER BACK TABLE 60X90IN (DRAPES) ×1 IMPLANT
COVER MAYO STAND STRL (DRAPES) ×1 IMPLANT
DERMABOND ADVANCED .7 DNX12 (GAUZE/BANDAGES/DRESSINGS) ×1 IMPLANT
DRAIN PENROSE .5X12 LATEX STL (DRAIN) ×1 IMPLANT
DRAPE LAPAROTOMY TRNSV 102X78 (DRAPES) ×1 IMPLANT
DRAPE UTILITY XL STRL (DRAPES) ×1 IMPLANT
ELECT COATED BLADE 2.86 ST (ELECTRODE) ×1 IMPLANT
ELECTRODE REM PT RTRN 9FT ADLT (ELECTROSURGICAL) ×1 IMPLANT
GAUZE 4X4 16PLY ~~LOC~~+RFID DBL (SPONGE) IMPLANT
GAUZE SPONGE 4X4 12PLY STRL LF (GAUZE/BANDAGES/DRESSINGS) IMPLANT
GLOVE BIO SURGEON STRL SZ7 (GLOVE) ×1 IMPLANT
GLOVE BIOGEL PI IND STRL 7.5 (GLOVE) ×1 IMPLANT
GOWN STRL REUS W/ TWL LRG LVL3 (GOWN DISPOSABLE) ×2 IMPLANT
GOWN STRL REUS W/ TWL XL LVL3 (GOWN DISPOSABLE) ×1 IMPLANT
MESH OVITEX 1S PERM 6X10 6L (Mesh General) IMPLANT
NDL HYPO 25X1 1.5 SAFETY (NEEDLE) ×1 IMPLANT
NEEDLE HYPO 25X1 1.5 SAFETY (NEEDLE) ×1 IMPLANT
NS IRRIG 1000ML POUR BTL (IV SOLUTION) IMPLANT
PACK BASIN DAY SURGERY FS (CUSTOM PROCEDURE TRAY) ×1 IMPLANT
PENCIL SMOKE EVACUATOR (MISCELLANEOUS) ×1 IMPLANT
SLEEVE SCD COMPRESS KNEE MED (STOCKING) ×1 IMPLANT
SPIKE FLUID TRANSFER (MISCELLANEOUS) IMPLANT
SPONGE T-LAP 4X18 ~~LOC~~+RFID (SPONGE) ×1 IMPLANT
STRIP CLOSURE SKIN 1/2X4 (GAUZE/BANDAGES/DRESSINGS) IMPLANT
SUT MNCRL AB 4-0 PS2 18 (SUTURE) ×1 IMPLANT
SUT MON AB 4-0 PC3 18 (SUTURE) IMPLANT
SUT NOVA 0 T19/GS 22DT (SUTURE) IMPLANT
SUT PDS AB 2-0 CT2 27 (SUTURE) IMPLANT
SUT PROLENE 2 0 SH DA (SUTURE) ×2 IMPLANT
SUT SILK 0 TIES 10X30 (SUTURE) IMPLANT
SUT VIC AB 2-0 SH 27XBRD (SUTURE) IMPLANT
SUT VIC AB 3-0 54X BRD REEL (SUTURE) IMPLANT
SUT VIC AB 3-0 SH 27X BRD (SUTURE) IMPLANT
SUT VICRYL 3-0 CR8 SH (SUTURE) ×1 IMPLANT
SUT VICRYL AB 2 0 TIE (SUTURE) IMPLANT
SYR CONTROL 10ML LL (SYRINGE) ×1 IMPLANT
TOWEL GREEN STERILE FF (TOWEL DISPOSABLE) ×1 IMPLANT
TUBE CONNECTING 20X1/4 (TUBING) IMPLANT
YANKAUER SUCT BULB TIP NO VENT (SUCTIONS) IMPLANT

## 2024-08-26 NOTE — Discharge Instructions (Addendum)
 No tylenol  until 5:00 p.m. No ibuprofen  until 9:30 p.m.   Home Care After Hernia Repair   Activity  Limit activity for the first 24 hours, then you may return to normal daily activities. Returning to normal daily activities as soon as you can following surgery will enhance recovery time.  No heavy lifting pushing or pulling, anything heavier than 10 pounds (gallon of milk weighs approx. 8.8 pounds) for 4-6 weeks from surgery date.  Do not mow the lawn, use a vacuum cleaner, or do any other strenuous activities without first consulting your surgical team.  Climb stairs slowly and watch your step.  Walk as often as you feel able to increase strength and endurance.  No driving or operating heavy machinery within 24 hours of taking narcotic pain medication.  Diet  Drink plenty of fluids and eat a light meal on the night of surgery. Some patients may find their appetite is poor for a week or two after surgery. This is a normal result of the stress of surgery-your appetite will return in time.   There are no specific diet restrictions after surgery.  Dressings and Wound Care  Dermabond/Durabond (skin glue): This will usually remain in place for 10-14 days, then naturally fall off your skin. You may take a shower 24 hrs after  surgery, carefully wash, not scrub the incision site with a mild non-scented soap. Pat dry with a soft towel.  Do not pick or peel skin glue off.  You can shower and let the water fall on the dressings above. Do not soak or submerge your incision(s) in a bath tub, hot tub, or swimming pool, until your doctor says it is ok to do so or the incision(s) have completely healed, usually about 2-4 weeks.  Do not use creams, powder, salves or balms on your incision(s).  What to Expect After Surgery   Moderate discomfort controlled with medications  Minimal drainage from incision  You may feel pain in one or both shoulders. This pain comes from the gas still left in your belly  after the surgery, if you had laparoscopic surgery (several small incisions). The pain should ease over several days to a week. Ambulation will help with this pain.   Belly swelling  Feeling fatigue and weak  Constipation after surgery is common. Drink plenty fluids and eat a high fiber diet.  Swelling - In some patients might feel that their hernia has returned after surgery-DO NOT Worry this is normal. Swelling may be due to the development of a seroma. Seroma is fluid that has built up where the hernia was repaired this is a normal result after surgery and it will slowly reabsorb back into your body over the next several weeks.   (MALE PATIENTS ONLY)-esp following inguinal hernia repair, it is expected that your scrotum may be slightly swollen or tender. Along with oral NSAIDS medications you can apply ice packs, wear compression shorts, and/or elevate scrotum using a rolled up towel.  Also, a bladder catheter may have been placed during your surgery.  Because of this, it may hurt to urinate for a couple of days or you may pass some blood clots. These issues are expected and will go away after a few days. Please notify surgeon or report to Emergency Dept. if you are unable to urinate or your symptoms worsen.  Pain Control: Prescribed Non-Narcotic Pain Medication  You will be given three prescriptions.  Two of them will be for prescription strength ibuprofen  (i.e. Advil ) and  prescription strength acetaminophen  (i.e. Tylenol ).  The vast majority of patients will just need these two medications.  One prescription will be for a 'rescue' prescription of an oral narcotic (oxycodone ).  You may fill this if needed.  You will alternate taking the ibuprofen  (600mg ) every 6 hours and also the acetaminophen  (650mg ) every 6 hours so that you are taking one of those medications every 3 hours.  For example: o 0800 - take ibuprofen  600mg  o 1100 - take acetaminophen  650mg  o 1400 - take ibuprofen  600mg  o 1700 - take  acetaminophen  650mg  o Etc.  Continue taking this alternating pattern of ibuprofen  and acetaminophen  for 3 days  If you cannot take one or the other of these medications, just take the one you can every 6 hours.  If you are comfortable at night, you don't have to wake up and take a medication.  If you are still uncomfortable after taking either ibuprofen  or acetaminophen , try gentle stretching exercise and ice packs (a bag of frozen vegetables works great).  If you are still uncomfortable, you may fill the narcotic prescription of Oxycodone  and take as directed.  Once you have completed these prescriptions, your pain level should be low enough to stop taking medications altogether or just use an over the counter medication (ibuprofen  or acetaminophen ) as needed.   Pain Control: Over the Counter Medications to take as needed:  Colace/Docusate: May be prescribed by your surgeon to prevent constipation caused by the combination of narcotics, effects of anesthesia, and decreased ambulation.  Hold for loose stools or diarrhea. Take 100 mg 1-2 times a day starting tonight.   Fiber: High fiber foods, extra liquids (water 9-13 cups/day) can also assist with constipation. Examples of high fiber foods are fruit, bran. Prune juice and water are also good liquids to drink.  Milk of Magnesia/Miralax :  If constipated despite take the over the counter stool softeners, you may take Milk of Magnesia or Miralax  as directed on bottle to assist with constipation.     Pepcid /Famotidine : May be prescribed while taking naproxen  (Aleve ) or other NSAIDs such as ibuprofen  (Motrin /Advil ) to prevent stomach upset or Acid-reflux symptoms. Take 1 tablet 1-2 times a day.   **Constipation: The first bowel movement may occur anywhere between 1-5 days after surgery.  As long as you are not nauseated or not having significant abdominal pain this variation is acceptable.  Narcotic pain medications can cause constipation increasing  discomfort; early discontinuation will assist with bowel management.If constipated despite taking stool softeners, you may take Milk of Magnesia or Miralax  as directed on the bottle.     **Home medications: You may restart your home medications as directed by your respective Primary Care Physician or Surgeon.  When to notify your Doctor or Healthcare Team:   Sign of Wound Infection   Fever over 100 degrees.  Wound becomes extremely swollen, shows red streaks, warm to the touch, and/or drainage from the incision site or foul-smelling drainage.  Wound edges separate or opens up  Bleeding or bruising   If you have bleeding, apply pressure to the site and hold the pressure firmly for 5 minutes. If the bleeding continues, apply pressure again and call 911. If the bleeding stopped, call your doctor to report it.   Call your doctor or nurse if you have increased bleeding from your site and increased bruising or a lump forms or gets larger under your skin at the site.  Unrelieved Pain    Call your doctor or nurse if  your pain gets worse or is not eased 1 hour after taking your pain medicine, or if it is severe and uncontrolled.  Nausea and Vomiting   Call your doctor or nurse if you have nausea and vomiting that continues more than 24 hours, will not let you keep medicine down and will not let you keep fluids down  Fever, Flu-like symptoms   Fever over 100 degrees and/or chills  Gastrointestinal Bleeding Symptoms    Black tarry bowel movements.  This can be normal after surgery on the stomach, but should resolve in a day or two.    Call 911 if you suddenly have signs of blood loss such as:  Vomiting blood  Fast heart rate  Feeling faint, sweaty, or blacking out  Passing bright red blood from your rectum  Blood Clot Symptoms   Tender, swollen or reddened areas in your calf muscle or thighs.  Numbness or tingling in your lower leg or calf, or at the top of your leg or groin  Skin on your leg  looks pale or blue or feels cold to touch  Chest pain or have trouble breathing, lightheadedness, fast heart rate  Sudden Onset of Symptoms    Call 911 if you suddenly have:  Leg weakness and spasm  Loss of bladder or bowel function  Seizure  Confusion, severe headache, dizziness or feeling unsteady, problems talking, difficulty swallowing, and/or numbness or muscle weakness as these could be signs of a stroke.   Follow up Appointment Your follow up appointment should be scheduled 2-3 weeks after your surgery date.  If you have not previously scheduled for a follow-up visit you can be scheduled by contacting [phone].  Post Anesthesia Home Care Instructions  Activity: Get plenty of rest for the remainder of the day. A responsible individual must stay with you for 24 hours following the procedure.  For the next 24 hours, DO NOT: -Drive a car -Advertising copywriter -Drink alcoholic beverages -Take any medication unless instructed by your physician -Make any legal decisions or sign important papers.  Meals: Start with liquid foods such as gelatin or soup. Progress to regular foods as tolerated. Avoid greasy, spicy, heavy foods. If nausea and/or vomiting occur, drink only clear liquids until the nausea and/or vomiting subsides. Call your physician if vomiting continues.  Special Instructions/Symptoms: Your throat may feel dry or sore from the anesthesia or the breathing tube placed in your throat during surgery. If this causes discomfort, gargle with warm salt water. The discomfort should disappear within 24 hours.  If you had a scopolamine patch placed behind your ear for the management of post- operative nausea and/or vomiting:  1. The medication in the patch is effective for 72 hours, after which it should be removed.  Wrap patch in a tissue and discard in the trash. Wash hands thoroughly with soap and water. 2. You may remove the patch earlier than 72 hours if you experience  unpleasant side effects which may include dry mouth, dizziness or visual disturbances. 3. Avoid touching the patch. Wash your hands with soap and water after contact with the patch.

## 2024-08-26 NOTE — Transfer of Care (Signed)
 Immediate Anesthesia Transfer of Care Note  Patient: [name]  Procedure(s) Performed: REPAIR, HERNIA, INGUINAL, ADULT WITH MESH (Left: Inguinal)  Patient Location: PACU  Anesthesia Type:General  Level of Consciousness: drowsy  Airway & Oxygen Therapy: Patient Spontanous Breathing and Patient connected to face mask oxygen  Post-op Assessment: Report given to RN and Post -op Vital signs reviewed and stable  Post vital signs: Reviewed and stable  Last Vitals:  Vitals Value Taken Time  BP 108/68 08/26/24 15:45  Temp    Pulse 56 08/26/24 15:46  Resp 13 08/26/24 15:46  SpO2 100 % 08/26/24 15:46  Vitals shown include unfiled device data.  Last Pain:  Vitals:   08/26/24 1045  TempSrc: Temporal  PainSc: 0-No pain      Patients Stated Pain Goal: 2 (08/26/24 1045)  Complications: No notable events documented.

## 2024-08-26 NOTE — Anesthesia Postprocedure Evaluation (Signed)
 Anesthesia Post Note  Patient: Ezzard Mayo Frazer  Procedure(s) Performed: REPAIR, HERNIA, INGUINAL, ADULT WITH MESH (Left: Inguinal)     Patient location during evaluation: PACU Anesthesia Type: Regional and General Level of consciousness: awake and alert, oriented and patient cooperative Pain management: pain level controlled Vital Signs Assessment: post-procedure vital signs reviewed and stable Respiratory status: spontaneous breathing, nonlabored ventilation and respiratory function stable Cardiovascular status: blood pressure returned to baseline and stable Postop Assessment: no apparent nausea or vomiting Anesthetic complications: no   No notable events documented.  Last Vitals:  Vitals:   08/26/24 1600 08/26/24 1617  BP: 102/70 120/77  Pulse: (!) 50   Resp: 12 13  Temp:    SpO2: 100%     Last Pain:  Vitals:   08/26/24 1600  TempSrc:   PainSc: Asleep                 Almarie CHRISTELLA Marchi

## 2024-08-26 NOTE — Progress Notes (Signed)
Assisted Dr. Doroteo Glassman with left, transabdominal plane, ultrasound guided block. Side rails up, monitors on throughout procedure. See vital signs in flow sheet. Tolerated Procedure well.
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
## 2024-08-26 NOTE — Anesthesia Procedure Notes (Signed)
 Procedure Name: LMA Insertion Date/Time: 08/26/2024 1:42 PM  Performed by: Claudene Delon SQUIBB, CRNAPre-anesthesia Checklist: Patient identified, Emergency Drugs available, Suction available and Patient being monitored Patient Re-evaluated:Patient Re-evaluated prior to induction Oxygen Delivery Method: Circle System Utilized Preoxygenation: Pre-oxygenation with 100% oxygen Induction Type: IV induction Ventilation: Mask ventilation without difficulty LMA: LMA inserted LMA Size: 4.0 Number of attempts: 1 Placement Confirmation: positive ETCO2 Tube secured with: Tape Dental Injury: Teeth and Oropharynx as per pre-operative assessment
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
## 2024-08-26 NOTE — Op Note (Signed)
 Post-Op Note/Post-Procedure Note  Patient: [name] MRN: [id number] DOB: 11-Nov-1966 Sex: male Operation/Procedure Date: 08/26/2024 Surgeons and Role:    * [name], Nathan LABOR, MD - Primary  Pre-operative Diagnoses: LEFT INGUINAL HERNIA Postoperative Diagnoses: Same  Side: Left Anesthesia: LMA  Indications: [name] is a 58 y.o. year old male who presents for open left inguinal hernia repair. Preoperatively, I discussed in detail the risks, benefits, alternatives, and potential complications. The patient understands and requests to proceed.  Operative Findings: Left inguinal hernia  Technique: The patient was positively identified, was taken to the operating room and placed supine on the operating table. The patient had voided prior to the procedure. The arms were carefully left out and padded. After establishment of deep sedation by the anesthesia team, the lower abdomen was prepped and draped in the usual sterile surgical fashion. A time-out was performed confirming correct patient and procedure. We identified the left anterosuperior iliac spine and the left pubic tubercle. A field block was performed using a combination of 0.25% Marcaine  plain and epinephrine . We then made a 7-cm oblique incision just above the left pubic tubercle. This was done with #15 blade and the subcutaneous tissues were divided. The expected vein in the subcutaneous space laterally was controlled using the Bovie. We then incised the Scarpa's fascia and soon encountered the external oblique aponeurosis. We identified the femoral vessels and confirmed no femoral hernia. Several milliliters of the local mixture was infiltrated below this to elevate it off the underlying cord structures. A #15 blade was used to incise the external oblique aponeurosis and this was divided obliterating the external ring. The division of the external oblique was taken up cephalad for an additional 3 cm. Care was taken not to  injure the ilioinguinal nerve and iliohypogastric nerve, which were identified and kept away from our dissection. An ilioinguinal and iliohypogastric nerve block was performed using the local mixture. We then encircled the cord structures, and controlled these with a Penrose drain. We then explored the cord by dividing the cremaster muscles. We identified the testicular vessels and the vas. The indirect sac was identified and carefully separated from the surrounding structures. The sac was incised and inspected. There was some ascites but no evidence of bowel or other organ involvement.  The sac was ligated with an 0 silk. Hemostasis was observed. We then turned our attention to exploration of the floor. There was an area of attenuated transversalis fascia, allowing some bulging of the floor just medial to the internal ring. This was imbricated with interrupted 2-0 PDS. Then, we brought [name] mesh to the field and placed this as an onlay in a modified Lichtenstein fashion using running 2-0 Prolene suture inferiorly along the shelving edge of the inguinal ligament. Superiorly, we place interrupted 2-0 PDS suture fixating the mesh to the conjoint tendon taking care not to injure or entrap nervous structures. We made a new internal ring with 3-0 Vicryl suture and made sure this was not too tight around the cord structures. We irrigated the dissection field with warm sterile saline. The tails were approximated using a single interrupted 3-0 Vicryl suture. The entire mesh and tails were tucked up under the external oblique aponeurosis. We closed the external oblique aponeurosis with running 3-0 Vicryl suture. In doing this, we created a new external ring. Scarpa's layer was closed using interrupted 3-0 Vicryl suture. In addition, we closed the deep dermal layers using interrupted 3-0 Vicryl suture. A 4-0 Monocryl running subcuticular suture  was used to close the skin. Final dressing of Dermabond was placed.  All  sponge, needle, and instrument counts were reported correct at the end of the case. We confirmed that the testicles were in their proper position in the scrotal sac at the end of the case.   Estimated Blood Loss: Minimal. Specimens: Hernia sac Implants:  Implant Name Type Inv. Item Serial No. Manufacturer Lot No. LRB No. Used Action  MESH Nathan 1S PERM 6X10 6L - [id number] Mesh General MESH Nathan 1S PERM 6X10 6L  [employer] [id number] Left 1 Implanted   Drains: None. Complications: * No complications entered in OR log * Condition of the patient: Good, emerged from sedation Disposition: PACU  Nathan [name] Date: 08/26/2024 Time: 3:43 PM

## 2024-08-27 ENCOUNTER — Encounter (HOSPITAL_BASED_OUTPATIENT_CLINIC_OR_DEPARTMENT_OTHER): Payer: Self-pay | Admitting: General Surgery

## 2024-09-12 ENCOUNTER — Emergency Department (HOSPITAL_COMMUNITY)

## 2024-09-12 ENCOUNTER — Emergency Department (HOSPITAL_COMMUNITY)
Admission: EM | Admit: 2024-09-12 | Discharge: 2024-09-13 | Discharge status: Against Medical Advice | Attending: Emergency Medicine | Admitting: Emergency Medicine

## 2024-09-12 DIAGNOSIS — R1031 Right lower quadrant pain: Secondary | ICD-10-CM | POA: Insufficient documentation

## 2024-09-12 DIAGNOSIS — R112 Nausea with vomiting, unspecified: Secondary | ICD-10-CM | POA: Insufficient documentation

## 2024-09-12 DIAGNOSIS — Z5321 Procedure and treatment not carried out due to patient leaving prior to being seen by health care provider: Secondary | ICD-10-CM | POA: Diagnosis not present

## 2024-09-12 LAB — URINALYSIS, ROUTINE W REFLEX MICROSCOPIC
Bilirubin Urine: NEGATIVE
Glucose, UA: NEGATIVE mg/dL
Hgb urine dipstick: NEGATIVE
Ketones, ur: NEGATIVE mg/dL
Leukocytes,Ua: NEGATIVE
Nitrite: NEGATIVE
Protein, ur: 30 mg/dL — AB
Specific Gravity, Urine: 1.031 — ABNORMAL HIGH (ref 1.005–1.030)
pH: 5 (ref 5.0–8.0)

## 2024-09-12 LAB — CBC
HCT: 41.4 % (ref 39.0–52.0)
Hemoglobin: 14 g/dL (ref 13.0–17.0)
MCH: 31.9 pg (ref 26.0–34.0)
MCHC: 33.8 g/dL (ref 30.0–36.0)
MCV: 94.3 fL (ref 80.0–100.0)
Platelets: 287 K/uL (ref 150–400)
RBC: 4.39 MIL/uL (ref 4.22–5.81)
RDW: 13.5 % (ref 11.5–15.5)
WBC: 8.9 K/uL (ref 4.0–10.5)
nRBC: 0 % (ref 0.0–0.2)
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
## 2024-09-13 NOTE — ED Notes (Signed)
 Patient was called 3 times to do V/S, but no answer.
